# Patient Record
Sex: Female | Born: 1935 | Race: White | Hispanic: No | Smoking: Former smoker
Health system: Southern US, Community
[De-identification: ages and names within clinical notes are randomized; demographics above are authoritative.]

## PROBLEM LIST (undated history)

## (undated) DIAGNOSIS — F32A Depression, unspecified: Secondary | ICD-10-CM

## (undated) DIAGNOSIS — K219 Gastro-esophageal reflux disease without esophagitis: Secondary | ICD-10-CM

## (undated) DIAGNOSIS — F329 Major depressive disorder, single episode, unspecified: Secondary | ICD-10-CM

## (undated) DIAGNOSIS — E079 Disorder of thyroid, unspecified: Secondary | ICD-10-CM

## (undated) DIAGNOSIS — R079 Chest pain, unspecified: Secondary | ICD-10-CM

## (undated) DIAGNOSIS — G4730 Sleep apnea, unspecified: Secondary | ICD-10-CM

## (undated) DIAGNOSIS — G500 Trigeminal neuralgia: Secondary | ICD-10-CM

## (undated) DIAGNOSIS — I10 Essential (primary) hypertension: Secondary | ICD-10-CM

## (undated) HISTORY — DX: Trigeminal neuralgia: G50.0

## (undated) HISTORY — DX: Essential (primary) hypertension: I10

## (undated) HISTORY — DX: Major depressive disorder, single episode, unspecified: F32.9

## (undated) HISTORY — DX: Disorder of thyroid, unspecified: E07.9

## (undated) HISTORY — DX: Chest pain, unspecified: R07.9

## (undated) HISTORY — PX: CATARACT EXTRACTION: SUR2

## (undated) HISTORY — DX: Gastro-esophageal reflux disease without esophagitis: K21.9

## (undated) HISTORY — PX: GALLBLADDER SURGERY: SHX652

## (undated) HISTORY — DX: Sleep apnea, unspecified: G47.30

## (undated) HISTORY — DX: Depression, unspecified: F32.A

## (undated) HISTORY — PX: TOOTH EXTRACTION: SUR596

---
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
## 2000-11-28 HISTORY — PX: TOTAL HIP ARTHROPLASTY: SHX124

## 2001-01-24 ENCOUNTER — Ambulatory Visit (HOSPITAL_COMMUNITY): Admission: RE | Admit: 2001-01-24 | Discharge: 2001-01-24 | Payer: Self-pay | Admitting: Orthopedic Surgery

## 2001-01-24 ENCOUNTER — Encounter: Payer: Self-pay | Admitting: Orthopedic Surgery

## 2001-03-08 ENCOUNTER — Ambulatory Visit (HOSPITAL_COMMUNITY): Admission: RE | Admit: 2001-03-08 | Discharge: 2001-03-08 | Payer: Self-pay | Admitting: Cardiology

## 2001-03-08 ENCOUNTER — Encounter: Payer: Self-pay | Admitting: Cardiology

## 2001-04-30 ENCOUNTER — Encounter: Payer: Self-pay | Admitting: Orthopedic Surgery

## 2001-04-30 ENCOUNTER — Inpatient Hospital Stay (HOSPITAL_COMMUNITY): Admission: RE | Admit: 2001-04-30 | Discharge: 2001-05-05 | Payer: Self-pay | Admitting: Orthopedic Surgery

## 2001-05-03 ENCOUNTER — Encounter: Payer: Self-pay | Admitting: Orthopedic Surgery

## 2002-03-07 ENCOUNTER — Ambulatory Visit (HOSPITAL_COMMUNITY): Admission: RE | Admit: 2002-03-07 | Discharge: 2002-03-07 | Payer: Self-pay | Admitting: Family Medicine

## 2002-03-07 ENCOUNTER — Encounter: Payer: Self-pay | Admitting: Family Medicine

## 2002-05-20 ENCOUNTER — Ambulatory Visit (HOSPITAL_COMMUNITY): Admission: RE | Admit: 2002-05-20 | Discharge: 2002-05-20 | Payer: Self-pay | Admitting: Family Medicine

## 2002-05-20 ENCOUNTER — Encounter: Payer: Self-pay | Admitting: Family Medicine

## 2002-06-07 ENCOUNTER — Ambulatory Visit (HOSPITAL_COMMUNITY): Admission: RE | Admit: 2002-06-07 | Discharge: 2002-06-07 | Payer: Self-pay | Admitting: Otolaryngology

## 2002-06-07 ENCOUNTER — Encounter: Payer: Self-pay | Admitting: Otolaryngology

## 2002-07-08 ENCOUNTER — Encounter: Payer: Self-pay | Admitting: Family Medicine

## 2002-07-08 ENCOUNTER — Ambulatory Visit (HOSPITAL_COMMUNITY): Admission: RE | Admit: 2002-07-08 | Discharge: 2002-07-08 | Payer: Self-pay | Admitting: Family Medicine

## 2002-07-16 ENCOUNTER — Ambulatory Visit (HOSPITAL_COMMUNITY): Admission: RE | Admit: 2002-07-16 | Discharge: 2002-07-16 | Payer: Self-pay | Admitting: Family Medicine

## 2002-07-16 ENCOUNTER — Encounter: Payer: Self-pay | Admitting: Family Medicine

## 2002-09-11 ENCOUNTER — Ambulatory Visit: Admission: RE | Admit: 2002-09-11 | Discharge: 2002-09-11 | Payer: Self-pay | Admitting: Family Medicine

## 2003-02-21 ENCOUNTER — Encounter: Payer: Self-pay | Admitting: Family Medicine

## 2003-02-21 ENCOUNTER — Ambulatory Visit (HOSPITAL_COMMUNITY): Admission: RE | Admit: 2003-02-21 | Discharge: 2003-02-21 | Payer: Self-pay | Admitting: Family Medicine

## 2003-04-18 ENCOUNTER — Encounter: Payer: Self-pay | Admitting: Internal Medicine

## 2003-04-18 ENCOUNTER — Encounter: Admission: RE | Admit: 2003-04-18 | Discharge: 2003-04-18 | Payer: Self-pay | Admitting: Internal Medicine

## 2003-04-23 ENCOUNTER — Encounter: Admission: RE | Admit: 2003-04-23 | Discharge: 2003-04-23 | Payer: Self-pay | Admitting: Internal Medicine

## 2003-04-23 ENCOUNTER — Encounter: Payer: Self-pay | Admitting: Internal Medicine

## 2003-05-08 ENCOUNTER — Encounter: Payer: Self-pay | Admitting: Internal Medicine

## 2003-05-08 ENCOUNTER — Encounter: Admission: RE | Admit: 2003-05-08 | Discharge: 2003-05-08 | Payer: Self-pay | Admitting: Internal Medicine

## 2004-06-07 ENCOUNTER — Encounter (HOSPITAL_COMMUNITY): Admission: RE | Admit: 2004-06-07 | Discharge: 2004-07-07 | Payer: Self-pay | Admitting: Orthopedic Surgery

## 2004-09-28 ENCOUNTER — Encounter: Admission: RE | Admit: 2004-09-28 | Discharge: 2004-09-28 | Payer: Self-pay | Admitting: Internal Medicine

## 2004-10-08 ENCOUNTER — Other Ambulatory Visit: Admission: RE | Admit: 2004-10-08 | Discharge: 2004-10-08 | Payer: Self-pay | Admitting: Internal Medicine

## 2004-10-13 ENCOUNTER — Encounter: Admission: RE | Admit: 2004-10-13 | Discharge: 2004-10-13 | Payer: Self-pay | Admitting: Internal Medicine

## 2004-11-28 HISTORY — PX: KNEE ARTHROPLASTY: SHX992

## 2004-12-08 ENCOUNTER — Observation Stay (HOSPITAL_COMMUNITY): Admission: EM | Admit: 2004-12-08 | Discharge: 2004-12-09 | Payer: Self-pay

## 2004-12-20 ENCOUNTER — Encounter: Admission: RE | Admit: 2004-12-20 | Discharge: 2004-12-20 | Payer: Self-pay | Admitting: Family Medicine

## 2005-01-10 ENCOUNTER — Ambulatory Visit (HOSPITAL_COMMUNITY): Admission: RE | Admit: 2005-01-10 | Discharge: 2005-01-10 | Payer: Self-pay | Admitting: General Surgery

## 2005-01-18 ENCOUNTER — Ambulatory Visit (HOSPITAL_COMMUNITY): Admission: RE | Admit: 2005-01-18 | Discharge: 2005-01-18 | Payer: Self-pay | Admitting: Cardiology

## 2005-02-09 ENCOUNTER — Encounter (INDEPENDENT_AMBULATORY_CARE_PROVIDER_SITE_OTHER): Payer: Self-pay | Admitting: Specialist

## 2005-02-09 ENCOUNTER — Observation Stay (HOSPITAL_COMMUNITY): Admission: RE | Admit: 2005-02-09 | Discharge: 2005-02-10 | Payer: Self-pay | Admitting: General Surgery

## 2005-04-13 ENCOUNTER — Inpatient Hospital Stay (HOSPITAL_COMMUNITY): Admission: RE | Admit: 2005-04-13 | Discharge: 2005-04-16 | Payer: Self-pay | Admitting: Orthopedic Surgery

## 2005-05-09 ENCOUNTER — Encounter (HOSPITAL_COMMUNITY): Admission: RE | Admit: 2005-05-09 | Discharge: 2005-06-08 | Payer: Self-pay | Admitting: Orthopedic Surgery

## 2005-09-07 ENCOUNTER — Ambulatory Visit: Payer: Self-pay | Admitting: Critical Care Medicine

## 2005-09-07 ENCOUNTER — Inpatient Hospital Stay (HOSPITAL_COMMUNITY): Admission: RE | Admit: 2005-09-07 | Discharge: 2005-09-10 | Payer: Self-pay | Admitting: Orthopedic Surgery

## 2005-10-04 ENCOUNTER — Encounter (HOSPITAL_COMMUNITY): Admission: RE | Admit: 2005-10-04 | Discharge: 2005-11-03 | Payer: Self-pay | Admitting: Orthopedic Surgery

## 2005-10-25 ENCOUNTER — Ambulatory Visit (HOSPITAL_COMMUNITY): Admission: RE | Admit: 2005-10-25 | Discharge: 2005-10-25 | Payer: Self-pay | Admitting: Internal Medicine

## 2005-12-15 ENCOUNTER — Encounter (HOSPITAL_COMMUNITY): Admission: RE | Admit: 2005-12-15 | Discharge: 2006-01-14 | Payer: Self-pay | Admitting: Neurology

## 2005-12-20 ENCOUNTER — Encounter: Admission: RE | Admit: 2005-12-20 | Discharge: 2005-12-20 | Payer: Self-pay | Admitting: Neurology

## 2005-12-20 ENCOUNTER — Ambulatory Visit (HOSPITAL_COMMUNITY): Admission: RE | Admit: 2005-12-20 | Discharge: 2005-12-20 | Payer: Self-pay | Admitting: Neurology

## 2005-12-20 ENCOUNTER — Encounter (INDEPENDENT_AMBULATORY_CARE_PROVIDER_SITE_OTHER): Payer: Self-pay | Admitting: Cardiology

## 2006-11-09 ENCOUNTER — Ambulatory Visit (HOSPITAL_COMMUNITY): Admission: RE | Admit: 2006-11-09 | Discharge: 2006-11-09 | Payer: Self-pay | Admitting: Internal Medicine

## 2007-08-09 ENCOUNTER — Ambulatory Visit (HOSPITAL_COMMUNITY): Admission: RE | Admit: 2007-08-09 | Discharge: 2007-08-09 | Payer: Self-pay | Admitting: Internal Medicine

## 2007-08-16 ENCOUNTER — Ambulatory Visit (HOSPITAL_COMMUNITY): Admission: RE | Admit: 2007-08-16 | Discharge: 2007-08-16 | Payer: Self-pay | Admitting: Internal Medicine

## 2007-11-26 ENCOUNTER — Encounter: Admission: RE | Admit: 2007-11-26 | Discharge: 2007-11-26 | Payer: Self-pay | Admitting: Internal Medicine

## 2007-11-29 HISTORY — PX: SHOULDER ARTHROSCOPY: SHX128

## 2008-02-06 ENCOUNTER — Encounter: Admission: RE | Admit: 2008-02-06 | Discharge: 2008-02-06 | Payer: Self-pay | Admitting: Orthopedic Surgery

## 2008-02-28 ENCOUNTER — Encounter: Admission: RE | Admit: 2008-02-28 | Discharge: 2008-02-28 | Payer: Self-pay | Admitting: Orthopedic Surgery

## 2008-03-04 ENCOUNTER — Ambulatory Visit (HOSPITAL_BASED_OUTPATIENT_CLINIC_OR_DEPARTMENT_OTHER): Admission: RE | Admit: 2008-03-04 | Discharge: 2008-03-04 | Payer: Self-pay | Admitting: Orthopedic Surgery

## 2008-03-06 ENCOUNTER — Encounter (HOSPITAL_COMMUNITY): Admission: RE | Admit: 2008-03-06 | Discharge: 2008-04-08 | Payer: Self-pay | Admitting: Orthopedic Surgery

## 2008-11-04 ENCOUNTER — Encounter: Admission: RE | Admit: 2008-11-04 | Discharge: 2008-11-04 | Payer: Self-pay | Admitting: Cardiology

## 2008-11-25 ENCOUNTER — Encounter: Admission: RE | Admit: 2008-11-25 | Discharge: 2008-11-25 | Payer: Self-pay | Admitting: Internal Medicine

## 2009-12-29 ENCOUNTER — Encounter: Admission: RE | Admit: 2009-12-29 | Discharge: 2009-12-29 | Payer: Self-pay | Admitting: Internal Medicine

## 2010-12-16 ENCOUNTER — Other Ambulatory Visit (HOSPITAL_COMMUNITY): Payer: Self-pay | Admitting: Internal Medicine

## 2010-12-16 DIAGNOSIS — Z0000 Encounter for general adult medical examination without abnormal findings: Secondary | ICD-10-CM

## 2010-12-18 ENCOUNTER — Encounter: Payer: Self-pay | Admitting: Internal Medicine

## 2010-12-30 ENCOUNTER — Ambulatory Visit (HOSPITAL_COMMUNITY)
Admission: RE | Admit: 2010-12-30 | Discharge: 2010-12-30 | Disposition: A | Discharge status: Home / Self Care | Payer: Medicare Other | Attending: Internal Medicine | Admitting: Internal Medicine

## 2010-12-30 ENCOUNTER — Ambulatory Visit (HOSPITAL_COMMUNITY)
Admission: RE | Admit: 2010-12-30 | Discharge: 2010-12-30 | Disposition: A | Discharge status: Home / Self Care | Payer: Medicare Other | Source: Ambulatory Visit | Attending: Internal Medicine | Admitting: Internal Medicine

## 2010-12-30 DIAGNOSIS — Z0000 Encounter for general adult medical examination without abnormal findings: Secondary | ICD-10-CM

## 2010-12-30 DIAGNOSIS — Z1231 Encounter for screening mammogram for malignant neoplasm of breast: Secondary | ICD-10-CM | POA: Insufficient documentation

## 2011-02-18 ENCOUNTER — Other Ambulatory Visit: Payer: Self-pay | Admitting: Internal Medicine

## 2011-02-18 DIAGNOSIS — M549 Dorsalgia, unspecified: Secondary | ICD-10-CM

## 2011-02-21 ENCOUNTER — Ambulatory Visit
Admission: RE | Admit: 2011-02-21 | Discharge: 2011-02-21 | Disposition: A | Discharge status: Home / Self Care | Payer: Medicare Other | Source: Ambulatory Visit | Attending: Internal Medicine | Admitting: Internal Medicine

## 2011-02-21 DIAGNOSIS — M549 Dorsalgia, unspecified: Secondary | ICD-10-CM

## 2011-03-16 ENCOUNTER — Ambulatory Visit
Admission: RE | Admit: 2011-03-16 | Discharge: 2011-03-16 | Disposition: A | Discharge status: Home / Self Care | Payer: Medicare Other | Source: Ambulatory Visit | Attending: Internal Medicine | Admitting: Internal Medicine

## 2011-03-16 ENCOUNTER — Other Ambulatory Visit: Payer: Self-pay | Admitting: Internal Medicine

## 2011-03-16 DIAGNOSIS — R1012 Left upper quadrant pain: Secondary | ICD-10-CM

## 2011-04-12 NOTE — Op Note (Signed)
NAMEMALAK, DUCHESNEAU                ACCOUNT NO.:  [id number]   MEDICAL RECORD NO.:  [id number]          PATIENT TYPE:  AMB   LOCATION:  DSC                          FACILITY:  [facility]   PHYSICIAN:  Robert A. Thurston Hole, M.D. DATE OF BIRTH:  May 06, 1928   DATE OF PROCEDURE:  03/04/2008  DATE OF DISCHARGE:                               OPERATIVE REPORT   PREOPERATIVE DIAGNOSES:  1. Right shoulder partial rotator cuff tear and partial labrum tear.  2. Right shoulder chondromalacia/degenerative joint disease.  3. Right shoulder impingement.  4. Right shoulder acromioclavicular joint degenerative joint disease      and spurring.   POSTOPERATIVE DIAGNOSES:  1. Right shoulder partial rotator cuff tear and partial labrum tear.  2. Right shoulder chondromalacia/degenerative joint disease.  3. Right shoulder impingement.  4. Right shoulder acromioclavicular joint degenerative joint disease      and spurring.   PROCEDURES:  1. Right shoulder examination under anesthesia followed by      arthroscopic debridement of partial labrum tear and partial rotator      cuff tear.  2. Right shoulder chondroplasty.  3,  Right shoulder subacromial decompression.  1. Right shoulder distal clavicle excision.   SURGEON:  Elana Alm. Thurston Hole, M.D.   ASSISTANT:  Julien Girt, P.A.   ANESTHESIA:  General.   OPERATIVE TIME:  45 minutes.   COMPLICATIONS:  None.   INDICATIONS FOR PROCEDURE:  Ms. Basques is a 80-year woman who has had 3  months of increasing right shoulder pain with exam and MRI documenting  partial rotator cuff tear, partial labrum tear with chondromalacia,  impingement and AC joint arthropathy.  She has failed conservative care  and is now to undergo arthroscopy.   DESCRIPTION:  Ms. Mcinnis was brought to operating room on March 04, 2008,  placed on the operating table in supine position.  After an adequate  level of general anesthesia was obtained, her right shoulder was  examined.  She had  full range of motion and her shoulder was stable to  ligamentous exam.  The shoulder was sterilely injected with 0.25%  Marcaine with epinephrine.  She was then placed in a beach chair  position and her shoulder and arm were prepped using sterile DuraPrep  and draped using sterile technique.  Originally through a posterior  arthroscopic portal, the arthroscope with a pump attached was placed and  through an anterior portal, an arthroscopic probe was placed.  On  initial inspection the articular cartilage in the glenohumeral joint  showed 30-40% grade 4 and 50% grade 3 changes on both the glenoid and  humeral head, and this was debrided.  She had partial tearing of the  anterior, superior and posterior labrum 30-40%, which was resected back  to a stable rim.  The biceps tendon anchor was slightly hypermobile but  it was otherwise intact.  Biceps tendon was intact.  The anterior  inferior labrum and anterior inferior glenohumeral ligament complex was  intact.  Rotator cuff showed a partial tear 30% of the supraspinatus,  subscapularis and infraspinatus, which was debrided but they were  otherwise well-attached.  The teres minor was intact.  Inferior capsular  recess was free of pathology.  After this was done the subacromial space  was entered and a lateral arthroscopic portal was made.  Moderately  thickened bursitis was resected.  The rotator cuff was frayed and  partially torn on the bursal surface and this was debrided, but no  complete tear was noted.  Impingement was noted and a subacromial  decompression was carried out removing 6-8 mm of the undersurface of the  anterior, anterolateral and anteromedial acromion and CA ligament  release carried out as well.  The [facility] joint showed significant spurring  and degenerative changes and the distal 6 mm of clavicle was resected  with a 6-mm bur.  After this was done the shoulder could be brought  through a full range of motion with no  impingement on the rotator cuff.  At this point it was felt that all pathology had been satisfactorily  addressed.  The instruments were removed.  The portal was closed with 3-  0 nylon suture and injected with 0.25% Marcaine with epinephrine.  Sterile dressings and a sling applied and the patient awakened and taken  to the recovery in stable condition none.   FOLLOW-UP CARE:  Ms. Burnsed will either be followed overnight or  discharged home depending on her level of postoperative oxygenation,  which will be monitored closely by anesthesia in the recovery room.  She  will be discharged on Percocet, Robaxin and Mobic.  Begin early physical  therapy.  She her back in the office in a week for sutures out and  follow-up.      Robert A. Thurston Hole, M.D.  Electronically Signed     RAW/MEDQ  D:  03/04/2008  T:  03/05/2008  Job:  [id number]

## 2011-04-15 NOTE — Discharge Summary (Signed)
[name], [name]                ACCOUNT NO.:  [id number]   MEDICAL RECORD NO.:  [id number]          PATIENT TYPE:  OBV   LOCATION:  3731                         FACILITY:  [facility]   PHYSICIAN:  Theone Stanley, MD   DATE OF BIRTH:  May 09, 1928   DATE OF ADMISSION:  12/07/2004  DATE OF DISCHARGE:  12/09/2004                                 DISCHARGE SUMMARY   ADMITTING DIAGNOSIS:  1.  Atypical chest pain.  2.  Hypothyroidism.  3.  Hypertension.  4.  Depression.  5.  Hyperlipidemia.  6.  Gastroesophageal reflux disease.   DISCHARGE DIAGNOSES:  1.  Atypical chest pain, most likely costochondritis.  2.  Hypothyroidism.  3.  Hypertension.  4.  Depression.  5.  Hyperlipidemia.  6.  Gastroesophageal reflux disease.  7.  Probable left adrenal adenoma.   CONSULTATIONS:  None.   PROCEDURES:  CT of the abdomen, no acute abnormalities, specific no aortic  dissection, cholelithiasis, probable adrenal adenoma.  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  The superior  mesenteric artery and inferior mesenteric artery appear widely patent.  There is a single widely patent right adrenal artery and 2 on the left.  There are scattered calcifications in the descending thoracic aorta and  abdominal aorta.  The common iliacs are patent.  The scan demonstrates a 2.2  cm mass in the superior aspect of the left adrenal gland, probably benign  adenoma.  Both adrenal glands are slightly hypertrophied, probably benign  adrenal hypertrophy.  There are tiny stones in the gallbladder.  There was a  parenchymal spleen and pancreas and kidney demonstrate no significant  abnormality.   CT of the chest showed no acute disease, cardiomegaly, hiatal hernia, no  evidence of aortic dissection or pulmonary embolism.  Diffuse degenerative  joint disease throughout the thoracic and lumbar spine __________ on  reconstructed images.   HOSPITAL COURSE:  [name] is a 77 year old Caucasian female with a past  medical history of anxiety, hepatitis, hypertension, who presents with chest  pain.  In the morning she woke up, started noticing significant chest  discomfort, approximately 5/10, the last episode approximately 20 minutes  ago, located midsternum.  It radiated all along her chest.  Symptoms  resolved and she had no further episodes during the day.  She did well,  tolerating p.o., no shortness of breath, and no chest pain until the evening  of her admission where she had severe mid sternal chest pain, again  radiating to most of her anterior chest including her breasts and nipples.  Initially the pain was 5/10 but the pain persisted and continued to get  worse, to the point where it was 10/10 and caused episodes of nausea and  vomiting.  Vomiting did not alleviate or improve the chest pain in any way.  There was no radiation elsewhere.  On admission to the ER it was noted that  her blood pressure was 189/69.  The rest of the vitals were normal.  Saturation  was 90% on room air.   LABORATORY DATA:  Labs ordered:  Normal white count, no shift, hemoglobin  and hematocrit were stable.  ABGs, electrolytes, BUN, creatinine and lipase  were all normal as well.  CK, CK MB coags were normal.  Chest x-ray was done which was unremarkable except a moderate hiatal hernia.  Subsequent evaluation of CT imaging  of the chest and abdomen were  performed.  Please see results as above.   The patient was admitted for observation.  Repeated cardiac enzymes were  performed.  There was no change.  They were all negative.  Repeat EKG did  not show any changes.  Her pain was relieved by Toradol and it was  reproduced by pressing on her chest.  Because of this, it was felt that this  was most likely costochondritis.  In addition to the hiatal hernia this  might be contributing to her issues.  The patient was  continued on all of  her home medications while in the hospital and no other issues were present.  The patient left the hospital in stable condition.   DISCHARGE MEDICATIONS:  1.  Synthroid 75 mcg 1 p.o. daily.  2.  Zocor 10 mg 1 p.o. daily.  3.  Norvasc 5 mg 1 p.o. daily.  4.  Lexapro 10 mg 1 p.o. daily.   The patient was encouraged to start a PPI, maybe Prilosec over-the-counter  and she has Aleve at home.  She was instructed to take Aleve 1 tab b.i.d.  for 14 days.   DISCHARGE INSTRUCTIONS:  The patient was instructed to continue her pre  hospital diet.  Also if her pain recurs she is to return to the hospital.  She is to follow up with Dr. Donette Larry in 7-10 days.      Atta   AEJ/MEDQ  D:  12/09/2004  T:  12/09/2004  Job:  [id number]   cc:   Georgann Housekeeper, MD  [address]  [place]  [place] [address]  Fax: [phone]

## 2011-04-15 NOTE — Cardiovascular Report (Signed)
NAMESHEY, YOTT                ACCOUNT NO.:  [id number]   MEDICAL RECORD NO.:  [id number]          PATIENT TYPE:  OIB   LOCATION:  2856                         FACILITY:  [facility]   PHYSICIAN:  Francisca December, M.D.  DATE OF BIRTH:  [birth year]   DATE OF PROCEDURE:  01/18/2005  DATE OF DISCHARGE:  01/18/2005                              CARDIAC CATHETERIZATION   PROCEDURES PERFORMED:  1.  Left heart catheterization.  2.  Coronary angiography  3.  Left ventriculogram.   INDICATIONS:  [name] is a 77 year old woman who is preop for a  cholecystectomy. She was admitted to [facility] for sharp anterior  substernal chest pain and rule out for myocardial infarction recently.  A  myocardial perfusion study showed reversible lateral wall defect with an EF  of 60%. She is brought now to the cardiac catheterization laboratory to  identify the extent of disease and provide further therapeutic options.   PROCEDURAL NOTE:  The patient was brought to the cardiac catheterization  laboratory in the fasting state. The right groin was prepped and draped in  usual sterile fashion. Local anesthesia was obtained with the infiltration  of 1% lidocaine. A 5-French catheter sheath was inserted percutaneously into  the right femoral artery utilizing an anterior puncture with guiding J-wire.  A 110 cm pigtail catheter was used to measure pressures in the ascending  aorta and in the left ventricle, both prior to and following the  ventriculogram. A 30 degree RAO cine left ventriculogram was performed  utilizing a power injector; 39 cc of contrast were injected at 13 cc per  second. The 5-French #4 left and right Judkins catheters were then used to  perform cineangiography of each coronary artery. This was performed in  multiple LAO and RAO projections. The patient did receive a 0.2 mg dose  intracoronary nitroglycerin. At the completion of the procedure, the  catheter and catheter sheaths were  removed. Hemostasis was achieved by  direct pressure. The patient was transported to recovery area in stable  condition with an intact distal pulse. It should be noted that all catheter  manipulations were performed using fluoroscopic observation and exchanges  performed over a long guiding J-wire.   HEMODYNAMICS:  Systemic arterial pressure was 159/73 with a mean of 106  mmHg. There was no systolic gradient across the aortic valve. Left  ventricular end-diastolic pressure was 18 mmHg.   ANGIOGRAPHY:  A left ventriculogram demonstrated normal chamber size and  mildly reduced ventricular systolic function. A visual estimate of ejection  fraction is 45-50%. There is focal inferior basal and diaphragmatic  akinesis. There is no significant mitral regurgitation. The aortic valve was  trileaflet and opens normally during systole. No coronary calcification is  seen.   There is a right dominant coronary system present. The main left coronary  was normal.   Left anterior descending artery and its branches were minimally diseased;  there is a 30% tubular stenosis after the origin of a large diagonal branch.  Two diagonal branches arise, the first of which is large, the second of  which is small  to moderate in size. The ongoing anterior descending reaches  and barely traverses the apex.   The left circumflex coronary artery and its branches appear generally  normal. There is a single large marginal which moves inferiorly and  posteriorly and is codominant with another ongoing left circumflex branch  vessel on the obtuse margin of the heart. No significant obstructions are  seen in the left circumflex or its sub-branches.   The right coronary and its branches appear normal. There is a focal  eccentric 30% stenosis 1 cm from the origin which is likely catheter-induced  spasm. The mid and distal portions of the artery show luminal irregularities  only. There is a large posterior descending  artery.  There is a large  posterolateral segment as well. We have two moderate sized left ventricular  branches. No significant structures are seen in the distal branch of the  right coronary   Collateral vessels are not seen.   FINAL IMPRESSION:  1.  Lower limit of normal left ventricular size and systolic function.  2.  Two focal regional wall motion abnormalities, the etiology of which is      uncertain.  3.  Essentially normal coronary arteries. Trivial obstruction in the left      anterior descending artery only is seen.   PLAN AND RECOMMENDATIONS:  Outpatient discharge. The patient should undergo  the gallbladder surgery in the near future with low risk for perioperative  cardiac.      JHE/MEDQ  D:  01/18/2005  T:  01/18/2005  Job:  [id number]   cc:   Georgann Housekeeper, MD  [address]  [place]  [place] [address]  Fax: [phone]   Ollen Gross. Vernell Morgans, M.D.  [address]  [place]  [place] [address]

## 2011-04-15 NOTE — Op Note (Signed)
[name], [name]                ACCOUNT NO.:  [id number]   MEDICAL RECORD NO.:  [id number]          PATIENT TYPE:  AMB   LOCATION:  DAY                          [facility]ACILITY:  [facility]   PHYSICIAN:  Ollen Gross. Vernell Morgans, M.D. DATE OF BIRTH:  10-18-1928   DATE OF PROCEDURE:  02/09/2005  DATE OF DISCHARGE:                                 OPERATIVE REPORT   PREOPERATIVE DIAGNOSIS:  Gallstones.   POSTOPERATIVE DIAGNOSIS:  Gallstones.   PROCEDURE:  Laparoscopic cholecystectomy with intraoperative cholangiogram.   SURGEON:  Ollen Gross. Carolynne Edouard, M.D.   ASSISTANT:  Anselm Pancoast. Zachery Dakins, M.D.   ANESTHESIA:  General endotracheal.   PROCEDURE:  After informed consent was obtained, the patient was brought to  the operating room and placed in a supine position on the operating table.  After induction of general anesthesia, the patient's abdomen was prepped  with Betadine and draped in usual sterile manner.  The area below the  umbilicus was infiltrated 0.25% Marcaine.  A small incision was made with a  15 blade knife; this incision was carried down through the subcutaneous  tissue bluntly with a Kelly clamp and [employer] retractors until the linea  alba was identified.  The linea alba was incised with a 15 blade knife and  each side was grasped with Kocher clamps and elevated anteriorly.  The  preperitoneal space was probed bluntly with a hemostat until the peritoneum  was opened and access was gained to the abdominal cavity.  A 0 Vicryl  pursestring stitch was placed on the fascia surrounding the opening.  A  Hasson cannula was placed through the opening and anchored in place with the  previously placed Vicryl pursestring stitch.  The abdomen was then  insufflated with carbon dioxide without difficulty.  The patient was placed  in a head-up position and rotated slightly with the right side up.  A  laparoscope was inserted through the Hasson cannula and the right upper  quadrant was inspected.  The dome  of gallbladder and liver were readily  identified.  The epigastric region was infiltrated with 0.25% Marcaine.  A  small incision was made with 15 blade knife and a 10-mm port was placed  bluntly through this incision into the abdominal cavity under direct vision.  Sites were then chosen laterally right side of the abdomen for placement of  5-mm ports.  Each of these areas were infiltrated 0.25% Marcaine.  Small  stab incisions were made with a 15 blade knife and 5-mm ports were placed  bluntly through these incisions into the abdominal cavity under direct  vision.  A blunt grasper was placed through the lateral-most 5-mm port and  used to grasp the dome of gallbladder and elevate it anteriorly and  superiorly.  Another blunt grasper was placed through the other 5-mm port  and used to retract on the body and neck of the gallbladder.  A dissector  was placed through the epigastric port and using the electrocautery, the  peritoneal reflection at the gallbladder neck was opened.  Blunt dissection  was then carried out in this area until  the gallbladder neck-cystic duct  junction was readily identified and a good window was created.  A single  clip was placed on the gallbladder neck.  A small ductotomy was made just  below the clip with the laparoscopic scissors.  A 14-gauge Angiocath was  placed percutaneously through the anterior abdominal wall under direct  vision.  A Reddick cholangiogram catheter was placed through the Angiocath  and flushed.  The Reddick catheter then placed within the cystic duct and  anchored in place with a clip. A cholangiogram was obtained that showed no  filling defect, good length on the cystic duct and good emptying into the  duodenum.  The anchoring clip and catheters were removed from the patient.  Three clips were placed proximally on the cystic duct and the duct was  divided between the 2 sets of clips.  Posterior to this, the cystic artery  was identified  again dissected bluntly in a circumferential manner until a  good window was created.  Two clips were placed proximally and one distally  and the artery divided between the two.  Next, a laparoscopic hook cautery  device was used to separate the gallbladder from the liver bed.  Prior to  completely detaching the gallbladder from the liver bed, the liver bed was  inspected and several small bleeding points were coagulated with the  electrocautery.  The gallbladder was then detached the rest of the way from  the liver bed without difficulty.  A laparoscopic bag was placed through the  epigastric port, the gallbladder was placed within the bag and bag was  sealed.  The abdomen was then irrigated with copious amounts of saline until  to the affluent was clear.  The liver bed was inspected again and found to  be hemostatic.  The laparoscope was then moved to the epigastric port and a  gallbladder grasper was placed through the Hasson cannula and used to grasp  the opening in the bag.  The bag with the gallbladder was removed through  the infraumbilical port without difficulty.  The fascial defect was closed  with the previously placed Vicryl pursestring stitch as well as with another  interrupted 0 Vicryl stitch.  The rest of ports were then removed under  direct vision and were found to be hemostatic.  The gas was allowed to  escape.  The skin incisions were all closed with interrupted 4-0 Monocryl  subcuticular stitches. Benzoin, Steri-Strips and sterile dressings were  applied.  The patient tolerated well.  At the end of the case, all needle,  sponge and instrument counts were correct.  The patient was then awaken and  taken to recovery room in stable condition.      PST/MEDQ  D:  02/09/2005  T:  02/09/2005  Job:  [id number]

## 2011-04-15 NOTE — Discharge Summary (Signed)
[name], [name]                ACCOUNT NO.:  [id number]   MEDICAL RECORD NO.:  [id number]          PATIENT TYPE:  INP   LOCATION:  5022                         FACILITY:  [facility]   PHYSICIAN:  Elana Alm. Thurston Hole, M.D. DATE OF BIRTH:  03/15/1928   DATE OF ADMISSION:  09/07/2005  DATE OF DISCHARGE:  09/10/2005                                 DISCHARGE SUMMARY   ADMITTING DIAGNOSES:  1.  End-stage degenerative joint disease, right knee.  2.  Sleep apnea.  3.  Hypertension.   DISCHARGE DIAGNOSES:  1.  End-stage degenerative joint disease, right knee.  2.  Hypertension.  3.  Sleep apnea.  4.  Obesity.  5.  Depression.   HISTORY OF PRESENT ILLNESS:  The patient is a 77 year old white female who  has a history of end-stage DJD of both knees.  She had a left total knee  replacement 5 months ago and did very well with this and now is ready for  her right total knee, having failed conservative care including anti-  inflammatory cortisone injections and viscosupplementation.  She understands  the risks, benefits and possible complications of a right total knee  replacement, as her left one was done 5 months ago.   PROCEDURES IN HOUSE:  On September 07, 2005, the patient underwent a right  total knee replacement by Dr. Thurston Hole and a right femoral nerve block by  Anesthesia; she tolerated both procedures well and was admitted  postoperatively for pain control, DVT prophylaxis and physical therapy.  On  postop day 1, the patient was doing okay.  T-max was 100.2; T on exam was  97.9.  Her pulse was 70.  Her hemoglobin was 10.3.  Her INR was 1.0.  She  was metabolically stable.  Her PCA was discontinued.  She was started on  Percocet for p.o. pain control.  Pulmonary was consulted due to her sleep  apnea.  Postop day 2, the patient continued to improve.  Her hemoglobin was  8.9.  Her INR was 1.8.  She had a small amount of bloody drainage.  Her  surgical wound was well-approximated.  Her  dressing was changed.  Her IV was  discontinued.  Her Foley was discontinued.  Postop day 3, the patient was  progressing slowly.  Her hemoglobin was 8.7.  Her INR was 1.8.  She was  metabolically stable.  She was using her CPAP.  Her dressing was changed.  She was up with Physical Therapy.  Postop day 4, the patient progressed  well.  She was discharged to home in stable condition, weightbearing as  tolerated, on a regular diet, and with her CPAP machine and an albuterol  nebulizer.   DISCHARGE MEDICATIONS:  1.  Percocet one to two q.4-6 h. p.r.n. pain.  2.  Coumadin 5 mg daily.  3.  Lexapro 10 mg daily.  4.  Synthroid 0.075 mg daily.  5.  Alprazolam 0.5 mg one tablet twice a day.  6.  Protonix 40 mg one tablet in the morning.  7.  Meclizine 25 mg half a tablet in the morning.  8.  Norvasc 5 mg one tablet in the morning.   ACTIVITY:  She has been instructed to walk with a walker, ambulating  weightbearing as tolerated, use her CPM 0-90 degrees 8 hours a day, elevate  her right heel on a folded pillow 30 minutes every morning.   WOUND CARE:  She is to change her dressing daily, call with increased  drainage, increased redness, increased swelling or a temperature greater  than 101.   FOLLOWUP:  She will follow up with Dr. Thurston Hole on September 22, 2005.   DIET:  She is on a regular diet.      Kirstin Shepperson, P.A.      Robert A. Thurston Hole, M.D.  Electronically Signed    KS/MEDQ  D:  11/07/2005  T:  11/08/2005  Job:  [id number]

## 2011-04-15 NOTE — Consult Note (Signed)
[name], VECCHIO                ACCOUNT NO.:  [id number]   MEDICAL RECORD NO.:  [id number]          PATIENT TYPE:  INP   LOCATION:  2550                         FACILITY:  [facility]   PHYSICIAN:  Shan Levans, M.D. LHCDATE OF BIRTH:  1928-02-16   DATE OF CONSULTATION:  09/07/2005  DATE OF DISCHARGE:                                   CONSULTATION   CHIEF COMPLAINT:  Evaluate hypoxemia postop.   HISTORY OF PRESENT ILLNESS:  This is a 77 year old obese woman who has had a  right total knee replacement surgery done today.  Postop, she has had  hypoxemia and progressive symptomatology.  We are asked to assist the  patient in her postoperative care.  Past history includes that of morbid  obesity, degenerative joint disease, no history of MI, cardiac  catheterization preop was unremarkable.   ALLERGIES:  Sulfa and morphine.   PAST MEDICAL HISTORY:  History of depression.   PHYSICAL EXAMINATION:  VITAL SIGNS:  Saturation 96% currently on bilevel device,  blood pressure  130/60, pulse 91 with PVCs, respirations 20, temperature 98.  GENERAL:  This is a morbidly obese female in no distress.  CHEST:  Distant breath sounds without evidence of wheeze or rhonchi.  CARDIAC:  Regular rate and rhythm without S3, normal S1 and S2.  ABDOMEN:  Soft, nontender.  EXTREMITIES:  No edema or clubbing.  Right knee showed evidence of knee  replacement with dressings already intact.  SKIN:  Clear.   LABORATORY DATA:  Chest x-ray is pending.  Labs are pending.   IMPRESSION:  Right total knee replacement with obstructive sleep apnea,  postop hypoxia and acute hypoxic respiratory failure.   RECOMMENDATIONS:  Obtain chest x-ray, place on bilevel with 12 of IPAP and 8  of EPAP, back up rate of 10, follow up blood gases, admit to the ICU, give  nebulized Albuterol, optimize respiratory status, avoid PCA of Dilaudid and  utilize p.r.n. Fentanyl with the nurse to determine dosing.      Shan Levans,  M.D. [facility]  Electronically Signed     PW/MEDQ  D:  09/07/2005  T:  09/07/2005  Job:  [id number]

## 2011-04-15 NOTE — Op Note (Signed)
[place]  Patient:    STEPHENI, CAMERON                       MRN: [id number] Proc. Date: 04/30/01 Adm. Date:  [id number] Attending:  Twana First                           Operative Report  PREOPERATIVE DIAGNOSIS:  Left hip degenerative joint disease.  POSTOPERATIVE DIAGNOSIS:  Left hip degenerative joint disease.  PROCEDURE:  Left total hip replacement using [employer]ics total hip system with acetabulum 56 mm Press-Fit PSL cup, with two locking screws and 10-degree polyethylene liner, femoral component #8 EON ODC Plus with 127-degree angle neck with +10 x 28 mm femoral head, with #3 cement plug.  SURGEON:  Elana Alm. Thurston Hole, M.D.  ASSISTANT:  Julien Girt, P.A.  ANESTHESIA:  General.  OPERATIVE TIME:  Three hours.  ESTIMATED BLOOD LOSS:  400 cc.  COMPLICATIONS:  None.  DESCRIPTION OF PROCEDURE:  Ms. Gunnerson was brought to the operating room on April 30, 2001, and placed on the operating room table in the supine position.  After an adequate level of general anesthesia was obtained, she had a Foley catheter placed under sterile conditions.  Her left leg was examined under anesthesia. She had flexion to 90 degrees and extension to 0 degrees, internal and external rotation of 30 degrees.  The left leg was approximately 2.0 cm shorter than the right.  She received Ancef 1 g IV preoperatively for prophylaxis.  She was then turned to the left lateral-up decubitus position and secured on the bed with the Stulberg frame.  The left hip and leg were prepped using sterile Betadine and draped using a sterile technique. Originally through a 25.0 cm posterolateral greater trochanteric incision, initial exposure was made.  The underlying subcutaneous tissues were incised in line with the skin incision.  The iliotibial band and gluteus maximus fascia were incised longitudinally, revealing the underlying sciatic nerve which was carefully protected.   The short external rotators on the posterior hip were released from their femoral neck insertion and tagged, and along with the hip capsule.  The hip was then posteriorly dislocated.  A femoral neck cut was then made 1.5 to 2.0 cm above the lesser trochanter, and with the appropriate amount of anteversion and inclination.  The femoral head was found to be significantly deformed and flattened, with grade 3 and 4 changes.  The acetabulum also had similar changes.  Degenerative labrum was removed from around the acetabulum, and carefully placed retractors were set.  Sequential acetabular reamers were then used to ream up to a 56 mm size, and with the 56 mm trial, this was placed.  There was found to be satisfactory position of the trial with anterior and posterior column fit.  The trial was then removed, and the actual 56 mm PSL cup was placed in the appropriate amount of anteversion and inclination and hammered into position.  It was further secured in place with two separate 20 mm locking screws in the 12 and 11 oclock position. After this was done, then a 10-degree polyethylene liner was placed with the 10-degree lip in the posterolateral position and hammered into position.  The proximal femur was then exposed.  The sequential axial reamers were used to ream up to a #8 size, followed by broaches to a #8 size.  There was a #8 broach in place.  A +0, then +5, then +10 femoral head and neck were placed. The +10 gave the most satisfactory stability, with excellent restoration of leg lengths and stability up to 50-60 degrees of internal rotation in both neutral and 30 degrees of adduction, and also stable in abduction and external rotation.  Intraoperative x-rays were also obtained with this in place, and found to have satisfactory position of the femoral component and the acetabular component.  The femoral trial was then dislocated, and removed.  A femoral canal was measured for a cement plug,  and a #3 cement plug was placed. The femoral canal was then jet-lavaged and irrigated with 3 L of saline solution, and then the femoral canal filled with cement, and then the #8 EON+ stem was placed down the femoral canal, with an excellent fit and held in position until the cement hardened, with the excess cement being removed. After this was done, then a +10 x 28 mm femoral head and neck were hammered onto the femoral component, and the hip reduced and taken through a full range of motion, again stable up to 50-60 degrees of internal rotation, both neutral and 30 degrees of adduction, stable in abduction and external rotation.  The leg lengths were found to be equal.  Another intraoperative x-ray was obtained, again confirming satisfactory position of the prostheses.  At this point then the wound was irrigated and closed, reattaching the hip capsule and short external rotators of the hip to the femoral neck through two drill holes in the greater trochanter.  The iliotibial band and gluteus maximus fascia were closed with #2 Tycron suture.  The subcutaneous tissues were closed with #0 and #2-0 Vicryl.  The skin was closed with skin staples.  Sterile dressings were applied.  A hip abduction pillow was applied.  The patient was turned supine, awakened, and extubated, and taken to the recovery room in stable condition.  The neurovascular status was found to be normal postoperatively. The needle and sponge counts were correct x 2 at the end of the case. DD:  04/30/01 TD:  04/30/01 Job: [id number]

## 2011-04-15 NOTE — Discharge Summary (Signed)
[place]  Patient:    [name], [name]                       MRN: [id number] Adm. Date:  [id number] Disc. Date: [id number] Attending:  Twana First Dictator:   Julien Girt, P.A.                           Discharge Summary  ADMISSION DIAGNOSIS:  Left hip degenerative joint disease.  DISCHARGE DIAGNOSES: 1. Left hip end-stage degenerative joint disease. 2. Chronic obstructive pulmonary disease. 3. Postoperative blood loss anemia. 4. Hypertension. 5. Hypothyroidism.  HISTORY OF PRESENT ILLNESS:  The patient is a 77 year old female with a history of end-stage DJD of her left hip for many years.  She has pain with any motion, unrelieved by anti-inflammatories, intra-articular cortisone injections.  She understands the risks, benefits, and possible complications of a left total hip and is without question.  PROCEDURES:  On April 30, 2001, the patient underwent a left total hip replacement.  She tolerated the procedure well.  HOSPITAL COURSE:  On postoperative day #1 the patient was comfortable.  The pain was well controlled.  She was in normal sinus rhythm.  Hemoglobin 9.7. INR 1.1, and she was metabolically stable.  On postoperative day #2 the patient continues to have pain under control.  She is having some pain, specifically, under her left breast, especially with deep inspiration. Hemoglobin is 9.4.  She has serous drainage from her wound.  BMET is within normal limits.  INR is 1.2.  On postoperative day #3 the pain under her left breast has not subsided.  She still complains of some shortness of breath. ABGs were ordered, and a chest x-ray was ordered.  Chest x-ray came back with basilar atelectasis.  Her pO2 was 54.  A spiral CT was done of her chest and came back negative.  We had an internal medicine consult done for her chest pain.  On postoperative day #4 her chest pain was decreasing.  Hemoglobin was 8.8.  Surgical wound was  well approximated, with serous drainage.  Her INR was 2.3.  Her BMET was within normal limits.  She has a small pericardial effusion attributed to her chest pain, which showed up on the spiral CT.  She was transfused two units of packed red cells with a hemoglobin of 8.8.  On postoperative day #5 her shortness of breath had completely resolved.  She had received medical clearance from internal medicine to discharge to home and wanted to be discharged to home.  She was discharged to home in stable condition.  DISCHARGE MEDICATIONS: 1. Synthroid 0.125 mg 1 p.o. q.d. 2. Reglan 10 mg q.i.d. 3. Celexa 20 mg q.d. 4. Hyzaar 50/12.5 1 q.d. 5. Albuterol nebulizers q.i.d. 6. Percocet 1-2 q.4-6h. p.r.n. pain. 7. Coumadin per pharmacy protocol. 8. Colace 100 mg 1 p.o. b.i.d.  DISCHARGE INSTRUCTIONS:  She is weightbearing as tolerated, on a regular diet.  FOLLOW-UP:  We will see her back in the office for a wound check. DD:  06/15/01 TD:  06/17/01 Job: [id number]

## 2011-04-15 NOTE — H&P (Signed)
[name]                ACCOUNT NO.:  [id number]   MEDICAL RECORD NO.:  [id number]          PATIENT TYPE:  OBV   LOCATION:  1828                         FACILITY:  [facility]   PHYSICIAN:  Hollice Espy, M.D.DATE OF BIRTH:  12-Apr-1928   DATE OF ADMISSION:  12/08/2004  DATE OF DISCHARGE:                                HISTORY & PHYSICAL   PRIMARY CARE PHYSICIAN:  Lilla Shook, M.D.   CHIEF COMPLAINT:  Chest pain.   HISTORY OF PRESENT ILLNESS:  The patient is a 77 year old white female with  a past medical history of anxiety, hypothyroidism, and hypertension, who has  previously had no problems with chest pain.  This morning, she woke up and  started noticing some significant chest discomfort, approximately a 5/10.  The episode lasted approximately 20 minutes, located mid sternally.  It  radiated to involve a good portion of her chest.  The symptoms resolved, and  she had no further episodes during the day.  She did well, tolerating p.o.,  no shortness of breath, and again no chest pain until this evening  approximately 8 o'clock when she stated having severe midsternal chest pain  again which radiated out to involve most of her anterior chest including to  her breasts and nipples.  The patient initially had the pain as 5/10, but  the pain persisted and continued to worsen.  She began to have pain that  finally grew so severe it became a 10/10 and caused episodes of nausea and  vomiting.  The vomiting did not improve or alleviate the chest pain in any  way, and there was some associated shortness of breath.  The pain did not  radiate anywhere else.  There was no radiation to the back or to the arms or  neck or jaw.  The patient's daughter became concerned, as the patient rarely  complains of pain ever, and brought her into the emergency room for further  evaluation.   In the emergency room, the patient was noted to have an elevated blood  pressure of 189/69.  The  rest of her vitals, however, were normal.  She had  a normal heart rate of 65, temperature 97.1, and O2 saturations were 98% on  room air.  Labs were ordered, and the patient had a normal white count, no  shift.  Hemoglobin and hematocrit was stable.  Her ABG, electrolytes, BUN  and creatinine, and lipase levels were all normal, as well.  CPK MB and  coags were normal, as well.  The patient had a chest x-ray done, which was  unremarkable, except for noting a moderate hiatal hernia.  Her abdominal  series films showed only evidence of some moderate stool in the transverse  and ascending colon.  CT of the chest was done to rule out dissection, as  well as any pulmonary embolus.  After speaking with the radiologist, there  is no evidence of either seen.  Currently, the patient states that the pain  has significantly lessened a little while after she received some what  appears to be IV Dilaudid.  She  currently states she is doing well.  She  denies any headaches, acute visual changes, dysphagia.  She has some mild  chest pain, again described as midsternal, radiating to involve most of her  chest.  She has no current shortness of breath; however, she does notice  that when she takes a deep breath, this does affect the pain.  She denies  any abdominal pain, and has not had any hematuria, dysuria, constipation, or  diarrhea.  Her daughter tells me that the patient has had a chronic cough  for the past 2 years.   PAST MEDICAL HISTORY:  1.  Hypothyroidism.  2.  Anxiety.  3.  Hypertension.  4.  Depression.  5.  Hyperlipidemia.  6.  Reportedly GERD.   MEDICATIONS:  1.  Synthroid 75 mcg p.o. daily.  2.  Norvasc 5 p.o. daily.  3.  Lexapro 10 p.o. daily.  4.  Lipitor 5 p.o. daily.  5.  Xanax 0.5 p.o. b.i.d. p.r.n.  6.  Norvasc 5 p.o. daily.  (She previously was on a PPI, but stopped taking it secondary to cost a few  years back.)   ALLERGIES:  SULFA DRUGS.   SOCIAL HISTORY:  She denies  any tobacco, alcohol, or drug use.   FAMILY HISTORY:  Noncontributory.   PHYSICAL EXAMINATION:  VITAL SIGNS:  The patient's vitals on admission  revealed a temperature of 97.1, heart rate 65, blood pressure 189/69,  respirations 24, O2 saturation 98% on room air.  GENERAL:  The patient currently appears to be fatigued but otherwise doing  okay.  In no apparent distress.  Alert and oriented x3.  HEENT:  Normocephalic and atraumatic.  Mucous membranes are slightly dry.  NECK:  She has no carotid bruits.  HEART:  Irregular rate and rhythm with a 2/6 systolic ejection murmur.  LUNGS:  Clear to auscultation bilaterally.  The patient has evidence of  bowel sounds in the chest cavity.  ABDOMEN:  Soft, nontender, nondistended.  Positive bowel sounds.  EXTREMITIES:  No clubbing or cyanosis.  Trace pitting edema.   LABORATORY WORK:  White count of 8.9 with 53% neutrophils.  Hemoglobin and  hematocrit of 13.1 and 38.6.  MCV of 89.  Platelet count of 201.  Sodium  141, potassium 4.1, chloride 109, bicarb 28, BUN 23, creatinine 1, glucose  120, pH of 7.44, PCO2 of 41, bicarb of 27.  LFT's were all within normal  limits.  Lipase is 32.  CPK MB 76.5, and MB less than 1.  Troponin I less  than 0.05.  Coags are all normal.  EKG shows a normal sinus rhythm, and a  low voltage QRS.   ASSESSMENT AND PLAN:  1.  Severe sharp atypical chest pain, sudden onset, starting midsternal and      radiating to involve most of the anterior chest, lasting 4-5 hours.      EKG, enzymes, lab work, chest x-ray, and CT of the chest are all      essentially normal.  IV fluids.  Recheck labs.  I suspect      costochondritis, and will try a course of Toradol.  2.  Hypothyroidism.  She is on Synthroid.  3.  Hypertension.  She is on Norvasc.  4.  Depression.  She is on Lexapro.  5.  Hyperlipidemia.  She is on Lipitor. 6.  Chronic cough with a history of gastroesophageal reflux disease.      Suspect likely that  gastroesophageal reflux      disease  is what is causing the cough.  Will go ahead and start her on a      proton pump inhibitor.  Have recommended that a proton pump inhibitor      with Prilosec over-the-counter is much cheaper, and she may be able to      afford this.  If this does not provide relief, possibly consider hiatal      hernia surgery.      Send   SKK/MEDQ  D:  12/08/2004  T:  12/08/2004  Job:  [id number]

## 2011-04-15 NOTE — Op Note (Signed)
NAMEZULEMA, PULASKI                ACCOUNT NO.:  [id number]   MEDICAL RECORD NO.:  [id number]          PATIENT TYPE:  INP   LOCATION:  2550                         FACILITY:  [facility]   PHYSICIAN:  Robert A. Thurston Hole, M.D. DATE OF BIRTH:  [birth year]   DATE OF PROCEDURE:  04/13/2005  DATE OF DISCHARGE:                                 OPERATIVE REPORT   PREOPERATIVE DIAGNOSIS:  Left knee degenerative joint disease.   POSTOPERATIVE DIAGNOSIS:  Left knee degenerative joint disease.   PROCEDURE:  Left total knee replacement using DePuy cemented total knee  system with #4 cemented femur, #4 cemented tibia with 10 mm polyethylene RP  tibial spacer and 32 mm polyethylene cemented patella.   SURGEON:  Elana Alm. Thurston Hole, M.D.   ASSISTANT:  Julien Girt, P.A.   ANESTHESIA:  General.   OPERATIVE TIME:  One hour and 40 minutes.   COMPLICATIONS:  None.   DESCRIPTION OF PROCEDURE:  Ms. Arline was brought to the operating room on  Apr 13, 2005, after a femoral nerve block had been placed in the holding  room by anesthesia for postoperative pain control.  She was placed on the  operative table in the supine position. She received Ancef 1 g IV  preoperatively for prophylaxis. After being placed under general anesthesia,  her left knee was examined. Range of motion from -10 to 120 degrees - mild  varus deformity, a stable ligamentous exam with normal patellar tracking.  She had a Foley catheter placed under sterile conditions. Her left leg was  then prepped using sterile DuraPrep and draped using sterile technique. Leg  was exsanguinated and a thigh tourniquet elevated to 375 mm. Initially,  through a 15 cm longitudinal incision based over the patella, initial  exposure was made.  The underlying subcutaneous tissues were incised along  with the skin incision. A median arthrotomy was performed revealing an  excessive amount of normal-appearing joint fluid. The articular surfaces  were  inspected. She had grade 4 changes medially, grade 3 changes laterally,  and grade 3 and 4 changes in the patellofemoral joint. The medial and  lateral meniscal remnants were removed as well as the anterior cruciate  ligament. Osteophytes were removed from the femoral condyles and tibial  plateau. Intramedullary drill was then drilled up the femoral canal for  placement of the distal femoral cutting jig which was placed in the  appropriate amount of rotation and a distal 12 mm cut was made. The distal  femur was incised.  A #4 was found to be the appropriate size.  A #4 cutting  jig was placed and then these cuts were made. After this was done, the  proximal tibia was exposed. The tibial spines were removed with an  oscillating saw. Intramedullary drill drilled down the tibial canal for  placement of the proximal tibial cutting jig which was placed in the  appropriate amount of rotation, and a proximal 8 mm cut was made based off  the medial or lower side. After this was done, then the PCL box cutter was  placed back on the distal femur  and this cut was made. After this was done,  then the #4 tibial baseplate trial was placed. The keel cutter was used to  cut the keel and the tibial trial was held into position. The femoral trial  was then placed and, with a 10 mm polyethylene RP tibial spacer, there was  found to be excellent restoration of normal alignment, excellent stability,  range of motion from 0 to 120 degrees. The patella was sized.  A 32 mm was  found to be the appropriate size.  A resurfacing 10 mm cut was made and  three locking holes were placed, and then the 32 mm patella trial was  placed. Patellofemoral tracking was evaluated and this was found to be  normal. At this point, it was felt that all the trial components were of  excellent size, fit, and stability. They were then removed and the knee was  jet lavage irrigated with 3 liters of saline solution. The proximal tibia   was then exposed, and the #4 tibial baseplate with cement backing was  hammered into position with an excellent fit, with excess cement being  removed from around the edges. The #4 femoral component with cement backing  was hammered into position, also with an excellent fit, with excess cement  being removed from around the edges. A 10 mm polyethylene RP tibial spacer  was then placed on the tibial base plate, the knee taken through a range of  motion 0 to 120 degrees with excellent stability, excellent correction of  her varus deformity.  The 32 mm polyethylene cemented patella was then  placed onto its cut surface with three locking holes tightly in position and  held there with a clamp. After the cement hardened, patellofemoral tracking  was evaluated and this was found to be normal. At this point, it was felt  that all the components were of excellent size, fit, and stability. The knee  was further irrigated with saline, and then the arthrotomy was closed with  #1 Ethibond suture over two medium Hemovac drains. Prior to doing this, the  tourniquet was released and small venous bleeders were cauterized.  No  excessive bleeding was noted. Subcutaneous tissues were closed with 0 and 2-  0 Vicryl, subcuticular layer closed with 3-0 Monocryl. Steri-Strips were  applied. Sterile dressings and a long-leg splint were applied. The Hemovac  was injected with 0.25% Marcaine with epinephrine and 4 mg of morphine and  clamped. The patient was then awakened, extubated, and taken to the recovery  in stable condition.  Needle and sponge counts were correct x 2 at the end  of the case.       RAW/MEDQ  D:  04/13/2005  T:  04/13/2005  Job:  [id number]

## 2011-04-15 NOTE — Discharge Summary (Signed)
[name], PAQUETTE                ACCOUNT NO.:  [id number]   MEDICAL RECORD NO.:  [id number]          PATIENT TYPE:  INP   LOCATION:  5028                         FACILITY:  [facility]   PHYSICIAN:  Elana Alm. Thurston Hole, M.D. DATE OF BIRTH:  August 10, 1928   DATE OF ADMISSION:  04/13/2005  DATE OF DISCHARGE:  04/16/2005                                 DISCHARGE SUMMARY   ADMITTING DIAGNOSES:  1.  End-stage degenerative joint disease left knee.  2.  Hypertension.   DISCHARGE DIAGNOSES:  1.  End-stage degenerative joint disease left knee.  2.  Hypertension.  3.  Obesity.  4.  Gastroesophageal reflux.   HISTORY OF PRESENT ILLNESS:  Patient is a 77 year old white female with a  history of end-stage DJD of her left knee.  She has failed conservative care  including anti-inflammatories, cortisone injections, and a debriding  arthroscopy.  She understands the risks, benefits, and possible  complications of a left total knee replacement and is without question.   PROCEDURES IN-HOUSE:  On Apr 13, 2005 patient underwent left total knee  replacement by Dr. Thurston Hole and a femoral nerve block by anesthesia.  She  tolerated both procedures well.  Postoperatively she was admitted for pain  control, DVT prophylaxis, and physical therapy.  Postoperative day one she  was doing well.  She had no complaints.  She was placed on 2900 due to her  sleep apnea, not using a CPAP.  Overnight stay was uneventful.  Her  hemoglobin was 10.3.  Her INR was 0.9.  Her BMET was within normal limits.  She was set up on a CPAP machine per respiratory.  Telemetry was  discontinued.  PCA was discontinued.  She was transferred to a regular bed.  Postoperative day two patient was doing well.  She had no complaints.  Hemoglobin was 9.9.  INR was 1.1.  She was metabolically stable.  O2  saturations were 95% on 2 L.  She tolerated her CPAP well.  Postoperative  day three patient was metabolically stable.  Had a hemoglobin of 9.1.   Blood  pressure was 140/70.  O2 saturations were 95% on room air.  Surgical wound  was well approximated.  She was discharged to home in stable condition,  weightbearing as tolerated on a regular diet.   DISCHARGE MEDICATIONS:  1.  Percocet 5/325 one to two q.4-6h. p.r.n. pain.  2.  Robaxin 500 mg one tablet a day.  3.  Coumadin 7.5 mg one tablet a day.  4.  Synthroid 75 mcg one tablet a day.  5.  Protonix 40 mg one tablet a day.  6.  Norvasc 5 mg one tablet a day.  7.  Meclizine 25 mg half a tablet t.i.d.  8.  Xanax 0.5 mg one tablet twice a day as needed.  9.  Lexapro 10 mg one tablet a day.  10. Colace 100 mg one tablet twice a day.  11. Senokot two tablets before dinner.   She is to use her CPM eight hours a day, elevate her left heel on a folded  pillow 30 minutes every  morning.  She has been instructed to keep her wound  clean and dry.  May shower on May 24 if her wound is clean and dry and no  drainage.  She is to call with increased pain, increased redness, increased  swelling, or a temperature greater than 101.  Follow-up appointment is May  30 at 9:45 a.m.       KS/MEDQ  D:  06/20/2005  T:  06/20/2005  Job:  [id number]

## 2011-04-15 NOTE — Op Note (Signed)
NAMEZAINAB, CRUMRINE                ACCOUNT NO.:  [id number]   MEDICAL RECORD NO.:  [id number]          PATIENT TYPE:  INP   LOCATION:  2301                         FACILITY:  [facility]   PHYSICIAN:  Elana Alm. Thurston Hole, M.D. DATE OF BIRTH:  1928/11/21   DATE OF PROCEDURE:  09/07/2005  DATE OF DISCHARGE:                                 OPERATIVE REPORT   PREOPERATIVE DIAGNOSIS:  Right knee degenerative joint disease.   POSTOPERATIVE DIAGNOSIS:  Right knee degenerative joint disease.   PROCEDURE:  Right total knee replacement using DePuy cemented total knee  system with number 4 cemented femur, number 3 cemented tibia, with 10 mm  polyethylene RPT tibial spacer, and 32 mm polyethylene cemented patella.   SURGEON:  Elana Alm. Thurston Hole, M.D.   ASSISTANT:  Julien Girt, P.A.   ANESTHESIA:  General.   OPERATIVE TIME:  1 hour 45 minutes.   COMPLICATIONS:  None.   DESCRIPTION OF PROCEDURE:  Ms. Kruser was brought to the operating room on  September 07, 2005, after femoral nerve block was done in the holding room by  anesthesia.  She received Ancef 1 gram IV preoperatively for prophylaxis.  After being placed under general anesthesia, she had a Foley catheter placed  under sterile conditions.  Her right knee was examined under anesthesia,  range of motion from -5 to 125 degrees and a stable ligamentous exam with  mild varus deformity with normal patellar tracking.  The right leg was  prepped using sterile DuraPrep and draped using a sterile technique.  The  leg was exsanguinated and a thigh tourniquet elevated to 385 mm.  Initially,  through a 15 cm longitudinal incision based over the patella, initial  exposure was made.  The underlying subcutaneous tissues were incised along  with the skin incision.  A median arthrotomy was performed revealing an  excessive amount of normal appearing joint fluid.  The articular surfaces  were inspected.  She had grade 4 changes medially, grade 3 changes  laterally, and grade 3 and 4 changes in the patellofemoral joint.  The  medial and lateral meniscal remnants were removed as well as the anterior  cruciate ligament.  Osteophytes were removed off the femoral condyles and  tibial plateau.  After this was done, an intramedullary drill was drilled up  the femoral canal for placement of the distal femoral cutting jig which was  placed in the appropriate amount of rotation and a distal 11 mm cut was  made.  The distal femur was then sized.  A number 4 was found to be the  appropriate size.  A number 4 cutting jig was placed and then these cuts  were made.  The proximal tibia was then exposed.  The tibial spines were  removed with an oscillating saw.  The intramedullary drill drilled down the  tibial canal for placement of the proximal tibial cutting jig which was  placed in the appropriate amount of rotation and a proximal 6 mm cut was  made based off the medial or lower side.  At this point, the spacer blocks  were placed in  flexion and extension, 10 mm blocks gave excellent balancing  and excellent correction of her flexion and varus deformities.  At this  point, then, the number 3 tibial baseplate trial was placed and then the  keel cut was made.  After this was done, then the PCL box cutter was placed  on the distal femur and these cuts were made.  At this point, the number 4  femoral trial was placed, the number 3 tibial baseplate trial was placed,  and with the 10 mm polyethylene RP tibial spacer, the knee was reduced,  taken through a range of motion from 0 to 125 degrees with excellent  stability and excellent correction of her flexion and varus deformity.  Patellar tracking was also found to be normal.  At this point, the patella  was sized and measured 22 mm in depth and a resurfacing 8 mm cut was made  and three locking holes were placed for a 32 mm size patella which was then  placed.  Again, patellofemoral tracking was evaluated and  this was found to  be normal.  At this point, it was felt that all the trial components were of  excellent size, fit, and stability.  They were then removed and the knee was  jet lavage irrigated with 3 liters of saline solution.  The proximal tibia  was then exposed and the number 3 tibial baseplate with cement backing was  hammered into position with an excellent fit with excess cement being  removed from around the edges, the number 4 femoral component with cement  backing was hammered into position also with an excellent fit with excess  cement being removed from around the edges.  The 10 mm polyethylene RP  tibial spacer was placed on the tibial baseplate.  The knee was reduced and  taken through a range of motion, 0 to 125 degrees with excellent stability  and excellent correction of her flexion and varus deformities.  The 32 mm  polyethylene cement back patella was then placed into its position and held  there with a clamp.  After the cement hardened, patellofemoral tracking was  again evaluated and this was found to be normal.  At this point, it was felt  that all the components were of excellent size, fit, and stability.  The  knee was further irrigated.  The tourniquet was released, hemostasis was  obtained with cautery.  The arthrotomy was closed with number 1 Ethibond  suture over two medium Hemovac drains.  The subcutaneous tissues were closed  with 0 and 2-0 Vicryl, the subcuticular layer closed with 4-0 Monocryl,  Steri-Strips were applied, sterile dressings were applied.  The Hemovac was  injected with 0.25% Marcaine with epinephrine and a long leg splint applied.  The patient was then awakened, extubated, and taken to the recovery room in  stable condition.  Needle and sponge counts were correct x 2 at the end of  the case.      Robert A. Thurston Hole, M.D.  Electronically Signed     RAW/MEDQ  D:  09/07/2005  T:  09/07/2005  Job:  [id number]

## 2011-08-23 LAB — BASIC METABOLIC PANEL
CO2: 28
Calcium: 9.1
GFR calc Af Amer: 49 — ABNORMAL LOW
GFR calc non Af Amer: 41 — ABNORMAL LOW
Sodium: 139

## 2011-12-12 ENCOUNTER — Other Ambulatory Visit (HOSPITAL_COMMUNITY): Payer: Self-pay | Admitting: Internal Medicine

## 2011-12-12 DIAGNOSIS — Z139 Encounter for screening, unspecified: Secondary | ICD-10-CM

## 2012-01-02 ENCOUNTER — Ambulatory Visit (HOSPITAL_COMMUNITY)
Admission: RE | Admit: 2012-01-02 | Discharge: 2012-01-02 | Disposition: A | Discharge status: Home / Self Care | Payer: Medicare Other | Source: Ambulatory Visit | Attending: Internal Medicine | Admitting: Internal Medicine

## 2012-01-02 DIAGNOSIS — Z1231 Encounter for screening mammogram for malignant neoplasm of breast: Secondary | ICD-10-CM | POA: Insufficient documentation

## 2012-01-02 DIAGNOSIS — Z139 Encounter for screening, unspecified: Secondary | ICD-10-CM

## 2015-04-24 ENCOUNTER — Other Ambulatory Visit: Payer: Self-pay | Admitting: Internal Medicine

## 2015-04-24 DIAGNOSIS — R4189 Other symptoms and signs involving cognitive functions and awareness: Secondary | ICD-10-CM

## 2015-05-03 ENCOUNTER — Ambulatory Visit
Admission: RE | Admit: 2015-05-03 | Discharge: 2015-05-03 | Disposition: A | Discharge status: Home / Self Care | Payer: Medicare Other | Source: Ambulatory Visit | Attending: Internal Medicine | Admitting: Internal Medicine

## 2015-05-03 DIAGNOSIS — R4189 Other symptoms and signs involving cognitive functions and awareness: Secondary | ICD-10-CM

## 2015-05-06 ENCOUNTER — Encounter: Payer: Self-pay | Admitting: Neurology

## 2015-05-06 ENCOUNTER — Ambulatory Visit (INDEPENDENT_AMBULATORY_CARE_PROVIDER_SITE_OTHER): Payer: Medicare Other | Admitting: Neurology

## 2015-05-06 VITALS — BP 118/72 | HR 79 | Resp 16 | Wt 257.0 lb

## 2015-05-06 DIAGNOSIS — Z8673 Personal history of transient ischemic attack (TIA), and cerebral infarction without residual deficits: Secondary | ICD-10-CM | POA: Diagnosis not present

## 2015-05-06 DIAGNOSIS — R404 Transient alteration of awareness: Secondary | ICD-10-CM | POA: Diagnosis not present

## 2015-05-06 DIAGNOSIS — F329 Major depressive disorder, single episode, unspecified: Secondary | ICD-10-CM | POA: Diagnosis not present

## 2015-05-06 DIAGNOSIS — F32A Depression, unspecified: Secondary | ICD-10-CM

## 2015-05-06 NOTE — Progress Notes (Signed)
NEUROLOGY CONSULTATION NOTE  [name] MRN: [id number] DOB: 09-Aug-1928  Referring provider: Dr. Kirby FunkJohn Castaneda Primary care provider: Dr. Kirby FunkJohn Castaneda  Reason for consult:  Episodes of staring  Dear Dr Valentina LucksGriffin:  Thank you for your kind referral of [name] for consultation of the above symptoms. Although her history is well known to you, please allow me to reiterate it for the purpose of our medical record. The patient was accompanied to the clinic by her 2  daughters who also provide collateral information. Records and images were personally reviewed where available.  HISTORY OF PRESENT ILLNESS: This is an 81 year old right-handed woman with a history of hypertension, hypothyroidism, depression, presenting with recurrent episodes of staring that occur after chest pain. She states that symptoms started a year ago, she would start having chest pain, and if she does not get the sublingual nitroglycerin soon enough, she feels that she cannot speak or move and feels confused for a few minutes. She would feel sick to her stomach and clammy, then diffusely weak briefly after, the pain gets better, then she is back to baseline. Episodes were occurring once a week, initially these were thought to be due to GERD, however her daughters witnessed one episode while they were having a meal, she was sitting then her head, jaw, and arms dropped down, she started asking for her pocketbook, stating "I need my pill." Her family then witnessed her to become unresponsive, "frozen in time," eyes open and not blinking. No convulsive activity or twitching/jerking noted. They were able to get the nitro in her mouth, then she slowly came to, but felt short of breath. She did not recall much of the episode. She denies any palpitations but states she sometimes feels diaphoretic. Episodes can occur while sitting or standing, mostly at night.   She denies any headaches, diplopia, dysarthria, dysphagia, neck pain,  focal numbness/tingling/weakness. She is "dizzy all the time" for the past year, worse when she gets up quickly or moves her head quickly, "I get drunk." She is careful when bending. She describes the dizziness as spinning, no nausea. She has been dealing with depression for the past 6-7 months and was started on Zoloft this week. 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 She has chronic back pain and urinary incontinence. She reports memory changes and feels "confused all the time, I can't remember anything." She does not drive. She denies any olfactory/gustatory hallucinations, deja vu. Her daughter, brother, and maternal uncle had childhood epilepsy that they outgrew. She had a normal birth and early development.  There is no history of febrile convulsions, CNS infections such as meningitis/encephalitis, significant traumatic brain injury, neurosurgical procedures.  I personally reviewed MRI brain without contrast done 05/03/15 which did not show any acute changes. 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 Compared to MRI brain done 2007, the infarcts were not seen in this study.  PAST MEDICAL HISTORY: Past Medical History  Diagnosis Date  . Hypertension   . Thyroid disease   . Depression   . Chest pain   . GERD (gastroesophageal reflux disease)   . Sleep apnea     PAST SURGICAL HISTORY: Past Surgical History  Procedure Laterality Date  . Total hip arthroplasty  2002     left  . Knee arthroplasty  2006    left   . Knee arthroplasty  2006    right   .  Shoulder arthroscopy  2009    right  . Cataract extraction      bilateral  . Gallbladder surgery    . Appendectomy  1946    MEDICATIONS: No current outpatient prescriptions on file prior to visit.   No current facility-administered medications on file prior to visit.    ALLERGIES: Allergies  Allergen Reactions  . Gabapentin  Other (See Comments)    Alters mood  . Lyrica [Pregabalin] Other (See Comments)    Lethargic, mood changes  . Morphine And Related Hives and Itching  . Lipitor [Atorvastatin] Itching and Rash  . Sulfa Antibiotics Rash    FAMILY HISTORY: Family History  Problem Relation Age of Onset  . Diabetes Mother   . Stroke Mother   . Cancer Brother   . Kidney disease Sister     SOCIAL HISTORY: History   Social History  . Marital Status: Widowed    Spouse Name: N/A  . Number of Children: 4  . Years of Education: N/A   Occupational History  . Retired    Social History Main Topics  . Smoking status: Former Games developer  . Smokeless tobacco: Never Used  . Alcohol Use: No  . Drug Use: No  . Sexual Activity: Not on file   Other Topics Concern  . Not on file   Social History Narrative    REVIEW OF SYSTEMS: Constitutional: No fevers, chills, or sweats, no generalized fatigue, change in appetite Eyes: No visual changes, double vision, eye pain Ear, nose and throat: No hearing loss, ear pain, nasal congestion, sore throat Cardiovascular: + chest pain,no palpitations Respiratory:  No shortness of breath at rest or with exertion, wheezes GastrointestinaI: No nausea, vomiting, diarrhea, abdominal pain, fecal incontinence Genitourinary:  No dysuria, urinary retention or frequency Musculoskeletal:  No neck pain,+back pain Integumentary: No rash, pruritus, skin lesions Neurological: as above Psychiatric: + depression, insomnia, anxiety Endocrine: No palpitations, fatigue, diaphoresis, mood swings, change in appetite, change in weight, increased thirst Hematologic/Lymphatic:  No anemia, purpura, petechiae. Allergic/Immunologic: no itchy/runny eyes, nasal congestion, recent allergic reactions, rashes  PHYSICAL EXAM: Filed Vitals:   05/06/15 1351  BP: 118/72  Pulse: 79  Resp: 16   General: No acute distress, flat affect, appears depressed Head:  Normocephalic/atraumatic Eyes:  Fundoscopic exam shows bilateral sharp discs, no vessel changes, exudates, or hemorrhages Neck: supple, no paraspinal tenderness, full range of motion Back: No paraspinal tenderness Heart: regular rate and rhythm Lungs: Clear to auscultation bilaterally. Vascular: No carotid bruits. Skin/Extremities: No rash, no edema Neurological Exam: Mental status: alert and oriented to person, place, and time, no dysarthria or aphasia, Fund of knowledge is appropriate.  Recent and remote memory are intact. 3/3 delayed recall. Attention and concentration are normal.    Able to name objects and repeat phrases. Cranial nerves: CN I: not tested CN II: pupils equal, round and reactive to light, visual fields intact, fundi unremarkable. CN III, IV, VI:  full range of motion, no nystagmus, no ptosis CN V: facial sensation intact CN VII: upper and lower face symmetric CN VIII: hearing intact to finger rub CN IX, X: gag intact, uvula midline CN XI: sternocleidomastoid and trapezius muscles intact CN XII: tongue midline Bulk & Tone: normal, no fasciculations. Motor: 5/5 throughout with no pronator drift. Sensation: intact to light touch, cold, pin, vibration and joint position sense.  No extinction to double simultaneous stimulation.  Romberg test negative Deep Tendon Reflexes: +2 throughout, no ankle clonus Plantar responses: downgoing bilaterally Cerebellar: no incoordination on  finger to nose, heel to shin. No dysdiadochokinesia Gait: slow and cautious, no ataxia, mild difficulty with tandem walk but able Tremor: none  IMPRESSION: This is an 81 year old right-handed woman with a history of hypertension, hypothyroidism, depression, presenting with a 1-year history of recurrent episodes of chest pain, followed by behavioral and speech arrest if she does not get the nitroglycerin soon enough. She feels fatigued after, and reports being amnestic of them. The etiology of symptoms is unclear, seizures are  considered, however the history is somewhat atypical. She always has chest pain prior, would recommend Cardiology evaluation as well. Recommend an echocardiogram as part of stroke workup, the left occipital infarct occurred in between the MRI done in 2016 and 2007. She is asymptomatic from the strokes. Would recommend a daily baby aspirin for secondary stroke prevention, however the patient tells me she was told not to take aspirin due to kidney problems. She will discuss this with her PCP. Other consideration is non-epileptic events. Routine EEG will be ordered, if normal, a 24-hour EEG will be done to further classify her symptoms. She knows to go to ER immediately if symptoms worsen.   driving laws were discussed with the patient, and she knows to stop driving after an episode of loss of awareness, until 6 months event-free. She does not drive. She will follow-up in 2 months.   Thank you for allowing me to participate in the care of this patient. Please do not hesitate to call for any questions or concerns.   Patrcia Dolly, M.D.  CC: Dr. Valentina Lucks

## 2015-05-06 NOTE — Patient Instructions (Signed)
1. Schedule routine EEG. If this is normal, we will plan for 24-hour EEG 2. Discuss Cardiology evaluation with your PCP 3. Discuss starting daily baby aspirin for stroke prevention 4. Continue control of BP, cholesterol 5. If symptoms worsen, go to ER immediately 6. Follow-up in 2 months

## 2015-05-07 ENCOUNTER — Encounter: Payer: Self-pay | Admitting: Neurology

## 2015-05-07 DIAGNOSIS — R404 Transient alteration of awareness: Secondary | ICD-10-CM | POA: Insufficient documentation

## 2015-05-07 DIAGNOSIS — Z8673 Personal history of transient ischemic attack (TIA), and cerebral infarction without residual deficits: Secondary | ICD-10-CM | POA: Insufficient documentation

## 2015-06-08 ENCOUNTER — Ambulatory Visit (INDEPENDENT_AMBULATORY_CARE_PROVIDER_SITE_OTHER): Payer: Medicare Other | Admitting: Neurology

## 2015-06-08 DIAGNOSIS — R404 Transient alteration of awareness: Secondary | ICD-10-CM | POA: Diagnosis not present

## 2015-06-09 ENCOUNTER — Telehealth: Payer: Self-pay | Admitting: Family Medicine

## 2015-06-09 DIAGNOSIS — R404 Transient alteration of awareness: Secondary | ICD-10-CM

## 2015-06-09 NOTE — Procedures (Signed)
ELECTROENCEPHALOGRAM REPORT  Date of Study: 06/08/2015  Patient's Name: Mickie KayGracie B Callanan MRN: [id number] Date of Birth: 1927-12-11  Referring Provider: Dr. Patrcia DollyKaren Tarri Guilfoil  Clinical History: This is an [age] year old woman with recurrent episodes of chest pain, followed by behavioral and speech arrest   Medications: No anti-epileptic medications  Technical Summary: A multichannel digital EEG recording measured by the international 10-20 system with electrodes applied with paste and impedances below 5000 ohms performed in our laboratory with EKG monitoring in an awake and asleep patient.  Hyperventilation was not performed. Photic stimulation was performed.  The digital EEG was referentially recorded, reformatted, and digitally filtered in a variety of bipolar and referential montages for optimal display.    Description: The patient is awake and asleep during the recording.  During maximal wakefulness, there is a symmetric, medium voltage 9 Hz posterior dominant rhythm that attenuates with eye opening.  The record is symmetric.  During drowsiness and sleep, there is an increase in theta slowing of the background, with shifting asymmetry seen over the bilateral temporal regions.  Vertex waves and symmetric sleep spindles were seen. Photic stimulation did not elicit any abnormalities.  There were no epileptiform discharges or electrographic seizures seen.    EKG lead showed occasional irregular rhythm.  Impression: This awake and asleep EEG is within normal limits for age.  Clinical Correlation: A normal EEG does not exclude a clinical diagnosis of epilepsy.  If further clinical questions remain, prolonged EEG may be helpful.  Clinical correlation is advised.   Patrcia DollyKaren Rilee Knoll, M.D.

## 2015-06-09 NOTE — Telephone Encounter (Signed)
-----   Message from Van ClinesKaren M Aquino, MD sent at 06/09/2015  1:12 PM EDT ----- Pls let her know EEG is normal, would proceed with 24-hour EEG as discussed. Thanks

## 2015-06-09 NOTE — Telephone Encounter (Signed)
Patient was notified of results. She is agreeable to 24 hour eeg. Will give order to Darl PikesSusan to call patient to schedule.

## 2015-06-11 ENCOUNTER — Ambulatory Visit (INDEPENDENT_AMBULATORY_CARE_PROVIDER_SITE_OTHER): Payer: Medicare Other | Admitting: Neurology

## 2015-06-11 DIAGNOSIS — R404 Transient alteration of awareness: Secondary | ICD-10-CM

## 2015-07-06 ENCOUNTER — Encounter: Payer: Self-pay | Admitting: Neurology

## 2015-07-06 ENCOUNTER — Ambulatory Visit (INDEPENDENT_AMBULATORY_CARE_PROVIDER_SITE_OTHER): Payer: Medicare Other | Admitting: Neurology

## 2015-07-06 VITALS — BP 122/76 | HR 68 | Resp 18 | Wt 252.0 lb

## 2015-07-06 DIAGNOSIS — R404 Transient alteration of awareness: Secondary | ICD-10-CM

## 2015-07-06 DIAGNOSIS — R079 Chest pain, unspecified: Secondary | ICD-10-CM | POA: Diagnosis not present

## 2015-07-06 NOTE — Progress Notes (Signed)
NEUROLOGY FOLLOW UP OFFICE NOTE  [name] [id number]  HISTORY OF PRESENT ILLNESS: I had the pleasure of seeing [name] in follow-up in the neurology clinic on 07/06/2015.  The patient was last seen 2 months ago and is again accompanied by her 2 daughters who help supplement the history today.  Records and images were personally reviewed where available.  She was seen for recurrent episodes of chest pain, shortness of breath, and feeling sick, she feels she cannot speak or move and feels confused. Her daughters noted one episode where she became briefly unresponsive. Since her last visit, she had a 24-hour EEG which was normal. She reported typical symptoms of feeling dizzy, nauseated, short of breath, headaches, with no associated EEG change. Her 1-lead EKG showed irregular rhythm throughout the recording, no change in baseline rhythm noted during these events. Her daughters deny any further episodes of unresponsiveness, however she continues to have the chest pain, sick, sweaty, nauseated feeling 2-3 times a week. She had 3 this past week, last was yesterday at [facility]. She was able to speak without confusion and again took her Nitro, which causes headaches.  HPI: This is an 81 yo RH woman with a history of hypertension, hypothyroidism, depression, who presented for evaluation of recurrent episodes of staring that occur after chest pain. She states that symptoms started a year ago, she would start having chest pain, and if she does not get the sublingual nitroglycerin soon enough, she feels that she cannot speak or move and feels confused for a few minutes. She would feel sick to her stomach and clammy, then diffusely weak briefly after, the pain gets better, then she is back to baseline. Episodes were occurring once a week, initially these were thought to be due to GERD, however her daughters witnessed one episode while they were having a meal, she was sitting then her head, jaw, and arms  dropped down, she started asking for her pocketbook, stating "I need my pill." Her family then witnessed her to become unresponsive, "frozen in time," eyes open and not blinking. No convulsive activity or twitching/jerking noted. They were able to get the nitro in her mouth, then she slowly came to, but felt short of breath. She did not recall much of the episode. She denies any palpitations but states she sometimes feels diaphoretic. Episodes can occur while sitting or standing, mostly at night.   She denies any headaches, diplopia, dysarthria, dysphagia, neck pain, focal numbness/tingling/weakness. She is "dizzy all the time" for the past year, worse when she gets up quickly or moves her head quickly, "I get drunk." She is careful when bending. She describes the dizziness as spinning, no nausea. She has been dealing with depression for the past 6-7 months and was started on Zoloft this week. 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 She has chronic back pain and urinary incontinence. She reports memory changes and feels "confused all the time, I can't remember anything." She does not drive. She denies any olfactory/gustatory hallucinations, deja vu. Her daughter, brother, and maternal uncle had childhood epilepsy that they outgrew. She had a normal birth and early development. There is no history of febrile convulsions, CNS infections such as meningitis/encephalitis, significant traumatic brain injury, neurosurgical procedures.  Diagnostic Data: I personally reviewed MRI brain without contrast done 05/03/15 which did not show any acute changes. 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 Compared to  MRI brain done 2007, the infarcts were not seen in this study. Routine and 24-hour EEG normal. Typical episodes of chest pain, shortness of breath, nausea did not show any EEG change.  PAST MEDICAL HISTORY: Past  Medical History  Diagnosis Date  . Hypertension   . Thyroid disease   . Depression   . Chest pain   . GERD (gastroesophageal reflux disease)   . Sleep apnea     MEDICATIONS: Current Outpatient Prescriptions on File Prior to Visit  Medication Sig Dispense Refill  . allopurinol (ZYLOPRIM) 100 MG tablet Take  Tablets daily    . calcium citrate-vitamin D (CITRACAL+D) 315-200 MG-UNIT per tablet Take 1 tablet by mouth daily.    . furosemide (LASIX) 80 MG tablet Take 80 mg by mouth 2 (two) times daily.    Marland Kitchen levothyroxine (SYNTHROID, LEVOTHROID) 137 MCG tablet Take 137 mcg by mouth daily before breakfast.    . LORazepam (ATIVAN) 1 MG tablet Take 1 mg by mouth at bedtime.    Marland Kitchen losartan (COZAAR) 100 MG tablet Take 100 mg by mouth daily.    . Misc Natural Products (GLUCOSAMINE CHONDROITIN VIT D3) CAPS Take by mouth daily.    . Multiple Vitamins-Minerals (WOMENS ONE DAILY) TABS Take by mouth daily.    . nitroGLYCERIN (NITROSTAT) 0.4 MG SL tablet Place 0.4 mg under the tongue every 5 (five) minutes as needed for chest pain.    Marland Kitchen omeprazole (PRILOSEC) 40 MG capsule Take 40 mg by mouth daily.    . sertraline (ZOLOFT) 50 MG tablet Take 50 mg by mouth daily.     No current facility-administered medications on file prior to visit.    ALLERGIES: Allergies  Allergen Reactions  . Gabapentin Other (See Comments)    Alters mood  . Lyrica [Pregabalin] Other (See Comments)    Lethargic, mood changes  . Morphine And Related Hives and Itching  . Lipitor [Atorvastatin] Itching and Rash  . Sulfa Antibiotics Rash    FAMILY HISTORY: Family History  Problem Relation Age of Onset  . Diabetes Mother   . Stroke Mother   . Cancer Brother   . Kidney disease Sister     SOCIAL HISTORY: History   Social History  . Marital Status: Widowed    Spouse Name: N/A  . Number of Children: 4  . Years of Education: N/A   Occupational History  . Retired    Social History Main Topics  . Smoking status:  Former Games developer  . Smokeless tobacco: Never Used  . Alcohol Use: No  . Drug Use: No  . Sexual Activity: Not on file   Other Topics Concern  . Not on file   Social History Narrative    REVIEW OF SYSTEMS: Constitutional: No fevers, chills, or sweats, no generalized fatigue, change in appetite Eyes: No visual changes, double vision, eye pain Ear, nose and throat: No hearing loss, ear pain, nasal congestion, sore throat Cardiovascular: +chest pain, no palpitations Respiratory:  No shortness of breath at rest or with exertion, wheezes GastrointestinaI: No nausea, vomiting, diarrhea, abdominal pain, fecal incontinence Genitourinary:  No dysuria, urinary retention or frequency Musculoskeletal:  No neck pain, back pain Integumentary: No rash, pruritus, skin lesions Neurological: as above Psychiatric: + depression, insomnia, anxiety Endocrine: No palpitations, fatigue, diaphoresis, mood swings, change in appetite, change in weight, increased thirst Hematologic/Lymphatic:  No anemia, purpura, petechiae. Allergic/Immunologic: no itchy/runny eyes, nasal congestion, recent allergic reactions, rashes  PHYSICAL EXAM: Filed Vitals:   07/06/15 1038  BP: 122/76  Pulse: 68  Resp: 18   General: No acute distress Head:  Normocephalic/atraumatic Neck: supple, no paraspinal tenderness, full range of motion Heart:  Regular rate and rhythm Lungs:  Clear to auscultation bilaterally Back: No paraspinal tenderness Skin/Extremities: No rash, no edema Neurological Exam: alert and oriented to person, place, and time. No aphasia or dysarthria. Fund of knowledge is appropriate.  Recent and remote memory are intact.  Attention and concentration are normal.    Able to name objects and repeat phrases. Cranial nerves: Pupils equal, round, reactive to light.  Fundoscopic exam unremarkable, no papilledema. Extraocular movements intact with no nystagmus. Visual fields full. Facial sensation intact. No facial  asymmetry. Tongue, uvula, palate midline.  Motor: Bulk and tone normal, muscle strength 5/5 throughout with no pronator drift.  Sensation to light touch intact.  No extinction to double simultaneous stimulation.  Deep tendon reflexes 2+ throughout, toes downgoing.  Finger to nose testing intact.  Gait slow and cautious, no ataxia, mild difficulty with tandem walk (similar to prior. Romberg negative.  IMPRESSION: This is an 81 yo RH woman with a history of hypertension, hypothyroidism, depression, with a 1-year history of recurrent episodes of chest pain, followed by behavioral and speech arrest if she does not get the nitroglycerin soon enough. She feels fatigued after, and reports being amnestic of them. Brain MRI did not show any acute changes. Her 24-hour EEG captured several typical episodes of chest pain, nausea, shortness of breath, which did not show any EEG change. Findings were discussed with the patient and her family, no neurological cause for her symptoms found. With the chest pain and shortness of breath, as well as EKG lead on her ambulatory EEG showing irregular rhythm, we again discussed a Cardiology evaluation. She will be referred to Cardiology. Continue current medications. She knows to go to ER immediately if symptoms worsen. She is aware of [place] driving laws to stop driving after an episode of loss of awareness, until 6 months event-free. She does not drive. She will follow-up on a prn basis and knows to call our office for any change in symptoms.   Thank you for allowing me to participate in her care.  Please do not hesitate to call for any questions or concerns.  The duration of this appointment visit was 14 minutes of face-to-face time with the patient.  Greater than 50% of this time was spent in counseling, explanation of diagnosis, planning of further management, and coordination of care.   Patrcia Dolly, M.D.   CC: Dr. Valentina Lucks

## 2015-07-06 NOTE — Procedures (Signed)
ELECTROENCEPHALOGRAM REPORT  Dates of Recording: 06/11/2015 to 06/12/2015  Patient's Name: [name] MRN: [id number] Date of Birth: [birth year]  Referring Provider: Dr. Patrcia Dolly  Procedure: 24-hour ambulatory EEG  History: This is an 81 year old woman with a recurrent episodes of chest pain, followed by behavioral and speech arrest if she does not get the nitroglycerin soon enough. She feels fatigued after, and reports being amnestic of them  Medications: Zoloft, Lasix, Synthroid, Cozaar  Technical Summary: This is a 24-hour multichannel digital EEG recording measured by the international 10-20 system with electrodes applied with paste and impedances below 5000 ohms performed as portable with EKG monitoring.  The digital EEG was referentially recorded, reformatted, and digitally filtered in a variety of bipolar and referential montages for optimal display.    DESCRIPTION OF RECORDING: During maximal wakefulness, the background activity consisted of a symmetric 9 Hz posterior dominant rhythm which was reactive to eye opening.  There were no epileptiform discharges or focal slowing seen in wakefulness.  During the recording, the patient progresses through wakefulness, drowsiness, and Stage 2 sleep. There is a single sharp transient seen over the left temporal region, of unclear clinical significance.  Again, there were no clear epileptiform discharges seen.  Events: Patient reports feeling dizzy at onset of recording. Electrographically, there were no EEG or EKG changes seen.  On 07/14 at 1630 hours, she reports feeling dizzy and nausea. Electrographically, there were no EEG or EKG changes seen.  On 07/14 at 1645 hours, she reports pain in legs and headaches. Electrographically, there were no EEG or EKG changes seen.  On 07/14 at 1700 hours, she reports walking and feeling short of breath. Electrographically, there were no EEG or EKG changes seen.  On 07/14 at 2100 hours, she  reports feeling dizzy, short of breath, cough. Electrographically, there were no EEG or EKG changes seen.  On 07/15 at 0300 hours, she reports feeling dizzy, short of breath, cough. Electrographically, there were no EEG or EKG changes seen.  On 07/15 at 1000 hours, she reports headache and feeling short of breath. Electrographically, there were no EEG or EKG changes seen.  There were no electrographic seizures seen.  EKG lead showed irregular rhythm.   IMPRESSION: This 24-hour ambulatory EEG study is within normal limits.  CLINICAL CORRELATION: A normal EEG does not exclude a clinical diagnosis of epilepsy.  Typical events were not captured. Episodes of dizziness, feeling short of breath, and headaches, did not show any electrographic correlate. If further clinical questions remain, inpatient video EEG monitoring may be helpful.   Patrcia Dolly, M.D.

## 2015-07-06 NOTE — Addendum Note (Signed)
Addended by: Franciso Bend on: 07/06/2015 03:50 PM   Modules accepted: Orders

## 2015-07-06 NOTE — Patient Instructions (Signed)
1. Refer to Cardiology for episodes of chest pain, shortness of breath.  2. Follow-up on as needed basis, call for any change in symptoms

## 2015-07-07 ENCOUNTER — Telehealth: Payer: Self-pay | Admitting: Family Medicine

## 2015-07-07 NOTE — Telephone Encounter (Signed)
Called patient to give appt information for Cardiology appt. She is scheduled with Dr. Anne Fu @ [facility] on 08/20/15 @ 11:30 am.

## 2015-08-20 ENCOUNTER — Ambulatory Visit (INDEPENDENT_AMBULATORY_CARE_PROVIDER_SITE_OTHER): Payer: Medicare Other | Admitting: Cardiology

## 2015-08-20 ENCOUNTER — Encounter: Payer: Self-pay | Admitting: Cardiology

## 2015-08-20 VITALS — BP 128/74 | HR 62 | Ht 64.0 in | Wt 252.8 lb

## 2015-08-20 DIAGNOSIS — R55 Syncope and collapse: Secondary | ICD-10-CM

## 2015-08-20 DIAGNOSIS — R079 Chest pain, unspecified: Secondary | ICD-10-CM

## 2015-08-20 NOTE — Progress Notes (Signed)
Cardiology Office Note   Date:  08/20/2015   ID:  [name], DOB May 21, 1928, MRN [id number]  PCP:  Lillia Mountain, MD  Cardiologist:   Donato Schultz, MD       History of Present Illness: [name] is a 81 y.o. female here for evaluation of chest discomfort. In review of notes from neurology on 07/06/15, she's had recurrent episodes of chest pain, shortness of breath, feeling sick, cannot speak or move and feels confused. Her daughters noticed an episode where she became replete unresponsive. A 24 hour EEG was normal. She has symptoms, dizziness, nausea, short of breath, headaches. EKG showed an irregular rhythm through recording, no change in baseline rhythm during these events.  She had an episode once at church where she was able to speak without confusion and took the nitroglycerin-headache ensued. She has had episodes that occur once a week initially thought to be GERD. She was sitting with daughters at a meal and her head, jaw and arms dropped down. She asked for her pocket book stating I need my pill. She was frozen in time, eyes open and not blinking. They were able to get in nitroglycerin in her mouth and she slowly came to. She did not really recall the episode all that well. She has stated that she is "dizzy all of the time ". If she turns her head quickly I get drunk she states. Neurologic workup has been unremarkable, she's had 2 separate brain MRIs, normal.  Daughter wife of [name] who died of metastatic cancer, patient of mine. Chest pain stabbing, NTG.  Pale, nausea, weak for a day after.   Past Medical History  Diagnosis Date  . Hypertension   . Thyroid disease   . Depression   . Chest pain   . GERD (gastroesophageal reflux disease)   . Sleep apnea     Past Surgical History  Procedure Laterality Date  . Total hip arthroplasty  2002     left  . Knee arthroplasty  2006    left   . Knee arthroplasty  2006    right   . Shoulder arthroscopy  2009      right  . Cataract extraction      bilateral  . Gallbladder surgery    . Appendectomy  1946     Current Outpatient Prescriptions  Medication Sig Dispense Refill  . allopurinol (ZYLOPRIM) 100 MG tablet Take  Tablets daily    . calcium citrate-vitamin D (CITRACAL+D) 315-200 MG-UNIT per tablet Take 1 tablet by mouth daily.    . furosemide (LASIX) 80 MG tablet Take 80 mg by mouth 2 (two) times daily.    Marland Kitchen levothyroxine (SYNTHROID, LEVOTHROID) 137 MCG tablet Take 137 mcg by mouth daily before breakfast.    . LORazepam (ATIVAN) 1 MG tablet Take 1 mg by mouth at bedtime.    Marland Kitchen losartan (COZAAR) 100 MG tablet Take 100 mg by mouth daily.    . Misc Natural Products (GLUCOSAMINE CHONDROITIN VIT D3) CAPS Take by mouth daily.    . Multiple Vitamins-Minerals (WOMENS ONE DAILY) TABS Take by mouth daily.    . nitroGLYCERIN (NITROSTAT) 0.4 MG SL tablet Place 0.4 mg under the tongue every 5 (five) minutes as needed for chest pain.    Marland Kitchen omeprazole (PRILOSEC) 40 MG capsule Take 40 mg by mouth daily.    . sertraline (ZOLOFT) 50 MG tablet Take 50 mg by mouth daily.     No current facility-administered medications for  this visit.    Allergies:   Gabapentin; Lyrica; Morphine and related; Lipitor; and Sulfa antibiotics    Social History:  The patient  reports that she has quit smoking. She has never used smokeless tobacco. She reports that she does not drink alcohol or use illicit drugs.   Family History:  The patient's family history includes Cancer in her brother; Diabetes in her mother; Kidney disease in her sister; Stroke in her mother.    ROS:  Please see the history of present illness.   Otherwise, review of systems are positive for none.   All other systems are reviewed and negative.    PHYSICAL EXAM: VS:  BP 128/74 mmHg  Pulse 62  Ht  (1.626 m)  Wt 252 lb 12.8 oz (114.669 kg)  BMI 43.37 kg/m2 , BMI Body mass index is 43.37 kg/(m^2). GEN: Well nourished, well developed, in no acute  distress HEENT: normal Neck: no JVD, carotid bruits, or masses Cardiac: Regularly irregular (atrial bigeminy noted on EKG); no obvious murmurs, rubs, or gallops, trivial edema  Respiratory:  clear to auscultation bilaterally, normal work of breathing GI: soft, nontender, nondistended, + BS obese MS: no deformity or atrophy Skin: warm and dry, no rash Neuro:  Strength and sensation are intact Psych: euthymic mood, full affect   EKG:  EKG today 08/18/15 shows sinus rhythm with atrial bigeminy, premature atrial contractions. Heart rate overall 62 bpm, overall low voltage QRS.   Recent Labs: No results found for requested labs within last 365 days.    Lipid Panel No results found for: CHOL, TRIG, HDL, CHOLHDL, VLDL, LDLCALC, LDLDIRECT    Wt Readings from Last 3 Encounters:  08/20/15 252 lb 12.8 oz (114.669 kg)  07/06/15 252 lb (114.306 kg)  05/06/15 257 lb (116.574 kg)      Other studies Reviewed: Additional studies/ records that were reviewed today include: Lab work, EKG, office notes, hospital records. Review of the above records demonstrates: As above   ASSESSMENT AND PLAN:  1.  Chest pain-difficult to pull all of her symptoms together however she does complain of chest discomfort and has taken nitroglycerin with some relief. I will pursue pharmacologic stress testing. Pain has some atypical quality suet such as a stabbing phenomenon.  2. Near syncope-she has the spells of loss of awareness. She does have atrial bigeminy on her EKG. If she did have a lengthy pause, I would expect loss of consciousness completely however her family states that she has not had complete loss of consciousness. I would like to check a 30 day event monitor to make sure we do not see any adverse arrhythmias. I also wonder if there may be a vagal component to her symptoms, she does have some associated nausea and feels quite "washed out" after.  3. Morbid Obesity-encourage weight loss.  4. Neurologic  workup with MRI, EEG was unremarkable and reassuring.   Current medicines are reviewed at length with the patient today.  The patient does not have concerns regarding medicines.  The following changes have been made:  no change  Labs/ tests ordered today include:   Orders Placed This Encounter  Procedures  . Myocardial Perfusion Imaging  . Cardiac event monitor  . EKG 12-Lead  . ECHOCARDIOGRAM COMPLETE     Disposition:  [place] in 6 weeks  Signed, Donato Schultz, MD  08/20/2015 1:06 PM    [facility] [address] [place], [place], [place]  [address] Phone: [phone]; Fax: [phone]

## 2015-08-20 NOTE — Patient Instructions (Signed)
Medication Instructions:  Your physician recommends that you continue on your current medications as directed. Please refer to the Current Medication list given to you today.  Labwork: none  Testing/Procedures: Your physician has requested that you have a lexiscan myoview. For further information please visit [email or web address] Please follow instruction sheet, as given.  Your physician has requested that you have an echocardiogram. Echocardiography is a painless test that uses sound waves to create images of your heart. It provides your doctor with information about the size and shape of your heart and how well your heart's chambers and valves are working. This procedure takes approximately one hour. There are no restrictions for this procedure.  Your physician has recommended that you wear an event monitor. Event monitors are medical devices that record the heart's electrical activity. Doctors most often [place] these monitors to diagnose arrhythmias. Arrhythmias are problems with the speed or rhythm of the heartbeat. The monitor is a small, portable device. You can wear one while you do your normal daily activities. This is usually used to diagnose what is causing palpitations/syncope (passing out).  Follow-Up: Your physician recommends that you schedule a follow-up appointment in: 6 week ov

## 2015-08-24 ENCOUNTER — Telehealth (HOSPITAL_COMMUNITY): Payer: Self-pay | Admitting: *Deleted

## 2015-08-24 NOTE — Telephone Encounter (Signed)
Patient given detailed instructions per Myocardial Perfusion Study Information Sheet for test on 08/26/15 at 1230. Patient notified to arrive 15 minutes early and that it is imperative to arrive on time for appointment to keep from having the test rescheduled.  If you need to cancel or reschedule your appointment, please call the office within 24 hours of your appointment. Failure to do so may result in a cancellation of your appointment, and a $50 no show fee. Patient verbalized understanding. Hasspacher, Adelene Idler

## 2015-08-26 ENCOUNTER — Ambulatory Visit (HOSPITAL_COMMUNITY): Payer: Medicare Other | Attending: Cardiology

## 2015-08-26 DIAGNOSIS — R0609 Other forms of dyspnea: Secondary | ICD-10-CM | POA: Insufficient documentation

## 2015-08-26 DIAGNOSIS — R079 Chest pain, unspecified: Secondary | ICD-10-CM

## 2015-08-26 DIAGNOSIS — I10 Essential (primary) hypertension: Secondary | ICD-10-CM | POA: Insufficient documentation

## 2015-08-26 DIAGNOSIS — R9439 Abnormal result of other cardiovascular function study: Secondary | ICD-10-CM | POA: Diagnosis not present

## 2015-08-26 MED ORDER — TECHNETIUM TC 99M SESTAMIBI GENERIC - CARDIOLITE
33.0000 | Freq: Once | INTRAVENOUS | Status: AC | PRN
  Administered 2015-08-26: 33 via INTRAVENOUS

## 2015-08-26 MED ORDER — REGADENOSON 0.4 MG/5ML IV SOLN
0.4000 mg | Freq: Once | INTRAVENOUS | Status: AC
  Administered 2015-08-26: 0.4 mg via INTRAVENOUS

## 2015-08-27 ENCOUNTER — Ambulatory Visit (HOSPITAL_COMMUNITY): Payer: Medicare Other | Attending: Cardiology

## 2015-08-27 DIAGNOSIS — R0989 Other specified symptoms and signs involving the circulatory and respiratory systems: Secondary | ICD-10-CM

## 2015-08-27 DIAGNOSIS — I517 Cardiomegaly: Secondary | ICD-10-CM | POA: Insufficient documentation

## 2015-08-27 DIAGNOSIS — I10 Essential (primary) hypertension: Secondary | ICD-10-CM | POA: Insufficient documentation

## 2015-08-27 DIAGNOSIS — R079 Chest pain, unspecified: Secondary | ICD-10-CM | POA: Insufficient documentation

## 2015-08-27 DIAGNOSIS — I351 Nonrheumatic aortic (valve) insufficiency: Secondary | ICD-10-CM | POA: Insufficient documentation

## 2015-08-27 DIAGNOSIS — I340 Nonrheumatic mitral (valve) insufficiency: Secondary | ICD-10-CM | POA: Insufficient documentation

## 2015-08-27 DIAGNOSIS — Z6841 Body Mass Index (BMI) 40.0 and over, adult: Secondary | ICD-10-CM | POA: Insufficient documentation

## 2015-08-27 LAB — MYOCARDIAL PERFUSION IMAGING
CHL CUP NUCLEAR SDS: 0
CHL CUP NUCLEAR SRS: 0
CHL CUP NUCLEAR SSS: 0
CSEPPHR: 74 {beats}/min
LHR: 0.34
LV dias vol: 110 mL
LVSYSVOL: 49 mL
Rest HR: 60 {beats}/min
TID: 1.09

## 2015-08-27 MED ORDER — TECHNETIUM TC 99M SESTAMIBI GENERIC - CARDIOLITE
32.0000 | Freq: Once | INTRAVENOUS | Status: AC | PRN
  Administered 2015-08-27: 32 via INTRAVENOUS

## 2015-08-28 ENCOUNTER — Ambulatory Visit (HOSPITAL_BASED_OUTPATIENT_CLINIC_OR_DEPARTMENT_OTHER): Payer: Medicare Other

## 2015-08-28 ENCOUNTER — Ambulatory Visit: Payer: Medicare Other

## 2015-08-28 ENCOUNTER — Other Ambulatory Visit: Payer: Self-pay | Admitting: Cardiology

## 2015-08-28 ENCOUNTER — Other Ambulatory Visit: Payer: Self-pay

## 2015-08-28 DIAGNOSIS — Z6841 Body Mass Index (BMI) 40.0 and over, adult: Secondary | ICD-10-CM | POA: Diagnosis not present

## 2015-08-28 DIAGNOSIS — R079 Chest pain, unspecified: Secondary | ICD-10-CM | POA: Diagnosis not present

## 2015-08-28 DIAGNOSIS — I10 Essential (primary) hypertension: Secondary | ICD-10-CM | POA: Diagnosis not present

## 2015-08-28 DIAGNOSIS — I340 Nonrheumatic mitral (valve) insufficiency: Secondary | ICD-10-CM | POA: Diagnosis not present

## 2015-08-28 DIAGNOSIS — I351 Nonrheumatic aortic (valve) insufficiency: Secondary | ICD-10-CM | POA: Diagnosis not present

## 2015-08-28 DIAGNOSIS — R55 Syncope and collapse: Secondary | ICD-10-CM

## 2015-08-28 DIAGNOSIS — I517 Cardiomegaly: Secondary | ICD-10-CM | POA: Diagnosis not present

## 2015-09-01 ENCOUNTER — Ambulatory Visit (INDEPENDENT_AMBULATORY_CARE_PROVIDER_SITE_OTHER): Payer: Medicare Other

## 2015-09-01 DIAGNOSIS — R55 Syncope and collapse: Secondary | ICD-10-CM | POA: Diagnosis not present

## 2015-09-01 DIAGNOSIS — R079 Chest pain, unspecified: Secondary | ICD-10-CM

## 2015-10-07 ENCOUNTER — Encounter: Payer: Self-pay | Admitting: Cardiology

## 2015-10-07 ENCOUNTER — Ambulatory Visit (INDEPENDENT_AMBULATORY_CARE_PROVIDER_SITE_OTHER): Payer: Medicare Other | Admitting: Cardiology

## 2015-10-07 VITALS — BP 126/84 | HR 44 | Ht 64.0 in | Wt 255.8 lb

## 2015-10-07 DIAGNOSIS — R55 Syncope and collapse: Secondary | ICD-10-CM

## 2015-10-07 DIAGNOSIS — R0789 Other chest pain: Secondary | ICD-10-CM | POA: Diagnosis not present

## 2015-10-07 NOTE — Patient Instructions (Signed)
Medication Instructions:  The current medical regimen is effective;  continue present plan and medications.  Follow-Up: Follow up as needed.  If you need a refill on your cardiac medications before your next appointment, please call your pharmacy.  Thank you for choosing [facility]!!     

## 2015-10-07 NOTE — Progress Notes (Signed)
Cardiology Office Note   Date:  10/07/2015   ID:  [name], DOB 06-Jul-1928, MRN [id number]  PCP:  Lillia Mountain, MD  Cardiologist:   Donato Schultz, MD     History of Present Illness: [name] is a 81 y.o. female here for follow-up of evaluation of chest discomfort, staring episodes.   Overall her nuclear stress test, echocardiogram, event monitor were all reassuring. See below for details.  In review of notes from neurology on 07/06/15, she's had recurrent episodes of chest pain, shortness of breath, feeling sick, cannot speak or move and feels confused. Her daughters noticed an episode where she became replete unresponsive. A 24 hour EEG was normal. She has symptoms, dizziness, nausea, short of breath, headaches. EKG showed an irregular rhythm through recording, no change in baseline rhythm during these events.  She had an episode once at church where she was able to speak without confusion and took the nitroglycerin-headache ensued. She has had episodes that occur once a week initially thought to be GERD. She was sitting with daughters at a meal and her head, jaw and arms dropped down. She asked for her pocket book stating I need my pill. She was frozen in time, eyes open and not blinking. They were able to get in nitroglycerin in her mouth and she slowly came to. She did not really recall the episode all that well. She has stated that she is "dizzy all of the time ". If she turns her head quickly I get drunk she states. Neurologic workup has been unremarkable, she's had 2 separate brain MRIs, normal.  Daughter wife of [name] who died of metastatic cancer, patient of mine. Chest pain stabbing, NTG.  Pale, nausea, weak for a day after.   Past Medical History  Diagnosis Date  . Hypertension   . Thyroid disease   . Depression   . Chest pain   . GERD (gastroesophageal reflux disease)   . Sleep apnea     Past Surgical History  Procedure Laterality Date  . Total  hip arthroplasty  2002     left  . Knee arthroplasty  2006    left   . Knee arthroplasty  2006    right   . Shoulder arthroscopy  2009    right  . Cataract extraction      bilateral  . Gallbladder surgery    . Appendectomy  1946     Current Outpatient Prescriptions  Medication Sig Dispense Refill  . allopurinol (ZYLOPRIM) 100 MG tablet Take  Tablets daily    . calcium citrate-vitamin D (CITRACAL+D) 315-200 MG-UNIT per tablet Take 1 tablet by mouth daily.    . furosemide (LASIX) 80 MG tablet Take 80 mg by mouth 2 (two) times daily.    Marland Kitchen levothyroxine (SYNTHROID, LEVOTHROID) 137 MCG tablet Take 137 mcg by mouth daily before breakfast.    . LORazepam (ATIVAN) 1 MG tablet Take 1 mg by mouth at bedtime.    Marland Kitchen losartan (COZAAR) 100 MG tablet Take 100 mg by mouth daily.    . Misc Natural Products (GLUCOSAMINE CHONDROITIN VIT D3) CAPS Take by mouth daily.    . Multiple Vitamins-Minerals (WOMENS ONE DAILY) TABS Take by mouth daily.    . nitroGLYCERIN (NITROSTAT) 0.4 MG SL tablet Place 0.4 mg under the tongue every 5 (five) minutes as needed for chest pain.    Marland Kitchen omeprazole (PRILOSEC) 40 MG capsule Take 40 mg by mouth daily.    . sertraline (  ZOLOFT) 50 MG tablet Take 50 mg by mouth daily.     No current facility-administered medications for this visit.    Allergies:   Gabapentin; Lyrica; Morphine and related; Lipitor; and Sulfa antibiotics    Social History:  The patient  reports that she has quit smoking. She has never used smokeless tobacco. She reports that she does not drink alcohol or use illicit drugs.   Family History:  The patient's family history includes Cancer in her brother; Diabetes in her mother; Kidney disease in her sister; Stroke in her mother.    ROS:  Please see the history of present illness.   Otherwise, review of systems are positive for none.   All other systems are reviewed and negative.    PHYSICAL EXAM: VS:  BP 126/84 mmHg  Pulse 44  Ht 5\' 4"  (1.626 m)  Wt  255 lb 12.8 oz (116.03 kg)  BMI 43.89 kg/m2  SpO2 96% , BMI Body mass index is 43.89 kg/(m^2). GEN: Well nourished, well developed, in no acute distress HEENT: normal Neck: no JVD, carotid bruits, or masses Cardiac: Regularly irregular (atrial bigeminy noted on EKG); no obvious murmurs, rubs, or gallops, trivial edema  Respiratory:  clear to auscultation bilaterally, normal work of breathing GI: soft, nontender, nondistended, + BS obese MS: no deformity or atrophy Skin: warm and dry, no rash Neuro:  Strength and sensation are intact Psych: euthymic mood, full affect   EKG:  EKG today 08/18/15 shows sinus rhythm with atrial bigeminy, premature atrial contractions. Heart rate overall 62 bpm, overall low voltage QRS.   Recent Labs: No results found for requested labs within last 365 days.    Lipid Panel No results found for: CHOL, TRIG, HDL, CHOLHDL, VLDL, LDLCALC, LDLDIRECT    Wt Readings from Last 3 Encounters:  10/07/15 255 lb 12.8 oz (116.03 kg)  08/26/15 252 lb (114.306 kg)  08/20/15 252 lb 12.8 oz (114.669 kg)    NUC Stress: 08/27/15:   The left ventricular ejection fraction is normal (55-65%).   Nuclear stress EF: 56%.   There was no ST segment deviation noted during stress.   Defect 1: There is a medium defect of moderate severity present in the basal inferolateral, mid inferolateral and apical lateral location.   Findings consistent with prior myocardial infarction.   This is a low risk study.  Old inferolateral scar without significant reversibility. No ischemia. LV systolic function has improved since prior study in 2002 when EF was 46%.  Overall reassuring NUC stress test.  Donato SchultzSKAINS, MARK, MD  ECHO: 08/28/15: - Left ventricle: The cavity size was normal. Wall thickness wasincreased in a pattern of moderate LVH. Systolic function wasnormal. The estimated ejection fraction was in the range of 55%to 60%. Doppler parameters are consistent with  abnormal leftventricular relaxation (grade 1 diastolic dysfunction). Dopplerparameters are consistent with both elevated ventricularend-diastolic filling pressure and elevated left atrial fillingpressure. - Aortic valve: Calcified non coronary cusp sclerosis no stenosis There was mild regurgitation. - Mitral valve: There was mild regurgitation. - Left atrium: The atrium was moderately dilated. - Atrial septum: No defect or patent foramen ovale was identified. - Pulmonary arteries: PA peak pressure: 32 mm Hg (S).  Event monitor: 09/01/15    PVC's and PAC's noted.   No adverse arrhythmias  Other studies Reviewed: Additional studies/ records that were reviewed today include: Lab work, EKG, office notes, hospital records. Review of the above records demonstrates: As above   ASSESSMENT AND PLAN:  1.  Chest pain-overall  reassuring nuclear stress test. I doubt that she actually has an inferolateral scar. This is likely attenuation artifact. Her EKG does not show this nor does her echocardiogram show any evidence of old infarct. No ischemia. Difficult to pull all of her symptoms together however she does complain of chest discomfort and has taken nitroglycerin with some relief. Pain has some atypical quality suet such as a stabbing phenomenon. Pain could be musculoskeletal. Pain could be esophageal. She has tried long-acting nitrate, 1 pill but she had a headache. I'm fine with her trying nitroglycerin as needed.  2. Near syncope-she has the spells of loss of awareness. She does have atrial bigeminy on her EKG. 30 day event monitor did not show any adverse arrhythmias. Only isolated PVCs or PACs. Reassurance. No pulses.. I also wonder if there may be a vagal component to her symptoms, she does have some associated nausea and feels quite "washed out" after. In all, the staring, unresponsive episodes to me seem like partial seizures however neurology has assessed her Foley.  3. Morbid  Obesity-encourage weight loss.  4. Neurologic workup with MRI, EEG was unremarkable and reassuring.   Current medicines are reviewed at length with the patient today.  The patient does not have concerns regarding medicines.  The following changes have been made:  no change  Labs/ tests ordered today include:   None  Disposition:  [place] PRN  Signed, Donato Schultz, MD  10/07/2015 2:25 PM    [facility] [address] [place], [place], [place]  [address] Phone: [phone]; Fax: [phone]

## 2016-02-22 ENCOUNTER — Other Ambulatory Visit: Payer: Self-pay | Admitting: Internal Medicine

## 2016-02-22 ENCOUNTER — Ambulatory Visit
Admission: RE | Admit: 2016-02-22 | Discharge: 2016-02-22 | Disposition: A | Discharge status: Home / Self Care | Payer: Medicare Other | Source: Ambulatory Visit | Attending: Internal Medicine | Admitting: Internal Medicine

## 2016-02-22 DIAGNOSIS — R059 Cough, unspecified: Secondary | ICD-10-CM

## 2016-02-22 DIAGNOSIS — R05 Cough: Secondary | ICD-10-CM

## 2016-03-06 ENCOUNTER — Emergency Department
Admission: EM | Admit: 2016-03-06 | Discharge: 2016-03-06 | Disposition: A | Discharge status: Home / Self Care | Payer: Medicare Other | Attending: Emergency Medicine | Admitting: Emergency Medicine

## 2016-03-06 ENCOUNTER — Emergency Department: Payer: Medicare Other

## 2016-03-06 ENCOUNTER — Encounter: Payer: Self-pay | Admitting: Emergency Medicine

## 2016-03-06 DIAGNOSIS — N179 Acute kidney failure, unspecified: Secondary | ICD-10-CM | POA: Diagnosis not present

## 2016-03-06 DIAGNOSIS — I10 Essential (primary) hypertension: Secondary | ICD-10-CM | POA: Diagnosis not present

## 2016-03-06 DIAGNOSIS — N289 Disorder of kidney and ureter, unspecified: Secondary | ICD-10-CM

## 2016-03-06 DIAGNOSIS — F329 Major depressive disorder, single episode, unspecified: Secondary | ICD-10-CM | POA: Diagnosis not present

## 2016-03-06 DIAGNOSIS — Z87891 Personal history of nicotine dependence: Secondary | ICD-10-CM | POA: Insufficient documentation

## 2016-03-06 DIAGNOSIS — R6883 Chills (without fever): Secondary | ICD-10-CM | POA: Diagnosis not present

## 2016-03-06 DIAGNOSIS — N390 Urinary tract infection, site not specified: Secondary | ICD-10-CM | POA: Diagnosis not present

## 2016-03-06 DIAGNOSIS — R0789 Other chest pain: Secondary | ICD-10-CM | POA: Diagnosis present

## 2016-03-06 LAB — CBC
HCT: 40.2 % (ref 35.0–47.0)
Hemoglobin: 13.1 g/dL (ref 12.0–16.0)
MCH: 30.6 pg (ref 26.0–34.0)
MCHC: 32.6 g/dL (ref 32.0–36.0)
MCV: 94.1 fL (ref 80.0–100.0)
PLATELETS: 149 10*3/uL — AB (ref 150–440)
RBC: 4.27 MIL/uL (ref 3.80–5.20)
RDW: 14.4 % (ref 11.5–14.5)
WBC: 15.8 10*3/uL — ABNORMAL HIGH (ref 3.6–11.0)

## 2016-03-06 LAB — BASIC METABOLIC PANEL
Anion gap: 11 (ref 5–15)
BUN: 68 mg/dL — ABNORMAL HIGH (ref 6–20)
CALCIUM: 9 mg/dL (ref 8.9–10.3)
CHLORIDE: 104 mmol/L (ref 101–111)
CO2: 22 mmol/L (ref 22–32)
CREATININE: 1.68 mg/dL — AB (ref 0.44–1.00)
GFR calc Af Amer: 31 mL/min — ABNORMAL LOW (ref >60)
GFR calc non Af Amer: 26 mL/min — ABNORMAL LOW (ref >60)
GLUCOSE: 133 mg/dL — AB (ref 65–99)
Potassium: 4.2 mmol/L (ref 3.5–5.1)
Sodium: 137 mmol/L (ref 135–145)

## 2016-03-06 LAB — URINALYSIS COMPLETE WITH MICROSCOPIC (ARMC ONLY)
Bilirubin Urine: NEGATIVE
GLUCOSE, UA: NEGATIVE mg/dL
Ketones, ur: NEGATIVE mg/dL
Nitrite: NEGATIVE
PROTEIN: 30 mg/dL — AB
SPECIFIC GRAVITY, URINE: 1.014 (ref 1.005–1.030)
pH: 5 (ref 5.0–8.0)

## 2016-03-06 LAB — TSH: TSH: 0.372 u[IU]/mL (ref 0.350–4.500)

## 2016-03-06 LAB — TROPONIN I

## 2016-03-06 MED ORDER — SODIUM CHLORIDE 0.9 % IV BOLUS (SEPSIS): 1000.0000 mL | Freq: Once | INTRAVENOUS | Status: DC

## 2016-03-06 MED ORDER — CEPHALEXIN 500 MG PO CAPS: 500.0000 mg | ORAL_CAPSULE | Freq: Four times a day (QID) | ORAL | Status: DC

## 2016-03-06 MED ORDER — ACETAMINOPHEN 500 MG PO TABS
1000.0000 mg | ORAL_TABLET | Freq: Once | ORAL | Status: AC
  Administered 2016-03-06: 1000 mg via ORAL
  Filled 2016-03-06: qty 2

## 2016-03-06 MED ORDER — CEFTRIAXONE SODIUM 1 G IJ SOLR
1.0000 g | Freq: Once | INTRAMUSCULAR | Status: AC
  Administered 2016-03-06: 1 g via INTRAVENOUS
  Filled 2016-03-06: qty 10

## 2016-03-06 MED ORDER — SODIUM CHLORIDE 0.9 % IV BOLUS (SEPSIS)
1000.0000 mL | Freq: Once | INTRAVENOUS | Status: AC
  Administered 2016-03-06: 1000 mL via INTRAVENOUS

## 2016-03-06 NOTE — Discharge Instructions (Signed)
Please drink plenty of fluid to stay well hydrated and take the entire course of antibiotics, even if you are feeling better.  Please make a follow up appointment with your regular doctor in 2 days for a recheck, and to have your kidney function rechecked.  Return to the emergency department for severe pain, inability to keep down fluids, lightheadedness or fainting, or any other symptoms concerning to you.

## 2016-03-06 NOTE — ED Provider Notes (Signed)
[facility] Emergency Department Provider Note  ____________________________________________  Time seen: Approximately 12:40 PM  I have reviewed the triage vital signs and the nursing notes.   HISTORY  Chief Complaint Chest Pain    HPI [name] is a 82 y.o. female w/ a hx of HTN, thyroid diseasepresenting w/ shaking and sob.  Pt reports that she went to church and when she sat down, she developed "shakiness" like she was cold associated with sob.  No improvement when wrapped in a blanket.  Did not feel anxious, or have any chest pain, lightheadedness, visual changes, numbness tingling or weakness. She took her daughter's Xanax and noted to feel better after that. She has a remote history of depression and is currently treated with Zoloft for that but has no history of anxiety or panic attacks in the past.  No recent illness or medication changes.  Her symptoms have completely resolved and she is feeling back to baseline at this time.   Past Medical History  Diagnosis Date  . Hypertension   . Thyroid disease   . Depression   . Chest pain   . GERD (gastroesophageal reflux disease)   . Sleep apnea     Patient Active Problem List   Diagnosis Date Noted  . Near syncope 08/20/2015  . Morbid obesity (HCC) 08/20/2015  . Pain in the chest 07/06/2015  . Awareness alteration, transient 05/07/2015  . History of stroke 05/07/2015    Past Surgical History  Procedure Laterality Date  . Total hip arthroplasty  2002     left  . Knee arthroplasty  2006    left   . Knee arthroplasty  2006    right   . Shoulder arthroscopy  2009    right  . Cataract extraction      bilateral  . Gallbladder surgery    . Appendectomy  1946    Current Outpatient Rx  Name  Route  Sig  Dispense  Refill  . allopurinol (ZYLOPRIM) 100 MG tablet      Take  Tablets daily         . calcium citrate-vitamin D (CITRACAL+D) 315-200 MG-UNIT per tablet   Oral   Take 1 tablet  by mouth daily.         . cephALEXin (KEFLEX) 500 MG capsule   Oral   Take 1 capsule (500 mg total) by mouth 4 (four) times daily.   28 capsule   0   . furosemide (LASIX) 80 MG tablet   Oral   Take 80 mg by mouth 2 (two) times daily.         Marland Kitchen levothyroxine (SYNTHROID, LEVOTHROID) 137 MCG tablet   Oral   Take 137 mcg by mouth daily before breakfast.         . LORazepam (ATIVAN) 1 MG tablet   Oral   Take 1 mg by mouth at bedtime.         Marland Kitchen losartan (COZAAR) 100 MG tablet   Oral   Take 100 mg by mouth daily.         . Misc Natural Products (GLUCOSAMINE CHONDROITIN VIT D3) CAPS   Oral   Take by mouth daily.         . Multiple Vitamins-Minerals (WOMENS ONE DAILY) TABS   Oral   Take by mouth daily.         . nitroGLYCERIN (NITROSTAT) 0.4 MG SL tablet   Sublingual   Place 0.4 mg under the tongue  every 5 (five) minutes as needed for chest pain.         Marland Kitchen. omeprazole (PRILOSEC) 40 MG capsule   Oral   Take 40 mg by mouth daily.         . sertraline (ZOLOFT) 50 MG tablet   Oral   Take 50 mg by mouth daily.           Allergies Gabapentin; Lyrica; Morphine and related; Lipitor; and Sulfa antibiotics  Family History  Problem Relation Age of Onset  . Diabetes Mother   . Stroke Mother   . Cancer Brother   . Kidney disease Sister     Social History Social History  Substance Use Topics  . Smoking status: Former Games developermoker  . Smokeless tobacco: Never Used  . Alcohol Use: No    Review of Systems Constitutional: No fever. + Shaking chills.  No lightheadedness or syncope. Eyes: No visual changes.No eye discharge for ENT: No sore throat. No congestion or rhinorrhea. Cardiovascular: Denies chest pain. Denies palpitations. Respiratory: Positive shortness of breath episode more than baseline sob.  Positive nonproductive chronic cough. Gastrointestinal: No abdominal pain.  No nausea, no vomiting.  No diarrhea.  No constipation. Genitourinary: Negative for  dysuria. Musculoskeletal: Negative for back pain. Skin: Negative for rash. Neurological: Negative for headaches. No focal numbness, tingling or weakness.  Psychiatric:No anxiety. 10-point ROS otherwise negative.  ____________________________________________   PHYSICAL EXAM:  VITAL SIGNS: ED Triage Vitals  Enc Vitals Group     BP 03/06/16 1223 104/65 mmHg     Pulse Rate 03/06/16 1223 94     Resp 03/06/16 1223 11     Temp 03/06/16 1223 100.2 F (37.9 C)     Temp Source 03/06/16 1223 Oral     SpO2 03/06/16 1223 95 %     Weight 03/06/16 1223 242 lb (109.77 kg)     Height 03/06/16 1223 5\' 4"  (1.626 m)     Head Cir --      Peak Flow --      Pain Score 03/06/16 1222 0     Pain Loc --      Pain Edu? --      Excl. in GC? --     Constitutional: Alert and oriented. Well appearing and in no acute distress. Answers questions appropriately. Eyes: Conjunctivae are normal.  EOMI. No scleral icterus. Head: Atraumatic. Nose: No congestion/rhinnorhea. Mouth/Throat: Mucous membranes are moist.  Neck: No stridor.  Supple.  No JVD. Cardiovascular: Normal rate, regular rhythm. No murmurs, rubs or gallops.  Respiratory: Normal respiratory effort.  No accessory muscle use or retractions. Lungs CTAB.  No wheezes, rales or ronchi. Gastrointestinal: Soft, nontender and nondistended.  No guarding or rebound.  No peritoneal signs. Musculoskeletal: No LE edema. No ttp in the calves or palpable cords.  Negative Homan's sign. Neurologic:  A&Ox3.  Speech is clear.  Face and smile are symmetric.  EOMI.  Moves all extremities well. Skin:  Skin is warm, dry and intact. No rash noted. Psychiatric: Mood and affect are normal. Speech and behavior are normal.  Normal judgement.  ____________________________________________   LABS (all labs ordered are listed, but only abnormal results are displayed)  Labs Reviewed  BASIC METABOLIC PANEL - Abnormal; Notable for the following:    Glucose, Bld 133 (*)     BUN 68 (*)    Creatinine, Ser 1.68 (*)    GFR calc non Af Amer 26 (*)    GFR calc Af Amer 31 (*)  All other components within normal limits  CBC - Abnormal; Notable for the following:    WBC 15.8 (*)    Platelets 149 (*)    All other components within normal limits  URINALYSIS COMPLETEWITH MICROSCOPIC ([facility] ONLY) - Abnormal; Notable for the following:    Color, Urine YELLOW (*)    APPearance CLOUDY (*)    Hgb urine dipstick 2+ (*)    Protein, ur 30 (*)    Leukocytes, UA 1+ (*)    Bacteria, UA MANY (*)    Squamous Epithelial / LPF 0-5 (*)    All other components within normal limits  CULTURE, BLOOD (ROUTINE X 2)  CULTURE, BLOOD (ROUTINE X 2)  URINE CULTURE  TROPONIN I  TSH   ____________________________________________  EKG  ED ECG REPORT I, Rockne Menghini, the attending physician, personally viewed and interpreted this ECG.   Date: 03/06/2016  EKG Time: 1224  Rate: 90  Rhythm: normal EKG, normal sinus rhythm, unchanged from previous tracings, normal sinus rhythm  Axis: leftward  Intervals:Prolonged QTC  ST&T Change: No ST elevation.  ____________________________________________  RADIOLOGY  Dg Chest 2 View  03/06/2016  CLINICAL DATA:  Pt states she was diagnosed with bronchitis a week ago- previous chest xray done. Pt states this am she started to feel light-headed, dizzy and some chest discomfort. Hx of sleep apnea, HTN. Former smoker. EXAM: CHEST  2 VIEW COMPARISON:  02/22/2016 FINDINGS: Cardiac silhouette is normal in size and configuration. The aorta is uncoiled and tortuous. No mediastinal or hilar masses or evidence of adenopathy. There are clear lungs. No pleural effusion or pneumothorax. Bony thorax is demineralized but grossly intact. IMPRESSION: No acute cardiopulmonary disease. Electronically Signed   By: Amie Portland M.D.   On: 03/06/2016 13:06    ____________________________________________   PROCEDURES  Procedure(s) performed:  None  Critical Care performed: No ____________________________________________   INITIAL IMPRESSION / ASSESSMENT AND PLAN / ED COURSE  Pertinent labs & imaging results that were available during my care of the patient were reviewed by me and considered in my medical decision making (see chart for details).  82 y.o. F w/ hx of HTN and thyroid disease presenting w/ shaking chills and sob at church today, now improved.  Pt febrile to 100.2 on arrival.  Will eval for infection including UTI, pna, bacteremia.  No murmur c/w endocarditis. No GI sx's; unlikely flu.  Will also check thyroid panel.  Plan treatment and re-evaluation.  2:08 PM  The patient is feeling much better and is asymptomatic at this time.  She has a UTI, and mild acute on chronic renal insufficiency.  I have spoken to the pt and her family, and they prefer to be discharged home with oral abx and close PMD f/u.  I will plan a dose of IV abx, a liter of fluid, prior to discharge.  Return precautions and f/u instructions were discussed.  ____________________________________________  FINAL CLINICAL IMPRESSION(S) / ED DIAGNOSES  Final diagnoses:  UTI (lower urinary tract infection)  Shaking chills  Acute renal insufficiency      NEW MEDICATIONS STARTED DURING THIS VISIT:  New Prescriptions   CEPHALEXIN (KEFLEX) 500 MG CAPSULE    Take 1 capsule (500 mg total) by mouth 4 (four) times daily.     Rockne Menghini, MD 03/06/16 1408

## 2016-03-06 NOTE — ED Notes (Signed)
[facility] complaint of feeling shaky and trouble breathing. Patient has hx/o anxiety. Also states that she had some chest pain around 0300, patient took 1 Nitro tablet and it relieved her pain. Patient currently undergoing cardiac workup by her PCP

## 2016-03-07 ENCOUNTER — Inpatient Hospital Stay (HOSPITAL_COMMUNITY)
Admission: EM | Admit: 2016-03-07 | Discharge: 2016-03-09 | DRG: 690 | Disposition: A | Discharge status: Home / Self Care | Payer: Medicare Other | Attending: Internal Medicine | Admitting: Internal Medicine

## 2016-03-07 ENCOUNTER — Encounter (HOSPITAL_COMMUNITY): Payer: Self-pay | Admitting: *Deleted

## 2016-03-07 DIAGNOSIS — G4733 Obstructive sleep apnea (adult) (pediatric): Secondary | ICD-10-CM | POA: Diagnosis present

## 2016-03-07 DIAGNOSIS — E039 Hypothyroidism, unspecified: Secondary | ICD-10-CM | POA: Diagnosis present

## 2016-03-07 DIAGNOSIS — Z823 Family history of stroke: Secondary | ICD-10-CM

## 2016-03-07 DIAGNOSIS — F418 Other specified anxiety disorders: Secondary | ICD-10-CM | POA: Diagnosis present

## 2016-03-07 DIAGNOSIS — N189 Chronic kidney disease, unspecified: Secondary | ICD-10-CM | POA: Diagnosis present

## 2016-03-07 DIAGNOSIS — I5032 Chronic diastolic (congestive) heart failure: Secondary | ICD-10-CM

## 2016-03-07 DIAGNOSIS — Z885 Allergy status to narcotic agent status: Secondary | ICD-10-CM | POA: Diagnosis not present

## 2016-03-07 DIAGNOSIS — I10 Essential (primary) hypertension: Secondary | ICD-10-CM | POA: Diagnosis not present

## 2016-03-07 DIAGNOSIS — Z96653 Presence of artificial knee joint, bilateral: Secondary | ICD-10-CM | POA: Diagnosis present

## 2016-03-07 DIAGNOSIS — Z809 Family history of malignant neoplasm, unspecified: Secondary | ICD-10-CM

## 2016-03-07 DIAGNOSIS — N179 Acute kidney failure, unspecified: Secondary | ICD-10-CM | POA: Diagnosis present

## 2016-03-07 DIAGNOSIS — Z9842 Cataract extraction status, left eye: Secondary | ICD-10-CM | POA: Diagnosis not present

## 2016-03-07 DIAGNOSIS — K219 Gastro-esophageal reflux disease without esophagitis: Secondary | ICD-10-CM | POA: Diagnosis present

## 2016-03-07 DIAGNOSIS — N393 Stress incontinence (female) (male): Secondary | ICD-10-CM | POA: Diagnosis present

## 2016-03-07 DIAGNOSIS — N390 Urinary tract infection, site not specified: Secondary | ICD-10-CM | POA: Diagnosis not present

## 2016-03-07 DIAGNOSIS — R7881 Bacteremia: Secondary | ICD-10-CM | POA: Diagnosis present

## 2016-03-07 DIAGNOSIS — Z9841 Cataract extraction status, right eye: Secondary | ICD-10-CM

## 2016-03-07 DIAGNOSIS — Z79899 Other long term (current) drug therapy: Secondary | ICD-10-CM | POA: Diagnosis not present

## 2016-03-07 DIAGNOSIS — B9620 Unspecified Escherichia coli [E. coli] as the cause of diseases classified elsewhere: Secondary | ICD-10-CM | POA: Diagnosis present

## 2016-03-07 DIAGNOSIS — Z9049 Acquired absence of other specified parts of digestive tract: Secondary | ICD-10-CM

## 2016-03-07 DIAGNOSIS — Z888 Allergy status to other drugs, medicaments and biological substances status: Secondary | ICD-10-CM | POA: Diagnosis not present

## 2016-03-07 DIAGNOSIS — Z882 Allergy status to sulfonamides status: Secondary | ICD-10-CM | POA: Diagnosis not present

## 2016-03-07 DIAGNOSIS — Z6841 Body Mass Index (BMI) 40.0 and over, adult: Secondary | ICD-10-CM | POA: Diagnosis not present

## 2016-03-07 DIAGNOSIS — Z96642 Presence of left artificial hip joint: Secondary | ICD-10-CM | POA: Diagnosis present

## 2016-03-07 DIAGNOSIS — I130 Hypertensive heart and chronic kidney disease with heart failure and stage 1 through stage 4 chronic kidney disease, or unspecified chronic kidney disease: Secondary | ICD-10-CM | POA: Diagnosis present

## 2016-03-07 DIAGNOSIS — D72829 Elevated white blood cell count, unspecified: Secondary | ICD-10-CM | POA: Diagnosis not present

## 2016-03-07 DIAGNOSIS — Z87891 Personal history of nicotine dependence: Secondary | ICD-10-CM | POA: Diagnosis not present

## 2016-03-07 DIAGNOSIS — R0789 Other chest pain: Secondary | ICD-10-CM

## 2016-03-07 LAB — CBC WITH DIFFERENTIAL/PLATELET
Basophils Absolute: 0 10*3/uL (ref 0.0–0.1)
Basophils Relative: 0 %
EOS ABS: 0.1 10*3/uL (ref 0.0–0.7)
EOS PCT: 0 %
HCT: 34 % — ABNORMAL LOW (ref 36.0–46.0)
Hemoglobin: 10.9 g/dL — ABNORMAL LOW (ref 12.0–15.0)
LYMPHS ABS: 2.5 10*3/uL (ref 0.7–4.0)
Lymphocytes Relative: 12 %
MCH: 29.9 pg (ref 26.0–34.0)
MCHC: 32.1 g/dL (ref 30.0–36.0)
MCV: 93.4 fL (ref 78.0–100.0)
MONOS PCT: 12 %
Monocytes Absolute: 2.4 10*3/uL — ABNORMAL HIGH (ref 0.1–1.0)
Neutro Abs: 15.7 10*3/uL — ABNORMAL HIGH (ref 1.7–7.7)
Neutrophils Relative %: 76 %
PLATELETS: 125 10*3/uL — AB (ref 150–400)
RBC: 3.64 MIL/uL — ABNORMAL LOW (ref 3.87–5.11)
RDW: 14.2 % (ref 11.5–15.5)
WBC: 20.7 10*3/uL — ABNORMAL HIGH (ref 4.0–10.5)

## 2016-03-07 LAB — BLOOD CULTURE ID PANEL (REFLEXED)
ACINETOBACTER BAUMANNII: NOT DETECTED
CARBAPENEM RESISTANCE: NOT DETECTED
Candida albicans: NOT DETECTED
Candida glabrata: NOT DETECTED
Candida krusei: NOT DETECTED
Candida parapsilosis: NOT DETECTED
Candida tropicalis: NOT DETECTED
ESCHERICHIA COLI: DETECTED — AB
Enterobacter cloacae complex: NOT DETECTED
Enterobacteriaceae species: DETECTED — AB
Enterococcus species: NOT DETECTED
HAEMOPHILUS INFLUENZAE: NOT DETECTED
Klebsiella oxytoca: NOT DETECTED
Klebsiella pneumoniae: NOT DETECTED
LISTERIA MONOCYTOGENES: NOT DETECTED
METHICILLIN RESISTANCE: NOT DETECTED
NEISSERIA MENINGITIDIS: NOT DETECTED
Proteus species: NOT DETECTED
Pseudomonas aeruginosa: NOT DETECTED
SERRATIA MARCESCENS: NOT DETECTED
STAPHYLOCOCCUS SPECIES: NOT DETECTED
STREPTOCOCCUS PYOGENES: NOT DETECTED
STREPTOCOCCUS SPECIES: NOT DETECTED
Staphylococcus aureus (BCID): NOT DETECTED
Streptococcus agalactiae: NOT DETECTED
Streptococcus pneumoniae: NOT DETECTED
Vancomycin resistance: NOT DETECTED

## 2016-03-07 LAB — I-STAT CHEM 8, ED
BUN: 61 mg/dL — AB (ref 6–20)
Calcium, Ion: 1.04 mmol/L — ABNORMAL LOW (ref 1.13–1.30)
Chloride: 104 mmol/L (ref 101–111)
Creatinine, Ser: 1.8 mg/dL — ABNORMAL HIGH (ref 0.44–1.00)
Glucose, Bld: 126 mg/dL — ABNORMAL HIGH (ref 65–99)
HEMATOCRIT: 36 % (ref 36.0–46.0)
Hemoglobin: 12.2 g/dL (ref 12.0–15.0)
Potassium: 4 mmol/L (ref 3.5–5.1)
SODIUM: 140 mmol/L (ref 135–145)
TCO2: 24 mmol/L (ref 0–100)

## 2016-03-07 LAB — COMPREHENSIVE METABOLIC PANEL
ALK PHOS: 91 U/L (ref 38–126)
ALT: 26 U/L (ref 14–54)
ANION GAP: 13 (ref 5–15)
AST: 27 U/L (ref 15–41)
Albumin: 2.8 g/dL — ABNORMAL LOW (ref 3.5–5.0)
BUN: 66 mg/dL — ABNORMAL HIGH (ref 6–20)
CALCIUM: 8.4 mg/dL — AB (ref 8.9–10.3)
CHLORIDE: 106 mmol/L (ref 101–111)
CO2: 22 mmol/L (ref 22–32)
Creatinine, Ser: 1.85 mg/dL — ABNORMAL HIGH (ref 0.44–1.00)
GFR calc non Af Amer: 24 mL/min — ABNORMAL LOW (ref >60)
GFR, EST AFRICAN AMERICAN: 27 mL/min — AB (ref >60)
Glucose, Bld: 132 mg/dL — ABNORMAL HIGH (ref 65–99)
Potassium: 4.1 mmol/L (ref 3.5–5.1)
SODIUM: 141 mmol/L (ref 135–145)
Total Bilirubin: 0.4 mg/dL (ref 0.3–1.2)
Total Protein: 5.6 g/dL — ABNORMAL LOW (ref 6.5–8.1)

## 2016-03-07 LAB — URINE MICROSCOPIC-ADD ON

## 2016-03-07 LAB — CREATININE, SERUM
Creatinine, Ser: 1.88 mg/dL — ABNORMAL HIGH (ref 0.44–1.00)
GFR calc non Af Amer: 23 mL/min — ABNORMAL LOW (ref >60)
GFR, EST AFRICAN AMERICAN: 27 mL/min — AB (ref >60)

## 2016-03-07 LAB — GLUCOSE, CAPILLARY
GLUCOSE-CAPILLARY: 140 mg/dL — AB (ref 65–99)
Glucose-Capillary: 133 mg/dL — ABNORMAL HIGH (ref 65–99)

## 2016-03-07 LAB — CBC
HCT: 35.2 % — ABNORMAL LOW (ref 36.0–46.0)
Hemoglobin: 11 g/dL — ABNORMAL LOW (ref 12.0–15.0)
MCH: 29.4 pg (ref 26.0–34.0)
MCHC: 31.3 g/dL (ref 30.0–36.0)
MCV: 94.1 fL (ref 78.0–100.0)
Platelets: 113 10*3/uL — ABNORMAL LOW (ref 150–400)
RBC: 3.74 MIL/uL — ABNORMAL LOW (ref 3.87–5.11)
RDW: 14.3 % (ref 11.5–15.5)
WBC: 17.1 10*3/uL — AB (ref 4.0–10.5)

## 2016-03-07 LAB — URINALYSIS, ROUTINE W REFLEX MICROSCOPIC
Bilirubin Urine: NEGATIVE
Glucose, UA: NEGATIVE mg/dL
KETONES UR: NEGATIVE mg/dL
NITRITE: NEGATIVE
PH: 5.5 (ref 5.0–8.0)
PROTEIN: 100 mg/dL — AB
Specific Gravity, Urine: 1.015 (ref 1.005–1.030)

## 2016-03-07 LAB — I-STAT CG4 LACTIC ACID, ED: LACTIC ACID, VENOUS: 1.37 mmol/L (ref 0.5–2.0)

## 2016-03-07 MED ORDER — LORAZEPAM 1 MG PO TABS
1.0000 mg | ORAL_TABLET | Freq: Every day | ORAL | Status: DC
  Administered 2016-03-07 – 2016-03-08 (×2): 1 mg via ORAL
  Filled 2016-03-07 (×2): qty 1

## 2016-03-07 MED ORDER — FUROSEMIDE 80 MG PO TABS
80.0000 mg | ORAL_TABLET | Freq: Two times a day (BID) | ORAL | Status: DC
  Administered 2016-03-07 – 2016-03-09 (×4): 80 mg via ORAL
  Filled 2016-03-07 (×4): qty 1

## 2016-03-07 MED ORDER — ACETAMINOPHEN 650 MG RE SUPP: 650.0000 mg | Freq: Four times a day (QID) | RECTAL | Status: DC | PRN

## 2016-03-07 MED ORDER — ALLOPURINOL 100 MG PO TABS
100.0000 mg | ORAL_TABLET | Freq: Every day | ORAL | Status: DC
  Administered 2016-03-07 – 2016-03-09 (×3): 100 mg via ORAL
  Filled 2016-03-07 (×3): qty 1

## 2016-03-07 MED ORDER — SODIUM CHLORIDE 0.9 % IV BOLUS (SEPSIS)
1000.0000 mL | Freq: Once | INTRAVENOUS | Status: AC
  Administered 2016-03-07: 1000 mL via INTRAVENOUS

## 2016-03-07 MED ORDER — SODIUM CHLORIDE 0.9 % IV SOLN
INTRAVENOUS | Status: DC
  Administered 2016-03-07: 10:00:00 via INTRAVENOUS

## 2016-03-07 MED ORDER — SODIUM CHLORIDE 0.9 % IV SOLN
INTRAVENOUS | Status: DC
  Administered 2016-03-07: 07:00:00 via INTRAVENOUS

## 2016-03-07 MED ORDER — HEPARIN SODIUM (PORCINE) 5000 UNIT/ML IJ SOLN
5000.0000 [IU] | Freq: Three times a day (TID) | INTRAMUSCULAR | Status: DC
  Administered 2016-03-07 – 2016-03-09 (×7): 5000 [IU] via SUBCUTANEOUS
  Filled 2016-03-07 (×6): qty 1

## 2016-03-07 MED ORDER — SERTRALINE HCL 50 MG PO TABS
50.0000 mg | ORAL_TABLET | Freq: Every day | ORAL | Status: DC
  Administered 2016-03-07 – 2016-03-09 (×3): 50 mg via ORAL
  Filled 2016-03-07 (×3): qty 1

## 2016-03-07 MED ORDER — ONDANSETRON HCL 4 MG/2ML IJ SOLN: 4.0000 mg | Freq: Four times a day (QID) | INTRAMUSCULAR | Status: DC | PRN

## 2016-03-07 MED ORDER — LEVOTHYROXINE SODIUM 25 MCG PO TABS
137.0000 ug | ORAL_TABLET | Freq: Every day | ORAL | Status: DC
  Administered 2016-03-08 – 2016-03-09 (×2): 137 ug via ORAL
  Filled 2016-03-07 (×2): qty 1

## 2016-03-07 MED ORDER — DEXTROSE 5 % IV SOLN
2.0000 g | Freq: Every day | INTRAVENOUS | Status: DC
  Administered 2016-03-07 – 2016-03-09 (×3): 2 g via INTRAVENOUS
  Filled 2016-03-07 (×3): qty 2

## 2016-03-07 MED ORDER — ONDANSETRON HCL 4 MG PO TABS: 4.0000 mg | ORAL_TABLET | Freq: Four times a day (QID) | ORAL | Status: DC | PRN

## 2016-03-07 MED ORDER — PANTOPRAZOLE SODIUM 40 MG PO TBEC
40.0000 mg | DELAYED_RELEASE_TABLET | Freq: Every day | ORAL | Status: DC
  Administered 2016-03-07 – 2016-03-09 (×3): 40 mg via ORAL
  Filled 2016-03-07 (×3): qty 1

## 2016-03-07 MED ORDER — ACETAMINOPHEN 325 MG PO TABS
650.0000 mg | ORAL_TABLET | Freq: Four times a day (QID) | ORAL | Status: DC | PRN
  Administered 2016-03-07 – 2016-03-08 (×2): 650 mg via ORAL
  Filled 2016-03-07 (×2): qty 2

## 2016-03-07 MED ORDER — LOSARTAN POTASSIUM 50 MG PO TABS
100.0000 mg | ORAL_TABLET | Freq: Every day | ORAL | Status: DC
  Administered 2016-03-08 – 2016-03-09 (×2): 100 mg via ORAL
  Filled 2016-03-07 (×2): qty 2

## 2016-03-07 NOTE — ED Notes (Signed)
Thew pt was seen at [facility] ed earlier today treatedd for a uti and blood culturfes were drawn.  The family  Was calledd and was told the pt had  Pos blood cultures to bring to the ed

## 2016-03-07 NOTE — Progress Notes (Signed)
Pt is on NIV at this time tolerating it well.  

## 2016-03-07 NOTE — ED Provider Notes (Signed)
TIME SEEN: 5:50 AM  CHIEF COMPLAINT: Bacteremia  HPI: Pt is a 82 y.o. female with history of hypertension, hypothyroidism who presents emergency department with concerns for positive blood cultures. Was seen in [facility] on 03/06/16 for fever. Has had a chronic cough which is unchanged. Chest x-ray at that time was negative. Was found to have a urinary tract infection. Urine culture was pending. Discharged on Keflex. Blood cultures pending. Offered admission at that time patient declined. Reports she is still having fevers at home. No pain anywhere. No headache, neck pain or neck stiffness. No chest pain or shortness of breath. No abdominal pain. No fevers or diarrhea. No rash. No dysuria or hematuria. Blood cultures were positive for Escherichia coli and Enterobacter. She was instructed to come back to the closest emergency department. PCP is Dr. Kirby FunkJohn Griffin with [facility].  ROS: See HPI Constitutional:  fever  Eyes: no drainage  ENT: no runny nose   Cardiovascular:  no chest pain  Resp: no SOB  GI: no vomiting GU: no dysuria Integumentary: no rash  Allergy: no hives  Musculoskeletal: no leg swelling  Neurological: no slurred speech ROS otherwise negative  PAST MEDICAL HISTORY/PAST SURGICAL HISTORY:  Past Medical History  Diagnosis Date  . Hypertension   . Thyroid disease   . Depression   . Chest pain   . GERD (gastroesophageal reflux disease)   . Sleep apnea     MEDICATIONS:  Prior to Admission medications   Medication Sig Start Date End Date Taking? Authorizing Provider  allopurinol (ZYLOPRIM) 100 MG tablet Take  Tablets daily    Historical Provider, MD  calcium citrate-vitamin D (CITRACAL+D) 315-200 MG-UNIT per tablet Take 1 tablet by mouth daily.    Historical Provider, MD  cephALEXin (KEFLEX) 500 MG capsule Take 1 capsule (500 mg total) by mouth 4 (four) times daily. 03/06/16 03/16/16  Anne-Caroline Sharma CovertNorman, MD  furosemide  (LASIX) 80 MG tablet Take 80 mg by mouth 2 (two) times daily.    Historical Provider, MD  levothyroxine (SYNTHROID, LEVOTHROID) 137 MCG tablet Take 137 mcg by mouth daily before breakfast.    Historical Provider, MD  LORazepam (ATIVAN) 1 MG tablet Take 1 mg by mouth at bedtime.    Historical Provider, MD  losartan (COZAAR) 100 MG tablet Take 100 mg by mouth daily.    Historical Provider, MD  Misc Natural Products (GLUCOSAMINE CHONDROITIN VIT D3) CAPS Take by mouth daily.    Historical Provider, MD  Multiple Vitamins-Minerals (WOMENS ONE DAILY) TABS Take by mouth daily.    Historical Provider, MD  nitroGLYCERIN (NITROSTAT) 0.4 MG SL tablet Place 0.4 mg under the tongue every 5 (five) minutes as needed for chest pain.    Historical Provider, MD  omeprazole (PRILOSEC) 40 MG capsule Take 40 mg by mouth daily.    Historical Provider, MD  sertraline (ZOLOFT) 50 MG tablet Take 50 mg by mouth daily.    Historical Provider, MD    ALLERGIES:  Allergies  Allergen Reactions  . Gabapentin Other (See Comments)    Alters mood  . Lyrica [Pregabalin] Other (See Comments)    Lethargic, mood changes  . Morphine And Related Hives and Itching  . Lipitor [Atorvastatin] Itching and Rash  . Sulfa Antibiotics Rash    SOCIAL HISTORY:  Social History  Substance Use Topics  . Smoking status: Former Games developermoker  . Smokeless tobacco: Never Used  . Alcohol Use: No    FAMILY HISTORY: Family History  Problem  Relation Age of Onset  . Diabetes Mother   . Stroke Mother   . Cancer Brother   . Kidney disease Sister     EXAM: BP 100/55 mmHg  Pulse 65  Temp(Src) 97.4 F (36.3 C)  Resp 18  Ht  (1.626 m)  Wt 252 lb (114.306 kg)  BMI 43.23 kg/m2  SpO2 97% CONSTITUTIONAL: Alert and oriented and responds appropriately to questions. Well-appearing; well-nourished, Elderly, in no distress, nontoxic, afebrile HEAD: Normocephalic EYES: Conjunctivae clear, PERRL ENT: normal nose; no rhinorrhea; moist mucous  membranes NECK: Supple, no meningismus, no LAD  CARD: RRR; S1 and S2 appreciated; no murmurs, no clicks, no rubs, no gallops RESP: Normal chest excursion without splinting or tachypnea; breath sounds clear and equal bilaterally; no wheezes, no rhonchi, no rales, no hypoxia or respiratory distress, speaking full sentences ABD/GI: Normal bowel sounds; non-distended; soft, non-tender, no rebound, no guarding, no peritoneal signs BACK:  The back appears normal and is non-tender to palpation, there is no CVA tenderness EXT: Normal ROM in all joints; non-tender to palpation; no edema; normal capillary refill; no cyanosis, no calf tenderness or swelling    SKIN: Normal color for age and race; warm; no rash NEURO: Moves all extremities equally, sensation to light touch intact diffusely, cranial nerves II through XII intact PSYCH: The patient's mood and manner are appropriate. Grooming and personal hygiene are appropriate.  MEDICAL DECISION MAKING: Patient here with positive blood cultures. She is well-appearing. Has had one documented slightly low blood pressure but this is improved upon recheck. We'll give IV hydration. We'll give cefepime. Will repeat labs, blood cultures, urine, urine culture. I do not feel she needs a repeat chest x-ray at this time. Patient will need admission.  ED PROGRESS: Patient's labs show leukocytosis of 20.7 with left shift. She does have acute on chronic renal insufficiency. We'll continue to hydrate. Lactate normal at 1.37.   8:00 AM  Discussed patient's case with hospitalist, Dr. Konrad Dolores.  Recommend admission to medical, inpatient bed.  I will place holding orders per their request. Patient and family (if present) updated with plan. Care transferred to hospitalist service.   I reviewed all nursing notes, vitals, pertinent old records, EKGs, labs, imaging (as available).  CRITICAL CARE Performed by: Raelyn Number   Total critical care time: 35 minutes  Critical  care time was exclusive of separately billable procedures and treating other patients.  Critical care was necessary to treat or prevent imminent or life-threatening deterioration.  Critical care was time spent personally by me on the following activities: development of treatment plan with patient and/or surrogate as well as nursing, discussions with consultants, evaluation of patient's response to treatment, examination of patient, obtaining history from patient or surrogate, ordering and performing treatments and interventions, ordering and review of laboratory studies, ordering and review of radiographic studies, pulse oximetry and re-evaluation of patient's condition.      Layla Maw Ward, DO 03/07/16 [phone]

## 2016-03-07 NOTE — Progress Notes (Signed)
Pharmacy Antibiotic Note  [name] is a 82 y.o. female admitted on 03/07/2016 with Ecoli and Enterobacteriaceae bacteremia - seen on rapid culture identification from [facility] 4/9 ~1332.  Pharmacy has been consulted for Cefepime dosing.  Rocephin 1gm was given at [facility] 4/9 ~1600.  Plan: Cefepime 2gm IV q24h Will f/u micro data, renal function, and pt's clinical condition  Height: 5\' 4"  (162.6 cm) Weight: 252 lb (114.306 kg) IBW/kg (Calculated) : 54.7  Temp (24hrs), Avg:98.8 F (37.1 C), Min:97.4 F (36.3 C), Max:100.2 F (37.9 C)   Recent Labs Lab 03/06/16 1228  WBC 15.8*  CREATININE 1.68*    Estimated Creatinine Clearance: 29.8 mL/min (by C-G formula based on Cr of 1.68).    Allergies  Allergen Reactions  . Gabapentin Other (See Comments)    Alters mood  . Lyrica [Pregabalin] Other (See Comments)    Lethargic, mood changes  . Morphine And Related Hives and Itching  . Lipitor [Atorvastatin] Itching and Rash  . Sulfa Antibiotics Rash    Antimicrobials this admission: 4/9 Rocephin x 1 4/10 Cefepime >>   Microbiology results: 4/9 ([place]) BCx x2: Ecoli and Enterobacteriaceae 4/9 ([place]) UCx:   4/10 UCx: 4/10 BCx x 2:  Thank you for allowing pharmacy to be a part of this patient's care.  Christoper Fabianaron Carlina Derks, PharmD, BCPS Clinical pharmacist, pager [phone] 03/07/2016 6:16 AM

## 2016-03-07 NOTE — Progress Notes (Signed)
New Admission Note: transfer from ED  Arrival Method: stretcher Mental Orientation: a/o x4 Telemetry: n/a Assessment: Completed Skin: clean dry intact IV: RFA NS 100 Pain: none Tubes: none Safety Measures: Safety Fall Prevention Plan has been given, discussed and signed Admission: Completed Unit Orientation: Patient has been orientated to the room, unit and staff.  Family:  Present upon arrival  Orders have been reviewed and implemented. Will continue to monitor the patient. Call light has been placed within reach and bed alarm has been activated.   Janeann ForehandLuke Myrle Dues BSN, RN

## 2016-03-07 NOTE — ED Notes (Signed)
Admitting at bedside 

## 2016-03-07 NOTE — H&P (Signed)
[facility] History and Physical  JANILAH HOJNACKI Castaneda:[id number] DOB: 03-14-1928 DOA: 03/07/2016  Referring physician: Dr. Elesa Massed - [facility] PCP: Lillia Mountain, MD   Chief Complaint: Bacteremia  HPI: [name] is a 82 y.o. female  Patient presenting to [facility] emergency room after being called by hospital staff at [facility] for positive blood cultures. Patient was seen at [facility] on 03/06/2016 and was diagnosed with a UTI. At that time blood cultures were drawn which came back positive today showing Escherichia coli and Enterobacter. During time [place] [facility] patient was treated with IV ceftriaxone and sent home with Keflex. Patient reports compliance with Keflex. At time of presentation [place] [facility] patient was offered admission but she declined.  Patient's symptoms started Sunday while attending church services. She states that she began to feel weak and shaky all over. Shaking persisted and involved the entire body which prompted her family and other church members to take patient to the local fire department. EMS was called and patient was taken out as regional. Patient continues to feel intermittently shaky and progressively more weak and fatigued ever since being seen at [facility]. Patient denies any chest pain outside of her baseline episodic chest pain episodes which is been worked up significantly. Patient denies any shortness breath, cough, nausea, vomiting, diarrhea, LOC, dizziness, headache, neck stiffness, lower extremity swelling above baseline, dysuria, frequency, back pain/flank pain.   Review of Systems:  Constitutional:  No weight loss, night sweats HEENT:  No headaches, Difficulty swallowing,Tooth/dental problems,Sore throat, Cardio-vascular:  No Orthopnea, PND, swelling in lower extremities, anasarca, dizziness, palpitations  GI:  No heartburn, indigestion, abdominal pain, nausea, vomiting, diarrhea, change in bowel habits,  loss of appetite  Resp:   No shortness of breath with exertion or at rest. No excess mucus, no productive cough, No non-productive cough, No coughing up of blood.No change in color of mucus.No wheezing.No chest wall deformity  Skin:  no rash or lesions.  GU:  no dysuria, change in color of urine, no urgency or frequency. No flank pain.  Musculoskeletal:   No joint pain or swelling. No decreased range of motion. No back pain.  Psych:  No change in mood or affect. No depression or anxiety. No memory loss.  Neuro:  No change in sensation, unilateral strength, or cognitive abilities  All other systems were reviewed and are negative.  Past Medical History  Diagnosis Date  . Hypertension   . Thyroid disease   . Depression   . Chest pain   . GERD (gastroesophageal reflux disease)   . Sleep apnea    Past Surgical History  Procedure Laterality Date  . Total hip arthroplasty  2002     left  . Knee arthroplasty  2006    left   . Knee arthroplasty  2006    right   . Shoulder arthroscopy  2009    right  . Cataract extraction      bilateral  . Gallbladder surgery    . Appendectomy  1946   Social History:  reports that she has quit smoking. She has never used smokeless tobacco. She reports that she does not drink alcohol or use illicit drugs.  Allergies  Allergen Reactions  . Gabapentin Other (See Comments)    Alters mood  . Lyrica [Pregabalin] Other (See Comments)    Lethargic, mood changes  . Morphine And Related Hives and Itching  . Lipitor [Atorvastatin] Itching and Rash  . Sulfa Antibiotics Rash    Family  History  Problem Relation Age of Onset  . Diabetes Mother   . Stroke Mother   . Cancer Brother   . Kidney disease Sister      Prior to Admission medications   Medication Sig Start Date End Date Taking? Authorizing Provider  allopurinol (ZYLOPRIM) 100 MG tablet Take 100 mg by mouth daily.    Yes Historical Provider, MD  calcium citrate-vitamin D (CITRACAL+D)  315-200 MG-UNIT per tablet Take 1 tablet by mouth daily.   Yes Historical Provider, MD  cephALEXin (KEFLEX) 500 MG capsule Take 1 capsule (500 mg total) by mouth 4 (four) times daily. 03/06/16 03/16/16 Yes Anne-Caroline Sharma Covert, MD  furosemide (LASIX) 80 MG tablet Take 80 mg by mouth 2 (two) times daily.   Yes Historical Provider, MD  levothyroxine (SYNTHROID, LEVOTHROID) 137 MCG tablet Take 137 mcg by mouth daily before breakfast.   Yes Historical Provider, MD  LORazepam (ATIVAN) 1 MG tablet Take 1 mg by mouth at bedtime.   Yes Historical Provider, MD  losartan (COZAAR) 100 MG tablet Take 100 mg by mouth daily.   Yes Historical Provider, MD  Misc Natural Products (GLUCOSAMINE CHONDROITIN VIT D3) CAPS Take 1 capsule by mouth daily.    Yes Historical Provider, MD  Multiple Vitamin (MULTIVITAMIN WITH MINERALS) TABS tablet Take 1 tablet by mouth daily.   Yes Historical Provider, MD  nitroGLYCERIN (NITROSTAT) 0.4 MG SL tablet Place 0.4 mg under the tongue every 5 (five) minutes as needed for chest pain.   Yes Historical Provider, MD  omeprazole (PRILOSEC) 40 MG capsule Take 40 mg by mouth daily.   Yes Historical Provider, MD  sertraline (ZOLOFT) 50 MG tablet Take 50 mg by mouth daily.   Yes Historical Provider, MD   Physical Exam: Filed Vitals:   03/07/16 0630 03/07/16 0700 03/07/16 0845 03/07/16 0933  BP: 101/54 110/53 118/66 125/47  Pulse: 58 57 58 63  Temp:    98.4 F (36.9 C)  TempSrc:    Oral  Resp: Height:      Weight:      SpO2: 91% 95% 96% 96%    Wt Readings from Last 3 Encounters:  03/07/16 114.306 kg (252 lb)  03/06/16 109.77 kg (242 lb)  10/07/15 116.03 kg (255 lb 12.8 oz)    General:  Appears calm and comfortable Eyes:  PERRL, EOMI, normal lids, iris ENT:  grossly normal hearing, lips & tongue Neck:  no LAD, masses or thyromegaly Cardiovascular:  RRR, no m/r/g. No LE edema.  Respiratory:  Few ronchi in bases, no w/r/r. Normal respiratory effort. Abdomen:  soft,  ntnd Skin:  no rash or induration seen on limited exam Musculoskeletal:  grossly normal tone BUE/BLE, no CVA tenderness Psychiatric:  grossly normal mood and affect, speech fluent and appropriate Neurologic:  CN 2-12 grossly intact, moves all extremities in coordinated fashion.          Labs on Admission:  Basic Metabolic Panel:  Recent Labs Lab 03/06/16 1228 03/07/16 0620 03/07/16 0702  NA 137 141 140  K 4.2 4.1 4.0  CL 104 106 104  CO2 22 22  --   GLUCOSE 133* 132* 126*  BUN 68* 66* 61*  CREATININE 1.68* 1.85* 1.80*  CALCIUM 9.0 8.4*  --    Liver Function Tests:  Recent Labs Lab 03/07/16 0620  AST 27  ALT 26  ALKPHOS 91  BILITOT 0.4  PROT 5.6*  ALBUMIN 2.8*   No results for input(s): LIPASE, AMYLASE in the  last 168 hours. No results for input(s): AMMONIA in the last 168 hours. CBC:  Recent Labs Lab 03/06/16 1228 03/07/16 0620 03/07/16 0702  WBC 15.8* 20.7*  --   NEUTROABS  --  15.7*  --   HGB 13.1 10.9* 12.2  HCT 40.2 34.0* 36.0  MCV 94.1 93.4  --   PLT 149* 125*  --    Cardiac Enzymes:  Recent Labs Lab 03/06/16 1228  TROPONINI <0.03    BNP (last 3 results) No results for input(s): BNP in the last 8760 hours.  ProBNP (last 3 results) No results for input(s): PROBNP in the last 8760 hours.   CREATININE: 1.8 mg/dL ABNORMAL (16/08/9603/10/17 [id number]) Estimated creatinine clearance - 27.8 mL/min  CBG: No results for input(s): GLUCAP in the last 168 hours.  Radiological Exams on Admission: Dg Chest 2 View  03/06/2016  CLINICAL DATA:  Pt states she was diagnosed with bronchitis a week ago- previous chest xray done. Pt states this am she started to feel light-headed, dizzy and some chest discomfort. Hx of sleep apnea, HTN. Former smoker. EXAM: CHEST  2 VIEW COMPARISON:  02/22/2016 FINDINGS: Cardiac silhouette is normal in size and configuration. The aorta is uncoiled and tortuous. No mediastinal or hilar masses or evidence of adenopathy. There are clear  lungs. No pleural effusion or pneumothorax. Bony thorax is demineralized but grossly intact. IMPRESSION: No acute cardiopulmonary disease. Electronically Signed   By: Amie Portlandavid  Ormond M.D.   On: 03/06/2016 13:06    Assessment/Plan Principal Problem:   Bacteremia Active Problems:   Atypical chest pain   Acute-on-chronic kidney injury [facility](HCC)   Essential hypertension   Chronic diastolic congestive heart failure (HCC)   Hypothyroidism   Depression with anxiety  Bacteremia: Ecoli/Enterobacter based [facility]. Likely urologic source given chronic urinary stress incontinence. On Cefepime by EDP. WBC 20.7, lactic acid 1.37, UA grossly abnormal. - Med surge - f/u BCX - f/u Sensitivities and narrow as able - UCX  AoCKD: Cr 1.8. Baseline 1.2. suspect from dehydration and intrarenal infection - ABX as above - IVF - BMET in am  Hypertension: Normotensive without incidence evidence of hypotensive shock. - Resume Losartan on 03/08/2016  CP: Currently asymptomatic. Last episode 1 day ago. episodic and at baseline. Nuclear stress in 2016 nml. Per cards last note this could be MSK or GI - continue nitro prn - GI cocktail  Diastolic CHF: Last Echo showing EF 55% grade 1 diastolic dysfunction. Lower extremity edema at baseline. - Continue Lasix - Strict I/O and dly wts  Hypothyroid: - continue synthroid  Depression/Anxiety: - Continue Ativan and zoloft  Code Status: FULL  DVT Prophylaxis:  HEP Family Communication: daughtersx2 Disposition Plan: Pending Improvement    MERRELL, DAVID Shela CommonsJ, MD Family Medicine [facility] [email or web address] Password [email or web address]

## 2016-03-07 NOTE — Progress Notes (Signed)
Bio[employer] result  Patient is discharged from ED. Lab called with [employer] result GNR x1 anaerobic, E. coli KPC negative. Charge nurse in ED notified and will advise MD.

## 2016-03-07 NOTE — ED Notes (Signed)
Spoke with patient's daughter, De NurseCindy Barnes.  Instructed her that patient would need to be admitted based on lab results that we had just received.  Ms. Zachery DauerBarnes verbalized understanding.

## 2016-03-07 NOTE — ED Notes (Signed)
[facility] with positive blood culture result that had been reported to him by lab - gram negative rods in anerobic bottle.  Spoke with Dr. Dolores FrameSung who instructed this RN to contact the patient and instruct her to go to nearest facility for admission.

## 2016-03-07 NOTE — ED Provider Notes (Signed)
-----------------------------------------   4:16 AM on 03/07/2016 -----------------------------------------  ED charge nurse was called by pharmacy with blood culture positive for Enterobacter and Escherichia coli. Charge nurse called the patient with no answer. Charge nurse did speak with patient's daughter via telephone and instructed her, per my recommendation, to return to our ED or nearest facility for admission for bacteremia. Daughter verbalized understanding of need for patient to proceed now to her nearest facility for admission.  Irean HongJade J Chelle Cayton, MD 03/07/16 [phone]

## 2016-03-08 DIAGNOSIS — F418 Other specified anxiety disorders: Secondary | ICD-10-CM

## 2016-03-08 DIAGNOSIS — E038 Other specified hypothyroidism: Secondary | ICD-10-CM

## 2016-03-08 LAB — URINE CULTURE: Culture: 1000 — AB

## 2016-03-08 LAB — COMPREHENSIVE METABOLIC PANEL
ALT: 20 U/L (ref 14–54)
AST: 17 U/L (ref 15–41)
Albumin: 2.3 g/dL — ABNORMAL LOW (ref 3.5–5.0)
Alkaline Phosphatase: 78 U/L (ref 38–126)
Anion gap: 11 (ref 5–15)
BUN: 49 mg/dL — ABNORMAL HIGH (ref 6–20)
CHLORIDE: 115 mmol/L — AB (ref 101–111)
CO2: 21 mmol/L — ABNORMAL LOW (ref 22–32)
CREATININE: 1.54 mg/dL — AB (ref 0.44–1.00)
Calcium: 8.1 mg/dL — ABNORMAL LOW (ref 8.9–10.3)
GFR, EST AFRICAN AMERICAN: 34 mL/min — AB (ref >60)
GFR, EST NON AFRICAN AMERICAN: 29 mL/min — AB (ref >60)
Glucose, Bld: 125 mg/dL — ABNORMAL HIGH (ref 65–99)
POTASSIUM: 3.9 mmol/L (ref 3.5–5.1)
Sodium: 147 mmol/L — ABNORMAL HIGH (ref 135–145)
Total Bilirubin: 0.4 mg/dL (ref 0.3–1.2)
Total Protein: 5.4 g/dL — ABNORMAL LOW (ref 6.5–8.1)

## 2016-03-08 LAB — CBC
HCT: 32.4 % — ABNORMAL LOW (ref 36.0–46.0)
Hemoglobin: 10.6 g/dL — ABNORMAL LOW (ref 12.0–15.0)
MCH: 30.8 pg (ref 26.0–34.0)
MCHC: 32.7 g/dL (ref 30.0–36.0)
MCV: 94.2 fL (ref 78.0–100.0)
PLATELETS: 114 10*3/uL — AB (ref 150–400)
RBC: 3.44 MIL/uL — ABNORMAL LOW (ref 3.87–5.11)
RDW: 14.3 % (ref 11.5–15.5)
WBC: 12.3 10*3/uL — ABNORMAL HIGH (ref 4.0–10.5)

## 2016-03-08 MED ORDER — GUAIFENESIN ER 600 MG PO TB12
600.0000 mg | ORAL_TABLET | Freq: Two times a day (BID) | ORAL | Status: DC
  Administered 2016-03-08 – 2016-03-09 (×3): 600 mg via ORAL
  Filled 2016-03-08 (×3): qty 1

## 2016-03-08 NOTE — Progress Notes (Addendum)
PROGRESS NOTE  [name] ZOX:[id number] DOB: 10/16/1928 DOA: 03/07/2016 PCP: Lillia MountainGRIFFIN,JOHN JOSEPH, MD  HPI/Recap of past 24 hours:  Feeling better, no fever, denies pain, some dry cough and ankle edema, two daughters in room  Assessment/Plan: Principal Problem:   Bacteremia Active Problems:   Atypical chest pain   Acute-on-chronic kidney injury (HCC)   Essential hypertension   Chronic diastolic congestive heart failure (HCC)   Hypothyroidism   Depression with anxiety   UTI (lower urinary tract infection)  Uti/bacteremia: continue cefepime, culture pending, patient is stable.  Diastolic chf, some ankle edema, dry cough, cxr unremarkable, d/c ivf, continue home lasix.  Htn: stable on home meds losartan and lasix  Hypothyroidism: on synthroid  Obesity/ OSA: cpap   Code Status: full  Family Communication: patient and two duaghters  Disposition Plan: home in 1-2 days , pending culture result   Consultants:  none  Procedures:  none  Antibiotics:  Cefepime from admission   Objective: BP 129/63 mmHg  Pulse 67  Temp(Src) 98.2 F (36.8 C) (Oral)  Resp 19  Ht 5\' 4"  (1.626 m)  Wt 114.2 kg (251 lb 12.3 oz)  BMI 43.19 kg/m2  SpO2 94%  Intake/Output Summary (Last 24 hours) at 03/08/16 1622 Last data filed at 03/08/16 1537  Gross per 24 hour  Intake    600 ml  Output   1200 ml  Net   -600 ml   Filed Weights   03/07/16 0525 03/07/16 2058  Weight: 114.306 kg (252 lb) 114.2 kg (251 lb 12.3 oz)    Exam:   General:  NAD  Cardiovascular: RRR  Respiratory: CTABL  Abdomen: Soft/ND/NT, positive BS  Musculoskeletal: pitting bilateral ankle Edema  Neuro: aaox3  Data Reviewed: Basic Metabolic Panel:  Recent Labs Lab 03/06/16 1228 03/07/16 0620 03/07/16 0702 03/07/16 1133 03/08/16 0544  NA 137 141 140  --  147*  K 4.2 4.1 4.0  --  3.9  CL 104 106 104  --  115*  CO2 22 22  --   --  21*  GLUCOSE 133* 132* 126*  --  125*  BUN 68* 66* 61*   --  49*  CREATININE 1.68* 1.85* 1.80* 1.88* 1.54*  CALCIUM 9.0 8.4*  --   --  8.1*   Liver Function Tests:  Recent Labs Lab 03/07/16 0620 03/08/16 0544  AST 27 17  ALT 26 20  ALKPHOS 91 78  BILITOT 0.4 0.4  PROT 5.6* 5.4*  ALBUMIN 2.8* 2.3*   No results for input(s): LIPASE, AMYLASE in the last 168 hours. No results for input(s): AMMONIA in the last 168 hours. CBC:  Recent Labs Lab 03/06/16 1228 03/07/16 0620 03/07/16 0702 03/07/16 1133 03/08/16 0544  WBC 15.8* 20.7*  --  17.1* 12.3*  NEUTROABS  --  15.7*  --   --   --   HGB 13.1 10.9* 12.2 11.0* 10.6*  HCT 40.2 34.0* 36.0 35.2* 32.4*  MCV 94.1 93.4  --  94.1 94.2  PLT 149* 125*  --  113* 114*   Cardiac Enzymes:    Recent Labs Lab 03/06/16 1228  TROPONINI <0.03   BNP (last 3 results) No results for input(s): BNP in the last 8760 hours.  ProBNP (last 3 results) No results for input(s): PROBNP in the last 8760 hours.  CBG:  Recent Labs Lab 03/07/16 1532 03/07/16 2054  GLUCAP 140* 133*    Recent Results (from the past 240 hour(s))  Blood culture (routine x 2)  Status: Abnormal (Preliminary result)   Collection Time: 03/06/16  1:13 PM  Result Value Ref Range Status   Specimen Description BLOOD LEFT ANTECUBITAL  Final   Special Requests BOTTLES DRAWN AEROBIC AND ANAEROBIC  2CC  Final   Culture  Setup Time   Final    GRAM NEGATIVE RODS ANAEROBIC BOTTLE ONLY CRITICAL RESULT CALLED TO, READ BACK BY AND VERIFIED WITH: MATT MCBANE AT 0344 03/07/16.PMH CONFIRMED BY [facility] Organism ID to follow    Culture (A)  Final    ESCHERICHIA COLI ANAEROBIC BOTTLE ONLY SUSCEPTIBILITIES TO FOLLOW    Report Status PENDING  Incomplete  Urine culture     Status: Abnormal   Collection Time: 03/06/16  1:13 PM  Result Value Ref Range Status   Specimen Description URINE, RANDOM  Final   Special Requests NONE  Final   Culture >=100,000 COLONIES/mL ESCHERICHIA COLI (A)  Final   Report Status 03/08/2016 FINAL  Final    Organism ID, Bacteria ESCHERICHIA COLI (A)  Final      Susceptibility   Escherichia coli - MIC*    AMPICILLIN <=2 SENSITIVE Sensitive     CEFAZOLIN <=4 SENSITIVE Sensitive     CEFTRIAXONE <=1 SENSITIVE Sensitive     CIPROFLOXACIN <=0.25 SENSITIVE Sensitive     GENTAMICIN <=1 SENSITIVE Sensitive     IMIPENEM <=0.25 SENSITIVE Sensitive     NITROFURANTOIN <=16 SENSITIVE Sensitive     TRIMETH/SULFA <=20 SENSITIVE Sensitive     AMPICILLIN/SULBACTAM <=2 SENSITIVE Sensitive     PIP/TAZO <=4 SENSITIVE Sensitive     Extended ESBL NEGATIVE Sensitive     * >=100,000 COLONIES/mL ESCHERICHIA COLI  Blood Culture ID Panel (Reflexed)     Status: Abnormal   Collection Time: 03/06/16  1:13 PM  Result Value Ref Range Status   Enterococcus species NOT DETECTED NOT DETECTED Final   Vancomycin resistance NOT DETECTED NOT DETECTED Final   Listeria monocytogenes NOT DETECTED NOT DETECTED Final   Staphylococcus species NOT DETECTED NOT DETECTED Final   Staphylococcus aureus NOT DETECTED NOT DETECTED Final   Methicillin resistance NOT DETECTED NOT DETECTED Final   Streptococcus species NOT DETECTED NOT DETECTED Final   Streptococcus agalactiae NOT DETECTED NOT DETECTED Final   Streptococcus pneumoniae NOT DETECTED NOT DETECTED Final   Streptococcus pyogenes NOT DETECTED NOT DETECTED Final   Acinetobacter baumannii NOT DETECTED NOT DETECTED Final   Enterobacteriaceae species DETECTED (A) NOT DETECTED Final    Comment: CRITICAL RESULT CALLED TO, READ BACK BY AND VERIFIED WITH: MATT MCBANE AT 0344 03/07/16.PMH    Enterobacter cloacae complex NOT DETECTED NOT DETECTED Final   Escherichia coli DETECTED (A) NOT DETECTED Final    Comment: CRITICAL RESULT CALLED TO, READ BACK BY AND VERIFIED WITH: MATT MCBANE AT 0344 03/07/16.PMH    Klebsiella oxytoca NOT DETECTED NOT DETECTED Final   Klebsiella pneumoniae NOT DETECTED NOT DETECTED Final   Proteus species NOT DETECTED NOT DETECTED Final   Serratia marcescens  NOT DETECTED NOT DETECTED Final   Carbapenem resistance NOT DETECTED NOT DETECTED Final   Haemophilus influenzae NOT DETECTED NOT DETECTED Final   Neisseria meningitidis NOT DETECTED NOT DETECTED Final   Pseudomonas aeruginosa NOT DETECTED NOT DETECTED Final   Candida albicans NOT DETECTED NOT DETECTED Final   Candida glabrata NOT DETECTED NOT DETECTED Final   Candida krusei NOT DETECTED NOT DETECTED Final   Candida parapsilosis NOT DETECTED NOT DETECTED Final   Candida tropicalis NOT DETECTED NOT DETECTED Final  Blood culture (routine x 2)  Status: None (Preliminary result)   Collection Time: 03/06/16  1:32 PM  Result Value Ref Range Status   Specimen Description BLOOD RIGHT ANTECUBITAL  Final   Special Requests BOTTLES DRAWN AEROBIC AND ANAEROBIC  2CC  Final   Culture NO GROWTH < 24 HOURS  Final   Report Status PENDING  Incomplete  Blood culture (routine x 2)     Status: None (Preliminary result)   Collection Time: 03/07/16  6:20 AM  Result Value Ref Range Status   Specimen Description BLOOD RIGHT ARM  Final   Special Requests BOTTLES DRAWN AEROBIC AND ANAEROBIC 5CC  Final   Culture NO GROWTH 1 DAY  Final   Report Status PENDING  Incomplete  Blood culture (routine x 2)     Status: None (Preliminary result)   Collection Time: 03/07/16  6:54 AM  Result Value Ref Range Status   Specimen Description BLOOD LEFT ANTECUBITAL  Final   Special Requests BOTTLES DRAWN AEROBIC AND ANAEROBIC 10CC  Final   Culture NO GROWTH 1 DAY  Final   Report Status PENDING  Incomplete  Urine culture     Status: Abnormal   Collection Time: 03/07/16  7:15 AM  Result Value Ref Range Status   Specimen Description URINE, CATHETERIZED  Final   Special Requests NONE  Final   Culture 1,000 COLONIES/mL INSIGNIFICANT GROWTH (A)  Final   Report Status 03/08/2016 FINAL  Final     Studies: No results found.  Scheduled Meds: . allopurinol  100 mg Oral Daily  . ceFEPime (MAXIPIME) IV  2 g Intravenous Q0600    . furosemide  80 mg Oral BID  . guaiFENesin  600 mg Oral BID  . heparin  5,000 Units Subcutaneous 3 times per day  . levothyroxine  137 mcg Oral QAC breakfast  . LORazepam  1 mg Oral QHS  . losartan  100 mg Oral Daily  . pantoprazole  40 mg Oral Daily  . sertraline  50 mg Oral Daily    Continuous Infusions:    Time spent:  Lorance Pickeral MD, PhD  [facility] Pager [phone]. If 7PM-7AM, please contact night-coverage at [email or web address], password [facility] 03/08/2016, 4:22 PM  LOS: 1 day

## 2016-03-08 NOTE — Progress Notes (Signed)
Came to assess pt readiness for NIV, on arrival pt was already on NIV. Pt is stable at this time RN at bedside.

## 2016-03-09 DIAGNOSIS — B9620 Unspecified Escherichia coli [E. coli] as the cause of diseases classified elsewhere: Secondary | ICD-10-CM

## 2016-03-09 DIAGNOSIS — R7881 Bacteremia: Secondary | ICD-10-CM

## 2016-03-09 DIAGNOSIS — N390 Urinary tract infection, site not specified: Principal | ICD-10-CM

## 2016-03-09 DIAGNOSIS — D72829 Elevated white blood cell count, unspecified: Secondary | ICD-10-CM

## 2016-03-09 DIAGNOSIS — I5032 Chronic diastolic (congestive) heart failure: Secondary | ICD-10-CM

## 2016-03-09 LAB — BASIC METABOLIC PANEL
Anion gap: 14 (ref 5–15)
BUN: 38 mg/dL — AB (ref 6–20)
CALCIUM: 8.6 mg/dL — AB (ref 8.9–10.3)
CHLORIDE: 110 mmol/L (ref 101–111)
CO2: 22 mmol/L (ref 22–32)
CREATININE: 1.4 mg/dL — AB (ref 0.44–1.00)
GFR calc Af Amer: 38 mL/min — ABNORMAL LOW (ref >60)
GFR calc non Af Amer: 33 mL/min — ABNORMAL LOW (ref >60)
Glucose, Bld: 166 mg/dL — ABNORMAL HIGH (ref 65–99)
Potassium: 4.5 mmol/L (ref 3.5–5.1)
SODIUM: 146 mmol/L — AB (ref 135–145)

## 2016-03-09 LAB — CBC
HEMATOCRIT: 38.5 % (ref 36.0–46.0)
HEMOGLOBIN: 12.1 g/dL (ref 12.0–15.0)
MCH: 30.3 pg (ref 26.0–34.0)
MCHC: 31.4 g/dL (ref 30.0–36.0)
MCV: 96.5 fL (ref 78.0–100.0)
Platelets: 128 10*3/uL — ABNORMAL LOW (ref 150–400)
RBC: 3.99 MIL/uL (ref 3.87–5.11)
RDW: 14.2 % (ref 11.5–15.5)
WBC: 11.2 10*3/uL — AB (ref 4.0–10.5)

## 2016-03-09 MED ORDER — CIPROFLOXACIN HCL 500 MG PO TABS: 500.0000 mg | ORAL_TABLET | Freq: Two times a day (BID) | ORAL | Status: DC

## 2016-03-09 MED ORDER — CEFAZOLIN SODIUM-DEXTROSE 2-4 GM/100ML-% IV SOLN
2.0000 g | Freq: Three times a day (TID) | INTRAVENOUS | Status: DC
  Administered 2016-03-09: 2 g via INTRAVENOUS
  Filled 2016-03-09 (×4): qty 100

## 2016-03-09 MED ORDER — GUAIFENESIN ER 600 MG PO TB12: 600.0000 mg | ORAL_TABLET | Freq: Two times a day (BID) | ORAL | Status: DC | PRN

## 2016-03-09 NOTE — Progress Notes (Signed)
Pharmacy Antibiotic Note  [name] is a 82 y.o. female admitted on 03/07/2016 with Ecoli bacteremia - seen on rapid culture identification [facility]. Pharmacy was originally consulted for Cefepime dosing.  The blood and urine cultures from [place] have now resulted as pan-sensitive E.coli. This was discussed with Dr. Elisabeth Pigeonevine today and plans have been made to transition the patient to Cefazolin. Today is total abx D#4 of recommended LOT of ~10-14 days for bacteremia treatment. WBC is still slightly elevated at 11.2 - once normalized, could consider transition to po antibiotics.   Plan: 1. Start Cefazolin 2g IV every 8 hours 2. Will continue to follow renal function, culture results, LOT, and antibiotic de-escalation plans   Height: 5\' 4"  (162.6 cm) Weight: 252 lb 6.8 oz (114.5 kg) IBW/kg (Calculated) : 54.7  Temp (24hrs), Avg:98.2 F (36.8 C), Min:97.5 F (36.4 C), Max:99.2 F (37.3 C)   Recent Labs Lab 03/06/16 1228 03/07/16 0620 03/07/16 0702 03/07/16 1133 03/08/16 0544 03/09/16 0859  WBC 15.8* 20.7*  --  17.1* 12.3* 11.2*  CREATININE 1.68* 1.85* 1.80* 1.88* 1.54* 1.40*  LATICACIDVEN  --   --  1.37  --   --   --     Estimated Creatinine Clearance: 35.8 mL/min (by C-G formula based on Cr of 1.4).    Allergies  Allergen Reactions  . Gabapentin Other (See Comments)    Alters mood  . Lyrica [Pregabalin] Other (See Comments)    Lethargic, mood changes  . Morphine And Related Hives and Itching  . Lipitor [Atorvastatin] Itching and Rash  . Sulfa Antibiotics Rash    Antimicrobials this admission: Rocephin 4/9 x 1 dose Cefepime 4/10 >> 4/12 Cefazolin 4/12 >>  Dose adjustments this admission: n/a  Microbiology results: 4/9 ([place]) BCx x2 >> Ecoli (pan-S) 4/9 ([place]) UCx >> E. Coli (pan-S) 4/10 UCx >> 1k insignificant 4/10 BCx x 2 >> ngtd  Thank you for allowing pharmacy to be a part of this patient's care.  Georgina PillionElizabeth  Chasty Randal, PharmD, BCPS Clinical Pharmacist Pager: [phone] 03/09/2016 11:25 AM

## 2016-03-09 NOTE — Discharge Summary (Signed)
Physician Discharge Summary  [name] WNU:[id number] DOB: [birth year] DOA: 03/07/2016  PCP: Lillia Mountain, MD  Admit date: 03/07/2016 Discharge date: 03/09/2016  Recommendations for Outpatient Follow-up:  Urine culture showed insignificant bacterial growth. Patient does not need antibiotics on discharge. Please hold losartan on discharge since patient is already on Lasix. Both medications can contribute to renal dysfunction. Creatinine is 1.4 prior to discharge. Patient can continue Lasix per home regimen. Blood pressure is well controlled with Lasix only, 137/63. Please follow-up with primary care physician in regards to follow-up CBC and BMP. Hemoglobin is 12.1 on discharge and sodium is 146. Please repeat the dose labs on outpatient basis.  Discharge Diagnoses:  Principal Problem:   Bacteremia Active Problems:   Atypical chest pain   Acute-on-chronic kidney injury (HCC)   Essential hypertension   Chronic diastolic congestive heart failure (HCC)   Hypothyroidism   Depression with anxiety   UTI (lower urinary tract infection)    Discharge Condition: stable   Diet recommendation: as tolerated   History of present illness:  Per HPI on admission: "82 y.o. female  Patient presenting to [facility] emergency room after being called by hospital staff at [facility] for positive blood cultures. Patient was seen at [facility] on 03/06/2016 and was diagnosed with a UTI. At that time blood cultures were drawn which came back positive today showing Escherichia coli and Enterobacter. During time [place] [facility] patient was treated with IV ceftriaxone and sent home with Keflex. Patient reports compliance with Keflex. At time of presentation [place] [facility] patient was offered admission but she declined.  Patient's symptoms started Sunday while attending church services. She states that she began to feel weak and shaky all over. Shaking persisted and involved the  entire body which prompted her family and other church members to take patient to the local fire department. EMS was called and patient was taken out as regional. Patient continues to feel intermittently shaky and progressively more weak and fatigued ever since being seen at [facility]. Patient denies any chest pain outside of her baseline episodic chest pain episodes which is been worked up significantly. Patient denies any shortness breath, cough, nausea, vomiting, diarrhea, LOC, dizziness, headache, neck stiffness, lower extremity swelling above baseline, dysuria, frequency, back pain/flank pain."  Hospital Course:   E coli urinary tract infection / Escherichia coli bacteremia / leukocytosis - Urine culture and blood cultures collected on the admission are growing Escherichia coli - Repeat urine culture and repeat blood cultures  Showed insignificant growth and no growth respectively - Patient was on cefepime since the time of the admission through today prior to discharge. She will continue ciprofloxacin for 8 days on discharge to complete a total of 10 day treatment for Escherichia coli UTI and bacteremia  Chronic diastolic CHF - Continue lasix per home regimen - Compensated   Hypothyroidism - Continue synthroid   Code Status: full Family Communication: patient and two duaghters   Consultants:  none  Procedures:  none  Antibiotics:  Cefepime from admission through 03/09/2016   Signed:  Manson Passey, MD  [facility] 03/09/2016, 11:45 AM  Pager #: [phone]  Time spent in minutes: more than 30 minutes   Discharge Exam: Filed Vitals:   03/09/16 0532 03/09/16 0900  BP: 132/53 137/63  Pulse: 68 70  Temp: 98.2 F (36.8 C) 97.7 F (36.5 C)  Resp: 19 20   Filed Vitals:   03/08/16 1817 03/08/16 2036 03/09/16 0532 03/09/16 0900  BP: 154/70 134/60 132/53  137/63  Pulse: 73 66 68 70  Temp: 99.2 F (37.3 C) 97.5 F (36.4 C) 98.2 F (36.8 C) 97.7 F  (36.5 C)  TempSrc: Oral Oral Oral Oral  Resp: Height:      Weight:  114.5 kg (252 lb 6.8 oz)    SpO2: 96% 79% 98% 98%    General: Pt is alert, follows commands appropriately, not in acute distress Cardiovascular: Regular rate and rhythm, S1/S2 + Respiratory: Clear to auscultation bilaterally, no wheezing, no crackles, no rhonchi Abdominal: Soft, non tender, non distended, bowel sounds +, no guarding Extremities: no cyanosis, pulses palpable bilaterally DP and PT Neuro: Grossly nonfocal  Discharge Instructions  Discharge Instructions    Call MD for:  difficulty breathing, headache or visual disturbances    Complete by:  As directed      Call MD for:  persistant dizziness or light-headedness    Complete by:  As directed      Call MD for:  persistant nausea and vomiting    Complete by:  As directed      Call MD for:  redness, tenderness, or signs of infection (pain, swelling, redness, odor or green/yellow discharge around incision site)    Complete by:  As directed      Diet - low sodium heart healthy    Complete by:  As directed      Discharge instructions    Complete by:  As directed   Urine culture showed insignificant bacterial growth. Patient does not need antibiotics on discharge. Please hold losartan on discharge since patient is already on Lasix. Both medications can contribute to renal dysfunction. Creatinine is 1.4 prior to discharge. Patient can continue Lasix per home regimen. Blood pressure is well controlled with Lasix only, 137/63. Please follow-up with primary care physician in regards to follow-up CBC and BMP. Hemoglobin is 12.1 on discharge and sodium is 146. Please repeat the dose labs on outpatient basis.     Increase activity slowly    Complete by:  As directed             Medication List    STOP taking these medications        losartan 100 MG tablet  Commonly known as:  COZAAR      TAKE these medications        allopurinol 100 MG  tablet  Commonly known as:  ZYLOPRIM  Take 100 mg by mouth daily.     calcium citrate-vitamin D 315-200 MG-UNIT tablet  Commonly known as:  CITRACAL+D  Take 1 tablet by mouth daily.     furosemide 80 MG tablet  Commonly known as:  LASIX  Take 80 mg by mouth 2 (two) times daily.     GLUCOSAMINE CHONDROITIN VIT D3 Caps  Take 1 capsule by mouth daily.     guaiFENesin 600 MG 12 hr tablet  Commonly known as:  MUCINEX  Take 1 tablet (600 mg total) by mouth 2 (two) times daily as needed for cough or to loosen phlegm.     levothyroxine 137 MCG tablet  Commonly known as:  SYNTHROID, LEVOTHROID  Take 137 mcg by mouth daily before breakfast.     LORazepam 1 MG tablet  Commonly known as:  ATIVAN  Take 1 mg by mouth at bedtime.     multivitamin with minerals Tabs tablet  Take 1 tablet by mouth daily.     nitroGLYCERIN 0.4 MG SL tablet  Commonly known as:  NITROSTAT  Place 0.4 mg under the tongue every 5 (five) minutes as needed for chest pain.     omeprazole 40 MG capsule  Commonly known as:  PRILOSEC  Take 40 mg by mouth daily.     sertraline 50 MG tablet  Commonly known as:  ZOLOFT  Take 50 mg by mouth daily.           Follow-up Information    Follow up with Lillia MountainGRIFFIN,JOHN JOSEPH, MD. Schedule an appointment as soon as possible for a visit in 1 week.   Specialty:  Internal Medicine   Why:  Follow up appt after recent hospitalization   Contact information:   [address] [place] [address] [phone]        The results of significant diagnostics from this hospitalization (including imaging, microbiology, ancillary and laboratory) are listed below for reference.    Significant Diagnostic Studies: Dg Chest 2 View  03/06/2016  CLINICAL DATA:  Pt states she was diagnosed with bronchitis a week ago- previous chest xray done. Pt states this am she started to feel light-headed, dizzy and some chest discomfort. Hx of sleep apnea, HTN. Former smoker. EXAM:  CHEST  2 VIEW COMPARISON:  02/22/2016 FINDINGS: Cardiac silhouette is normal in size and configuration. The aorta is uncoiled and tortuous. No mediastinal or hilar masses or evidence of adenopathy. There are clear lungs. No pleural effusion or pneumothorax. Bony thorax is demineralized but grossly intact. IMPRESSION: No acute cardiopulmonary disease. Electronically Signed   By: Amie Portlandavid  Ormond M.D.   On: 03/06/2016 13:06   Dg Chest 2 View  02/22/2016  CLINICAL DATA:  Cough and wheezing for the past 2 weeks, history of morbid obesity, previous CVA, former smoker. EXAM: CHEST  2 VIEW COMPARISON:  Chest x-ray of February 24, 2011 FINDINGS: The lungs are adequately inflated. There is patchy increased density in the right suprahilar and infrahilar regions. The left lung is clear. The heart is top normal in size. The pulmonary vascularity is not engorged. There is tortuosity of the descending thoracic aorta. There is mild multilevel degenerative disc disease of the thoracic spine. IMPRESSION: Perihilar interstitial density may reflect subsegmental atelectasis or early interstitial pneumonia on the right. The left lung is clear. Followup PA and lateral chest X-ray is recommended in 3-4 weeks following trial of antibiotic therapy to ensure resolution and exclude underlying malignancy. Electronically Signed   By: David  Swazil[place] M.D.   On: 02/22/2016 13:27    Microbiology: Recent Results (from the past 240 hour(s))  Blood culture (routine x 2)     Status: Abnormal (Preliminary result)   Collection Time: 03/06/16  1:13 PM  Result Value Ref Range Status   Specimen Description BLOOD LEFT ANTECUBITAL  Final   Special Requests BOTTLES DRAWN AEROBIC AND ANAEROBIC  2CC  Final   Culture  Setup Time   Final    GRAM NEGATIVE RODS ANAEROBIC BOTTLE ONLY CRITICAL RESULT CALLED TO, READ BACK BY AND VERIFIED WITH: MATT MCBANE AT 0344 03/07/16.PMH CONFIRMED BY [facility] Organism ID to follow    Culture ESCHERICHIA COLI ANAEROBIC  BOTTLE ONLY  (A)  Final   Report Status PENDING  Incomplete   Organism ID, Bacteria ESCHERICHIA COLI  Final      Susceptibility   Escherichia coli - MIC*    AMPICILLIN <=2 SENSITIVE Sensitive     CEFAZOLIN <=4 SENSITIVE Sensitive     CEFEPIME <=1 SENSITIVE Sensitive     CEFTAZIDIME <=1 SENSITIVE Sensitive     CEFTRIAXONE <=  1 SENSITIVE Sensitive     CIPROFLOXACIN <=0.25 SENSITIVE Sensitive     GENTAMICIN <=1 SENSITIVE Sensitive     IMIPENEM <=0.25 SENSITIVE Sensitive     TRIMETH/SULFA <=20 SENSITIVE Sensitive     AMPICILLIN/SULBACTAM <=2 SENSITIVE Sensitive     PIP/TAZO <=4 SENSITIVE Sensitive     Extended ESBL NEGATIVE Sensitive     * ESCHERICHIA COLI  Urine culture     Status: Abnormal   Collection Time: 03/06/16  1:13 PM  Result Value Ref Range Status   Specimen Description URINE, RANDOM  Final   Special Requests NONE  Final   Culture >=100,000 COLONIES/mL ESCHERICHIA COLI (A)  Final   Report Status 03/08/2016 FINAL  Final   Organism ID, Bacteria ESCHERICHIA COLI (A)  Final      Susceptibility   Escherichia coli - MIC*    AMPICILLIN <=2 SENSITIVE Sensitive     CEFAZOLIN <=4 SENSITIVE Sensitive     CEFTRIAXONE <=1 SENSITIVE Sensitive     CIPROFLOXACIN <=0.25 SENSITIVE Sensitive     GENTAMICIN <=1 SENSITIVE Sensitive     IMIPENEM <=0.25 SENSITIVE Sensitive     NITROFURANTOIN <=16 SENSITIVE Sensitive     TRIMETH/SULFA <=20 SENSITIVE Sensitive     AMPICILLIN/SULBACTAM <=2 SENSITIVE Sensitive     PIP/TAZO <=4 SENSITIVE Sensitive     Extended ESBL NEGATIVE Sensitive     * >=100,000 COLONIES/mL ESCHERICHIA COLI  Blood Culture ID Panel (Reflexed)     Status: Abnormal   Collection Time: 03/06/16  1:13 PM  Result Value Ref Range Status   Enterococcus species NOT DETECTED NOT DETECTED Final   Vancomycin resistance NOT DETECTED NOT DETECTED Final   Listeria monocytogenes NOT DETECTED NOT DETECTED Final   Staphylococcus species NOT DETECTED NOT DETECTED Final   Staphylococcus  aureus NOT DETECTED NOT DETECTED Final   Methicillin resistance NOT DETECTED NOT DETECTED Final   Streptococcus species NOT DETECTED NOT DETECTED Final   Streptococcus agalactiae NOT DETECTED NOT DETECTED Final   Streptococcus pneumoniae NOT DETECTED NOT DETECTED Final   Streptococcus pyogenes NOT DETECTED NOT DETECTED Final   Acinetobacter baumannii NOT DETECTED NOT DETECTED Final   Enterobacteriaceae species DETECTED (A) NOT DETECTED Final    Comment: CRITICAL RESULT CALLED TO, READ BACK BY AND VERIFIED WITH: MATT MCBANE AT 0344 03/07/16.PMH    Enterobacter cloacae complex NOT DETECTED NOT DETECTED Final   Escherichia coli DETECTED (A) NOT DETECTED Final    Comment: CRITICAL RESULT CALLED TO, READ BACK BY AND VERIFIED WITH: MATT MCBANE AT 0344 03/07/16.PMH    Klebsiella oxytoca NOT DETECTED NOT DETECTED Final   Klebsiella pneumoniae NOT DETECTED NOT DETECTED Final   Proteus species NOT DETECTED NOT DETECTED Final   Serratia marcescens NOT DETECTED NOT DETECTED Final   Carbapenem resistance NOT DETECTED NOT DETECTED Final   Haemophilus influenzae NOT DETECTED NOT DETECTED Final   Neisseria meningitidis NOT DETECTED NOT DETECTED Final   Pseudomonas aeruginosa NOT DETECTED NOT DETECTED Final   Candida albicans NOT DETECTED NOT DETECTED Final   Candida glabrata NOT DETECTED NOT DETECTED Final   Candida krusei NOT DETECTED NOT DETECTED Final   Candida parapsilosis NOT DETECTED NOT DETECTED Final   Candida tropicalis NOT DETECTED NOT DETECTED Final  Blood culture (routine x 2)     Status: None (Preliminary result)   Collection Time: 03/06/16  1:32 PM  Result Value Ref Range Status   Specimen Description BLOOD RIGHT ANTECUBITAL  Final   Special Requests BOTTLES DRAWN AEROBIC AND ANAEROBIC  2CC  Final   Culture NO GROWTH < 24 HOURS  Final   Report Status PENDING  Incomplete  Blood culture (routine x 2)     Status: None (Preliminary result)   Collection Time: 03/07/16  6:20 AM  Result  Value Ref Range Status   Specimen Description BLOOD RIGHT ARM  Final   Special Requests BOTTLES DRAWN AEROBIC AND ANAEROBIC 5CC  Final   Culture NO GROWTH 1 DAY  Final   Report Status PENDING  Incomplete  Blood culture (routine x 2)     Status: None (Preliminary result)   Collection Time: 03/07/16  6:54 AM  Result Value Ref Range Status   Specimen Description BLOOD LEFT ANTECUBITAL  Final   Special Requests BOTTLES DRAWN AEROBIC AND ANAEROBIC 10CC  Final   Culture NO GROWTH 1 DAY  Final   Report Status PENDING  Incomplete  Urine culture     Status: Abnormal   Collection Time: 03/07/16  7:15 AM  Result Value Ref Range Status   Specimen Description URINE, CATHETERIZED  Final   Special Requests NONE  Final   Culture 1,000 COLONIES/mL INSIGNIFICANT GROWTH (A)  Final   Report Status 03/08/2016 FINAL  Final     Labs: Basic Metabolic Panel:  Recent Labs Lab 03/06/16 1228 03/07/16 0620 03/07/16 0702 03/07/16 1133 03/08/16 0544 03/09/16 0859  NA 137 141 140  --  147* 146*  K 4.2 4.1 4.0  --  3.9 4.5  CL 104 106 104  --  115* 110  CO2 22 22  --   --  21* 22  GLUCOSE 133* 132* 126*  --  125* 166*  BUN 68* 66* 61*  --  49* 38*  CREATININE 1.68* 1.85* 1.80* 1.88* 1.54* 1.40*  CALCIUM 9.0 8.4*  --   --  8.1* 8.6*   Liver Function Tests:  Recent Labs Lab 03/07/16 0620 03/08/16 0544  AST 27 17  ALT 26 20  ALKPHOS 91 78  BILITOT 0.4 0.4  PROT 5.6* 5.4*  ALBUMIN 2.8* 2.3*   No results for input(s): LIPASE, AMYLASE in the last 168 hours. No results for input(s): AMMONIA in the last 168 hours. CBC:  Recent Labs Lab 03/06/16 1228 03/07/16 0620 03/07/16 0702 03/07/16 1133 03/08/16 0544 03/09/16 0859  WBC 15.8* 20.7*  --  17.1* 12.3* 11.2*  NEUTROABS  --  15.7*  --   --   --   --   HGB 13.1 10.9* 12.2 11.0* 10.6* 12.1  HCT 40.2 34.0* 36.0 35.2* 32.4* 38.5  MCV 94.1 93.4  --  94.1 94.2 96.5  PLT 149* 125*  --  113* 114* 128*   Cardiac Enzymes:  Recent Labs Lab  03/06/16 1228  TROPONINI <0.03   BNP: BNP (last 3 results) No results for input(s): BNP in the last 8760 hours.  ProBNP (last 3 results) No results for input(s): PROBNP in the last 8760 hours.  CBG:  Recent Labs Lab 03/07/16 1532 03/07/16 2054  GLUCAP 140* 133*

## 2016-03-09 NOTE — Progress Notes (Signed)
Patient is being d/c home. D/c instructions given patient and care giver verbalized understanding.

## 2016-03-09 NOTE — Discharge Instructions (Signed)
Furosemide tablets °What is this medicine? °FUROSEMIDE (fyoor [place] se mide) is a diuretic. It helps you make more urine and to lose salt and excess water from your body. This medicine is used to treat high blood pressure, and edema or swelling from heart, kidney, or liver disease. °This medicine may be used for other purposes; ask your health care provider or pharmacist if you have questions. °What should I tell my health care provider before I take this medicine? °They need to know if you have any of these conditions: °-abnormal blood electrolytes °-diarrhea or vomiting °-gout °-heart disease °-kidney disease, small amounts of urine, or difficulty passing urine °-liver disease °-thyroid disease °-an unusual or allergic reaction to furosemide, sulfa drugs, other medicines, foods, dyes, or preservatives °-pregnant or trying to get pregnant °-breast-feeding °How should I use this medicine? °Take this medicine by mouth with a glass of water. Follow the directions on the prescription label. You may take this medicine with or without food. If it upsets your stomach, take it with food or milk. Do not take your medicine more often than directed. Remember that you will need to pass more urine after taking this medicine. Do not take your medicine at a time of day that will cause you problems. Do not take at bedtime. °Talk to your pediatrician regarding the use of this medicine in children. While this drug may be prescribed for selected conditions, precautions do apply. °Overdosage: If you think you have taken too much of this medicine contact a poison control center or emergency room at once. °NOTE: This medicine is only for you. Do not share this medicine with others. °What if I miss a dose? °If you miss a dose, take it as soon as you can. If it is almost time for your next dose, take only that dose. Do not take double or extra doses. °What may interact with this medicine? °-aspirin and aspirin-like medicines °-certain  antibiotics °-chloral hydrate °-cisplatin °-cyclosporine °-digoxin °-diuretics °-laxatives °-lithium °-medicines for blood pressure °-medicines that relax muscles for surgery °-methotrexate °-NSAIDs, medicines for pain and inflammation like ibuprofen, naproxen, or indomethacin °-phenytoin °-steroid medicines like prednisone or cortisone °-sucralfate °-thyroid hormones °This list may not describe all possible interactions. Give your health care provider a list of all the medicines, herbs, non-prescription drugs, or dietary supplements you use. Also tell them if you smoke, drink alcohol, or use illegal drugs. Some items may interact with your medicine. °What should I watch for while using this medicine? °Visit your doctor or health care professional for regular checks on your progress. Check your blood pressure regularly. Ask your doctor or health care professional what your blood pressure should be, and when you should contact him or her. If you are a diabetic, check your blood sugar as directed. °You may need to be on a special diet while taking this medicine. Check with your doctor. Also, ask how many glasses of fluid you need to drink a day. You must not get dehydrated. °You may get drowsy or dizzy. Do not drive, use machinery, or do anything that needs mental alertness until you know how this drug affects you. Do not stand or sit up quickly, especially if you are an older patient. This reduces the risk of dizzy or fainting spells. Alcohol can make you more drowsy and dizzy. Avoid alcoholic drinks. °This medicine can make you more sensitive to the sun. Keep out of the sun. If you cannot avoid being in the sun, wear protective clothing and   use sunscreen. Do not use sun lamps or tanning beds/booths. °What side effects may I notice from receiving this medicine? °Side effects that you should report to your doctor or health care professional as soon as possible: °-blood in urine or stools °-dry mouth °-fever or  chills °-hearing loss or ringing in the ears °-irregular heartbeat °-muscle pain or weakness, cramps °-skin rash °-stomach upset, pain, or nausea °-tingling or numbness in the hands or feet °-unusually weak or tired °-vomiting or diarrhea °-yellowing of the eyes or skin °Side effects that usually do not require medical attention (report to your doctor or health care professional if they continue or are bothersome): °-headache °-loss of appetite °-unusual bleeding or bruising °This list may not describe all possible side effects. Call your doctor for medical advice about side effects. You may report side effects to FDA at 1-800-FDA-1088. °Where should I keep my medicine? °Keep out of the reach of children. °Store at room temperature between 15 and 30 degrees C (59 and 86 degrees F). Protect from light. Throw away any unused medicine after the expiration date. °NOTE: This sheet is a summary. It may not cover all possible information. If you have questions about this medicine, talk to your doctor, pharmacist, or health care provider. °  °© 2016, [employer]. (2015-02-04 13:49:50) ° °

## 2016-03-10 LAB — CULTURE, BLOOD (ROUTINE X 2)

## 2016-03-12 LAB — CULTURE, BLOOD (ROUTINE X 2)
Culture: NO GROWTH
Culture: NO GROWTH

## 2016-03-14 LAB — CULTURE, BLOOD (ROUTINE X 2): Culture: NO GROWTH

## 2016-05-16 ENCOUNTER — Other Ambulatory Visit: Payer: Self-pay | Admitting: Nurse Practitioner

## 2016-05-16 ENCOUNTER — Ambulatory Visit
Admission: RE | Admit: 2016-05-16 | Discharge: 2016-05-16 | Disposition: A | Discharge status: Home / Self Care | Payer: Medicare Other | Source: Ambulatory Visit | Attending: Nurse Practitioner | Admitting: Nurse Practitioner

## 2016-05-16 DIAGNOSIS — R0602 Shortness of breath: Secondary | ICD-10-CM

## 2017-04-16 ENCOUNTER — Encounter (HOSPITAL_COMMUNITY): Payer: Self-pay | Admitting: Emergency Medicine

## 2017-04-16 ENCOUNTER — Emergency Department (HOSPITAL_COMMUNITY)
Admission: EM | Admit: 2017-04-16 | Discharge: 2017-04-16 | Disposition: A | Discharge status: Home / Self Care | Payer: Medicare Other | Attending: Emergency Medicine | Admitting: Emergency Medicine

## 2017-04-16 DIAGNOSIS — E039 Hypothyroidism, unspecified: Secondary | ICD-10-CM | POA: Insufficient documentation

## 2017-04-16 DIAGNOSIS — Z23 Encounter for immunization: Secondary | ICD-10-CM | POA: Insufficient documentation

## 2017-04-16 DIAGNOSIS — L539 Erythematous condition, unspecified: Secondary | ICD-10-CM | POA: Diagnosis present

## 2017-04-16 DIAGNOSIS — I5032 Chronic diastolic (congestive) heart failure: Secondary | ICD-10-CM | POA: Diagnosis not present

## 2017-04-16 DIAGNOSIS — Z79899 Other long term (current) drug therapy: Secondary | ICD-10-CM | POA: Diagnosis not present

## 2017-04-16 DIAGNOSIS — L0291 Cutaneous abscess, unspecified: Secondary | ICD-10-CM

## 2017-04-16 DIAGNOSIS — I110 Hypertensive heart disease with heart failure: Secondary | ICD-10-CM | POA: Insufficient documentation

## 2017-04-16 DIAGNOSIS — L03113 Cellulitis of right upper limb: Secondary | ICD-10-CM | POA: Diagnosis not present

## 2017-04-16 DIAGNOSIS — Z87891 Personal history of nicotine dependence: Secondary | ICD-10-CM | POA: Insufficient documentation

## 2017-04-16 DIAGNOSIS — L02413 Cutaneous abscess of right upper limb: Secondary | ICD-10-CM | POA: Insufficient documentation

## 2017-04-16 MED ORDER — CEPHALEXIN 500 MG PO CAPS: 500.0000 mg | ORAL_CAPSULE | Freq: Once | ORAL | Status: DC

## 2017-04-16 MED ORDER — DOXYCYCLINE HYCLATE 100 MG PO TABS
100.0000 mg | ORAL_TABLET | Freq: Once | ORAL | Status: AC
  Administered 2017-04-16: 100 mg via ORAL
  Filled 2017-04-16: qty 1

## 2017-04-16 MED ORDER — TETANUS-DIPHTH-ACELL PERTUSSIS 5-2.5-18.5 LF-MCG/0.5 IM SUSP
0.5000 mL | Freq: Once | INTRAMUSCULAR | Status: AC
  Administered 2017-04-16: 0.5 mL via INTRAMUSCULAR
  Filled 2017-04-16: qty 0.5

## 2017-04-16 MED ORDER — LIDOCAINE-EPINEPHRINE (PF) 2 %-1:200000 IJ SOLN
10.0000 mL | Freq: Once | INTRAMUSCULAR | Status: AC
  Administered 2017-04-16: 10 mL
  Filled 2017-04-16: qty 20

## 2017-04-16 MED ORDER — POVIDONE-IODINE 10 % EX SOLN
CUTANEOUS | Status: AC
  Administered 2017-04-16: 1
  Filled 2017-04-16: qty 118

## 2017-04-16 MED ORDER — DOXYCYCLINE HYCLATE 100 MG PO CAPS: 100.0000 mg | ORAL_CAPSULE | Freq: Two times a day (BID) | ORAL | 0 refills | Status: DC

## 2017-04-16 NOTE — Discharge Instructions (Signed)
Hold the wound on your right forearm under the faucet 4 times daily for 30 minutes at a time with warm water, and cover with a bandage. Get your wound rechecked in 2 days by your doctor at an urgent care center. Return for fever, vomiting or if you feel that your condition is worsening for any reason. Take the antibiotic as prescribed. Take Tylenol as directed for pain

## 2017-04-16 NOTE — ED Provider Notes (Signed)
[facility]-EMERGENCY DEPT Provider Note   CSN: [id number] Arrival date & time: 04/16/17  1318  By signing my name below, I, [name], attest that this documentation has been prepared under the direction and in the presence of Doug Sou, MD. Electronically Signed: Doreatha Castaneda, ED Scribe. 04/16/17. 1:50 PM.     History   Chief Complaint Chief Complaint  Patient presents with  . Insect Bite    HPI [name] is a 83 y.o. female who presents to the Emergency Department complaining of a gradually worsening area of redness, swelling and pain to the right Forearm that began a week ago. Pt states she does not specifically remember an insect bite, but reports she was outside prior to the onset of her symptoms. Pt also reports shooting pains from the area up her arm. She has tried OTC bug bite and boil cream with inadequate relief. No h/o DM. Pt is a non-smoker and non-drinker. Tdap out of date. She denies additional associated symptoms.   The history is provided by the patient. No language interpreter was used.    Past Medical History:  Diagnosis Date  . Chest pain   . Depression   . GERD (gastroesophageal reflux disease)   . Hypertension   . Sleep apnea   . Thyroid disease     Patient Active Problem List   Diagnosis Date Noted  . Bacteremia 03/07/2016  . Atypical chest pain 03/07/2016  . Acute-on-chronic kidney injury (HCC) 03/07/2016  . Essential hypertension 03/07/2016  . Chronic diastolic congestive heart failure (HCC) 03/07/2016  . Hypothyroidism 03/07/2016  . Depression with anxiety 03/07/2016  . UTI (lower urinary tract infection)   . Near syncope 08/20/2015  . Morbid obesity (HCC) 08/20/2015  . Pain in the chest 07/06/2015  . Awareness alteration, transient 05/07/2015  . History of stroke 05/07/2015    Past Surgical History:  Procedure Laterality Date  . APPENDECTOMY  1946  . CATARACT EXTRACTION     bilateral  . GALLBLADDER SURGERY    . KNEE ARTHROPLASTY   2006   left   . KNEE ARTHROPLASTY  2006   right   . SHOULDER ARTHROSCOPY  2009   right  . TOTAL HIP ARTHROPLASTY  2002    left    OB History    No data available       Home Medications    Prior to Admission medications   Medication Sig Start Date End Date Taking? Authorizing Provider  allopurinol (ZYLOPRIM) 100 MG tablet Take 100 mg by mouth daily.     [provider]  calcium citrate-vitamin D (CITRACAL+D) 315-200 MG-UNIT per tablet Take 1 tablet by mouth daily.    [provider]  ciprofloxacin (CIPRO) 500 MG tablet Take 1 tablet (500 mg total) by mouth 2 (two) times daily. 03/09/16   Alison Murray, MD  furosemide (LASIX) 80 MG tablet Take 80 mg by mouth 2 (two) times daily.    [provider]  guaiFENesin (MUCINEX) 600 MG 12 hr tablet Take 1 tablet (600 mg total) by mouth 2 (two) times daily as needed for cough or to loosen phlegm. 03/09/16   Alison Murray, MD  levothyroxine (SYNTHROID, LEVOTHROID) 137 MCG tablet Take 137 mcg by mouth daily before breakfast.    [provider]  LORazepam (ATIVAN) 1 MG tablet Take 1 mg by mouth at bedtime.    [provider]  Misc Natural Products (GLUCOSAMINE CHONDROITIN VIT D3) CAPS Take 1 capsule by mouth daily.  [provider]  Multiple Vitamin (MULTIVITAMIN WITH MINERALS) TABS tablet Take 1 tablet by mouth daily.    [provider]  nitroGLYCERIN (NITROSTAT) 0.4 MG SL tablet Place 0.4 mg under the tongue every 5 (five) minutes as needed for chest pain.    [provider]  omeprazole (PRILOSEC) 40 MG capsule Take 40 mg by mouth daily.    [provider]  sertraline (ZOLOFT) 50 MG tablet Take 50 mg by mouth daily.    [provider]    Family History Family History  Problem Relation Age of Onset  . Diabetes Mother   . Stroke Mother   . Cancer Brother   . Kidney disease Sister     Social History Social History  Substance Use Topics  .  Smoking status: Former Games developermoker  . Smokeless tobacco: Never Used  . Alcohol use No     Allergies   Gabapentin; Lyrica [pregabalin]; Morphine and related; Lipitor [atorvastatin]; and Sulfa antibiotics   Review of Systems Review of Systems  Constitutional: Negative.   Musculoskeletal: Negative.   Skin: Positive for wound.       +area of redness, pain, itching and swelling to right fore arm  Allergic/Immunologic:       Tetanus immunization status uncertain  Neurological: Negative.   Psychiatric/Behavioral: Negative.      Physical Exam Updated Vital Signs BP (!) 141/54 (BP Location: Left Arm)   Pulse 70   Temp 97.9 F (36.6 C) (Oral)   Resp 18   Ht 5\' 4"  (1.626 m)   Wt 248 lb 8 oz (112.7 kg)   SpO2 92%   BMI 42.65 kg/m   Physical Exam  Constitutional: She appears well-developed and well-nourished.  HENT:  Head: Normocephalic and atraumatic.  Eyes: Conjunctivae and EOM are normal.  Neck: Neck supple. No tracheal deviation present. No thyromegaly present.  Cardiovascular: Normal rate.   No murmur heard. Pulmonary/Chest: Effort normal.  Abdominal:  Obese  Musculoskeletal: Normal range of motion. She exhibits no edema or tenderness.  Right upper extremity ulnar aspect of forearm with dime sized fluctuant area with surrounding 10 cm area of erythema and tenderness. No red streaks proximally full range of motion. Radial pulse 2+. No axillary nodes. All other extremity is a redness swelling or tenderness neurovascular intact  Neurological: She is alert. Coordination normal.  Skin: Skin is warm and dry. No rash noted.  Psychiatric: She has a normal mood and affect.  Nursing note and vitals reviewed.    ED Treatments / Results   DIAGNOSTIC STUDIES: Oxygen Saturation is 92% on RA, adequate by my interpretation.    COORDINATION OF CARE: 1:48 PM Discussed treatment plan with pt at bedside which includes I&D, antibiotics and pt agreed to plan.    Labs (all labs ordered  are listed, but only abnormal results are displayed) Labs Reviewed - No data to display  EKG  EKG Interpretation None       Radiology No results found.  Procedures .Marland Kitchen.Incision and Drainage Date/Time: 04/16/2017 2:25 PM Performed by: Doug SouJACUBOWITZ, Braedan Meuth Authorized by: Doug SouJACUBOWITZ, Deisha Stull   Consent:    Consent obtained:  Verbal   Consent given by:  Patient   Risks discussed:  Bleeding, incomplete drainage and pain   Alternatives discussed:  No treatment and observation Location:    Type:  Abscess   Size:  1 cm   Location:  Upper extremity   Upper extremity location:  Arm   Arm location:  R lower arm Pre-procedure details:  Skin preparation:  Betadine Anesthesia (see MAR for exact dosages):    Anesthesia method:  Local infiltration   Local anesthetic:  Lidocaine 2% WITH epi Procedure type:    Complexity:  Complex Procedure details:    Needle aspiration: no     Incision types:  Single straight   Incision depth:  Subcutaneous   Scalpel blade:  11   Wound management:  Probed and deloculated   Drainage:  Bloody and purulent   Drainage amount:  Moderate   Wound treatment:  Wound left open   Packing materials:  None Post-procedure details:    Patient tolerance of procedure:  Tolerated well, no immediate complications   (including critical care time)  Medications Ordered in ED  Tdap Updated  Initial Impression / Assessment and Plan / ED Course  I have reviewed the triage vital signs and the nursing notes.  Pertinent labs & imaging results that were available during my care of the patient were reviewed by me and considered in my medical decision making (see chart for details).     Plan prescription doxycycline. Warm soaks. Wound recheck 2 days  Final Clinical Impressions(s) / ED Diagnoses   Final diagnoses:  None  dxcutaneous abscess of right forearm with surrounding cellulitis  New Prescriptions New Prescriptions   No medications on file         Doug Sou, MD 04/16/17 1427

## 2017-04-16 NOTE — ED Triage Notes (Signed)
Patient c/o possible insect bite to right forearm. Per patient area appeared to right forearm last Sunday after having a party outside. Pustule noted with redness (streaking), hot and tender to touch. Denies any fevers or drainage.

## 2018-05-21 IMAGING — CR DG CHEST 2V
2 series · 2 of 2 positions shown · non-contrast
Comparison: 03/06/2016 .

CLINICAL DATA: Shortness of breath.  Productive cough .

EXAM:
CHEST  2 VIEW

[view not recorded (1 of 2)]
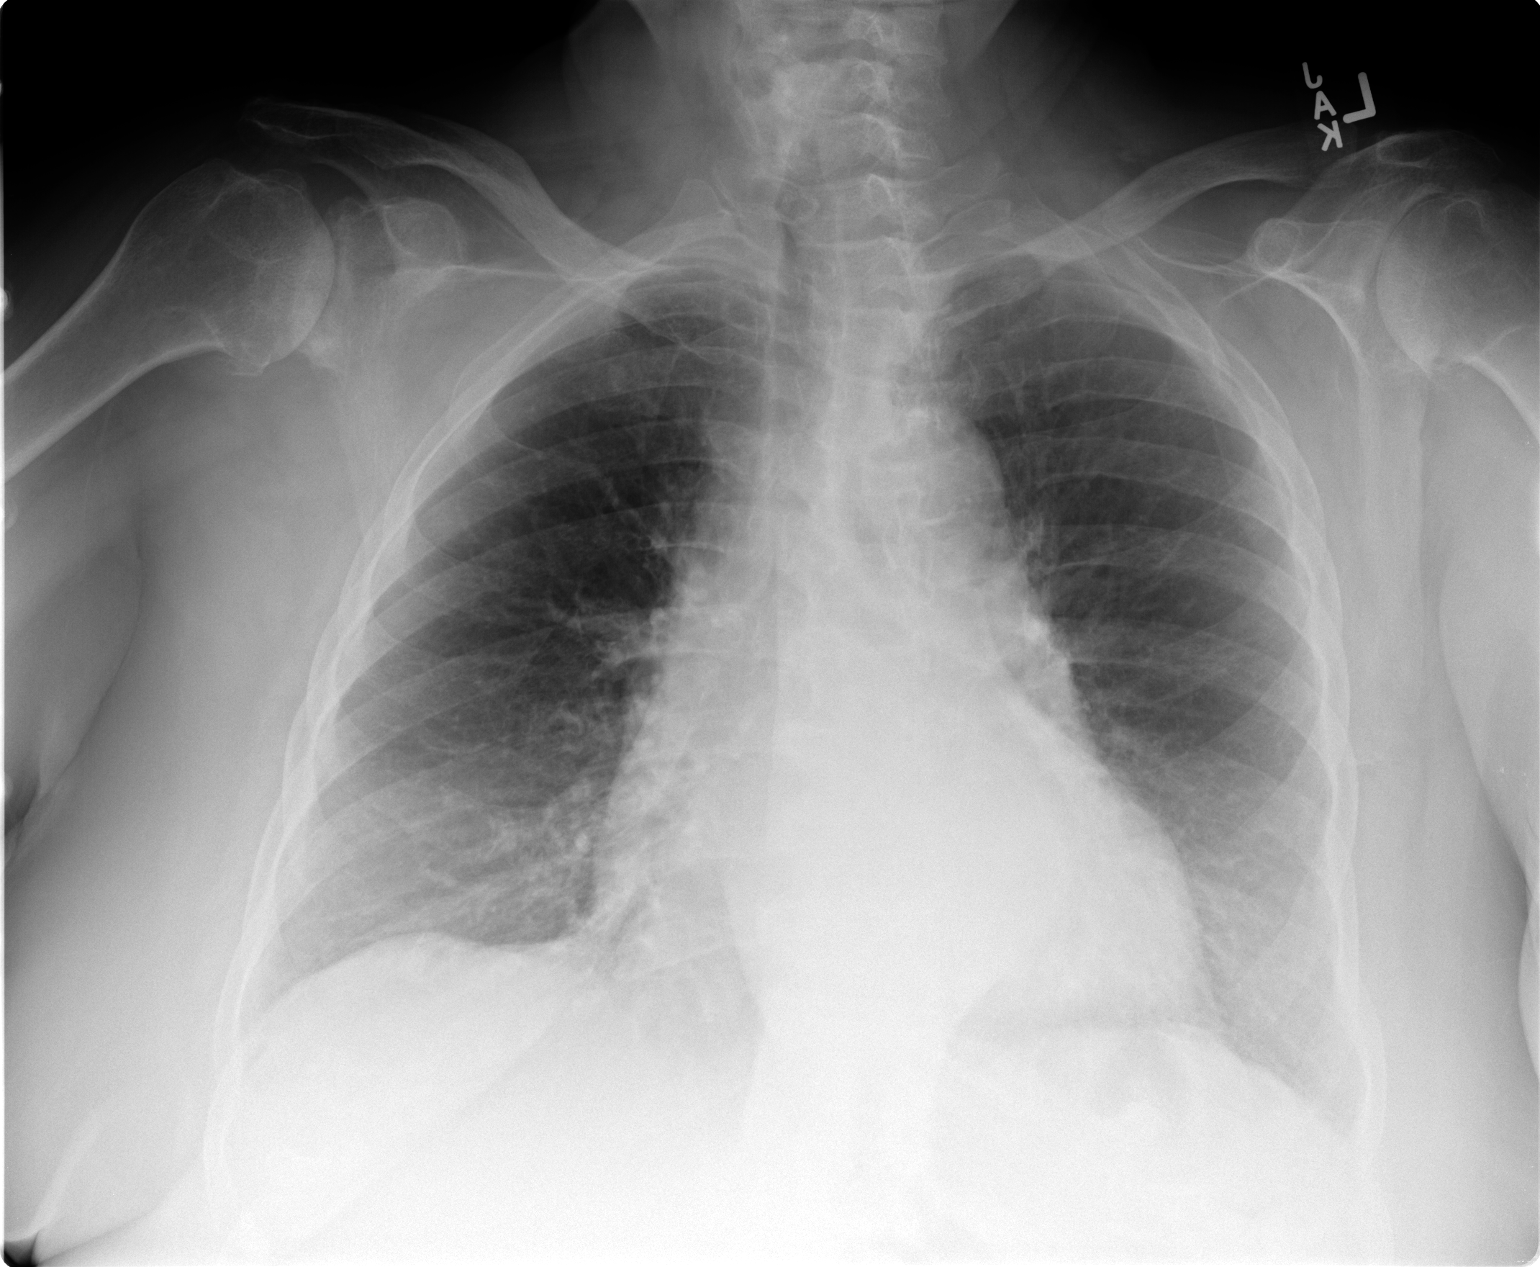

[view not recorded (2 of 2)]
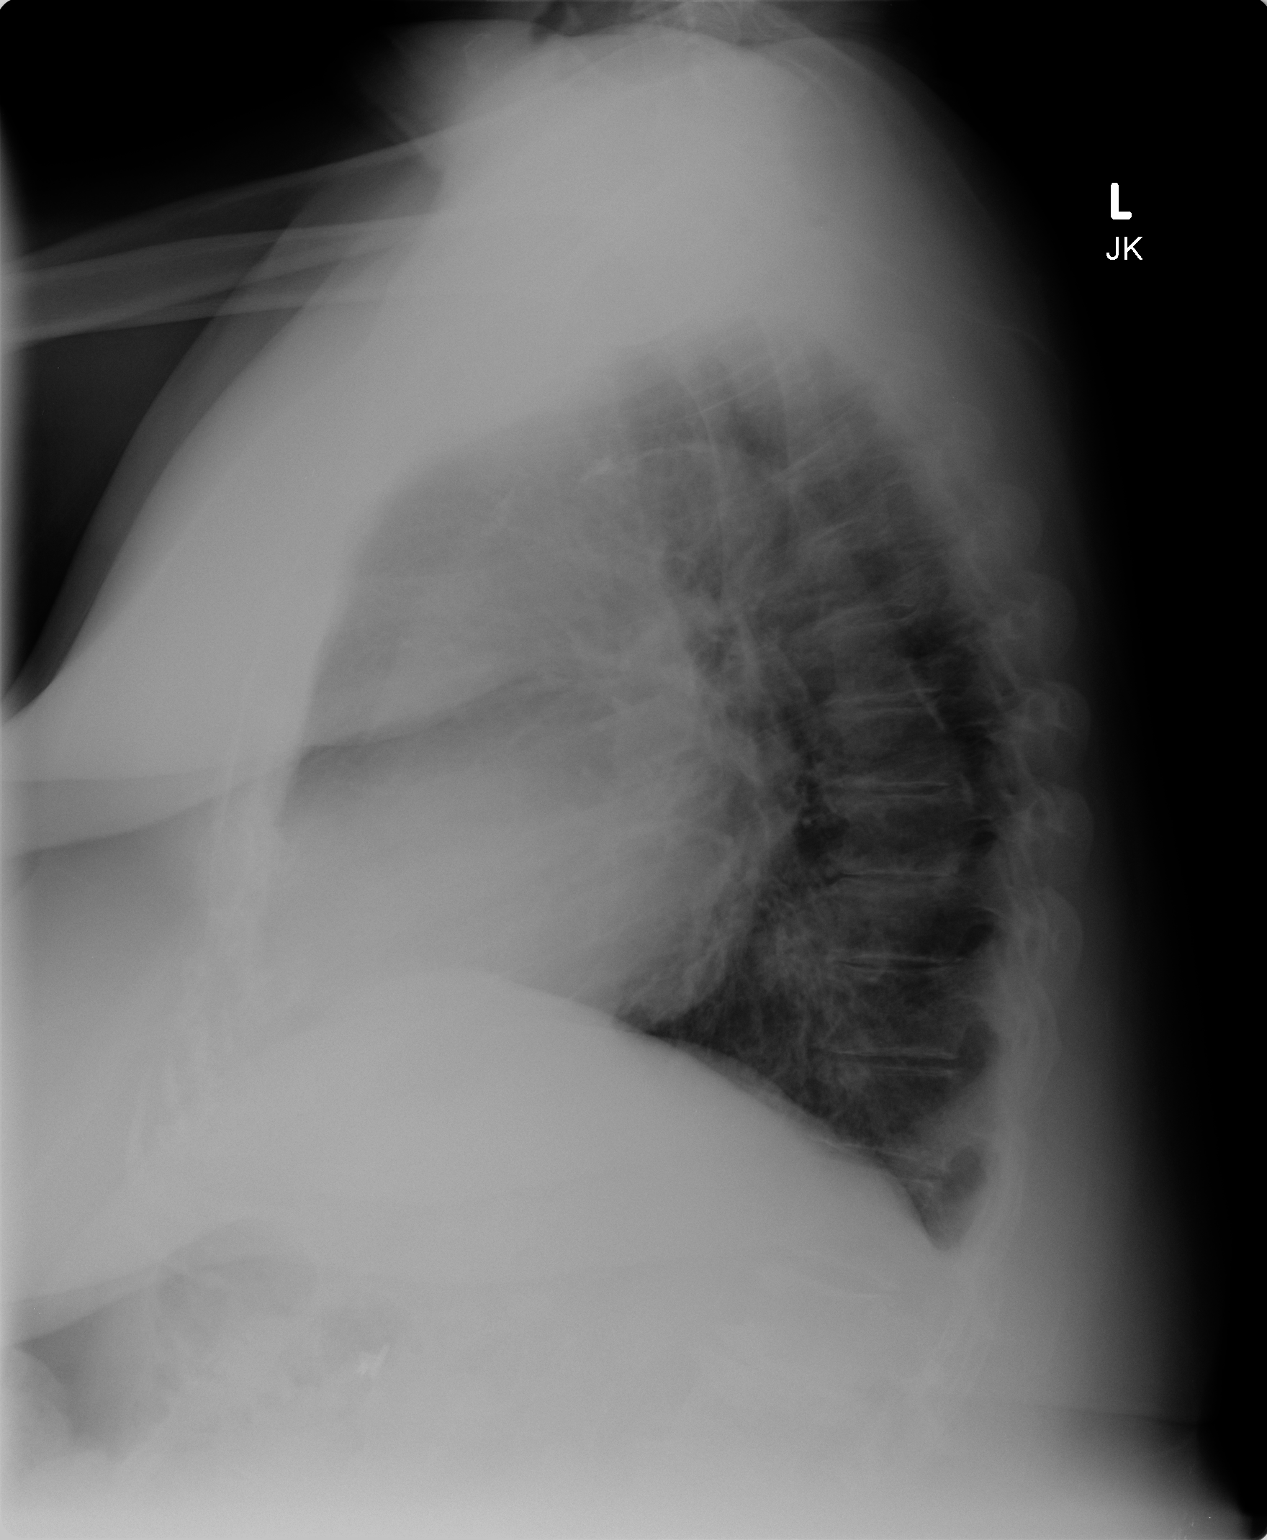

[2 of 2 positions shown; findings below may reference images not displayed]

FINDINGS: Mild cardiomegaly with normal pulmonary vascularity. Mild bibasilar
subsegmental atelectasis and/or infiltrates cannot be excluded.
Small left pleural effusion. No pneumothorax. Degenerative changes
both shoulders .
IMPRESSION: 1. Low lung volumes with mild bibasilar atelectasis and/or
infiltrates.

2. Stable cardiomegaly.  No pulmonary venous congestion .

## 2018-06-26 ENCOUNTER — Inpatient Hospital Stay (HOSPITAL_COMMUNITY)
Admission: EM | Admit: 2018-06-26 | Discharge: 2018-07-11 | DRG: 853 | Disposition: A | Discharge status: Rehabilitation Facility | Payer: Medicare Other | Attending: Internal Medicine | Admitting: Internal Medicine

## 2018-06-26 ENCOUNTER — Other Ambulatory Visit: Payer: Self-pay

## 2018-06-26 DIAGNOSIS — R739 Hyperglycemia, unspecified: Secondary | ICD-10-CM | POA: Diagnosis not present

## 2018-06-26 DIAGNOSIS — N390 Urinary tract infection, site not specified: Secondary | ICD-10-CM | POA: Diagnosis present

## 2018-06-26 DIAGNOSIS — K921 Melena: Secondary | ICD-10-CM | POA: Diagnosis not present

## 2018-06-26 DIAGNOSIS — B961 Klebsiella pneumoniae [K. pneumoniae] as the cause of diseases classified elsewhere: Secondary | ICD-10-CM | POA: Diagnosis present

## 2018-06-26 DIAGNOSIS — I9589 Other hypotension: Secondary | ICD-10-CM | POA: Diagnosis not present

## 2018-06-26 DIAGNOSIS — Z6838 Body mass index (BMI) 38.0-38.9, adult: Secondary | ICD-10-CM

## 2018-06-26 DIAGNOSIS — R159 Full incontinence of feces: Secondary | ICD-10-CM | POA: Diagnosis present

## 2018-06-26 DIAGNOSIS — R0682 Tachypnea, not elsewhere classified: Secondary | ICD-10-CM

## 2018-06-26 DIAGNOSIS — F419 Anxiety disorder, unspecified: Secondary | ICD-10-CM | POA: Diagnosis present

## 2018-06-26 DIAGNOSIS — Z79890 Hormone replacement therapy (postmenopausal): Secondary | ICD-10-CM | POA: Diagnosis not present

## 2018-06-26 DIAGNOSIS — L89159 Pressure ulcer of sacral region, unspecified stage: Secondary | ICD-10-CM | POA: Diagnosis not present

## 2018-06-26 DIAGNOSIS — I1310 Hypertensive heart and chronic kidney disease without heart failure, with stage 1 through stage 4 chronic kidney disease, or unspecified chronic kidney disease: Secondary | ICD-10-CM | POA: Diagnosis present

## 2018-06-26 DIAGNOSIS — J9690 Respiratory failure, unspecified, unspecified whether with hypoxia or hypercapnia: Secondary | ICD-10-CM

## 2018-06-26 DIAGNOSIS — L0231 Cutaneous abscess of buttock: Secondary | ICD-10-CM

## 2018-06-26 DIAGNOSIS — E872 Acidosis: Secondary | ICD-10-CM | POA: Diagnosis present

## 2018-06-26 DIAGNOSIS — R05 Cough: Secondary | ICD-10-CM

## 2018-06-26 DIAGNOSIS — R5381 Other malaise: Secondary | ICD-10-CM

## 2018-06-26 DIAGNOSIS — K529 Noninfective gastroenteritis and colitis, unspecified: Secondary | ICD-10-CM | POA: Diagnosis present

## 2018-06-26 DIAGNOSIS — Z66 Do not resuscitate: Secondary | ICD-10-CM | POA: Diagnosis present

## 2018-06-26 DIAGNOSIS — D72829 Elevated white blood cell count, unspecified: Secondary | ICD-10-CM

## 2018-06-26 DIAGNOSIS — T380X5A Adverse effect of glucocorticoids and synthetic analogues, initial encounter: Secondary | ICD-10-CM | POA: Diagnosis not present

## 2018-06-26 DIAGNOSIS — L98429 Non-pressure chronic ulcer of back with unspecified severity: Secondary | ICD-10-CM | POA: Diagnosis not present

## 2018-06-26 DIAGNOSIS — M10042 Idiopathic gout, left hand: Secondary | ICD-10-CM | POA: Diagnosis not present

## 2018-06-26 DIAGNOSIS — G8918 Other acute postprocedural pain: Secondary | ICD-10-CM | POA: Diagnosis not present

## 2018-06-26 DIAGNOSIS — I5189 Other ill-defined heart diseases: Secondary | ICD-10-CM

## 2018-06-26 DIAGNOSIS — K922 Gastrointestinal hemorrhage, unspecified: Secondary | ICD-10-CM | POA: Diagnosis not present

## 2018-06-26 DIAGNOSIS — Z96653 Presence of artificial knee joint, bilateral: Secondary | ICD-10-CM | POA: Diagnosis present

## 2018-06-26 DIAGNOSIS — D631 Anemia in chronic kidney disease: Secondary | ICD-10-CM | POA: Diagnosis present

## 2018-06-26 DIAGNOSIS — I361 Nonrheumatic tricuspid (valve) insufficiency: Secondary | ICD-10-CM | POA: Diagnosis not present

## 2018-06-26 DIAGNOSIS — T148XXA Other injury of unspecified body region, initial encounter: Secondary | ICD-10-CM

## 2018-06-26 DIAGNOSIS — I5032 Chronic diastolic (congestive) heart failure: Secondary | ICD-10-CM | POA: Diagnosis present

## 2018-06-26 DIAGNOSIS — D649 Anemia, unspecified: Secondary | ICD-10-CM | POA: Diagnosis not present

## 2018-06-26 DIAGNOSIS — N183 Chronic kidney disease, stage 3 (moderate): Secondary | ICD-10-CM | POA: Diagnosis present

## 2018-06-26 DIAGNOSIS — Z882 Allergy status to sulfonamides status: Secondary | ICD-10-CM

## 2018-06-26 DIAGNOSIS — I959 Hypotension, unspecified: Secondary | ICD-10-CM | POA: Diagnosis not present

## 2018-06-26 DIAGNOSIS — G4733 Obstructive sleep apnea (adult) (pediatric): Secondary | ICD-10-CM | POA: Diagnosis present

## 2018-06-26 DIAGNOSIS — R6521 Severe sepsis with septic shock: Secondary | ICD-10-CM | POA: Diagnosis not present

## 2018-06-26 DIAGNOSIS — D62 Acute posthemorrhagic anemia: Secondary | ICD-10-CM | POA: Diagnosis not present

## 2018-06-26 DIAGNOSIS — E861 Hypovolemia: Secondary | ICD-10-CM | POA: Diagnosis not present

## 2018-06-26 DIAGNOSIS — Z96642 Presence of left artificial hip joint: Secondary | ICD-10-CM | POA: Diagnosis present

## 2018-06-26 DIAGNOSIS — Z885 Allergy status to narcotic agent status: Secondary | ICD-10-CM

## 2018-06-26 DIAGNOSIS — N179 Acute kidney failure, unspecified: Secondary | ICD-10-CM | POA: Diagnosis present

## 2018-06-26 DIAGNOSIS — M109 Gout, unspecified: Secondary | ICD-10-CM | POA: Diagnosis not present

## 2018-06-26 DIAGNOSIS — A4150 Gram-negative sepsis, unspecified: Secondary | ICD-10-CM | POA: Diagnosis present

## 2018-06-26 DIAGNOSIS — R32 Unspecified urinary incontinence: Secondary | ICD-10-CM | POA: Diagnosis present

## 2018-06-26 DIAGNOSIS — N3289 Other specified disorders of bladder: Secondary | ICD-10-CM | POA: Diagnosis not present

## 2018-06-26 DIAGNOSIS — K5909 Other constipation: Secondary | ICD-10-CM | POA: Diagnosis present

## 2018-06-26 DIAGNOSIS — L03317 Cellulitis of buttock: Secondary | ICD-10-CM | POA: Diagnosis present

## 2018-06-26 DIAGNOSIS — E039 Hypothyroidism, unspecified: Secondary | ICD-10-CM | POA: Diagnosis present

## 2018-06-26 DIAGNOSIS — M726 Necrotizing fasciitis: Secondary | ICD-10-CM | POA: Diagnosis present

## 2018-06-26 DIAGNOSIS — L8990 Pressure ulcer of unspecified site, unspecified stage: Secondary | ICD-10-CM

## 2018-06-26 DIAGNOSIS — K219 Gastro-esophageal reflux disease without esophagitis: Secondary | ICD-10-CM | POA: Diagnosis present

## 2018-06-26 DIAGNOSIS — F329 Major depressive disorder, single episode, unspecified: Secondary | ICD-10-CM | POA: Diagnosis present

## 2018-06-26 DIAGNOSIS — L89150 Pressure ulcer of sacral region, unstageable: Secondary | ICD-10-CM | POA: Diagnosis present

## 2018-06-26 DIAGNOSIS — T1490XA Injury, unspecified, initial encounter: Secondary | ICD-10-CM

## 2018-06-26 DIAGNOSIS — Z87891 Personal history of nicotine dependence: Secondary | ICD-10-CM

## 2018-06-26 DIAGNOSIS — R531 Weakness: Secondary | ICD-10-CM | POA: Diagnosis present

## 2018-06-26 DIAGNOSIS — Z888 Allergy status to other drugs, medicaments and biological substances status: Secondary | ICD-10-CM

## 2018-06-26 DIAGNOSIS — B9689 Other specified bacterial agents as the cause of diseases classified elsewhere: Secondary | ICD-10-CM | POA: Diagnosis present

## 2018-06-26 DIAGNOSIS — A419 Sepsis, unspecified organism: Secondary | ICD-10-CM | POA: Diagnosis not present

## 2018-06-26 DIAGNOSIS — E038 Other specified hypothyroidism: Secondary | ICD-10-CM | POA: Diagnosis not present

## 2018-06-26 DIAGNOSIS — I5033 Acute on chronic diastolic (congestive) heart failure: Secondary | ICD-10-CM | POA: Diagnosis not present

## 2018-06-26 DIAGNOSIS — T465X5A Adverse effect of other antihypertensive drugs, initial encounter: Secondary | ICD-10-CM | POA: Diagnosis present

## 2018-06-26 DIAGNOSIS — M1812 Unilateral primary osteoarthritis of first carpometacarpal joint, left hand: Secondary | ICD-10-CM | POA: Diagnosis not present

## 2018-06-26 DIAGNOSIS — I351 Nonrheumatic aortic (valve) insufficiency: Secondary | ICD-10-CM | POA: Diagnosis not present

## 2018-06-26 DIAGNOSIS — E860 Dehydration: Secondary | ICD-10-CM | POA: Diagnosis present

## 2018-06-26 DIAGNOSIS — R059 Cough, unspecified: Secondary | ICD-10-CM

## 2018-06-26 LAB — CBC
HCT: 32.6 % — ABNORMAL LOW (ref 36.0–46.0)
HEMOGLOBIN: 10.7 g/dL — AB (ref 12.0–15.0)
MCH: 30.7 pg (ref 26.0–34.0)
MCHC: 32.8 g/dL (ref 30.0–36.0)
MCV: 93.4 fL (ref 78.0–100.0)
Platelets: 143 10*3/uL — ABNORMAL LOW (ref 150–400)
RBC: 3.49 MIL/uL — AB (ref 3.87–5.11)
RDW: 14.5 % (ref 11.5–15.5)
WBC: 16.1 10*3/uL — ABNORMAL HIGH (ref 4.0–10.5)

## 2018-06-26 LAB — I-STAT CG4 LACTIC ACID, ED
LACTIC ACID, VENOUS: 2.96 mmol/L — AB (ref 0.5–1.9)
Lactic Acid, Venous: 1.87 mmol/L (ref 0.5–1.9)

## 2018-06-26 LAB — COMPREHENSIVE METABOLIC PANEL
ALBUMIN: 2.7 g/dL — AB (ref 3.5–5.0)
ALK PHOS: 131 U/L — AB (ref 38–126)
ALT: 13 U/L (ref 0–44)
ANION GAP: 19 — AB (ref 5–15)
AST: 22 U/L (ref 15–41)
BUN: 100 mg/dL — ABNORMAL HIGH (ref 8–23)
CHLORIDE: 99 mmol/L (ref 98–111)
CO2: 18 mmol/L — ABNORMAL LOW (ref 22–32)
CREATININE: 5.59 mg/dL — AB (ref 0.44–1.00)
Calcium: 8.5 mg/dL — ABNORMAL LOW (ref 8.9–10.3)
GFR calc non Af Amer: 6 mL/min — ABNORMAL LOW (ref >60)
GFR, EST AFRICAN AMERICAN: 7 mL/min — AB (ref >60)
GLUCOSE: 112 mg/dL — AB (ref 70–99)
Potassium: 5.1 mmol/L (ref 3.5–5.1)
SODIUM: 136 mmol/L (ref 135–145)
Total Bilirubin: 1.6 mg/dL — ABNORMAL HIGH (ref 0.3–1.2)
Total Protein: 6.1 g/dL — ABNORMAL LOW (ref 6.5–8.1)

## 2018-06-26 LAB — I-STAT TROPONIN, ED: TROPONIN I, POC: 0.04 ng/mL (ref 0.00–0.08)

## 2018-06-26 LAB — TYPE AND SCREEN
ABO/RH(D): B POS
Antibody Screen: NEGATIVE

## 2018-06-26 LAB — PROTIME-INR
INR: 1.24
PROTHROMBIN TIME: 15.5 s — AB (ref 11.4–15.2)

## 2018-06-26 LAB — POC OCCULT BLOOD, ED: FECAL OCCULT BLD: POSITIVE — AB

## 2018-06-26 LAB — ABO/RH: ABO/RH(D): B POS

## 2018-06-26 MED ORDER — FAMOTIDINE IN NACL 20-0.9 MG/50ML-% IV SOLN
20.0000 mg | Freq: Two times a day (BID) | INTRAVENOUS | Status: DC
  Administered 2018-06-26: 20 mg via INTRAVENOUS
  Filled 2018-06-26: qty 50

## 2018-06-26 MED ORDER — SODIUM CHLORIDE 0.9 % IV SOLN: INTRAVENOUS | Status: DC

## 2018-06-26 MED ORDER — ACETAMINOPHEN 325 MG PO TABS: 650.0000 mg | ORAL_TABLET | Freq: Four times a day (QID) | ORAL | Status: DC | PRN

## 2018-06-26 MED ORDER — ACETAMINOPHEN 650 MG RE SUPP: 650.0000 mg | Freq: Four times a day (QID) | RECTAL | Status: DC | PRN

## 2018-06-26 MED ORDER — SODIUM CHLORIDE 0.9 % IV BOLUS
1000.0000 mL | Freq: Once | INTRAVENOUS | Status: AC
  Administered 2018-06-26: 1000 mL via INTRAVENOUS

## 2018-06-26 MED ORDER — SODIUM CHLORIDE 0.9 % IV SOLN
8.0000 mg/h | INTRAVENOUS | Status: DC
  Administered 2018-06-27: 8 mg/h via INTRAVENOUS
  Filled 2018-06-26 (×3): qty 80

## 2018-06-26 MED ORDER — ONDANSETRON HCL 4 MG/2ML IJ SOLN
4.0000 mg | Freq: Once | INTRAMUSCULAR | Status: AC
  Administered 2018-06-26: 4 mg via INTRAVENOUS
  Filled 2018-06-26: qty 2

## 2018-06-26 MED ORDER — SODIUM CHLORIDE 0.9 % IV SOLN
80.0000 mg | Freq: Once | INTRAVENOUS | Status: AC
  Administered 2018-06-27: 80 mg via INTRAVENOUS
  Filled 2018-06-26: qty 80

## 2018-06-26 NOTE — ED Notes (Signed)
ED Provider at bedside. 

## 2018-06-26 NOTE — ED Provider Notes (Signed)
[facility] EMERGENCY DEPARTMENT Provider Note   CSN: [id number] Arrival date & time: 06/26/18  1958     History   Chief Complaint Chief Complaint  Patient presents with  . Weakness    HPI [name] is a 84 y.o. female.  States she has not been feeling well since Sunday when she has been nauseous and had no appetite.  She is complained of some diffuse mid abdominal pain.  Starting about 1 PM today she had multiple loose dark stools.  She went to her physician's office where they found her to be hypotensive and transferred here for further evaluation.  She states she had one prior episode of GI bleed a few years ago but that was red blood and she was told it was likely due to hemorrhoids.  She denies any chest pain shortness of breath or syncope.  She is complaining of some pain on her buttocks from some sores there.  The history is provided by the patient.  Abdominal Pain   This is a new problem. Episode onset: 3 days. The problem occurs constantly. The problem has not changed since onset.The pain is associated with an unknown factor. The pain is located in the epigastric region. The pain is moderate. Associated symptoms include anorexia, diarrhea and nausea. Pertinent negatives include fever, vomiting, dysuria and frequency.    Past Medical History:  Diagnosis Date  . Chest pain   . Depression   . GERD (gastroesophageal reflux disease)   . Hypertension   . Sleep apnea   . Thyroid disease     Patient Active Problem List   Diagnosis Date Noted  . Bacteremia 03/07/2016  . Atypical chest pain 03/07/2016  . Acute-on-chronic kidney injury (HCC) 03/07/2016  . Essential hypertension 03/07/2016  . Chronic diastolic congestive heart failure (HCC) 03/07/2016  . Hypothyroidism 03/07/2016  . Depression with anxiety 03/07/2016  . UTI (lower urinary tract infection)   . Near syncope 08/20/2015  . Morbid obesity (HCC) 08/20/2015  . Pain in the chest 07/06/2015    . Awareness alteration, transient 05/07/2015  . History of stroke 05/07/2015    Past Surgical History:  Procedure Laterality Date  . APPENDECTOMY  1946  . CATARACT EXTRACTION     bilateral  . GALLBLADDER SURGERY    . KNEE ARTHROPLASTY  2006   left   . KNEE ARTHROPLASTY  2006   right   . SHOULDER ARTHROSCOPY  2009   right  . TOTAL HIP ARTHROPLASTY  2002    left     OB History   None      Home Medications    Prior to Admission medications   Medication Sig Start Date End Date Taking? Authorizing Provider  allopurinol (ZYLOPRIM) 100 MG tablet Take 100 mg by mouth daily.     [provider]  calcium citrate-vitamin D (CITRACAL+D) 315-200 MG-UNIT per tablet Take 1 tablet by mouth daily.    [provider]  ciprofloxacin (CIPRO) 500 MG tablet Take 1 tablet (500 mg total) by mouth 2 (two) times daily. 03/09/16   Alison Murray, MD  doxycycline (VIBRAMYCIN) 100 MG capsule Take 1 capsule (100 mg total) by mouth 2 (two) times daily. One po bid x 7 days 04/16/17   Doug Sou, MD  furosemide (LASIX) 80 MG tablet Take 80 mg by mouth 2 (two) times daily.    [provider]  guaiFENesin (MUCINEX) 600 MG 12 hr tablet Take 1 tablet (600 mg total) by mouth  2 (two) times daily as needed for cough or to loosen phlegm. 03/09/16   Alison Murrayevine, Alma M, MD  levothyroxine (SYNTHROID, LEVOTHROID) 137 MCG tablet Take 137 mcg by mouth daily before breakfast.    [provider]  LORazepam (ATIVAN) 1 MG tablet Take 1 mg by mouth at bedtime.    [provider]  Misc Natural Products (GLUCOSAMINE CHONDROITIN VIT D3) CAPS Take 1 capsule by mouth daily.     [provider]  Multiple Vitamin (MULTIVITAMIN WITH MINERALS) TABS tablet Take 1 tablet by mouth daily.    [provider]  nitroGLYCERIN (NITROSTAT) 0.4 MG SL tablet Place 0.4 mg under the tongue every 5 (five) minutes as needed for chest pain.    [provider]  omeprazole (PRILOSEC)  40 MG capsule Take 40 mg by mouth daily.    [provider]  sertraline (ZOLOFT) 50 MG tablet Take 50 mg by mouth daily.    [provider]    Family History Family History  Problem Relation Age of Onset  . Diabetes Mother   . Stroke Mother   . Cancer Brother   . Kidney disease Sister     Social History Social History   Tobacco Use  . Smoking status: Former Games developermoker  . Smokeless tobacco: Never Used  Substance Use Topics  . Alcohol use: No    Alcohol/week: 0.0 oz  . Drug use: No     Allergies   Gabapentin; Lyrica [pregabalin]; Morphine and related; Lipitor [atorvastatin]; and Sulfa antibiotics   Review of Systems Review of Systems  Constitutional: Positive for activity change, appetite change and fatigue. Negative for chills and fever.  HENT: Negative for rhinorrhea and sore throat.   Eyes: Negative for visual disturbance.  Respiratory: Negative for cough and shortness of breath.   Cardiovascular: Negative for chest pain.  Gastrointestinal: Positive for abdominal pain, anorexia, diarrhea and nausea. Negative for vomiting.  Genitourinary: Negative for dysuria and frequency.  Musculoskeletal: Negative for back pain and neck pain.  Skin: Positive for wound.  Neurological: Positive for dizziness and light-headedness.     Physical Exam Updated Vital Signs BP (!) 132/116   Pulse (!) 30   Temp 97.9 F (36.6 C) (Oral)   Resp 19   Ht 5\' 4"  (1.626 m)   Wt 98.9 kg (218 lb)   SpO2 100%   BMI 37.42 kg/m   Physical Exam  Constitutional: She is oriented to person, place, and time. She appears well-developed and well-nourished. No distress.  HENT:  Head: Normocephalic and atraumatic.  Right Ear: External ear normal.  Left Ear: External ear normal.  Nose: Nose normal.  Mouth/Throat: Oropharynx is clear and moist.  Eyes: Pupils are equal, round, and reactive to light. EOM are normal.  Neck: Normal range of motion. Neck supple.  Cardiovascular: Normal  rate, regular rhythm, normal heart sounds and intact distal pulses.  Pulmonary/Chest: Effort normal. No stridor. She has no wheezes. She has no rales.  Abdominal: Soft. She exhibits no mass. There is tenderness (mild diffuse). There is no rebound and no guarding.  Musculoskeletal: Normal range of motion. She exhibits no deformity.  Neurological: She is alert and oriented to person, place, and time. She has normal strength. No sensory deficit. GCS eye subscore is 4. GCS verbal subscore is 5. GCS motor subscore is 6.  Skin: Skin is warm and dry. Capillary refill takes less than 2 seconds. Rash noted.  She is induration and erythema of her left buttock and around the  anus with few blackened areas.  The does not appear to be any obvious fluctuance.  There is a pinhole opening with no drainage that possibly is a fistula tract.  Nursing note and vitals reviewed.    ED Treatments / Results  Labs (all labs ordered are listed, but only abnormal results are displayed) Labs Reviewed  COMPREHENSIVE METABOLIC PANEL - Abnormal; Notable for the following components:      Result Value   CO2 18 (*)    Glucose, Bld 112 (*)    BUN 100 (*)    Creatinine, Ser 5.59 (*)    Calcium 8.5 (*)    Total Protein 6.1 (*)    Albumin 2.7 (*)    Alkaline Phosphatase 131 (*)    Total Bilirubin 1.6 (*)    GFR calc non Af Amer 6 (*)    GFR calc Af Amer 7 (*)    Anion gap 19 (*)    All other components within normal limits  CBC - Abnormal; Notable for the following components:   WBC 16.1 (*)    RBC 3.49 (*)    Hemoglobin 10.7 (*)    HCT 32.6 (*)    Platelets 143 (*)    All other components within normal limits  PROTIME-INR - Abnormal; Notable for the following components:   Prothrombin Time 15.5 (*)    All other components within normal limits  POC OCCULT BLOOD, ED - Abnormal; Notable for the following components:   Fecal Occult Bld POSITIVE (*)    All other components within normal limits  I-STAT CG4 LACTIC  ACID, ED - Abnormal; Notable for the following components:   Lactic Acid, Venous 2.96 (*)    All other components within normal limits  I-STAT TROPONIN, ED  I-STAT CG4 LACTIC ACID, ED  TYPE AND SCREEN  ABO/RH    EKG EKG Interpretation  Date/Time:  Tuesday June 26 2018 21:28:10 EDT Ventricular Rate:  88 PR Interval:  158 QRS Duration: 113 QT Interval:  459 QTC Calculation: 506 R Axis:   -6 Text Interpretation:  Sinus rhythm Paired ventricular premature complexes Borderline intraventricular conduction delay Low voltage, extremity and precordial leads Prolonged QT interval similar to earlier today Confirmed by Meridee Score [phone]) on 06/26/2018 9:38:13 PM   Radiology No results found.  Procedures .Critical Care Performed by: Terrilee Files, MD Authorized by: Terrilee Files, MD   Critical care provider statement:    Critical care time (minutes):  60   Critical care time was exclusive of:  Separately billable procedures and treating other patients   Critical care was necessary to treat or prevent imminent or life-threatening deterioration of the following conditions:  Circulatory failure, metabolic crisis and dehydration   Critical care was time spent personally by me on the following activities:  Development of treatment plan with patient or surrogate, discussions with consultants, discussions with primary provider, evaluation of patient's response to treatment, examination of patient, obtaining history from patient or surrogate, ordering and performing treatments and interventions, ordering and review of laboratory studies, ordering and review of radiographic studies, pulse oximetry, re-evaluation of patient's condition and review of old charts   I assumed direction of critical care for this patient from another provider in my specialty: no     (including critical care time)  Medications Ordered in ED Medications  famotidine (PEPCID) IVPB 20 mg premix (0 mg Intravenous  Stopped 06/26/18 2225)  sodium chloride 0.9 % bolus 1,000 mL (0 mLs Intravenous Stopped 06/26/18 2227)  ondansetron [facility]) injection 4 mg (4 mg Intravenous Given 06/26/18 2134)  sodium chloride 0.9 % bolus 1,000 mL (1,000 mLs Intravenous New Bag/Given 06/26/18 2239)     Initial Impression / Assessment and Plan / ED Course  I have reviewed the triage vital signs and the nursing notes.  Pertinent labs & imaging results that were available during my care of the patient were reviewed by me and considered in my medical decision making (see chart for details).  Clinical Course as of Jun 26 2358  Tue Jun 26, 2018  2317 Discussed with Dr. Selena Batten from the medicine team who will evaluate the patient in the ER.   [MB]  2355 Discussed with Dr. Randa Evens from GI who will have the patient evaluated tomorrow by the team.   [MB]  2357 Discussed with Dr. Larina Bras from nephrology who will have somebody from the team see her tomorrow.   [MB]    Clinical Course User Index [MB] Terrilee Files, MD   Patient is here with weakness nausea diarrhea and possible GI bleed along with what appears to be an infection around her buttocks.  She is been persistently hypotensive in the emergency department.  Her labs are concerning for a decreased hemoglobin from her prior although not a critical nature.  She is also in acute renal failure which is increased from her baseline of 1.8.  Her lactate is elevated.  I think there may be some element of GI bleed but I think the patient is most likely profoundly dehydrated secondary to poor intake along with her diuretics and her blood pressure medicine.  Her blood pressure has been responsive to IV fluids and we will continue with that.  Dr. Selena Batten from [facility] hospital[facility] has evaluated the patient and is added on some further imaging and antibiotics.  There is clearly some concern that she may drop her blood pressure and may be more adequately treated in the ICU.  Of left that to Dr. Selena Batten to  evaluate the patient and make a decision regarding her disposition.  Patient has been updated on plans along with her family and questions were answered to the best of our ability.  Final Clinical Impressions(s) / ED Diagnoses   Final diagnoses:  Acute renal failure, unspecified acute renal failure type (HCC)  Gastrointestinal hemorrhage, unspecified gastrointestinal hemorrhage type  Hypotension, unspecified hypotension type  Cellulitis and abscess of buttock    ED Discharge Orders    None       Terrilee Files, MD 06/27/18 1202

## 2018-06-26 NOTE — H&P (Addendum)
TRH H&P   Patient Demographics:    [name], is a 84 y.o. female  MRN: [id number]   DOB - [birth year]  Admit Date - 06/26/2018  Outpatient Primary MD for the patient is Lavone Orn, MD  Referring MD/NP/PA:   Dr. Melina Copa  Outpatient Specialists:    Patient coming from: home  Chief Complaint  Patient presents with  . Weakness      HPI:    [name]  is a 84 y.o. female, w Hypothyroidism, , OSA on cpap, Jerrye Bushy who presents with c/o diarrhea on Monday.  Pt c/o abdominal pain as well. Mostly lower abdomen bilaterally.  Pt has had poor appetite since Monday and po intake has been minimal.  Pt notes slight cough, (dry), denies fever, chills, cp, palp, sob beyond her baseline, n/v, diarrhea, brbpr.  Pt does note black stool today. Does note taking peptobismol on Monday.   Pt is on lasix and losartan at home.  Denies NSAIDS or aspirin use.  Pt presented due to weakness.   In ED noted to have ARF, and heme positive stool    Wbc 16.1, Hgb 10.7, Plt 143,  Na 136, K 5.1,  Bun 100, Creatinie 5.59 (baseline 1.4) Alb 2.7 Ast 22, Alt 13, Alk phos 131, T. Bili 1.6 AG 19   Lactic acid 2.96  Pt will be admitted for ARF secondary to dehydration in setting of ARB use, Black stool r/o GI bleeding and hypotension and cellulits of the buttock    Review of systems:    In addition to the HPI above, No Fever-chills, No Headache, No changes with Vision or hearing, No problems swallowing food or Liquids, No Chest pain,    No blood in urine  No dysuria, No new skin rashes or bruises, No new joints pains-aches,  No new weakness, tingling, numbness in any extremity, No recent weight gain or loss, No polyuria, polydypsia or polyphagia, No significant Mental Stressors.  A full 10 point Review of Systems was done, except as stated above, all other Review of Systems were negative.   With  Past History of the following :    Past Medical History:  Diagnosis Date  . Chest pain   . Depression   . GERD (gastroesophageal reflux disease)   . Hypertension   . Sleep apnea   . Thyroid disease       Past Surgical History:  Procedure Laterality Date  . APPENDECTOMY  1946  . CATARACT EXTRACTION     bilateral  . GALLBLADDER SURGERY    . KNEE ARTHROPLASTY  2006   left   . KNEE ARTHROPLASTY  2006   right   . SHOULDER ARTHROSCOPY  2009   right  . TOTAL HIP ARTHROPLASTY  2002    left      Social History:     Social History   Tobacco Use  . Smoking  status: Former Research scientist (life sciences)  . Smokeless tobacco: Never Used  Substance Use Topics  . Alcohol use: No    Alcohol/week: 0.0 oz     Lives - at home  Mobility - walks by self   Family History :     Family History  Problem Relation Age of Onset  . Diabetes Mother   . Stroke Mother   . Cancer Brother   . Kidney disease Sister        Home Medications:   Prior to Admission medications   Medication Sig Start Date End Date Taking? Authorizing Provider  allopurinol (ZYLOPRIM) 100 MG tablet Take 100 mg by mouth daily.     [provider]  calcium citrate-vitamin D (CITRACAL+D) 315-200 MG-UNIT per tablet Take 1 tablet by mouth daily.    [provider]  ciprofloxacin (CIPRO) 500 MG tablet Take 1 tablet (500 mg total) by mouth 2 (two) times daily. Patient not taking: Reported on 06/26/2018 03/09/16   Robbie Lis, MD  doxycycline (VIBRAMYCIN) 100 MG capsule Take 1 capsule (100 mg total) by mouth 2 (two) times daily. One po bid x 7 days Patient not taking: Reported on 06/26/2018 04/16/17   Orlie Dakin, MD  furosemide (LASIX) 80 MG tablet Take 80 mg by mouth 2 (two) times daily.    [provider]  guaiFENesin (MUCINEX) 600 MG 12 hr tablet Take 1 tablet (600 mg total) by mouth 2 (two) times daily as needed for cough or to loosen phlegm. 03/09/16   Robbie Lis, MD  levothyroxine (SYNTHROID,  LEVOTHROID) 137 MCG tablet Take 137 mcg by mouth daily before breakfast.    [provider]  LORazepam (ATIVAN) 1 MG tablet Take 1 mg by mouth at bedtime.    [provider]  Misc Natural Products (GLUCOSAMINE CHONDROITIN VIT D3) CAPS Take 1 capsule by mouth daily.     [provider]  Multiple Vitamin (MULTIVITAMIN WITH MINERALS) TABS tablet Take 1 tablet by mouth daily.    [provider]  nitroGLYCERIN (NITROSTAT) 0.4 MG SL tablet Place 0.4 mg under the tongue every 5 (five) minutes as needed for chest pain.    [provider]  omeprazole (PRILOSEC) 40 MG capsule Take 40 mg by mouth daily.    [provider]  sertraline (ZOLOFT) 50 MG tablet Take 50 mg by mouth daily.    [provider]     Allergies:     Allergies  Allergen Reactions  . Gabapentin Other (See Comments)    Alters mood  . Lyrica [Pregabalin] Other (See Comments)    Lethargic, mood changes  . Morphine And Related Hives and Itching  . Lipitor [Atorvastatin] Itching and Rash  . Sulfa Antibiotics Rash     Physical Exam:   Vitals  Blood pressure (!) 88/46, pulse 82, temperature 97.9 F (36.6 C), temperature source Oral, resp. rate (!) 24, height 5' 4"  (1.626 m), weight 218 lb (98.9 kg), SpO2 96 %.   1. General   lying in bed in NAD,    2. Normal affect and insight, Not Suicidal or Homicidal, Awake Alert, Oriented X 3.  3. No F.N deficits, ALL C.Nerves Intact, Strength 5/5 all 4 extremities, Sensation intact all 4 extremities, Plantars down going.  4. Ears and Eyes appear Normal, Conjunctivae clear, PERRLA. Moist Oral Mucosa.  5. Supple Neck, No JVD, No cervical lymphadenopathy appriciated, No Carotid Bruits.  6. Symmetrical Chest wall movement, Good air movement bilaterally, CTAB.  7. RRR, No Gallops,  Rubs or Murmurs, No Parasternal Heave.  8. Positive Bowel Sounds, Abdomen Soft, No tenderness, No organomegaly appriciated,No rebound -guarding or  rigidity.  9.  No Cyanosis,  Redness over the Left buttock with 31m opening in the upper right buttock  ? Fistula Slight blackness area 1cm x2 over the Left  buttock  ? Developing wound.   10. Good muscle tone,  joints appear normal , no effusions, Normal ROM.  11. No Palpable Lymph Nodes in Neck or Axillae      Data Review:    CBC Recent Labs  Lab 06/26/18 2106  WBC 16.1*  HGB 10.7*  HCT 32.6*  PLT 143*  MCV 93.4  MCH 30.7  MCHC 32.8  RDW 14.5   ------------------------------------------------------------------------------------------------------------------  Chemistries  Recent Labs  Lab 06/26/18 2106  NA 136  K 5.1  CL 99  CO2 18*  GLUCOSE 112*  BUN 100*  CREATININE 5.59*  CALCIUM 8.5*  AST 22  ALT 13  ALKPHOS 131*  BILITOT 1.6*   ------------------------------------------------------------------------------------------------------------------ estimated creatinine clearance is 8 mL/min (A) (by C-G formula based on SCr of 5.59 mg/dL (H)). ------------------------------------------------------------------------------------------------------------------ No results for input(s): TSH, T4TOTAL, T3FREE, THYROIDAB in the last 72 hours.  Invalid input(s): FREET3  Coagulation profile Recent Labs  Lab 06/26/18 2106  INR 1.24   ------------------------------------------------------------------------------------------------------------------- No results for input(s): DDIMER in the last 72 hours. -------------------------------------------------------------------------------------------------------------------  Cardiac Enzymes No results for input(s): CKMB, TROPONINI, MYOGLOBIN in the last 168 hours.  Invalid input(s): CK ------------------------------------------------------------------------------------------------------------------ No results found for:  BNP   ---------------------------------------------------------------------------------------------------------------  Urinalysis    Component Value Date/Time   COLORURINE YELLOW 03/07/2016 0715   APPEARANCEUR HAZY (A) 03/07/2016 0715   LABSPEC 1.015 03/07/2016 0715   PHURINE 5.5 03/07/2016 0715   GLUCOSEU NEGATIVE 03/07/2016 0715   HGBUR LARGE (A) 03/07/2016 0715   BILIRUBINUR NEGATIVE 03/07/2016 0715   KETONESUR NEGATIVE 03/07/2016 0715   PROTEINUR 100 (A) 03/07/2016 0715   NITRITE NEGATIVE 03/07/2016 0715   LEUKOCYTESUR SMALL (A) 03/07/2016 0715    ----------------------------------------------------------------------------------------------------------------   Imaging Results:    No results found.     Assessment & Plan:    Principal Problem:   Hypotension Active Problems:   Hypothyroidism   ARF (acute renal failure) (HCC)   Black stool   Anemia    Hypotension ? Dehydration, ? Sepsis, ? GI bleeding Tele Trop I q6h x3 Check urinalysis Check CT chest/ abd/ pelvis Check cortisol Check cardiac echo NS 5023mx1 and then 12566mer hour If not improving may need input from PCCM  ARF secondary to dehydration and Losartan  Check urine sodium, urine creatinine , urine eosinophils Check urinalysis Check CT abd/ pelvis r/o hydronephrosis STOP Losartan STOP Lasix Hydrate with Ns as above Nephrology consult by ED  Black stool/ Anemia Type and screen NPO except for medication Protonix 60m49m x1 then 8mg 99mr GI consult by ED  Cellulitis left  buttock + wound ? fistula Blood culture x2 Zosyn iv pharmacy to dose No vanco due to ARF CT scan as above Check cbc in am Wound consult ? Fistula GI to please comment on opening on the right buttock r/o fistula   Gout Cont allopurinol 100mg 68mday  Hypothyroidism Check tsh Cont levothyroxine  Anxiety/ insomnia HOLD lorazepam due to soft bp  OSA  Cont Cpap if bp allows    DVT Prophylaxis - SCDs    AM Labs Ordered, also please review Full Orders  Family Communication: Admission, patients condition and plan of  care including tests being ordered have been discussed with the patient  who indicate understanding and agree with the plan and Code Status.  Code Status  FULL CODE  Likely DC to  TBD  Condition GUARDED    Consults called: GI by ED, Nephrology by ED  Admission status: inpatient  Time spent in minutes : 70, critical care   Jani Gravel M.D on 06/26/2018 at 11:56 PM  Between 7am to 7pm - Pager - [phone]  . After 7pm go to [email or web address] - password [facility]  [facility] - Office  [phone]

## 2018-06-26 NOTE — ED Triage Notes (Addendum)
[facility] was [facility] for full work-up for dark stools, paleness, dehydration, hypotension, etc. No labs/xrays completed prior to patient being sent here. States has had diarrhea since Sunday with today having black tarry stools.

## 2018-06-27 ENCOUNTER — Inpatient Hospital Stay (HOSPITAL_COMMUNITY): Payer: Medicare Other

## 2018-06-27 ENCOUNTER — Encounter (HOSPITAL_COMMUNITY): Payer: Self-pay | Admitting: Anesthesiology

## 2018-06-27 ENCOUNTER — Inpatient Hospital Stay (HOSPITAL_COMMUNITY): Payer: Medicare Other | Admitting: Anesthesiology

## 2018-06-27 ENCOUNTER — Encounter (HOSPITAL_COMMUNITY)
Admission: EM | Disposition: A | Discharge status: Rehabilitation Facility | Payer: Self-pay | Source: Home / Self Care | Attending: Family Medicine

## 2018-06-27 DIAGNOSIS — E038 Other specified hypothyroidism: Secondary | ICD-10-CM

## 2018-06-27 DIAGNOSIS — D649 Anemia, unspecified: Secondary | ICD-10-CM

## 2018-06-27 DIAGNOSIS — I361 Nonrheumatic tricuspid (valve) insufficiency: Secondary | ICD-10-CM

## 2018-06-27 DIAGNOSIS — L0231 Cutaneous abscess of buttock: Secondary | ICD-10-CM | POA: Insufficient documentation

## 2018-06-27 DIAGNOSIS — A419 Sepsis, unspecified organism: Secondary | ICD-10-CM

## 2018-06-27 DIAGNOSIS — K922 Gastrointestinal hemorrhage, unspecified: Secondary | ICD-10-CM | POA: Insufficient documentation

## 2018-06-27 DIAGNOSIS — L8990 Pressure ulcer of unspecified site, unspecified stage: Secondary | ICD-10-CM

## 2018-06-27 DIAGNOSIS — I351 Nonrheumatic aortic (valve) insufficiency: Secondary | ICD-10-CM

## 2018-06-27 DIAGNOSIS — L03317 Cellulitis of buttock: Secondary | ICD-10-CM

## 2018-06-27 DIAGNOSIS — I959 Hypotension, unspecified: Secondary | ICD-10-CM

## 2018-06-27 HISTORY — PX: INCISION AND DRAINAGE PERIRECTAL ABSCESS: SHX1804

## 2018-06-27 LAB — COMPREHENSIVE METABOLIC PANEL
ALK PHOS: 101 U/L (ref 38–126)
ALT: 12 U/L (ref 0–44)
ANION GAP: 13 (ref 5–15)
AST: 19 U/L (ref 15–41)
Albumin: 2.2 g/dL — ABNORMAL LOW (ref 3.5–5.0)
BILIRUBIN TOTAL: 1.3 mg/dL — AB (ref 0.3–1.2)
BUN: 96 mg/dL — AB (ref 8–23)
CO2: 21 mmol/L — ABNORMAL LOW (ref 22–32)
CREATININE: 5.04 mg/dL — AB (ref 0.44–1.00)
Calcium: 7.8 mg/dL — ABNORMAL LOW (ref 8.9–10.3)
Chloride: 107 mmol/L (ref 98–111)
GFR, EST AFRICAN AMERICAN: 8 mL/min — AB (ref >60)
GFR, EST NON AFRICAN AMERICAN: 7 mL/min — AB (ref >60)
Glucose, Bld: 85 mg/dL (ref 70–99)
Potassium: 4.6 mmol/L (ref 3.5–5.1)
SODIUM: 141 mmol/L (ref 135–145)
TOTAL PROTEIN: 5.3 g/dL — AB (ref 6.5–8.1)

## 2018-06-27 LAB — URINALYSIS, ROUTINE W REFLEX MICROSCOPIC
Bilirubin Urine: NEGATIVE
Glucose, UA: NEGATIVE mg/dL
HGB URINE DIPSTICK: NEGATIVE
Ketones, ur: NEGATIVE mg/dL
Nitrite: NEGATIVE
PROTEIN: NEGATIVE mg/dL
SPECIFIC GRAVITY, URINE: 1.012 (ref 1.005–1.030)
pH: 5 (ref 5.0–8.0)

## 2018-06-27 LAB — CBC
HCT: 28.7 % — ABNORMAL LOW (ref 36.0–46.0)
HEMATOCRIT: 28.7 % — AB (ref 36.0–46.0)
HEMOGLOBIN: 9.5 g/dL — AB (ref 12.0–15.0)
Hemoglobin: 9 g/dL — ABNORMAL LOW (ref 12.0–15.0)
MCH: 29.8 pg (ref 26.0–34.0)
MCH: 30.8 pg (ref 26.0–34.0)
MCHC: 31.4 g/dL (ref 30.0–36.0)
MCHC: 33.1 g/dL (ref 30.0–36.0)
MCV: 95 fL (ref 78.0–100.0)
MCV: 93.2 fL (ref 78.0–100.0)
Platelets: 119 10*3/uL — ABNORMAL LOW (ref 150–400)
Platelets: 124 10*3/uL — ABNORMAL LOW (ref 150–400)
RBC: 3.02 MIL/uL — ABNORMAL LOW (ref 3.87–5.11)
RBC: 3.08 MIL/uL — AB (ref 3.87–5.11)
RDW: 14.7 % (ref 11.5–15.5)
RDW: 14.9 % (ref 11.5–15.5)
WBC: 13 10*3/uL — AB (ref 4.0–10.5)
WBC: 8 10*3/uL (ref 4.0–10.5)

## 2018-06-27 LAB — CORTISOL: CORTISOL PLASMA: 37.5 ug/dL

## 2018-06-27 LAB — HEMOGLOBIN AND HEMATOCRIT, BLOOD
HCT: 25.2 % — ABNORMAL LOW (ref 36.0–46.0)
HEMOGLOBIN: 8.1 g/dL — AB (ref 12.0–15.0)

## 2018-06-27 LAB — TROPONIN I
TROPONIN I: 0.03 ng/mL — AB (ref <0.03)
Troponin I: <0.03 ng/mL (ref <0.03)

## 2018-06-27 LAB — CREATININE, URINE, RANDOM: Creatinine, Urine: 81.71 mg/dL

## 2018-06-27 LAB — MAGNESIUM: MAGNESIUM: 2.1 mg/dL (ref 1.7–2.4)

## 2018-06-27 LAB — SODIUM, URINE, RANDOM: Sodium, Ur: <10 mmol/L

## 2018-06-27 LAB — ECHOCARDIOGRAM COMPLETE
Height: 64 in
WEIGHTICAEL: 3590.85 [oz_av]

## 2018-06-27 LAB — MRSA PCR SCREENING: MRSA by PCR: NEGATIVE

## 2018-06-27 LAB — GLUCOSE, CAPILLARY: Glucose-Capillary: 98 mg/dL (ref 70–99)

## 2018-06-27 SURGERY — INCISION AND DRAINAGE, ABSCESS, PERIRECTAL
Anesthesia: General | Laterality: Left

## 2018-06-27 MED ORDER — ROCURONIUM BROMIDE 10 MG/ML (PF) SYRINGE
PREFILLED_SYRINGE | INTRAVENOUS | Status: AC
  Filled 2018-06-27: qty 20

## 2018-06-27 MED ORDER — LACTATED RINGERS IV BOLUS
500.0000 mL | Freq: Once | INTRAVENOUS | Status: AC
  Administered 2018-06-27: 500 mL via INTRAVENOUS

## 2018-06-27 MED ORDER — 0.9 % SODIUM CHLORIDE (POUR BTL) OPTIME
TOPICAL | Status: DC | PRN
  Administered 2018-06-27: 1000 mL

## 2018-06-27 MED ORDER — PHENYLEPHRINE HCL-NACL 10-0.9 MG/250ML-% IV SOLN
0.0000 ug/min | INTRAVENOUS | Status: DC
  Administered 2018-06-28: 60 ug/min via INTRAVENOUS
  Administered 2018-06-28: 20 ug/min via INTRAVENOUS
  Filled 2018-06-27 (×3): qty 250

## 2018-06-27 MED ORDER — PROPOFOL 10 MG/ML IV BOLUS
INTRAVENOUS | Status: DC | PRN
  Administered 2018-06-27: 20 mg via INTRAVENOUS
  Administered 2018-06-27: 60 mg via INTRAVENOUS

## 2018-06-27 MED ORDER — ALBUMIN HUMAN 5 % IV SOLN
12.5000 g | Freq: Once | INTRAVENOUS | Status: AC
  Administered 2018-06-27: 12.5 g via INTRAVENOUS

## 2018-06-27 MED ORDER — FENTANYL CITRATE (PF) 100 MCG/2ML IJ SOLN
INTRAMUSCULAR | Status: DC | PRN
  Administered 2018-06-27 (×4): 25 ug via INTRAVENOUS

## 2018-06-27 MED ORDER — SODIUM CHLORIDE 0.9 % IV SOLN
INTRAVENOUS | Status: DC
  Administered 2018-06-27 – 2018-07-02 (×12): via INTRAVENOUS

## 2018-06-27 MED ORDER — ONDANSETRON HCL 4 MG/2ML IJ SOLN
INTRAMUSCULAR | Status: DC | PRN
  Administered 2018-06-27: 4 mg via INTRAVENOUS

## 2018-06-27 MED ORDER — PHENYLEPHRINE HCL 10 MG/ML IJ SOLN
INTRAMUSCULAR | Status: DC | PRN
  Administered 2018-06-27: 40 ug via INTRAVENOUS

## 2018-06-27 MED ORDER — BUPIVACAINE-EPINEPHRINE (PF) 0.25% -1:200000 IJ SOLN
INTRAMUSCULAR | Status: AC
  Filled 2018-06-27: qty 30

## 2018-06-27 MED ORDER — PHENYLEPHRINE HCL 10 MG/ML IJ SOLN
INTRAMUSCULAR | Status: DC | PRN
  Administered 2018-06-27: 30 ug/min via INTRAVENOUS

## 2018-06-27 MED ORDER — ALBUMIN HUMAN 5 % IV SOLN
INTRAVENOUS | Status: AC
  Filled 2018-06-27: qty 250

## 2018-06-27 MED ORDER — PROPOFOL 10 MG/ML IV BOLUS
INTRAVENOUS | Status: AC
  Filled 2018-06-27: qty 20

## 2018-06-27 MED ORDER — ALBUMIN HUMAN 5 % IV SOLN
12.5000 g | Freq: Once | INTRAVENOUS | Status: AC
Start: 2018-06-27 — End: 2018-06-27
  Administered 2018-06-27: 12.5 g via INTRAVENOUS

## 2018-06-27 MED ORDER — LIDOCAINE 2% (20 MG/ML) 5 ML SYRINGE
INTRAMUSCULAR | Status: DC | PRN
  Administered 2018-06-27: 50 mg via INTRAVENOUS

## 2018-06-27 MED ORDER — SUGAMMADEX SODIUM 200 MG/2ML IV SOLN
INTRAVENOUS | Status: AC
  Filled 2018-06-27: qty 4

## 2018-06-27 MED ORDER — SODIUM CHLORIDE 0.9 % IV BOLUS
500.0000 mL | Freq: Once | INTRAVENOUS | Status: AC
  Administered 2018-06-27: 500 mL via INTRAVENOUS

## 2018-06-27 MED ORDER — SUCCINYLCHOLINE CHLORIDE 200 MG/10ML IV SOSY
PREFILLED_SYRINGE | INTRAVENOUS | Status: AC
  Filled 2018-06-27: qty 30

## 2018-06-27 MED ORDER — CLINDAMYCIN PHOSPHATE 600 MG/50ML IV SOLN
600.0000 mg | Freq: Three times a day (TID) | INTRAVENOUS | Status: DC
  Administered 2018-06-28 – 2018-07-02 (×14): 600 mg via INTRAVENOUS
  Filled 2018-06-27 (×14): qty 50

## 2018-06-27 MED ORDER — FAMOTIDINE IN NACL 20-0.9 MG/50ML-% IV SOLN
20.0000 mg | INTRAVENOUS | Status: DC
  Administered 2018-06-28 – 2018-06-30 (×2): 20 mg via INTRAVENOUS
  Filled 2018-06-27 (×2): qty 50

## 2018-06-27 MED ORDER — LIDOCAINE 2% (20 MG/ML) 5 ML SYRINGE
INTRAMUSCULAR | Status: AC
  Filled 2018-06-27: qty 15

## 2018-06-27 MED ORDER — FENTANYL CITRATE (PF) 100 MCG/2ML IJ SOLN: 25.0000 ug | INTRAMUSCULAR | Status: DC | PRN

## 2018-06-27 MED ORDER — GLYCOPYRROLATE PF 0.2 MG/ML IJ SOSY
PREFILLED_SYRINGE | INTRAMUSCULAR | Status: DC | PRN
  Administered 2018-06-27: .1 mg via INTRAVENOUS

## 2018-06-27 MED ORDER — PIPERACILLIN-TAZOBACTAM IN DEX 2-0.25 GM/50ML IV SOLN
2.2500 g | Freq: Three times a day (TID) | INTRAVENOUS | Status: DC
  Administered 2018-06-28 – 2018-06-30 (×7): 2.25 g via INTRAVENOUS
  Filled 2018-06-27 (×9): qty 50

## 2018-06-27 MED ORDER — OXYCODONE HCL 5 MG PO TABS: 5.0000 mg | ORAL_TABLET | Freq: Once | ORAL | Status: DC | PRN

## 2018-06-27 MED ORDER — PHENYLEPHRINE 40 MCG/ML (10ML) SYRINGE FOR IV PUSH (FOR BLOOD PRESSURE SUPPORT)
PREFILLED_SYRINGE | INTRAVENOUS | Status: AC
Start: 2018-06-27 — End: ?
  Filled 2018-06-27: qty 20

## 2018-06-27 MED ORDER — TRAMADOL HCL 50 MG PO TABS
50.0000 mg | ORAL_TABLET | Freq: Four times a day (QID) | ORAL | Status: DC | PRN
Start: 2018-06-27 — End: 2018-07-11
  Administered 2018-06-27 – 2018-07-10 (×22): 50 mg via ORAL
  Filled 2018-06-27 (×23): qty 1

## 2018-06-27 MED ORDER — LEVOTHYROXINE SODIUM 25 MCG PO TABS
137.0000 ug | ORAL_TABLET | Freq: Every day | ORAL | Status: DC
Start: 2018-06-27 — End: 2018-07-11
  Administered 2018-06-27 – 2018-07-11 (×15): 137 ug via ORAL
  Filled 2018-06-27 (×15): qty 1

## 2018-06-27 MED ORDER — SERTRALINE HCL 50 MG PO TABS
50.0000 mg | ORAL_TABLET | Freq: Every day | ORAL | Status: DC
  Administered 2018-06-27 – 2018-07-11 (×15): 50 mg via ORAL
  Filled 2018-06-27 (×15): qty 1

## 2018-06-27 MED ORDER — SODIUM CHLORIDE 0.9 % IJ SOLN
INTRAMUSCULAR | Status: AC
  Filled 2018-06-27: qty 10

## 2018-06-27 MED ORDER — VANCOMYCIN HCL IN DEXTROSE 750-5 MG/150ML-% IV SOLN: 750.0000 mg | Freq: Two times a day (BID) | INTRAVENOUS | Status: DC

## 2018-06-27 MED ORDER — EPHEDRINE 5 MG/ML INJ
INTRAVENOUS | Status: AC
  Filled 2018-06-27: qty 10

## 2018-06-27 MED ORDER — SODIUM CHLORIDE 0.9 % IV SOLN
1.0000 g | INTRAVENOUS | Status: DC
  Administered 2018-06-27: 1 g via INTRAVENOUS
  Filled 2018-06-27 (×2): qty 10

## 2018-06-27 MED ORDER — FENTANYL CITRATE (PF) 250 MCG/5ML IJ SOLN
INTRAMUSCULAR | Status: AC
  Filled 2018-06-27: qty 5

## 2018-06-27 MED ORDER — VANCOMYCIN HCL 10 G IV SOLR
1500.0000 mg | Freq: Once | INTRAVENOUS | Status: AC
  Administered 2018-06-27: 1500 mg via INTRAVENOUS
  Filled 2018-06-27: qty 1500

## 2018-06-27 MED ORDER — EPHEDRINE SULFATE 50 MG/ML IJ SOLN
INTRAMUSCULAR | Status: DC | PRN
  Administered 2018-06-27: 10 mg via INTRAVENOUS
  Administered 2018-06-27: 5 mg via INTRAVENOUS
  Administered 2018-06-27: 10 mg via INTRAVENOUS
  Administered 2018-06-27: 5 mg via INTRAVENOUS

## 2018-06-27 MED ORDER — OXYCODONE HCL 5 MG/5ML PO SOLN: 5.0000 mg | Freq: Once | ORAL | Status: DC | PRN

## 2018-06-27 MED ORDER — ALLOPURINOL 100 MG PO TABS
100.0000 mg | ORAL_TABLET | Freq: Every day | ORAL | Status: DC
  Administered 2018-06-27 – 2018-07-11 (×15): 100 mg via ORAL
  Filled 2018-06-27 (×15): qty 1

## 2018-06-27 MED ORDER — METRONIDAZOLE IN NACL 5-0.79 MG/ML-% IV SOLN
500.0000 mg | Freq: Three times a day (TID) | INTRAVENOUS | Status: DC
  Administered 2018-06-27: 500 mg via INTRAVENOUS
  Filled 2018-06-27: qty 100

## 2018-06-27 MED ORDER — ONDANSETRON HCL 4 MG/2ML IJ SOLN
INTRAMUSCULAR | Status: AC
  Filled 2018-06-27: qty 4

## 2018-06-27 MED ORDER — ARTIFICIAL TEARS OPHTHALMIC OINT
TOPICAL_OINTMENT | OPHTHALMIC | Status: AC
  Filled 2018-06-27: qty 3.5

## 2018-06-27 MED ORDER — ONDANSETRON HCL 4 MG/2ML IJ SOLN: 4.0000 mg | Freq: Four times a day (QID) | INTRAMUSCULAR | Status: DC | PRN

## 2018-06-27 MED ORDER — SODIUM CHLORIDE 0.9 % IV SOLN
INTRAVENOUS | Status: DC
Start: 2018-06-27 — End: 2018-06-28
  Administered 2018-06-28: 01:00:00 via INTRAVENOUS

## 2018-06-27 SURGICAL SUPPLY — 35 items
BNDG GAUZE ELAST 4 BULKY (GAUZE/BANDAGES/DRESSINGS) ×2 IMPLANT
BRIEF STRETCH FOR OB PAD LRG (UNDERPADS AND DIAPERS) ×3 IMPLANT
CANISTER SUCT 3000ML PPV (MISCELLANEOUS) ×3 IMPLANT
COVER SURGICAL LIGHT HANDLE (MISCELLANEOUS) ×3 IMPLANT
DRSG PAD ABDOMINAL 8X10 ST (GAUZE/BANDAGES/DRESSINGS) ×3 IMPLANT
ELECT CAUTERY BLADE 6.4 (BLADE) IMPLANT
ELECT REM PT RETURN 9FT ADLT (ELECTROSURGICAL) ×3
ELECTRODE REM PT RTRN 9FT ADLT (ELECTROSURGICAL) ×1 IMPLANT
GAUZE IODOFORM PACK 1/2 7832 (GAUZE/BANDAGES/DRESSINGS) IMPLANT
GLOVE BIO SURGEON STRL SZ8 (GLOVE) ×3 IMPLANT
GLOVE BIOGEL PI IND STRL 8 (GLOVE) ×1 IMPLANT
GLOVE BIOGEL PI INDICATOR 8 (GLOVE) ×2
GOWN STRL REUS W/ TWL LRG LVL3 (GOWN DISPOSABLE) ×1 IMPLANT
GOWN STRL REUS W/ TWL XL LVL3 (GOWN DISPOSABLE) ×1 IMPLANT
GOWN STRL REUS W/TWL LRG LVL3 (GOWN DISPOSABLE) ×3
GOWN STRL REUS W/TWL XL LVL3 (GOWN DISPOSABLE) ×3
KIT BASIN OR (CUSTOM PROCEDURE TRAY) ×3 IMPLANT
KIT TURNOVER KIT B (KITS) ×3 IMPLANT
NDL HYPO 25GX1X1/2 BEV (NEEDLE) IMPLANT
NEEDLE HYPO 25GX1X1/2 BEV (NEEDLE) IMPLANT
NS IRRIG 1000ML POUR BTL (IV SOLUTION) ×3 IMPLANT
PACK LITHOTOMY IV (CUSTOM PROCEDURE TRAY) ×3 IMPLANT
PAD ARMBOARD 7.5X6 YLW CONV (MISCELLANEOUS) ×3 IMPLANT
PENCIL BUTTON HOLSTER BLD 10FT (ELECTRODE) ×3 IMPLANT
SPONGE LAP 18X18 RF (DISPOSABLE) ×3 IMPLANT
SWAB COLLECTION DEVICE MRSA (MISCELLANEOUS) ×2 IMPLANT
SWAB CULTURE ESWAB REG 1ML (MISCELLANEOUS) ×2 IMPLANT
SYR BULB IRRIGATION 50ML (SYRINGE) ×3 IMPLANT
SYR CONTROL 10ML LL (SYRINGE) IMPLANT
TOWEL OR 17X24 6PK STRL BLUE (TOWEL DISPOSABLE) ×3 IMPLANT
TOWEL OR 17X26 10 PK STRL BLUE (TOWEL DISPOSABLE) ×3 IMPLANT
TUBE CONNECTING 12'X1/4 (SUCTIONS) ×1
TUBE CONNECTING 12X1/4 (SUCTIONS) ×2 IMPLANT
UNDERPAD 30X30 (UNDERPADS AND DIAPERS) ×3 IMPLANT
YANKAUER SUCT BULB TIP NO VENT (SUCTIONS) ×3 IMPLANT

## 2018-06-27 NOTE — Consult Note (Signed)
[facility] Renal Consultation Note  Requesting MD: Dr. Maudie Mercury Indication for Consultation:  AKI  HPI: [name] is a 84 y.o. female. With GERD, HTN, OSA who is seen for AKI.  She presented to the ED last PM with a several day history of nausea (w/o emesis), malaise and diarrhea.  Home medications include ARB and lasix. No NSAIDs.   She was found to be hypotensive, severe AKI with Cr 5.6.  Urine Na < 5.  She was hydrated and as of this AM I/Os ~3.3/0.375L.  Admission UA shows hyaline casts. CT A/P with possible enteritis and buttock cellulitis.  She's been treated with ceftriaxone.     She currently feels a bit better but still not back to baseline.  GI consult following due to dark stools and diarrhea.   Daughter at bedside.   Creatinine, Ser  Date/Time Value Ref Range Status  06/27/2018 04:16 AM 5.04 (H) 0.44 - 1.00 mg/dL Final  06/26/2018 09:06 PM 5.59 (H) 0.44 - 1.00 mg/dL Final  03/09/2016 08:59 AM 1.40 (H) 0.44 - 1.00 mg/dL Final  03/08/2016 05:44 AM 1.54 (H) 0.44 - 1.00 mg/dL Final  03/07/2016 11:33 AM 1.88 (H) 0.44 - 1.00 mg/dL Final  03/07/2016 07:02 AM 1.80 (H) 0.44 - 1.00 mg/dL Final  03/07/2016 06:20 AM 1.85 (H) 0.44 - 1.00 mg/dL Final  03/06/2016 12:28 PM 1.68 (H) 0.44 - 1.00 mg/dL Final  03/03/2008 11:10 AM 1.27 (H)  Final   PMHx:   Past Medical History:  Diagnosis Date  . Chest pain   . Depression   . GERD (gastroesophageal reflux disease)   . Hypertension   . Sleep apnea   . Thyroid disease     Past Surgical History:  Procedure Laterality Date  . APPENDECTOMY  1946  . CATARACT EXTRACTION     bilateral  . GALLBLADDER SURGERY    . KNEE ARTHROPLASTY  2006   left   . KNEE ARTHROPLASTY  2006   right   . SHOULDER ARTHROSCOPY  2009   right  . TOTAL HIP ARTHROPLASTY  2002    left    Family Hx:  Family History  Problem Relation Age of Onset  . Diabetes Mother   . Stroke Mother   . Cancer Brother   . Kidney disease Sister   Niece on  HD  Social History:  reports that she has quit smoking. She has never used smokeless tobacco. She reports that she does not drink alcohol or use drugs.  Allergies:  Allergies  Allergen Reactions  . Gabapentin Other (See Comments)    Alters mood  . Lyrica [Pregabalin] Other (See Comments)    Lethargic, mood changes  . Morphine And Related Hives and Itching  . Lipitor [Atorvastatin] Itching and Rash  . Sulfa Antibiotics Rash    Medications: Prior to Admission medications   Medication Sig Start Date End Date Taking? Authorizing Provider  allopurinol (ZYLOPRIM) 100 MG tablet Take 100 mg by mouth daily.     [provider]  calcium citrate-vitamin D (CITRACAL+D) 315-200 MG-UNIT per tablet Take 1 tablet by mouth daily.    [provider]  ciprofloxacin (CIPRO) 500 MG tablet Take 1 tablet (500 mg total) by mouth 2 (two) times daily. Patient not taking: Reported on 06/26/2018 03/09/16   Robbie Lis, MD  doxycycline (VIBRAMYCIN) 100 MG capsule Take 1 capsule (100 mg total) by mouth 2 (two) times daily. One po bid x 7 days Patient not taking: Reported on 06/26/2018 04/16/17  Orlie Dakin, MD  furosemide (LASIX) 80 MG tablet Take 80 mg by mouth 2 (two) times daily.    [provider]  guaiFENesin (MUCINEX) 600 MG 12 hr tablet Take 1 tablet (600 mg total) by mouth 2 (two) times daily as needed for cough or to loosen phlegm. 03/09/16   Robbie Lis, MD  levothyroxine (SYNTHROID, LEVOTHROID) 137 MCG tablet Take 137 mcg by mouth daily before breakfast.    [provider]  LORazepam (ATIVAN) 1 MG tablet Take 1 mg by mouth at bedtime.    [provider]  Misc Natural Products (GLUCOSAMINE CHONDROITIN VIT D3) CAPS Take 1 capsule by mouth daily.     [provider]  Multiple Vitamin (MULTIVITAMIN WITH MINERALS) TABS tablet Take 1 tablet by mouth daily.    [provider]  nitroGLYCERIN (NITROSTAT) 0.4 MG SL tablet Place 0.4 mg under the  tongue every 5 (five) minutes as needed for chest pain.    [provider]  omeprazole (PRILOSEC) 40 MG capsule Take 40 mg by mouth daily.    [provider]  sertraline (ZOLOFT) 50 MG tablet Take 50 mg by mouth daily.    [provider]    I have reviewed the patient's current medications.  Labs:  Results for orders placed or performed during the hospital encounter of 06/26/18 (from the past 48 hour(s))  Type and screen [place]     Status: None   Collection Time: 06/26/18  9:03 PM  Result Value Ref Range   ABO/RH(D) B POS    Antibody Screen NEG    Sample Expiration      06/29/2018 Performed at [facility], [place] [address], [place], [place] [address]   ABO/Rh     Status: None   Collection Time: 06/26/18  9:03 PM  Result Value Ref Range   ABO/RH(D)      B POS Performed at [facility] [address], [place], [place] [address]   Comprehensive metabolic panel     Status: Abnormal   Collection Time: 06/26/18  9:06 PM  Result Value Ref Range   Sodium 136 135 - 145 mmol/L   Potassium 5.1 3.5 - 5.1 mmol/L   Chloride 99 98 - 111 mmol/L   CO2 18 (L) 22 - 32 mmol/L   Glucose, Bld 112 (H) 70 - 99 mg/dL   BUN 100 (H) 8 - 23 mg/dL   Creatinine, Ser 5.59 (H) 0.44 - 1.00 mg/dL   Calcium 8.5 (L) 8.9 - 10.3 mg/dL   Total Protein 6.1 (L) 6.5 - 8.1 g/dL   Albumin 2.7 (L) 3.5 - 5.0 g/dL   AST 22 15 - 41 U/L   ALT 13 0 - 44 U/L   Alkaline Phosphatase 131 (H) 38 - 126 U/L   Total Bilirubin 1.6 (H) 0.3 - 1.2 mg/dL   GFR calc non Af Amer 6 (L) >60 mL/min   GFR calc Af Amer 7 (L) >60 mL/min    Comment: (NOTE) The eGFR has been calculated using the CKD EPI equation. This calculation has not been validated in all clinical situations. eGFR's persistently <60 mL/min signify possible Chronic Kidney Disease.    Anion gap 19 (H) 5 - 15    Comment: Performed at [facility], [place] [address], [place], [place] [address]  CBC      Status: Abnormal   Collection Time: 06/26/18  9:06 PM  Result Value Ref Range   WBC  16.1 (H) 4.0 - 10.5 K/uL   RBC 3.49 (L) 3.87 - 5.11 MIL/uL   Hemoglobin 10.7 (L) 12.0 - 15.0 g/dL   HCT 32.6 (L) 36.0 - 46.0 %   MCV 93.4 78.0 - 100.0 fL   MCH 30.7 26.0 - 34.0 pg   MCHC 32.8 30.0 - 36.0 g/dL   RDW 14.5 11.5 - 15.5 %   Platelets 143 (L) 150 - 400 K/uL    Comment: Performed at [facility] [address], [place], [place] [address]  Protime-INR - (order if Patient is taking Coumadin / Warfarin)     Status: Abnormal   Collection Time: 06/26/18  9:06 PM  Result Value Ref Range   Prothrombin Time 15.5 (H) 11.4 - 15.2 seconds   INR 1.24     Comment: Performed at [facility] [address] [place], [place] [address]  POC occult blood, ED     Status: Abnormal   Collection Time: 06/26/18  9:33 PM  Result Value Ref Range   Fecal Occult Bld POSITIVE (A) NEGATIVE  I-stat troponin, ED     Status: None   Collection Time: 06/26/18  9:38 PM  Result Value Ref Range   Troponin i, poc 0.04 0.00 - 0.08 ng/mL   Comment 3            Comment: Due to the release kinetics of cTnI, a negative result within the first hours of the onset of symptoms does not rule out myocardial infarction with certainty. If myocardial infarction is still suspected, repeat the test at appropriate intervals.   I-Stat CG4 Lactic Acid, ED     Status: Abnormal   Collection Time: 06/26/18  9:40 PM  Result Value Ref Range   Lactic Acid, Venous 2.96 (HH) 0.5 - 1.9 mmol/L   Comment NOTIFIED PHYSICIAN   I-Stat CG4 Lactic Acid, ED     Status: None   Collection Time: 06/26/18 11:32 PM  Result Value Ref Range   Lactic Acid, Venous 1.87 0.5 - 1.9 mmol/L  Troponin I (q 6hr x 3)     Status: None   Collection Time: 06/27/18 12:03 AM  Result Value Ref Range   Troponin I <0.03 <0.03 ng/mL    Comment: Performed at [facility], [place] [address] [place], [place] [address]  Magnesium     Status: None   Collection  Time: 06/27/18 12:03 AM  Result Value Ref Range   Magnesium 2.1 1.7 - 2.4 mg/dL    Comment: Performed at [facility], [place] [address] [place], [place] [address]  Urinalysis, Routine w reflex microscopic     Status: Abnormal   Collection Time: 06/27/18 12:43 AM  Result Value Ref Range   Color, Urine YELLOW YELLOW   APPearance HAZY (A) CLEAR   Specific Gravity, Urine 1.012 1.005 - 1.030   pH 5.0 5.0 - 8.0   Glucose, UA NEGATIVE NEGATIVE mg/dL   Hgb urine dipstick NEGATIVE NEGATIVE   Bilirubin Urine NEGATIVE NEGATIVE   Ketones, ur NEGATIVE NEGATIVE mg/dL   Protein, ur NEGATIVE NEGATIVE mg/dL   Nitrite NEGATIVE NEGATIVE   Leukocytes, UA SMALL (A) NEGATIVE   RBC / HPF 0-5 0 - 5 RBC/hpf   WBC, UA 0-5 0 - 5 WBC/hpf   Bacteria, UA RARE (A) NONE SEEN   Squamous Epithelial / LPF 0-5 0 - 5   Mucus PRESENT    Hyaline Casts, UA PRESENT     Comment: Performed at [facility], [place] [address], [place], [place] [address]  Sodium, urine, random     Status: None   Collection Time: 06/27/18 12:43 AM  Result Value Ref Range   Sodium, Ur <10 mmol/L    Comment: Performed at [facility] [address], [place], [place] [address]  Creatinine, urine, random     Status: None   Collection Time: 06/27/18 12:43 AM  Result Value Ref Range   Creatinine, Urine 81.71 mg/dL    Comment: Performed at [facility] [address], [place], [place] [address]  MRSA PCR Screening     Status: None   Collection Time: 06/27/18  3:16 AM  Result Value Ref Range   MRSA by PCR NEGATIVE NEGATIVE    Comment:        The GeneXpert MRSA Assay (FDA approved for NASAL specimens only), is one component of a comprehensive MRSA colonization surveillance program. It is not intended to diagnose MRSA infection nor to guide or monitor treatment for MRSA infections. Performed at [facility], [place] [address] [place], [place] [address]   Troponin I (q 6hr x 3)     Status: None   Collection Time:  06/27/18  4:16 AM  Result Value Ref Range   Troponin I <0.03 <0.03 ng/mL    Comment: Performed at [facility] [address], [place], [place] [address]  Cortisol     Status: None   Collection Time: 06/27/18  4:16 AM  Result Value Ref Range   Cortisol, Plasma 37.5 ug/dL    Comment: (NOTE) AM    6.7 - 22.6 ug/dL PM   <10.0       ug/dL Performed at [facility] [address] [place], [place] [address]   Comprehensive metabolic panel     Status: Abnormal   Collection Time: 06/27/18  4:16 AM  Result Value Ref Range   Sodium 141 135 - 145 mmol/L   Potassium 4.6 3.5 - 5.1 mmol/L   Chloride 107 98 - 111 mmol/L   CO2 21 (L) 22 - 32 mmol/L   Glucose, Bld 85 70 - 99 mg/dL   BUN 96 (H) 8 - 23 mg/dL   Creatinine, Ser 5.04 (H) 0.44 - 1.00 mg/dL   Calcium 7.8 (L) 8.9 - 10.3 mg/dL   Total Protein 5.3 (L) 6.5 - 8.1 g/dL   Albumin 2.2 (L) 3.5 - 5.0 g/dL   AST 19 15 - 41 U/L   ALT 12 0 - 44 U/L   Alkaline Phosphatase 101 38 - 126 U/L   Total Bilirubin 1.3 (H) 0.3 - 1.2 mg/dL   GFR calc non Af Amer 7 (L) >60 mL/min   GFR calc Af Amer 8 (L) >60 mL/min    Comment: (NOTE) The eGFR has been calculated using the CKD EPI equation. This calculation has not been validated in all clinical situations. eGFR's persistently <60 mL/min signify possible Chronic Kidney Disease.    Anion gap 13 5 - 15    Comment: Performed at [facility] [address], [place],  [address]  CBC     Status: Abnormal   Collection Time: 06/27/18  4:16 AM  Result Value Ref Range   WBC 13.0 (H) 4.0 - 10.5 K/uL   RBC 3.02 (L) 3.87 - 5.11 MIL/uL   Hemoglobin 9.0 (L) 12.0 - 15.0 g/dL   HCT 28.7 (L) 36.0 - 46.0 %   MCV 95.0 78.0 - 100.0 fL  MCH 29.8 26.0 - 34.0 pg   MCHC 31.4 30.0 - 36.0 g/dL   RDW 14.7 11.5 - 15.5 %   Platelets 119 (L) 150 - 400 K/uL    Comment: PLATELET COUNT CONFIRMED BY SMEAR Performed at [facility], [place] [address] [place], [place] [address]       ROS:  Pertinent items are noted in HPI.   Physical Exam: Vitals:   06/27/18 0321 06/27/18 0803  BP: (!) 84/51 (!) 98/44  Pulse:  73  Resp: (!) 24 19  Temp: 97.6 F (36.4 C) 97.8 F (36.6 C)  SpO2: 100% 93%     General: elderly woman lying flat in bed comfortably HEENT: MM tacky Eyes: anicteric sclera Neck: no JVD  Heart: RRR Lungs: clear throughout Abdomen: soft, mildly TTP L  Extremities: no edema Skin: no rashes Neuro: nonfocal  Assessment/Plan: 1.  Severe AKI: most likely hypovolemia evidenced by low urine sodium, hypotension and clinical circumstance.  Hopefully didn't sustain ATN injury from hypotension/ARB but the urine sodium is encouraging that she didn't.  She's started making urine with fluids and hopefully we'll continue to see improvement.  If she did develop ATN and her recovery is going to be more protracted we can discuss dialysis versus not further.  I briefly discussed it with them today -- she certainly isn't a good long term dialysis patient but if it's short term and she wishes to proceed I think we could discuss further.    2.  Hypovolemia: secondary to acute losses.  On NS 100/hr currently after several boluses.  If MAPs remain low this AM I discussed with the hospitalist to give another NS 550m bolus PRN.    3.  Anemia: do not think related to AKI and no ESA is indicated; further w/u per GI/primary.   4.  HTN: continue to hold meds  KJustin Mend7/31/2019, 10:19 AM

## 2018-06-27 NOTE — Anesthesia Preprocedure Evaluation (Signed)
Anesthesia Evaluation  Patient identified by MRN, date of birth, ID band Patient awake    Reviewed: Allergy & Precautions, H&P , NPO status , Patient's Chart, lab work & pertinent test results  Airway Mallampati: II   Neck ROM: full    Dental   Pulmonary sleep apnea , former smoker,    breath sounds clear to auscultation       Cardiovascular hypertension, +CHF   Rhythm:regular Rate:Normal     Neuro/Psych PSYCHIATRIC DISORDERS Anxiety Depression    GI/Hepatic GERD  ,  Endo/Other  Hypothyroidism obese  Renal/GU Renal Insufficiency and ARFRenal disease     Musculoskeletal   Abdominal   Peds  Hematology  (+) anemia ,   Anesthesia Other Findings   Reproductive/Obstetrics                             Anesthesia Physical  Anesthesia Plan  ASA: III  Anesthesia Plan: General   Post-op Pain Management:    Induction: Intravenous  PONV Risk Score and Plan: 3 and Ondansetron, Dexamethasone and Treatment may vary due to age or medical condition  Airway Management Planned: LMA  Additional Equipment:   Intra-op Plan:   Post-operative Plan:   Informed Consent: I have reviewed the patients History and Physical, chart, labs and discussed the procedure including the risks, benefits and alternatives for the proposed anesthesia with the patient or authorized representative who has indicated his/her understanding and acceptance.     Plan Discussed with: CRNA, Anesthesiologist and Surgeon  Anesthesia Plan Comments:         Anesthesia Quick Evaluation  

## 2018-06-27 NOTE — Consult Note (Addendum)
WOC Nurse wound consult note [facility] in the presence of her daughter. Reason for Consult: Buttock wound Wound type: Unclear etiology.    Three darkened necrotic areas are present on the patient's LEFT buttock with surrounding erythema consistent with cellulitis extending all the way to her left labia.  The most superior wound measures 1 cm x 1 cm x unknown depth.  The middle wound measures 2 cm x 2 cm x unknown depth.  The wound closest to the labia measures 4 cm x 4 cm x unknown depth.  I suspect these wounds communicate beneath the superficial layers, but cannot determine this at this time.    The patient states this area became "sore" about a week ago and she started putting "fanny cream" on it.  Her daughter states that when she helped her mother get dressed to go the the doctor's office yesterday she saw the top two areas and at that time they were like fluid filled blisters.  Today all are highly necrotic with a foul odor.   I have spoken via the phone with Dr. Maryfrances Bunnellanford and expressed the urgent need to ask surgery to look at her wounds.  I think they will benefit from surgical involvement.  Dr. Maryfrances Bunnellanford will follow up with this.  For now, I have ordered for an air mattress to be placed, and instructions related to linens.  The best wound option for debridement and exploration of the areas is surgical in nature.   Monitor the wound area(s) for worsening of condition such as: Signs/symptoms of infection,  Increase in size,  Development of or worsening of odor, Development of pain, or increased pain at the affected locations.  Notify the medical team if any of these develop.  Thank you for the consult.  Discussed plan of care with the patient and bedside nurse.  Helmut MusterSherry Shakera Ebrahimi, RN, MSN, CWOCN, CNS-BC, pager [phone]

## 2018-06-27 NOTE — Anesthesia Procedure Notes (Signed)
Procedure Name: LMA Insertion Date/Time: 06/27/2018 5:34 PM Performed by: Julian ReilWelty, Antione Obar F, CRNA Pre-anesthesia Checklist: Patient identified, Emergency Drugs available, Suction available, Patient being monitored and Timeout performed Patient Re-evaluated:Patient Re-evaluated prior to induction Oxygen Delivery Method: Circle system utilized Preoxygenation: Pre-oxygenation with 100% oxygen Induction Type: IV induction Ventilation: Mask ventilation without difficulty LMA: LMA inserted LMA Size: 4.0 Tube type: Oral Number of attempts: 1 Placement Confirmation: positive ETCO2 Tube secured with: Tape Dental Injury: Teeth and Oropharynx as per pre-operative assessment

## 2018-06-27 NOTE — Progress Notes (Signed)
eLink Physician-Brief Progress Note Patient Name: Rebecca KayGracie B Campos DOB: 11/04/1928 MRN: [id number]   Date of Service  06/27/2018  HPI/Events of Note  Necrotizing fascitis involving buttock s/p debridement in the OR this evening, post-op hypotension  eICU Interventions  Antibiotics switched to Vancomycin/ Zosyn/ Clindamycin with pharmacy to modify Vancomycin and Clindamycin doses as appropriate for renal failure, check stat lactic acid/ hemoglobin levels, aggressive volume resuscitation with crystalloid + Albumin pending results of Hemoglobin, transient Phenylephrine infusion but will transition to Norepinephrine and place a central line if Lactate is elevated or hypotension persists. ICU monitoring.        Thomasene Lotkoronkwo U Ogan 06/27/2018, 11:19 PM

## 2018-06-27 NOTE — Progress Notes (Signed)
  Echocardiogram 2D Echocardiogram has been performed.  [name] 06/27/2018, 3:21 PM

## 2018-06-27 NOTE — Progress Notes (Signed)
BP 81/46 after albumin.  Will give second albumin and continue to monitor.

## 2018-06-27 NOTE — Progress Notes (Signed)
eLink Physician-Brief Progress Note Patient Name: Rebecca KayGracie B Castaneda DOB: 09/20/1928 MRN: [id number]   Date of Service  06/27/2018  HPI/Events of Note  84 yo with probable GIB Low BP 80/55  Renal failure LA 2.9-->1.8 2.5 L given PCCM asked for consultation  eICU Interventions  PCCM to consult May consider step down status vs ICU LA improved with IVF's Foley catheter for UOP     Intervention Category Major Interventions: Hypotension - evaluation and management Evaluation Type: New Patient Evaluation  Lyrika Souders 06/27/2018, 12:24 AM

## 2018-06-27 NOTE — Progress Notes (Signed)
Initial Nutrition Assessment  DOCUMENTATION CODES:   Obesity unspecified  INTERVENTION:    Advance diet as medically appropriate; add supplements when/as able  NUTRITION DIAGNOSIS:   Inadequate oral intake related to inability to eat as evidenced by NPO status  GOAL:   Patient will meet greater than or equal to 90% of their needs  MONITOR:   Diet advancement, PO intake, Supplement acceptance, Labs, Weight trends, Skin, I & O's  REASON FOR ASSESSMENT:   Malnutrition Screening Tool   ASSESSMENT:  84 y.o. Female with HTN, hypothyroidism who presented with 1 week buttock pain, followed by malaise, diarrhea, and weakness.     RD spoke with pt's daughter at bedside.  Daughter reports pt has been nauseous with no appetite. Last time pt ate solid food was at her daughter's house on Sun, 7/28.  Pt was not drinking any oral nutrition supplements PTA. Surgery notes reviewed. Pt is NPO for I&D procedure. Labs and medications reviewed. Mg, K WNL.  Pt reports she has intentionally lost about 60 lbs in the last year. She states her MD told her she needed to lose weight and be more active. Pt will benefit from addition of oral nutrition supplements post-op.  NUTRITION - FOCUSED PHYSICAL EXAM:  Completed. No muscle or fat loss depletions noticed.  Diet Order:   Diet Order           Diet NPO time specified  Diet effective now         EDUCATION NEEDS:   Not appropriate for education at this time  Skin:  Skin Assessment: Skin Integrity Issues: Skin Integrity Issues:: Other (Comment) Other: L buttock abscess  Last BM:  7/30  Height:   Ht Readings from Last 1 Encounters:  06/27/18 5\' 4"  (1.626 m)   Weight:   Wt Readings from Last 1 Encounters:  06/27/18 224 lb 6.9 oz (101.8 kg)   BMI:  Body mass index is 38.52 kg/m.  Estimated Nutritional Needs:   Kcal:  1800-1950  Protein:  90-105 gm  Fluid:  1.8-1.9 L  Maureen ChattersKatie Linnea Todisco, RD, LDN Pager #:  [phone] After-Hours Pager #: [phone]

## 2018-06-27 NOTE — Consult Note (Signed)
[facility]  Referring Provider: Dr. Joen Laurahristopher Danford [facility]) Primary Care Physician:  Kirby FunkGriffin, John, MD  Reason for Consultation:  Diarrhea, blood in stool, anemia  HPI: [name] is a 84 y.o. female admitted for acute renal failure and dehydration.  She has three day history of nausea and malaise, couple day history of diarrhea (voluminous) and one day history of dark stools.  No bowel movements since yesterday.  Has left periumbilical pain starting few days ago.  Reports possible prior bleeding many years ago, doesn't recall details. Endorses prior history of endoscopy and colonoscopy but over 10 years ago.  No blood thinners.  No hematemesis.  Takes PPI at home.  CT showed possible cellulitis left buttock and possible small bowel enteritis.  Baseline mild anemia ~10-11, 9 on admission.   Past Medical History:  Diagnosis Date  . Chest pain   . Depression   . GERD (gastroesophageal reflux disease)   . Hypertension   . Sleep apnea   . Thyroid disease     Past Surgical History:  Procedure Laterality Date  . APPENDECTOMY  1946  . CATARACT EXTRACTION     bilateral  . GALLBLADDER SURGERY    . KNEE ARTHROPLASTY  2006   left   . KNEE ARTHROPLASTY  2006   right   . SHOULDER ARTHROSCOPY  2009   right  . TOTAL HIP ARTHROPLASTY  2002    left    Prior to Admission medications   Medication Sig Start Date End Date Taking? Authorizing Provider  allopurinol (ZYLOPRIM) 100 MG tablet Take 100 mg by mouth daily.     [provider]  calcium citrate-vitamin D (CITRACAL+D) 315-200 MG-UNIT per tablet Take 1 tablet by mouth daily.    [provider]  ciprofloxacin (CIPRO) 500 MG tablet Take 1 tablet (500 mg total) by mouth 2 (two) times daily. Patient not taking: Reported on 06/26/2018 03/09/16   Alison Murrayevine, Alma M, MD  doxycycline (VIBRAMYCIN) 100 MG capsule Take 1 capsule (100 mg total) by mouth 2 (two) times daily. One po bid x 7 days Patient  not taking: Reported on 06/26/2018 04/16/17   Doug SouJacubowitz, Sam, MD  furosemide (LASIX) 80 MG tablet Take 80 mg by mouth 2 (two) times daily.    [provider]  guaiFENesin (MUCINEX) 600 MG 12 hr tablet Take 1 tablet (600 mg total) by mouth 2 (two) times daily as needed for cough or to loosen phlegm. 03/09/16   Alison Murrayevine, Alma M, MD  levothyroxine (SYNTHROID, LEVOTHROID) 137 MCG tablet Take 137 mcg by mouth daily before breakfast.    [provider]  LORazepam (ATIVAN) 1 MG tablet Take 1 mg by mouth at bedtime.    [provider]  Misc Natural Products (GLUCOSAMINE CHONDROITIN VIT D3) CAPS Take 1 capsule by mouth daily.     [provider]  Multiple Vitamin (MULTIVITAMIN WITH MINERALS) TABS tablet Take 1 tablet by mouth daily.    [provider]  nitroGLYCERIN (NITROSTAT) 0.4 MG SL tablet Place 0.4 mg under the tongue every 5 (five) minutes as needed for chest pain.    [provider]  omeprazole (PRILOSEC) 40 MG capsule Take 40 mg by mouth daily.    [provider]  sertraline (ZOLOFT) 50 MG tablet Take 50 mg by mouth daily.    [provider]    Current Facility-Administered Medications  Medication Dose Route Frequency Provider Last Rate Last Dose  . 0.9 %  sodium chloride infusion  Intravenous Continuous Desai, Rahul P, PA-C 100 mL/hr at 06/27/18 0300    . acetaminophen (TYLENOL) tablet 650 mg  650 mg Oral Q6H PRN Pearson Grippe, MD       Or  . acetaminophen (TYLENOL) suppository 650 mg  650 mg Rectal Q6H PRN Pearson Grippe, MD      . allopurinol (ZYLOPRIM) tablet 100 mg  100 mg Oral Daily Pearson Grippe, MD   100 mg at 06/27/18 0935  . cefTRIAXone (ROCEPHIN) 1 g in sodium chloride 0.9 % 100 mL IVPB  1 g Intravenous Q24H Desai, Rahul P, PA-C 200 mL/hr at 06/27/18 0436 1 g at 06/27/18 0436  . levothyroxine (SYNTHROID, LEVOTHROID) tablet 137 mcg  137 mcg Oral QAC breakfast Pearson Grippe, MD   137 mcg at 06/27/18 0945  . pantoprazole (PROTONIX)  80 mg in sodium chloride 0.9 % 250 mL (0.32 mg/mL) infusion  8 mg/hr Intravenous Continuous Pearson Grippe, MD 25 mL/hr at 06/27/18 0106 8 mg/hr at 06/27/18 0106  . sertraline (ZOLOFT) tablet 50 mg  50 mg Oral Daily Pearson Grippe, MD   50 mg at 06/27/18 0935  . traMADol (ULTRAM) tablet 50 mg  50 mg Oral Q6H PRN Leda Gauze, NP   50 mg at 06/27/18 0439    Allergies as of 06/26/2018 - Review Complete 06/26/2018  Allergen Reaction Noted  . Gabapentin Other (See Comments) 05/06/2015  . Lyrica [pregabalin] Other (See Comments) 05/06/2015  . Morphine and related Hives and Itching 05/06/2015  . Lipitor [atorvastatin] Itching and Rash 05/06/2015  . Sulfa antibiotics Rash 05/06/2015    Family History  Problem Relation Age of Onset  . Diabetes Mother   . Stroke Mother   . Cancer Brother   . Kidney disease Sister     Social History   Socioeconomic History  . Marital status: Widowed    Spouse name: Not on file  . Number of children: 4  . Years of education: Not on file  . Highest education level: Not on file  Occupational History  . Occupation: Retired  Engineer, production  . Financial resource strain: Not on file  . Food insecurity:    Worry: Not on file    Inability: Not on file  . Transportation needs:    Medical: Not on file    Non-medical: Not on file  Tobacco Use  . Smoking status: Former Games developer  . Smokeless tobacco: Never Used  Substance and Sexual Activity  . Alcohol use: No    Alcohol/week: 0.0 oz  . Drug use: No  . Sexual activity: Not on file  Lifestyle  . Physical activity:    Days per week: Not on file    Minutes per session: Not on file  . Stress: Not on file  Relationships  . Social connections:    Talks on phone: Not on file    Gets together: Not on file    Attends religious service: Not on file    Active member of club or organization: Not on file    Attends meetings of clubs or organizations: Not on file    Relationship status: Not on file  . Intimate  partner violence:    Fear of current or ex partner: Not on file    Emotionally abused: Not on file    Physically abused: Not on file    Forced sexual activity: Not on file  Other Topics Concern  . Not on file  Social History Narrative  . Not on file  Review of Systems: As per HPI, all others negative  Physical Exam: Vital signs in last 24 hours: Temp:  [97.6 F (36.4 C)-97.9 F (36.6 C)] 97.8 F (36.6 C) (07/31 0803) Pulse Rate:  [30-88] 73 (07/31 0803) Resp:  [10-29] 19 (07/31 0803) BP: (78-132)/(39-116) 98/44 (07/31 0803) SpO2:  [90 %-100 %] 93 % (07/31 0803) Weight:  [98.9 kg (218 lb)-101.8 kg (224 lb 6.9 oz)] 101.8 kg (224 lb 6.9 oz) (07/31 0321) Last BM Date: 06/26/18 General:   Alert, elderly but younger-appearing than age, Well-developed, well-nourished, pleasant and cooperative in NAD Head:  Normocephalic and atraumatic. Eyes:  Sclera clear, no icterus.   Conjunctiva pale Ears:  Normal auditory acuity. Nose:  No deformity, discharge,  or lesions. Mouth:  No deformity or lesions.  Oropharynx pale and dry Neck:  Supple; no masses or thyromegaly. Lungs:  Clear throughout to auscultation.   No wheezes, crackles, or rhonchi. No acute distress. Heart:  Regular rate and rhythm; no murmurs, clicks, rubs,  or gallops. Abdomen:  Soft, mild left periumbilical tenderness without peritonitis. No masses, hepatosplenomegaly or hernias noted. Normal bowel sounds, without guarding, and without rebound.     Msk:  Symmetrical without gross deformities. Normal posture. Pulses:  Normal pulses noted. Extremities:  Without clubbing or edema. Neurologic:  Alert and  oriented x4; diffusely weak Skin:  Intact without significant lesions or rashes. Cervical Nodes:  No significant cervical adenopathy. Psych:  Alert and cooperative. Normal mood and affect.   Lab Results: Recent Labs    06/26/18 2106 06/27/18 0416  WBC 16.1* 13.0*  HGB 10.7* 9.0*  HCT 32.6* 28.7*  PLT 143* 119*    BMET Recent Labs    06/26/18 2106 06/27/18 0416  NA 136 141  K 5.1 4.6  CL 99 107  CO2 18* 21*  GLUCOSE 112* 85  BUN 100* 96*  CREATININE 5.59* 5.04*  CALCIUM 8.5* 7.8*   LFT Recent Labs    06/27/18 0416  PROT 5.3*  ALBUMIN 2.2*  AST 19  ALT 12  ALKPHOS 101  BILITOT 1.3*   PT/INR Recent Labs    06/26/18 2106  LABPROT 15.5*  INR 1.24    Studies/Results: Ct Abdomen Pelvis Wo Contrast  Result Date: 06/27/2018 CLINICAL DATA:  Initial evaluation for acute abdominal pain. EXAM: CT ABDOMEN AND PELVIS WITHOUT CONTRAST TECHNIQUE: Multidetector CT imaging of the abdomen and pelvis was performed following the standard protocol without IV contrast. COMPARISON:  Prior CT from 03/16/2011. FINDINGS: Lower chest: Scattered subsegmental atelectatic changes present within the visualized lung bases. 7 mm nodular density at the right lung base (series 4, image 2), slightly more prominent as compared to previous exam. Cardiomegaly partially visualized. Hepatobiliary: Limited noncontrast evaluation of the liver is unremarkable. Gallbladder surgically absent. No biliary dilatation. Pancreas: Pancreas somewhat atrophic in appearance. No acute peripancreatic inflammation. Spleen: Spleen within normal limits. Adrenals/Urinary Tract: Left adrenal adenoma noted, stable. Asymmetric atrophy of the left kidney is compared to the right, similar to previous. No nephrolithiasis or hydronephrosis. No hydroureter. Bladder largely decompressed with a Foley catheter in place. Gas lucency within the bladder lumen consistent with catheterization. Stomach/Bowel: Small hiatal hernia. Stomach otherwise unremarkable. Hazy inflammatory stranding about a loop of small bowel within the left abdomen (series 3, image 44), suggesting possible acute enteritis. Small bowel of normal caliber without obstruction. Colon diffusely decompressed. Extensive sigmoid diverticulosis without evidence for acute diverticulitis.  Vascular/Lymphatic: Moderate to advanced aorto bi-iliac atherosclerotic disease. No aneurysm. No pathologically enlarged intra-abdominal or pelvic lymph  nodes. Shotty subcentimeter mesenteric lymph nodes noted within the area of inflammatory stranding within the mid abdomen, which may be reactive. Reproductive: Uterus within normal limits. Calcification noted at the left ovary. 2.5 cm simple right adnexal cyst, most likely benign given size and appearance. Other: No free air or fluid. Small fat containing paraumbilical hernia without associated inflammation. Musculoskeletal: Asymmetric hazy stranding within the partially visualized subcutaneous fat of the left buttock (series 3, image 91). No loculated collections. Left hip arthroplasty in place. Degenerative changes at the right hip. No acute osseus abnormality. No worrisome lytic or blastic osseous lesions. Advanced degenerative spondylolysis and facet arthrosis throughout the visualized spine. IMPRESSION: 1. Focal hazy inflammatory stranding adjacent to a loop of small bowel within the left abdomen, suspicious for acute enteritis. No associated obstruction or other complication identified. 2. Asymmetric hazy inflammatory stranding within the subcutaneous fat of the left buttock, nonspecific, but could reflect sequelae of acute cellulitis. Correlation with physical exam recommended. No discrete collections identified. 3. Sigmoid diverticulosis without evidence for acute diverticulitis. 4. Cardiomegaly with advanced aorto bi-iliac atherosclerotic disease. 5. 7 mm nodule at the right lung base, increased in prominence as compared to 2012. Non-contrast chest CT at 6-12 months is recommended. If the nodule is stable at time of repeat CT, then future CT at 18-24 months (from today's scan) is considered optional for low-risk patients, but is recommended for high-risk patients. This recommendation follows the consensus statement: Guidelines for Management of Incidental  Pulmonary Nodules Detected on CT Images: From the Fleischner Society 2017; Radiology 2017; 284:228-243. Electronically Signed   By: Rise Mu M.D.   On: 06/27/2018 02:06   Dg Chest Port 1 View  Result Date: 06/27/2018 CLINICAL DATA:  Cough tonight. EXAM: PORTABLE CHEST 1 VIEW COMPARISON:  Radiograph 05/16/2016 FINDINGS: The heart is enlarged. Indistinct perihilar pulmonary vascularity. No focal airspace disease. No pleural effusion or pneumothorax. No acute osseous abnormalities are seen. IMPRESSION: Cardiomegaly. Indistinct perihilar pulmonary vasculature suggesting vascular congestion and or edema. No pneumonia. Electronically Signed   By: Rubye Oaks M.D.   On: 06/27/2018 01:50   Impression:  1.  Acute onset nausea and diarrhea.  Concern for infectious process. 2.  Dark stools (none since yesterday).  Possibly from enteritis? 3.  Acute anemia. 4.  Abnormal CT scan, enteritis. 5.  Dehydration with acute renal failure. 6.  Cellulitis left buttock with fistula?  Plan:  1.  Antibiotics (cellulitis). 2.  Serial CBCs. 3.  PPI. 4.  Hydration. 5.  Clear liquid diet ok. 6.  If black stools subside and hemoglobin stabilizes, then no further GI intervention would be planned; on the other hand, if black stools continue with ongoing hemoglobin drop, would need to consider endoscopy (but doubt scope would reach the area of "enteritis" on CT scan). 7.  [facility] will follow.    LOS: 1 day   Siham Bucaro M  06/27/2018, 9:53 AM  Cell [phone] If no answer or after 5 PM call [phone]

## 2018-06-27 NOTE — Consult Note (Signed)
PULMONARY / CRITICAL CARE MEDICINE  Name: [name] MRN: [id number] DOB: 01-May-1928    ADMISSION DATE:  06/26/2018 CONSULTATION DATE:  06/27/18  REFERRING MD :  Selena Batten  CHIEF COMPLAINT:  Hypotension   HISTORY OF PRESENT ILLNESS:  [name] is a 84 y.o. female with a PMH as outlined below.  She presented to [facility] ED 7/30 with generalized weakness, decreased PO intake, right buttock pain.  Symptoms started 2 days prior and progressed.  1 day prior to presentation also had diarrhea that was dark black.  She had taken one dose of pepto bismol 2 days prior.  Denies NSAIDS, ASA. Denies any fevers/chills/sweats (though states she is always cold), headaches, chest pain, nausea, vomiting, myalgias.  Has had mild dry cough and some exertional dyspnea which she states is chronic and has not worsened.  Does have some generalized abdominal tenderness.  Felt so weak that went to urgent care 7/30 and while there, was told that she was "dry" so should come to ED.  In ED, she was noted to have SBP in 80s along with AKI (SCr 5.5 with baseline around 1.5).  She received 1.5L NS bolus and SBP improved to low 90s.  PCCM was called for assistance.  During my exam, I had discussions with pt, her 2 daughters and 1 son in law. We discussed pt's current circumstances and organ failures. We also discussed patient's prior wishes under circumstances such as this. The family has decided not to perform resuscitation if arrest were to occur, but to otherwise continue with current medical support / therapies.  Pt states "I stopped getting colonoscopies, mammograms, routine checkups because of my age.  When it's my time, I'm OK with that".   PAST MEDICAL HISTORY :   has a past medical history of Chest pain, Depression, GERD (gastroesophageal reflux disease), Hypertension, Sleep apnea, and Thyroid disease.  has a past surgical history that includes Total hip arthroplasty (2002); Knee Arthroplasty (2006); Knee Arthroplasty  (2006); Shoulder arthroscopy (2009); Cataract extraction; Gallbladder surgery; and Appendectomy (1946). Prior to Admission medications   Medication Sig Start Date End Date Taking? Authorizing Provider  allopurinol (ZYLOPRIM) 100 MG tablet Take 100 mg by mouth daily.     [provider]  calcium citrate-vitamin D (CITRACAL+D) 315-200 MG-UNIT per tablet Take 1 tablet by mouth daily.    [provider]  ciprofloxacin (CIPRO) 500 MG tablet Take 1 tablet (500 mg total) by mouth 2 (two) times daily. Patient not taking: Reported on 06/26/2018 03/09/16   Alison Murray, MD  doxycycline (VIBRAMYCIN) 100 MG capsule Take 1 capsule (100 mg total) by mouth 2 (two) times daily. One po bid x 7 days Patient not taking: Reported on 06/26/2018 04/16/17   Doug Sou, MD  furosemide (LASIX) 80 MG tablet Take 80 mg by mouth 2 (two) times daily.    [provider]  guaiFENesin (MUCINEX) 600 MG 12 hr tablet Take 1 tablet (600 mg total) by mouth 2 (two) times daily as needed for cough or to loosen phlegm. 03/09/16   Alison Murray, MD  levothyroxine (SYNTHROID, LEVOTHROID) 137 MCG tablet Take 137 mcg by mouth daily before breakfast.    [provider]  LORazepam (ATIVAN) 1 MG tablet Take 1 mg by mouth at bedtime.    [provider]  Misc Natural Products (GLUCOSAMINE CHONDROITIN VIT D3) CAPS Take 1 capsule by mouth daily.     [provider]  Multiple Vitamin (MULTIVITAMIN WITH MINERALS) TABS tablet Take  1 tablet by mouth daily.    [provider]  nitroGLYCERIN (NITROSTAT) 0.4 MG SL tablet Place 0.4 mg under the tongue every 5 (five) minutes as needed for chest pain.    [provider]  omeprazole (PRILOSEC) 40 MG capsule Take 40 mg by mouth daily.    [provider]  sertraline (ZOLOFT) 50 MG tablet Take 50 mg by mouth daily.    [provider]   Allergies  Allergen Reactions  . Gabapentin Other (See Comments)    Alters mood   . Lyrica [Pregabalin] Other (See Comments)    Lethargic, mood changes  . Morphine And Related Hives and Itching  . Lipitor [Atorvastatin] Itching and Rash  . Sulfa Antibiotics Rash    FAMILY HISTORY:  family history includes Cancer in her brother; Diabetes in her mother; Kidney disease in her sister; Stroke in her mother. SOCIAL HISTORY:  reports that she has quit smoking. She has never used smokeless tobacco. She reports that she does not drink alcohol or use drugs.  REVIEW OF SYSTEMS:   All negative; except for those that are bolded, which indicate positives.  Constitutional: weight loss, weight gain, night sweats, fevers, chills, fatigue, weakness.  HEENT: headaches, sore throat, sneezing, nasal congestion, post nasal drip, difficulty swallowing, tooth/dental problems, visual complaints, visual changes, ear aches. Neuro: difficulty with speech, weakness, numbness, ataxia. CV:  chest pain, orthopnea, PND, swelling in lower extremities, dizziness, palpitations, syncope.  Resp: cough, hemoptysis, dyspnea, wheezing. GI: heartburn, indigestion, abdominal pain, nausea, vomiting, diarrhea, constipation, change in bowel habits, loss of appetite, hematemesis, melena, hematochezia.  GU: dysuria, change in color of urine, urgency or frequency, flank pain, hematuria. MSK: joint pain or swelling, decreased range of motion. Psych: change in mood or affect, depression, anxiety, suicidal ideations, homicidal ideations. Skin: rash, itching, bruising, right buttock pain and redness.   SUBJECTIVE:  Feels somewhat better from when she first arrived.  SBP in low 90s, getting last 500cc bolus (will make a total of 2L).  2 daughters and son in law are at bedside.  VITAL SIGNS: Temp:  [97.9 F (36.6 C)] 97.9 F (36.6 C) (07/30 2054) Pulse Rate:  [30-83] 82 (07/30 2330) Resp:  [16-25] 16 (07/31 0030) BP: (78-132)/(39-116) 88/56 (07/31 0030) SpO2:  [93 %-100 %] 95 % (07/31 0030) Weight:  [98.9 kg  (218 lb)] 98.9 kg (218 lb) (07/30 2054)  PHYSICAL EXAMINATION: General: Adult female, resting in bed, in NAD. Neuro: A&O x 3, no deficits. HEENT: [place]/AT. Sclerae anicteric, EOMI. Cardiovascular: RRR, no M/R/G.  Lungs: Respirations even and unlabored.  CTA bilaterally, No W/R/R. Abdomen: Obese, BS x 4, soft.  Mild periumbilical tenderness. Musculoskeletal: No gross deformities, no edema.  Skin: Intact, warm.  Right buttock erythematous and has small black area that appears to be a hole / wound.  No drainage.   Recent Labs  Lab 06/26/18 2106  NA 136  K 5.1  CL 99  CO2 18*  BUN 100*  CREATININE 5.59*  GLUCOSE 112*   Recent Labs  Lab 06/26/18 2106  HGB 10.7*  HCT 32.6*  WBC 16.1*  PLT 143*   No results found.  STUDIES:  CXR 7/31 >  CT A / P 7/31 >   SIGNIFICANT EVENTS  7/31 > admit.  ASSESSMENT / PLAN:  Borderline hypotension - presumed primarily hypovolemic but can not rule out component of sepsis, likely buttock cellulitis.  BP has improved with fluids and suspect will continue to do so.  Of note, pt  unsure of what her baseline BP is.  Lactate reassuring. DNR Status. Plan: Continue IVF's. F/u on cortisol, stress roids if < 20. Start ceftriaxone and follow cultures. CT A / P ordered, f/u on results.  Might need drainage if abscess noted.  AKI - presumed pre-renal from hypovolemia / dehydration.  Exacerbated by lasix + losartan.  Can not rule out component ATN.  Currently anuric. Plan: Continue aggressive fluids. F/u on urine chemistries. F/u on CT A / P. Nephrology consulted by EDP (doubt pt would be long term dialysis candidate, but perhaps can consider short term if no improvement after fluids / supportive care). Agree hold losartan, lasix.  Melena. Plan: Transfuse for Hgb < 7. Continue protonix. GI consulted by EDP.  OSA - on nocturnal CPAP. Plan: Continue nocturnal CPAP.   Family updates:  During my exam, I had discussions with pt, her 2 daughters  and 1 son in Social workerlaw. We discussed pt's current circumstances and organ failures. We also discussed patient's prior wishes under circumstances such as this. The family has decided not to perform resuscitation if arrest were to occur, but to otherwise continue with current medical support / therapies.  Pt states "I stopped getting colonoscopies, mammograms, routine checkups because of my age.  When it's my time, I'm OK with that".   Rest per primary.  Nothing further to add.  Nothing further to add.  PCCM will sign off.  Please do not hesitate to call us back if we can be of any further assistance.    Rutherford Guysahul Esa Raden, [place] - [facility] Pager: [phone]  or [phone] 06/27/2018, 1:18 AM

## 2018-06-27 NOTE — Progress Notes (Signed)
PROGRESS NOTE    [name]  ZOX:[id number] DOB: 07/12/1928 DOA: 06/26/2018 PCP: Rebecca Funk, MD      Brief Narrative:  [name] is a 84 y.o. F with HTN, hypothyroidism who presents with 1 week buttock pain, followed by malaise, diarrhea, and weakness.    Went to UC, where she was told she was "dehydrated".  Came to the ER, where she was found to have BP 80/40, leukocytosis, Cr 5.5, and lactic acidosis.  Given 30 cc/kg fluids, lactate cleared, BP improved somewhat, she was mentating well with MAP>60.  Started on ceftriaxone for presumed UTI.  CCM were consulted, recommended continued medical therapy with fluids, antibiotics.  CT abdomen showed ?enteritis, also buttock cellulitis.  Buttock examination showed cellulitis with necrosis.     Assessment & Plan:  Sepsis Buttock cellulitis with necrosis -Continue ceftriaxone -Add vancomycin for MRSA coverage - Add Flagyl for anaerobic coverage -Follow blood cultures - Consult general surgery for possible debridement  Acute renal failure on chronic kidney disease stage III Baseline creatinine 1.4-1.5 Admitted with creatinine 5.5 minutes setting of ARB and Lasix use and sepsis and hypotension.  Slight improvement overnight.   -Avoid nephrotoxins -Consult Nephrology, appreciate cares -Hold Lasix, ARB -Continue IV fluids, bolus 500 cc now -Monitor I/Os  Chronic normocytic anemia Possible acute blood loss anemia Stools have "cleared", per patient.  Low suspicion for GI bleed.  Hemoglobin at baseline 10-11 at admission, drop overnight likely from dilution. -Change Protonix to famotidine given shortage -Consult gastroenterology, appreciate recommendations -Trend CBC every 12 hours  Enteritis This is a nonspecific finding on CT.  Monitor stools.  Hypothyroidism -Continue levothyroxine  Hypertension Hypotensive at admission -Hold BP meds  Other medications -Continue allopurinol -Continue Zoloft       DVT  prophylaxis: SCDs Code Status: DO NOT RESUSCITATE Family Communication: Daughter and son-in-law at the bedside MDM and disposition Plan: The below labs and imaging reports were reviewed and summarized above.    The patient was admitted with sepsis, found to be from a buttock cellulitis with necrosis and abscess, general surgery consulted for question of treatment.  She is on broad-spectrum antibiotics.  She is also got renal failure, nephrology consult, hopefully this will improve with time.  She also has a new anemia, and question of melena, gastroenterology are consulted.   Consultants:   GI  Nephrology  General Surgery  Procedures:   None  Antimicrobials:   Ceftriaxone 7/31 >>  Vancomycin 7/31 b>>  Flagyl 7/31 >>   Cultures:   7/30 Blood cultures x2: NGTD  7/30 Urine culture: NG      Subjective: Still with some pain in buttock.  Mentation good.  Mild abdominal pain, periumbilical.  No more dairreha, no more melena.  No vomiting.  No fever overnight.    Objective: Vitals:   06/27/18 0200 06/27/18 0202 06/27/18 0321 06/27/18 0803  BP: (!) 87/49 (!) 82/46 (!) 84/51 (!) 98/44  Pulse: 88   73  Resp: 10  (!) 24 19  Temp:   97.6 F (36.4 C) 97.8 F (36.6 C)  TempSrc:   Oral Oral  SpO2: 91%  100% 93%  Weight:   101.8 kg (224 lb 6.9 oz)   Height:   5\' 4"  (1.626 m)     Intake/Output Summary (Last 24 hours) at 06/27/2018 1211 Last data filed at 06/27/2018 0808 Gross per 24 hour  Intake 3421.83 ml  Output 375 ml  Net 3046.83 ml   Filed Weights   06/26/18 2054 06/27/18  0321  Weight: 98.9 kg (218 lb) 101.8 kg (224 lb 6.9 oz)    Examination: General appearance: Obese adult female, alert and in no acute distress.   Laying on side, interactive. HEENT: Anicteric, conjunctiva pink, lids and lashes normal. No nasal deformity, discharge, epistaxis.  Lips moist.   Skin: Warm and dry.  no jaundice.  No suspicious rashes or lesions except redness of right buttock,  with induration, exquisite tenderness and three 2-3 cm black necrotic areas.  NO active drainage. Cardiac: RRR, nl S1-S2, no murmurs appreciated.  Capillary refill is brisk.  JVP not visible.  No LE edema.  Radia  pulses 2+ and symmetric. Respiratory: Normal respiratory rate and rhythm.  CTAB without rales or wheezes. Abdomen: Abdomen soft.  No focal TTP, or guarding. No ascites, distension, hepatosplenomegaly.   MSK: No deformities or effusions. Neuro: Awake and alert.  EOMI, moves all extremities. Speech fluent.    Psych: Sensorium intact and responding to questions, attention normal. Affect normal.  Judgment and insight appear normal.    Data Reviewed: I have personally reviewed following labs and imaging studies:  CBC: Recent Labs  Lab 06/26/18 2106 06/27/18 0416  WBC 16.1* 13.0*  HGB 10.7* 9.0*  HCT 32.6* 28.7*  MCV 93.4 95.0  PLT 143* 119*   Basic Metabolic Panel: Recent Labs  Lab 06/26/18 2106 06/27/18 0003 06/27/18 0416  NA 136  --  141  K 5.1  --  4.6  CL 99  --  107  CO2 18*  --  21*  GLUCOSE 112*  --  85  BUN 100*  --  96*  CREATININE 5.59*  --  5.04*  CALCIUM 8.5*  --  7.8*  MG  --  2.1  --    GFR: Estimated Creatinine Clearance: 9 mL/min (A) (by C-G formula based on SCr of 5.04 mg/dL (H)). Liver Function Tests: Recent Labs  Lab 06/26/18 2106 06/27/18 0416  AST 22 19  ALT 13 12  ALKPHOS 131* 101  BILITOT 1.6* 1.3*  PROT 6.1* 5.3*  ALBUMIN 2.7* 2.2*   No results for input(s): LIPASE, AMYLASE in the last 168 hours. No results for input(s): AMMONIA in the last 168 hours. Coagulation Profile: Recent Labs  Lab 06/26/18 2106  INR 1.24   Cardiac Enzymes: Recent Labs  Lab 06/27/18 0003 06/27/18 0416 06/27/18 1039  TROPONINI <0.03 <0.03 0.03*   BNP (last 3 results) No results for input(s): PROBNP in the last 8760 hours. HbA1C: No results for input(s): HGBA1C in the last 72 hours. CBG: No results for input(s): GLUCAP in the last 168  hours. Lipid Profile: No results for input(s): CHOL, HDL, LDLCALC, TRIG, CHOLHDL, LDLDIRECT in the last 72 hours. Thyroid Function Tests: No results for input(s): TSH, T4TOTAL, FREET4, T3FREE, THYROIDAB in the last 72 hours. Anemia Panel: No results for input(s): VITAMINB12, FOLATE, FERRITIN, TIBC, IRON, RETICCTPCT in the last 72 hours. Urine analysis:    Component Value Date/Time   COLORURINE YELLOW 06/27/2018 0043   APPEARANCEUR HAZY (A) 06/27/2018 0043   LABSPEC 1.012 06/27/2018 0043   PHURINE 5.0 06/27/2018 0043   GLUCOSEU NEGATIVE 06/27/2018 0043   HGBUR NEGATIVE 06/27/2018 0043   BILIRUBINUR NEGATIVE 06/27/2018 0043   KETONESUR NEGATIVE 06/27/2018 0043   PROTEINUR NEGATIVE 06/27/2018 0043   NITRITE NEGATIVE 06/27/2018 0043   LEUKOCYTESUR SMALL (A) 06/27/2018 0043   Sepsis Labs: @LABRCNTIP (procalcitonin:4,lacticacidven:4)  ) Recent Results (from the past 240 hour(s))  MRSA PCR Screening     Status: None   Collection  Time: 06/27/18  3:16 AM  Result Value Ref Range Status   MRSA by PCR NEGATIVE NEGATIVE Final    Comment:        The GeneXpert MRSA Assay (FDA approved for NASAL specimens only), is one component of a comprehensive MRSA colonization surveillance program. It is not intended to diagnose MRSA infection nor to guide or monitor treatment for MRSA infections. Performed at [facility], [address], [place], [place] [address]          Radiology Studies: Ct Abdomen Pelvis Wo Contrast  Result Date: 06/27/2018 CLINICAL DATA:  Initial evaluation for acute abdominal pain. EXAM: CT ABDOMEN AND PELVIS WITHOUT CONTRAST TECHNIQUE: Multidetector CT imaging of the abdomen and pelvis was performed following the standard protocol without IV contrast. COMPARISON:  Prior CT from 03/16/2011. FINDINGS: Lower chest: Scattered subsegmental atelectatic changes present within the visualized lung bases. 7 mm nodular density at the right lung base (series 4, image 2),  slightly more prominent as compared to previous exam. Cardiomegaly partially visualized. Hepatobiliary: Limited noncontrast evaluation of the liver is unremarkable. Gallbladder surgically absent. No biliary dilatation. Pancreas: Pancreas somewhat atrophic in appearance. No acute peripancreatic inflammation. Spleen: Spleen within normal limits. Adrenals/Urinary Tract: Left adrenal adenoma noted, stable. Asymmetric atrophy of the left kidney is compared to the right, similar to previous. No nephrolithiasis or hydronephrosis. No hydroureter. Bladder largely decompressed with a Foley catheter in place. Gas lucency within the bladder lumen consistent with catheterization. Stomach/Bowel: Small hiatal hernia. Stomach otherwise unremarkable. Hazy inflammatory stranding about a loop of small bowel within the left abdomen (series 3, image 44), suggesting possible acute enteritis. Small bowel of normal caliber without obstruction. Colon diffusely decompressed. Extensive sigmoid diverticulosis without evidence for acute diverticulitis. Vascular/Lymphatic: Moderate to advanced aorto bi-iliac atherosclerotic disease. No aneurysm. No pathologically enlarged intra-abdominal or pelvic lymph nodes. Shotty subcentimeter mesenteric lymph nodes noted within the area of inflammatory stranding within the mid abdomen, which may be reactive. Reproductive: Uterus within normal limits. Calcification noted at the left ovary. 2.5 cm simple right adnexal cyst, most likely benign given size and appearance. Other: No free air or fluid. Small fat containing paraumbilical hernia without associated inflammation. Musculoskeletal: Asymmetric hazy stranding within the partially visualized subcutaneous fat of the left buttock (series 3, image 91). No loculated collections. Left hip arthroplasty in place. Degenerative changes at the right hip. No acute osseus abnormality. No worrisome lytic or blastic osseous lesions. Advanced degenerative spondylolysis  and facet arthrosis throughout the visualized spine. IMPRESSION: 1. Focal hazy inflammatory stranding adjacent to a loop of small bowel within the left abdomen, suspicious for acute enteritis. No associated obstruction or other complication identified. 2. Asymmetric hazy inflammatory stranding within the subcutaneous fat of the left buttock, nonspecific, but could reflect sequelae of acute cellulitis. Correlation with physical exam recommended. No discrete collections identified. 3. Sigmoid diverticulosis without evidence for acute diverticulitis. 4. Cardiomegaly with advanced aorto bi-iliac atherosclerotic disease. 5. 7 mm nodule at the right lung base, increased in prominence as compared to 2012. Non-contrast chest CT at 6-12 months is recommended. If the nodule is stable at time of repeat CT, then future CT at 18-24 months (from today's scan) is considered optional for low-risk patients, but is recommended for high-risk patients. This recommendation follows the consensus statement: Guidelines for Management of Incidental Pulmonary Nodules Detected on CT Images: From the Fleischner Society 2017; Radiology 2017; 284:228-243. Electronically Signed   By: Rise Mu M.D.   On: 06/27/2018 02:06   Dg  Chest Port 1 View  Result Date: 06/27/2018 CLINICAL DATA:  Cough tonight. EXAM: PORTABLE CHEST 1 VIEW COMPARISON:  Radiograph 05/16/2016 FINDINGS: The heart is enlarged. Indistinct perihilar pulmonary vascularity. No focal airspace disease. No pleural effusion or pneumothorax. No acute osseous abnormalities are seen. IMPRESSION: Cardiomegaly. Indistinct perihilar pulmonary vasculature suggesting vascular congestion and or edema. No pneumonia. Electronically Signed   By: Rubye Oaks M.D.   On: 06/27/2018 01:50        Scheduled Meds: . allopurinol  100 mg Oral Daily  . levothyroxine  137 mcg Oral QAC breakfast  . sertraline  50 mg Oral Daily   Continuous Infusions: . sodium chloride 100 mL/hr  at 06/27/18 0300  . cefTRIAXone (ROCEPHIN)  IV 1 g (06/27/18 0436)  . famotidine (PEPCID) IV    . metronidazole    . sodium chloride    . vancomycin       LOS: 1 day    Time spent: 35 minutes    Alberteen Sam, MD [facility] 06/27/2018, 12:11 PM     Pager [phone] --- please page though AMION:  [email or web address] If 7PM-7AM, please contact night-coverage

## 2018-06-27 NOTE — Anesthesia Postprocedure Evaluation (Signed)
Anesthesia Post Note  Patient: [name]  Procedure(s) Performed: IRRIGATION AND DEBRIDEMENT BUTTOCK ABSCESS (Left )     Patient location during evaluation: PACU Anesthesia Type: General Level of consciousness: awake Pain management: pain level controlled Vital Signs Assessment: post-procedure vital signs reviewed and stable Respiratory status: spontaneous breathing Cardiovascular status: stable Anesthetic complications: no    Last Vitals:  Vitals:   06/27/18 2045 06/27/18 2100  BP: (!) 86/43 (!) 89/48  Pulse: 64 82  Resp: (!) 24 (!) 26  Temp: 36.7 C   SpO2: 97% 99%    Last Pain:  Vitals:   06/27/18 2030  TempSrc:   PainSc: 0-No pain                 [name]

## 2018-06-27 NOTE — Progress Notes (Signed)
Pharmacy Antibiotic Note  [name] is a 84 y.o. female admitted on 06/26/2018 for AKI and dehydration. Patient presented with nausea x3days and diarrhea x2days. CT on 7/31 shows possible cellulitis of left buttock and ceftriaxone was started for the possible cellulitis. PMH includes GERD, HTN, and depression. Pharmacy has been consulted for Vancomycin dosing for the treatment of possible sepsis.  WBC trending down,13 today. Afebrile. AKI with Scr of 5.04 - likely due to dehydration.   Plan: Will dose Vancomycin 1500mg  x1 dose.  Will decide maintenance doses based on renal function tomorrow.  Monitor renal function, cx, and patient overall status daily.   Height: 5\' 4"  (162.6 cm) Weight: 224 lb 6.9 oz (101.8 kg) IBW/kg (Calculated) : 54.7  Temp (24hrs), Avg:97.8 F (36.6 C), Min:97.6 F (36.4 C), Max:97.9 F (36.6 C)  Recent Labs  Lab 06/26/18 2106 06/26/18 2140 06/26/18 2332 06/27/18 0416  WBC 16.1*  --   --  13.0*  CREATININE 5.59*  --   --  5.04*  LATICACIDVEN  --  2.96* 1.87  --     Estimated Creatinine Clearance: 9 mL/min (A) (by C-G formula based on SCr of 5.04 mg/dL (H)).    Allergies  Allergen Reactions  . Gabapentin Other (See Comments)    Alters mood  . Lyrica [Pregabalin] Other (See Comments)    Lethargic, mood changes  . Morphine And Related Hives and Itching  . Lipitor [Atorvastatin] Itching and Rash  . Sulfa Antibiotics Rash    Antimicrobials this admission: 7/31 Ceftriaxone >>  7/31 Flagyl >>    Microbiology results: 7/31 BCx:  7/31 UCx:   7/31 MRSA PCR:  Thank you for allowing pharmacy to be a part of this patient's care.  Rebecca AlbrightSara Christian Castaneda, Ilda BassetPharm D PGY1 Pharmacy Resident  Phone [phone] 06/27/2018   11:37 AM

## 2018-06-27 NOTE — Op Note (Signed)
06/27/2018  6:52 PM  PATIENT:  [name]  84 y.o. female  Patient Care Team: Lavone Orn, MD as PCP - General (Internal Medicine)  PRE-OPERATIVE DIAGNOSIS:  buttock abscess  POST-OPERATIVE DIAGNOSIS:  buttock abscess  PROCEDURE:   1. Excisional debridement of left gluteal necrotizing soft tissue infection  SURGEON:  Sharon Mt. Froylan Hobby, MD  ASSISTANT: Scrub nurse  ANESTHESIA:   general  COUNTS:  Sponge, needle and instrument counts were reported correct x2 at the conclusion of the operation.  EBL: 100cc  DRAINS: None  SPECIMEN: Left gluteal wound  COMPLICATIONS: None  FINDINGS: 3 foci of skin necrosis as detailed in H&P. All of this connected under the skin with large area of necrotic/gangrenous soft tissue. Debrided down to muscle with scissors. The final wound measured 18 cm x 18 cm x 6 cm deep.  DISPOSITION: PACU in satisfactory condition  INDICATION: Patient is a 84 year old female who presented to [facility] with L buttock pain and concern for abscess. Pain started about a week ago and has progressively worsened. Patient thought she just had a boil initially and then a second one a few days later. Has been applying desitin without improvement. Patient denies fevers/chills, but reports decreased PO intake, fatigue, decreased urine output. She also reports diarrhea and generalized abdominal cramping yesterday. Patient reports chronic cough related to medications. Allergic to gabapentin, sulfa abx, lyrica, and morphine. Patient reports the last time she had to be put to sleep she had some trouble waking up. Multiple family members present at bedside. Last food/drink cranberry juice 12:45 PM. On examination she had 3 foci of necrotic skin with concerns for underlying necrotizing infection. She was seen by our service and recommended OR for debridement. I met her and her family and reviewed the H&P with her. We discussed options moving forward and she opted to proceed with  debridement. Please refer to H&P for details regarding this discussion.   DESCRIPTION: The patient was identified in preop holding and taken to the OR where they were placed on the operating room table and SCDs were placed. General anesthesia was induced without difficulty. She was positioned in left lateral decubitus with a bean bag. Pressure points were padded and verified. The patient was then prepped and draped in the usual sterile fashion. A surgical timeout was performed indicating the correct patient, procedure, positioning and need for preoperative antibiotics.   The 3 foci of necrotic skin were excised sharply with a knife. Underneath, there was dishwater fluid and necrotic fat. These all connected under the skin and extended peripherally quite a distance. This was all opened sharply with scissors and knife and extended down to the level of the muscle of the lower back and up close to the anus. Anal sphincter was not grossly involved. The wound contents and skin were passed off as specimen. Cultures were also obtained. The final wound measured 18 cm wide x 18 cm long x 6 cm deep and was down to muscle (fascia partially removed which was overlying). The wound was irrigated with copious amounts of sterile saline. Hemostasis was achieved with electrocautery. No additional necrotic material remained. The wound was packed with 2 kerlex tied together and covered with abd pad. Mesh panties were placed. The patient was then awakened from anesthesia, extubated, and transferred to a stretcher for transport to PACU in satisfactory condition.

## 2018-06-27 NOTE — Progress Notes (Signed)
Pharmacy Antibiotic Note  Rebecca KayGracie B Castaneda is a 84 y.o. female admitted on 06/26/2018 with necrotizing fascitis .  Pharmacy has been consulted for Zosyn dosing. Already on vancomycin and clindamycin. Noted renal dysfunction.   Plan: Zosyn 2.25g IV q8h Increase dose if renal function improves Already on vanco/clinda  Height: 5\' 4"  (162.6 cm) Weight: 224 lb 6.9 oz (101.8 kg) IBW/kg (Calculated) : 54.7  Temp (24hrs), Avg:97.7 F (36.5 C), Min:97.3 F (36.3 C), Max:98.1 F (36.7 C)  Recent Labs  Lab 06/26/18 2106 06/26/18 2140 06/26/18 2332 06/27/18 0416 06/27/18 1624  WBC 16.1*  --   --  13.0* 8.0  CREATININE 5.59*  --   --  5.04*  --   LATICACIDVEN  --  2.96* 1.87  --   --     Estimated Creatinine Clearance: 9 mL/min (A) (by C-G formula based on SCr of 5.04 mg/dL (H)).    Allergies  Allergen Reactions  . Gabapentin Other (See Comments)    Alters mood  . Lyrica [Pregabalin] Other (See Comments)    Lethargic, mood changes  . Morphine And Related Hives and Itching  . Lipitor [Atorvastatin] Itching and Rash  . Sulfa Antibiotics Rash     Abran DukeLedford, Alexanderia Gorby 06/27/2018 11:36 PM

## 2018-06-27 NOTE — Progress Notes (Signed)
Patient seen and examined. I reviewed the H&P performed by Rebecca Guiles, PA-C with the patient. No changes to it.   Objective: Vital signs in last 24 hours: Temp:  [97.6 F (36.4 C)-97.9 F (36.6 C)] 97.8 F (36.6 C) (07/31 0803) Pulse Rate:  [30-88] 73 (07/31 0803) Resp:  [10-29] 19 (07/31 0803) BP: (78-132)/(39-116) 98/44 (07/31 0803) SpO2:  [90 %-100 %] 93 % (07/31 0803) Weight:  [98.9 kg (218 lb)-101.8 kg (224 lb 6.9 oz)] 101.8 kg (224 lb 6.9 oz) (07/31 0321) Last BM Date: 06/26/18  Intake/Output from previous day: 07/30 0701 - 07/31 0700 In: 3421.8 [I.V.:252.2; IV Piggyback:3169.7] Out: -  Intake/Output this shift: Total I/O In: -  Out: 375 [Urine:375]  Gen: NAD, comfortable CV: RRR Pulm: Normal work of breathing Abd: Soft, NT/ND GU: L buttock with 3 necrotic areas and surrounding erythema Ext: SCDs in place  Circulating nurse served as chaperone  Lab Results: CBC  Recent Labs    06/26/18 2106 06/27/18 0416  WBC 16.1* 13.0*  HGB 10.7* 9.0*  HCT 32.6* 28.7*  PLT 143* 119*   BMET Recent Labs    06/26/18 2106 06/27/18 0416  NA 136 141  K 5.1 4.6  CL 99 107  CO2 18* 21*  GLUCOSE 112* 85  BUN 100* 96*  CREATININE 5.59* 5.04*  CALCIUM 8.5* 7.8*   PT/INR Recent Labs    06/26/18 2106  LABPROT 15.5*  INR 1.24   ABG No results for input(s): PHART, HCO3 in the last 72 hours.  Invalid input(s): PCO2, PO2  Studies/Results:  Anti-infectives: Anti-infectives (From admission, onward)   Start     Dose/Rate Route Frequency Ordered Stop   06/27/18 1215  vancomycin (VANCOCIN) 1,500 mg in sodium chloride 0.9 % 500 mL IVPB     1,500 mg 250 mL/hr over 120 Minutes Intravenous  Once 06/27/18 1142 06/27/18 1500   06/27/18 1100  [MAR Hold]  metroNIDAZOLE (FLAGYL) IVPB 500 mg     (MAR Hold since Wed 06/27/2018 at 1637. Reason: Transfer to a Procedural area.)   500 mg 100 mL/hr over 60 Minutes Intravenous Every 8 hours 06/27/18 1052     06/27/18 0200  [MAR  Hold]  cefTRIAXone (ROCEPHIN) 1 g in sodium chloride 0.9 % 100 mL IVPB     (MAR Hold since Wed 06/27/2018 at 1637. Reason: Transfer to a Procedural area.)   1 g 200 mL/hr over 30 Minutes Intravenous Every 24 hours 06/27/18 0138         Assessment/Plan: Patient Active Problem List   Diagnosis Date Noted  . Pressure injury of skin 06/27/2018  . Cellulitis and abscess of buttock   . Gastrointestinal hemorrhage   . Hypotension 06/26/2018  . ARF (acute renal failure) (HCC) 06/26/2018  . Black stool 06/26/2018  . Anemia 06/26/2018  . Bacteremia 03/07/2016  . Atypical chest pain 03/07/2016  . Acute-on-chronic kidney injury (HCC) 03/07/2016  . Essential hypertension 03/07/2016  . Chronic diastolic congestive heart failure (HCC) 03/07/2016  . Hypothyroidism 03/07/2016  . Depression with anxiety 03/07/2016  . UTI (lower urinary tract infection)   . Near syncope 08/20/2015  . Morbid obesity (HCC) 08/20/2015  . Pain in the chest 07/06/2015  . Awareness alteration, transient 05/07/2015  . History of stroke 05/07/2015   A/P [name] is a very pleasant 89yoF with potentially necrotizing soft tissue infection of left buttock  -The anatomy and physiology of the buttocks was discussed at length with the patient and her family. The pathophysiology of abscess  and necrotizing soft tissue infections was discussed at length as well -We discussed option for incision, drainage and debridement with probable placement of Penrose drains and/or packing. They opted to pursue this. With or without surgery these can prove life threatening. We discussed surgery as offering best potential success and the elected to proceed. We also discussed possibility of needing return trip to OR for additional debridement in coming days -The planned procedure, material risks (including, but not limited to, pain, bleeding, scarring, need for blood transfusion, damage to anal sphincter, incontinence of gas and/or stool, need for  additional procedures, recurrence, pneumonia, heart attack, stroke, death) benefits and alternatives to surgery were discussed at length. The patient's questions were answered to her satisfaction, she voiced understanding and elected to proceed with surgery. Additionally, we discussed typical postoperative expectations and the recovery process.  LOS: 1 day   [name], M.D. [facility]

## 2018-06-27 NOTE — Progress Notes (Addendum)
Shift event: Nice lady who was admitted for a buttock abscess. Lactate was elevated on admit, but then returned to normal. 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 Wound cx sent in surgery. She also has enteritis and GI has been consulted and appropriate antibiotics ordered. NP called by surgeon and told that pt has had persistent hypotension in PACU. She has received about 4500cc crystalloid and 2 doses of 12.5 Albumin. BP still in the 80s with MAP in the 50s-60.  At that point, NP ordered another 500cc bolus and TF to ICU because of likely need for pressors. NP spoke with Elink to give them a heads up on what is going on with pt.  S: Denies pain. + SOB, but states she is "always SOB" even at home. Uses inhaler at home. Doesn't know if she has an official dx of COPD. No n/v. Feels weak and dizzy.  O: Fairly well appearing elderly WF in NAD. BP 80s up to 94 but MAP still less than 65. S1S2 with PVCs. HR 80s. RR 16. SaO2 98% on O2. No respiratory distress. Mental status intact. Alert and oriented. Follows commands. Speech is clear and fluent. She has voided about 900cc.  A/P: 1. Persistent hypotension-likely volume depletion. Gave close to 6L of fluids. Once pt in ICU, [facility] P[facility]M ordered another albumin. Will start Neo with goal of MAP 65. They will see pt and should take over care given pressors. Stat H/H is pending. May need transfusion.  Lactate pending. BP meds have been held since admission.  2. necrotizing fascitis of buttocks and enteritis-per PCCM, antibiotics widened to Vanc, Zosyn, and Cleocin. Clear liquids per GI today. Surgeon to follow.  3. Acute renal failure-nephrology has seen. Appropriate labs done. Recheck BMP now. Fluids as above. On maintenance IVF at 100cc/hr after boluses.  4. PVCs-check BMP, Mag.  5. ?sepsis/SIRS-no temp, UO good, not tachycardic. O2 sat normal. Lactate pending. Urine cx neg.  Blood cxs-NGTD. + hypotension.  6. Code status-confirmed with pt she is a DNR/DNI.  Daughter and son updated at bedside.  Addendum: LA 3.0 Hgb 8.8. PCCM put in orders. Still on Neo. BP up in the 100s now. Creat trending down. Mg and K normal. As of 0545, PCCM has not seen the pt.   Total critical care time: 40 minutes Critical care time was exclusive of separately billable procedures and treating other patients. Critical care was necessary to treat or prevent imminent or life-threatening deterioration. Critical care was time spent personally by me on the following activities: development of treatment plan with patient and/or surrogate as well as nursing, discussions with consultants, evaluation of patient's response to treatment, examination of patient, obtaining history from patient or surrogate, ordering and performing treatments and interventions, ordering and review of laboratory studies, ordering and review of radiographic studies, pulse oximetry and re-evaluation of patient's condition. KJKG, NP [facility]

## 2018-06-27 NOTE — Progress Notes (Signed)
Patient's BP upon arrival to PACU 88/44 and 79/33 in left arm.  BP on right arm 76/45.  Dr. Chilton SiGreen notified and order received to give one 250ml of albumin.  Okay to give second albumin depending on BP response.  Will give one albumin and continue to monitor.

## 2018-06-27 NOTE — Consult Note (Signed)
[facility] Consult Note  [name] 10/08/1928  [id number].    Requesting MD: Danford Chief Complaint/Reason for Consult: L buttock abscess HPI:  Patient is a 84 year old female who presented to [facility] with L buttock pain and concern for abscess. Pain started about a week ago and has progressively worsened. Patient thought she just had a boil initially and then a second one a few days later. Has been applying desitin without improvement. Patient denies fevers/chills, but reports decreased PO intake, fatigue, decreased urine output. She also reports diarrhea and generalized abdominal cramping yesterday. Patient reports chronic cough related to medications. Allergic to gabapentin, sulfa abx, lyrica, and morphine. Patient reports the last time she had to be put to sleep she had some trouble waking up. Multiple family members present at bedside. Last food/drink cranberry juice 12:45 PM.   ROS: Review of Systems  Constitutional: Positive for malaise/fatigue. Negative for chills and fever.  Respiratory: Positive for cough (chronic).   Cardiovascular: Negative for chest pain and palpitations.  Gastrointestinal: Positive for abdominal pain, diarrhea and nausea. Negative for vomiting.  Genitourinary: Negative for dysuria, frequency and urgency.       Pain in left buttuck and labia. Wounds present to left buttock, tan drainage from superior one  Neurological: Positive for weakness.  All other systems reviewed and are negative.   Family History  Problem Relation Age of Onset  . Diabetes Mother   . Stroke Mother   . Cancer Brother   . Kidney disease Sister     Past Medical History:  Diagnosis Date  . Chest pain   . Depression   . GERD (gastroesophageal reflux disease)   . Hypertension   . Sleep apnea   . Thyroid disease     Past Surgical History:  Procedure Laterality Date  . APPENDECTOMY  1946  . CATARACT EXTRACTION     bilateral  . GALLBLADDER SURGERY    . KNEE  ARTHROPLASTY  2006   left   . KNEE ARTHROPLASTY  2006   right   . SHOULDER ARTHROSCOPY  2009   right  . TOTAL HIP ARTHROPLASTY  2002    left    Social History:  reports that she has quit smoking. She has never used smokeless tobacco. She reports that she does not drink alcohol or use drugs.  Allergies:  Allergies  Allergen Reactions  . Gabapentin Other (See Comments)    Alters mood  . Lyrica [Pregabalin] Other (See Comments)    Lethargic, mood changes  . Morphine And Related Hives and Itching  . Lipitor [Atorvastatin] Itching and Rash  . Sulfa Antibiotics Rash    Medications Prior to Admission  Medication Sig Dispense Refill  . allopurinol (ZYLOPRIM) 100 MG tablet Take 100 mg by mouth daily.     . calcium citrate-vitamin D (CITRACAL+D) 315-200 MG-UNIT per tablet Take 1 tablet by mouth daily.    . ciprofloxacin (CIPRO) 500 MG tablet Take 1 tablet (500 mg total) by mouth 2 (two) times daily. (Patient not taking: Reported on 06/26/2018) 16 tablet 0  . doxycycline (VIBRAMYCIN) 100 MG capsule Take 1 capsule (100 mg total) by mouth 2 (two) times daily. One po bid x 7 days (Patient not taking: Reported on 06/26/2018) 14 capsule 0  . furosemide (LASIX) 80 MG tablet Take 80 mg by mouth 2 (two) times daily.    Marland Kitchen guaiFENesin (MUCINEX) 600 MG 12 hr tablet Take 1 tablet (600 mg total) by mouth 2 (two) times daily as  needed for cough or to loosen phlegm. 45 tablet 0  . levothyroxine (SYNTHROID, LEVOTHROID) 137 MCG tablet Take 137 mcg by mouth daily before breakfast.    . LORazepam (ATIVAN) 1 MG tablet Take 1 mg by mouth at bedtime.    . Misc Natural Products (GLUCOSAMINE CHONDROITIN VIT D3) CAPS Take 1 capsule by mouth daily.     . Multiple Vitamin (MULTIVITAMIN WITH MINERALS) TABS tablet Take 1 tablet by mouth daily.    . nitroGLYCERIN (NITROSTAT) 0.4 MG SL tablet Place 0.4 mg under the tongue every 5 (five) minutes as needed for chest pain.    Marland Kitchen omeprazole (PRILOSEC) 40 MG capsule Take 40 mg  by mouth daily.    . sertraline (ZOLOFT) 50 MG tablet Take 50 mg by mouth daily.      Blood pressure (!) 98/44, pulse 73, temperature 97.8 F (36.6 C), temperature source Oral, resp. rate 19, height _0  (1.626 m), weight 101.8 kg (224 lb 6.9 oz), SpO2 93 %. Physical Exam: Physical Exam  Constitutional: She is oriented to person, place, and time. She appears well-developed and well-nourished. She is cooperative. She appears ill. No distress.  HENT:  Head: Normocephalic and atraumatic.  Right Ear: External ear normal.  Left Ear: External ear normal.  Nose: Nose normal.  Mouth/Throat: Mucous membranes are normal.  Eyes: Pupils are equal, round, and reactive to light. Conjunctivae, EOM and lids are normal. No scleral icterus.  Neck: Normal range of motion and phonation normal. Neck supple. No tracheal deviation present.  Cardiovascular: Normal rate and regular rhythm.  Pulses:      Radial pulses are 2+ on the right side, and 2+ on the left side.       Dorsalis pedis pulses are 2+ on the right side, and 2+ on the left side.  Pulmonary/Chest: Effort normal and breath sounds normal.  Abdominal: Soft. Bowel sounds are normal. She exhibits no distension. There is generalized tenderness. There is no rigidity, no rebound and no guarding.  Genitourinary: Pelvic exam was performed with patient in the knee-chest position. There is tenderness (with erythema and induration ) on the left labia.  Genitourinary Comments: See photo below. L buttock with induration and erythema tracking up to L labia. 3 lesions of necrosis centrally, thin tan drainage expressed, no crepitus.   Musculoskeletal:  ROM grossly intact in bilateral upper and lower extremities. No gross deformities.   Neurological: She is alert and oriented to person, place, and time.  Skin: Skin is warm and dry.  Psychiatric: She has a normal mood and affect. Her speech is normal and behavior is normal.   L gluteal wound     Results for  orders placed or performed during the hospital encounter of 06/26/18 (from the past 48 hour(s))  Type and screen [place]     Status: None   Collection Time: 06/26/18  9:03 PM  Result Value Ref Range   ABO/RH(D) B POS    Antibody Screen NEG    Sample Expiration      06/29/2018 Performed at [facility], [place] [address], [place], [place] [address]   ABO/Rh     Status: None   Collection Time: 06/26/18  9:03 PM  Result Value Ref Range   ABO/RH(D)      B POS Performed at [facility] [address] [place], [place] [address]   Comprehensive metabolic panel     Status: Abnormal   Collection Time: 06/26/18  9:06 PM  Result Value Ref Range   Sodium 136 135 - 145 mmol/L   Potassium 5.1 3.5 - 5.1 mmol/L   Chloride 99 98 - 111 mmol/L   CO2 18 (L) 22 - 32 mmol/L   Glucose, Bld 112 (H) 70 - 99 mg/dL   BUN 100 (H) 8 - 23 mg/dL   Creatinine, Ser 5.59 (H) 0.44 - 1.00 mg/dL   Calcium 8.5 (L) 8.9 - 10.3 mg/dL   Total Protein 6.1 (L) 6.5 - 8.1 g/dL   Albumin 2.7 (L) 3.5 - 5.0 g/dL   AST 22 15 - 41 U/L   ALT 13 0 - 44 U/L   Alkaline Phosphatase 131 (H) 38 - 126 U/L   Total Bilirubin 1.6 (H) 0.3 - 1.2 mg/dL   GFR calc non Af Amer 6 (L) >60 mL/min   GFR calc Af Amer 7 (L) >60 mL/min    Comment: (NOTE) The eGFR has been calculated using the CKD EPI equation. This calculation has not been validated in all clinical situations. eGFR's persistently <60 mL/min signify possible Chronic Kidney Disease.    Anion gap 19 (H) 5 - 15    Comment: Performed at [facility], [place] [address] [place] [address], [place], [place] [address]  CBC     Status: Abnormal   Collection Time: 06/26/18  9:06 PM  Result Value Ref Range   WBC 16.1 (H) 4.0 - 10.5 K/uL   RBC 3.49 (L) 3.87 - 5.11 MIL/uL   Hemoglobin 10.7 (L) 12.0 - 15.0 g/dL   HCT 32.6 (L) 36.0 - 46.0 %   MCV 93.4 78.0 - 100.0 fL   MCH 30.7 26.0 - 34.0 pg   MCHC 32.8 30.0 - 36.0 g/dL   RDW 14.5 11.5 - 15.5 %   Platelets 143 (L)  150 - 400 K/uL    Comment: Performed at [facility], [place] [address] [place], [place] [address]  Protime-INR - (order if Patient is taking Coumadin / Warfarin)     Status: Abnormal   Collection Time: 06/26/18  9:06 PM  Result Value Ref Range   Prothrombin Time 15.5 (H) 11.4 - 15.2 seconds   INR 1.24     Comment: Performed at [facility] [address], [place], [place] [address]  POC occult blood, ED     Status: Abnormal   Collection Time: 06/26/18  9:33 PM  Result Value Ref Range   Fecal Occult Bld POSITIVE (A) NEGATIVE  I-stat troponin, ED     Status: None   Collection Time: 06/26/18  9:38 PM  Result Value Ref Range   Troponin i, poc 0.04 0.00 - 0.08 ng/mL   Comment 3            Comment: Due to the release kinetics of cTnI, a negative result within the first hours of the onset of symptoms does not rule out myocardial infarction with certainty. If myocardial infarction is still suspected, repeat the test at appropriate intervals.   I-Stat CG4 Lactic Acid, ED     Status: Abnormal   Collection Time: 06/26/18  9:40 PM  Result Value Ref Range   Lactic Acid, Venous 2.96 (HH) 0.5 - 1.9 mmol/L   Comment NOTIFIED PHYSICIAN   I-Stat CG4 Lactic Acid, ED     Status: None   Collection Time: 06/26/18 11:32 PM  Result Value Ref Range   Lactic Acid, Venous 1.87 0.5 - 1.9 mmol/L  Troponin I (q 6hr x 3)     Status: None  Collection Time: 06/27/18 12:03 AM  Result Value Ref Range   Troponin I <0.03 <0.03 ng/mL    Comment: Performed at [facility], [place] [address] [place], [place] [address]  Magnesium     Status: None   Collection Time: 06/27/18 12:03 AM  Result Value Ref Range   Magnesium 2.1 1.7 - 2.4 mg/dL    Comment: Performed at [facility], [place] [address] [place], [place] [address]  Urinalysis, Routine w reflex microscopic     Status: Abnormal   Collection Time: 06/27/18 12:43 AM  Result Value Ref Range   Color, Urine YELLOW YELLOW   APPearance HAZY  (A) CLEAR   Specific Gravity, Urine 1.012 1.005 - 1.030   pH 5.0 5.0 - 8.0   Glucose, UA NEGATIVE NEGATIVE mg/dL   Hgb urine dipstick NEGATIVE NEGATIVE   Bilirubin Urine NEGATIVE NEGATIVE   Ketones, ur NEGATIVE NEGATIVE mg/dL   Protein, ur NEGATIVE NEGATIVE mg/dL   Nitrite NEGATIVE NEGATIVE   Leukocytes, UA SMALL (A) NEGATIVE   RBC / HPF 0-5 0 - 5 RBC/hpf   WBC, UA 0-5 0 - 5 WBC/hpf   Bacteria, UA RARE (A) NONE SEEN   Squamous Epithelial / LPF 0-5 0 - 5   Mucus PRESENT    Hyaline Casts, UA PRESENT     Comment: Performed at [facility], [place] [address] [place], [place] [address]  Sodium, urine, random     Status: None   Collection Time: 06/27/18 12:43 AM  Result Value Ref Range   Sodium, Ur <10 mmol/L    Comment: Performed at [facility] [address] [place], [place] [address]  Creatinine, urine, random     Status: None   Collection Time: 06/27/18 12:43 AM  Result Value Ref Range   Creatinine, Urine 81.71 mg/dL    Comment: Performed at [facility] [address] [place], [place] [address]  MRSA PCR Screening     Status: None   Collection Time: 06/27/18  3:16 AM  Result Value Ref Range   MRSA by PCR NEGATIVE NEGATIVE    Comment:        The GeneXpert MRSA Assay (FDA approved for NASAL specimens only), is one component of a comprehensive MRSA colonization surveillance program. It is not intended to diagnose MRSA infection nor to guide or monitor treatment for MRSA infections. Performed at [facility], [place] [address], [place], [place] [address]   Troponin I (q 6hr x 3)     Status: None   Collection Time: 06/27/18  4:16 AM  Result Value Ref Range   Troponin I <0.03 <0.03 ng/mL    Comment: Performed at [facility] [address] [place], [place] [address]  Cortisol     Status: None   Collection Time: 06/27/18  4:16 AM  Result Value Ref Range   Cortisol, Plasma 37.5 ug/dL    Comment: (NOTE) AM    6.7 - 22.6 ug/dL PM   <10.0        ug/dL Performed at [facility] [address], [place], [place] [address]   Comprehensive metabolic panel     Status: Abnormal   Collection Time: 06/27/18  4:16 AM  Result Value Ref Range   Sodium 141 135 - 145 mmol/L   Potassium 4.6 3.5 - 5.1 mmol/L   Chloride 107 98 - 111 mmol/L   CO2 21 (L) 22 - 32 mmol/L  Glucose, Bld 85 70 - 99 mg/dL   BUN 96 (H) 8 - 23 mg/dL   Creatinine, Ser 5.04 (H) 0.44 - 1.00 mg/dL   Calcium 7.8 (L) 8.9 - 10.3 mg/dL   Total Protein 5.3 (L) 6.5 - 8.1 g/dL   Albumin 2.2 (L) 3.5 - 5.0 g/dL   AST 19 15 - 41 U/L   ALT 12 0 - 44 U/L   Alkaline Phosphatase 101 38 - 126 U/L   Total Bilirubin 1.3 (H) 0.3 - 1.2 mg/dL   GFR calc non Af Amer 7 (L) >60 mL/min   GFR calc Af Amer 8 (L) >60 mL/min    Comment: (NOTE) The eGFR has been calculated using the CKD EPI equation. This calculation has not been validated in all clinical situations. eGFR's persistently <60 mL/min signify possible Chronic Kidney Disease.    Anion gap 13 5 - 15    Comment: Performed at [facility] [address] [place], [place] [address]  CBC     Status: Abnormal   Collection Time: 06/27/18  4:16 AM  Result Value Ref Range   WBC 13.0 (H) 4.0 - 10.5 K/uL   RBC 3.02 (L) 3.87 - 5.11 MIL/uL   Hemoglobin 9.0 (L) 12.0 - 15.0 g/dL   HCT 28.7 (L) 36.0 - 46.0 %   MCV 95.0 78.0 - 100.0 fL   MCH 29.8 26.0 - 34.0 pg   MCHC 31.4 30.0 - 36.0 g/dL   RDW 14.7 11.5 - 15.5 %   Platelets 119 (L) 150 - 400 K/uL    Comment: PLATELET COUNT CONFIRMED BY SMEAR Performed at [facility] [address], [place], [place] [address]   Troponin I (q 6hr x 3)     Status: Abnormal   Collection Time: 06/27/18 10:39 AM  Result Value Ref Range   Troponin I 0.03 (HH) <0.03 ng/mL    Comment: CRITICAL RESULT CALLED TO, READ BACK BY AND VERIFIED WITH: C HUTCHINS RN [id number] 06/27/2018 BY A BENNETT Performed at [facility], [place] [address] [place], [place] [address]    Ct Abdomen Pelvis Wo  Contrast  Result Date: 06/27/2018 CLINICAL DATA:  Initial evaluation for acute abdominal pain. EXAM: CT ABDOMEN AND PELVIS WITHOUT CONTRAST TECHNIQUE: Multidetector CT imaging of the abdomen and pelvis was performed following the standard protocol without IV contrast. COMPARISON:  Prior CT from 03/16/2011. FINDINGS: Lower chest: Scattered subsegmental atelectatic changes present within the visualized lung bases. 7 mm nodular density at the right lung base (series 4, image 2), slightly more prominent as compared to previous exam. Cardiomegaly partially visualized. Hepatobiliary: Limited noncontrast evaluation of the liver is unremarkable. Gallbladder surgically absent. No biliary dilatation. Pancreas: Pancreas somewhat atrophic in appearance. No acute peripancreatic inflammation. Spleen: Spleen within normal limits. Adrenals/Urinary Tract: Left adrenal adenoma noted, stable. Asymmetric atrophy of the left kidney is compared to the right, similar to previous. No nephrolithiasis or hydronephrosis. No hydroureter. Bladder largely decompressed with a Foley catheter in place. Gas lucency within the bladder lumen consistent with catheterization. Stomach/Bowel: Small hiatal hernia. Stomach otherwise unremarkable. Hazy inflammatory stranding about a loop of small bowel within the left abdomen (series 3, image 44), suggesting possible acute enteritis. Small bowel of normal caliber without obstruction. Colon diffusely decompressed. Extensive sigmoid diverticulosis without evidence for acute diverticulitis. Vascular/Lymphatic: Moderate to advanced aorto bi-iliac atherosclerotic disease. No aneurysm. No pathologically enlarged intra-abdominal or pelvic lymph nodes. Shotty subcentimeter mesenteric lymph nodes noted within the area of inflammatory stranding  within the mid abdomen, which may be reactive. Reproductive: Uterus within normal limits. Calcification noted at the left ovary. 2.5 cm simple right adnexal cyst, most likely  benign given size and appearance. Other: No free air or fluid. Small fat containing paraumbilical hernia without associated inflammation. Musculoskeletal: Asymmetric hazy stranding within the partially visualized subcutaneous fat of the left buttock (series 3, image 91). No loculated collections. Left hip arthroplasty in place. Degenerative changes at the right hip. No acute osseus abnormality. No worrisome lytic or blastic osseous lesions. Advanced degenerative spondylolysis and facet arthrosis throughout the visualized spine. IMPRESSION: 1. Focal hazy inflammatory stranding adjacent to a loop of small bowel within the left abdomen, suspicious for acute enteritis. No associated obstruction or other complication identified. 2. Asymmetric hazy inflammatory stranding within the subcutaneous fat of the left buttock, nonspecific, but could reflect sequelae of acute cellulitis. Correlation with physical exam recommended. No discrete collections identified. 3. Sigmoid diverticulosis without evidence for acute diverticulitis. 4. Cardiomegaly with advanced aorto bi-iliac atherosclerotic disease. 5. 7 mm nodule at the right lung base, increased in prominence as compared to 2012. Non-contrast chest CT at 6-12 months is recommended. If the nodule is stable at time of repeat CT, then future CT at 18-24 months (from today's scan) is considered optional for low-risk patients, but is recommended for high-risk patients. This recommendation follows the consensus statement: Guidelines for Management of Incidental Pulmonary Nodules Detected on CT Images: From the Fleischner Society 2017; Radiology 2017; 284:228-243. Electronically Signed   By: Jeannine Boga M.D.   On: 06/27/2018 02:06   Dg Chest Port 1 View  Result Date: 06/27/2018 CLINICAL DATA:  Cough tonight. EXAM: PORTABLE CHEST 1 VIEW COMPARISON:  Radiograph 05/16/2016 FINDINGS: The heart is enlarged. Indistinct perihilar pulmonary vascularity. No focal airspace  disease. No pleural effusion or pneumothorax. No acute osseous abnormalities are seen. IMPRESSION: Cardiomegaly. Indistinct perihilar pulmonary vasculature suggesting vascular congestion and or edema. No pneumonia. Electronically Signed   By: Jeb Levering M.D.   On: 06/27/2018 01:50      Assessment/Plan AKI on CKD stage III - Cr 5.04, per primary service Chronic normocytic anemia - H/H 9.0/28.7, per primary service Enteritis - nonspecific finding on CT Hypothyroidism - home meds HTN - holding home meds for hypoTN Obesity - BMI 38.52 OSA  Sepsis - likely due to below L buttock abscess - WBC 13.0, afebrile - BP soft but improving with IVF and IV abx - concern for potential nec fasc or fournier's - make npo and plan for I&D in OR this evening   FEN: NPO, IVF VTE: SCDs ID: [id number] x1 7/31; rocephin/flagyl 7/31>>  Brigid Re, [facility] 06/27/2018, 12:23 PM Pager: [phone] Consults: [phone] Mon-Fri 7:00 am-4:30 pm Sat-Sun 7:00 am-11:30 am

## 2018-06-27 NOTE — Progress Notes (Signed)
CRITICAL VALUE ALERT  Critical Value: Troponin 0.03  Date & Time Notied: 06/27/18  1200  Provider Notified: Danford   Orders Received/Actions taken:

## 2018-06-27 NOTE — Transfer of Care (Signed)
Immediate Anesthesia Transfer of Care Note  Patient: [name]  Procedure(s) Performed: IRRIGATION AND DEBRIDEMENT BUTTOCK ABSCESS (Left )  Patient Location: PACU  Anesthesia Type:General  Level of Consciousness: awake  Airway & Oxygen Therapy: Patient Spontanous Breathing  Post-op Assessment: Report given to RN and Post -op Vital signs reviewed and stable  Post vital signs: Reviewed and stable  Last Vitals:  Vitals Value Taken Time  BP 88/44 06/27/2018  7:02 PM  Temp    Pulse 88 06/27/2018  7:03 PM  Resp 21 06/27/2018  7:03 PM  SpO2 98 % 06/27/2018  7:03 PM  Vitals shown include unvalidated device data.  Last Pain:  Vitals:   06/27/18 0803  TempSrc: Oral  PainSc:          Complications: No apparent anesthesia complications

## 2018-06-28 ENCOUNTER — Encounter (HOSPITAL_COMMUNITY): Payer: Self-pay | Admitting: Surgery

## 2018-06-28 DIAGNOSIS — N179 Acute kidney failure, unspecified: Secondary | ICD-10-CM

## 2018-06-28 DIAGNOSIS — I9589 Other hypotension: Secondary | ICD-10-CM

## 2018-06-28 DIAGNOSIS — E861 Hypovolemia: Secondary | ICD-10-CM

## 2018-06-28 LAB — CBC
HCT: 29 % — ABNORMAL LOW (ref 36.0–46.0)
HEMATOCRIT: 25 % — AB (ref 36.0–46.0)
HEMOGLOBIN: 8.8 g/dL — AB (ref 12.0–15.0)
Hemoglobin: 8 g/dL — ABNORMAL LOW (ref 12.0–15.0)
MCH: 30.1 pg (ref 26.0–34.0)
MCH: 30.6 pg (ref 26.0–34.0)
MCHC: 30.3 g/dL (ref 30.0–36.0)
MCHC: 32 g/dL (ref 30.0–36.0)
MCV: 100.7 fL — ABNORMAL HIGH (ref 78.0–100.0)
MCV: 94 fL (ref 78.0–100.0)
PLATELETS: 104 10*3/uL — AB (ref 150–400)
Platelets: 107 10*3/uL — ABNORMAL LOW (ref 150–400)
RBC: 2.66 MIL/uL — AB (ref 3.87–5.11)
RBC: 2.88 MIL/uL — AB (ref 3.87–5.11)
RDW: 15 % (ref 11.5–15.5)
RDW: 15.3 % (ref 11.5–15.5)
WBC: 8.2 10*3/uL (ref 4.0–10.5)
WBC: 8.7 10*3/uL (ref 4.0–10.5)

## 2018-06-28 LAB — BASIC METABOLIC PANEL
Anion gap: 15 (ref 5–15)
BUN: 94 mg/dL — ABNORMAL HIGH (ref 8–23)
CHLORIDE: 112 mmol/L — AB (ref 98–111)
CO2: 16 mmol/L — ABNORMAL LOW (ref 22–32)
CREATININE: 3.82 mg/dL — AB (ref 0.44–1.00)
Calcium: 7.5 mg/dL — ABNORMAL LOW (ref 8.9–10.3)
GFR calc Af Amer: 11 mL/min — ABNORMAL LOW (ref >60)
GFR calc non Af Amer: 10 mL/min — ABNORMAL LOW (ref >60)
Glucose, Bld: 103 mg/dL — ABNORMAL HIGH (ref 70–99)
POTASSIUM: 4.3 mmol/L (ref 3.5–5.1)
SODIUM: 143 mmol/L (ref 135–145)

## 2018-06-28 LAB — LACTIC ACID, PLASMA
LACTIC ACID, VENOUS: 3 mmol/L — AB (ref 0.5–1.9)
Lactic Acid, Venous: 2.5 mmol/L (ref 0.5–1.9)

## 2018-06-28 LAB — MAGNESIUM: Magnesium: 2 mg/dL (ref 1.7–2.4)

## 2018-06-28 LAB — PROCALCITONIN: PROCALCITONIN: 9.17 ng/mL

## 2018-06-28 MED ORDER — SODIUM CHLORIDE 0.9 % IV SOLN
INTRAVENOUS | Status: DC
  Administered 2018-06-28: 07:00:00 via INTRAVENOUS

## 2018-06-28 MED ORDER — VANCOMYCIN VARIABLE DOSE PER UNSTABLE RENAL FUNCTION (PHARMACIST DOSING)
Status: DC
Start: 2018-06-28 — End: 2018-06-29

## 2018-06-28 MED ORDER — ALBUMIN HUMAN 5 % IV SOLN
12.5000 g | Freq: Once | INTRAVENOUS | Status: AC
  Administered 2018-06-28: 12.5 g via INTRAVENOUS
  Filled 2018-06-28: qty 250

## 2018-06-28 MED ORDER — FENTANYL CITRATE (PF) 100 MCG/2ML IJ SOLN
25.0000 ug | INTRAMUSCULAR | Status: DC | PRN
  Administered 2018-06-28 – 2018-07-11 (×23): 25 ug via INTRAVENOUS
  Filled 2018-06-28 (×23): qty 2

## 2018-06-28 MED ORDER — SODIUM CHLORIDE 0.9 % IV SOLN: INTRAVENOUS | Status: DC

## 2018-06-28 NOTE — Progress Notes (Signed)
[facility] Progress Note  1 Day Post-Op  Subjective: CC: abdominal cramping Pt complaining mostly of crampy pain in lower abdomen. Not having much pain in L buttocks any longer.   Objective: Vital signs in last 24 hours: Temp:  [97.3 F (36.3 C)-98.1 F (36.7 C)] 97.6 F (36.4 C) (08/01 0405) Pulse Rate:  [29-90] 29 (08/01 0700) Resp:  [13-32] 30 (08/01 0700) BP: (76-145)/(31-112) 106/52 (08/01 0700) SpO2:  [94 %-100 %] 99 % (08/01 0700) Weight:  [104 kg (229 lb 4.5 oz)-104.8 kg (231 lb 0.7 oz)] 104 kg (229 lb 4.5 oz) (08/01 0500) Last BM Date: 06/26/18  Intake/Output from previous day: 07/31 0701 - 08/01 0700 In: 5579.5 [I.V.:2317.4; IV Piggyback:3262.1] Out: 1585 [Urine:1485; Blood:100] Intake/Output this shift: No intake/output data recorded.  PE: Gen:  Alert, NAD, pleasant Card:  Regular rate and rhythm, pedal pulses 2+ BL Pulm:  Normal effort, clear to auscultation bilaterally Abd: Soft, mildly tender suprapubic, non-distended, bowel sounds present GU: L buttock wound as below with necrotic tissue at wound edges, very close to anus, surrounding erythema present, induration improved  Skin: warm and dry, no rashes  Psych: A&Ox3   Lab Results:  Recent Labs    06/27/18 1624 06/27/18 2312 06/27/18 2332  WBC 8.0  --  8.2  HGB 9.5* 8.1* 8.8*  HCT 28.7* 25.2* 29.0*  PLT 124*  --  107*   BMET Recent Labs    06/27/18 0416 06/27/18 2332  NA 141 143  K 4.6 4.3  CL 107 112*  CO2 21* 16*  GLUCOSE 85 103*  BUN 96* 94*  CREATININE 5.04* 3.82*  CALCIUM 7.8* 7.5*   PT/INR Recent Labs    06/26/18 2106  LABPROT 15.5*  INR 1.24   CMP     Component Value Date/Time   NA 143 06/27/2018 2332   K 4.3 06/27/2018 2332   CL 112 (H) 06/27/2018 2332   CO2 16 (L) 06/27/2018 2332   GLUCOSE 103 (H) 06/27/2018 2332   BUN 94 (H) 06/27/2018 2332   CREATININE 3.82 (H) 06/27/2018 2332   CALCIUM 7.5 (L) 06/27/2018 2332   PROT 5.3 (L) 06/27/2018 0416   ALBUMIN 2.2 (L) 06/27/2018 0416   AST 19 06/27/2018 0416   ALT 12 06/27/2018 0416   ALKPHOS 101 06/27/2018 0416   BILITOT 1.3 (H) 06/27/2018 0416   GFRNONAA 10 (L) 06/27/2018 2332   GFRAA 11 (L) 06/27/2018 2332   Lipase  No results found for: LIPASE     Studies/Results: Ct Abdomen Pelvis Wo Contrast  Result Date: 06/27/2018 CLINICAL DATA:  Initial evaluation for acute abdominal pain. EXAM: CT ABDOMEN AND PELVIS WITHOUT CONTRAST TECHNIQUE: Multidetector CT imaging of the abdomen and pelvis was performed following the standard protocol without IV contrast. COMPARISON:  Prior CT from 03/16/2011. FINDINGS: Lower chest: Scattered subsegmental atelectatic changes present within the visualized lung bases. 7 mm nodular density at the right lung base (series 4, image 2), slightly more prominent as compared to previous exam. Cardiomegaly partially visualized. Hepatobiliary: Limited noncontrast evaluation of the liver is unremarkable. Gallbladder surgically absent. No biliary dilatation. Pancreas: Pancreas somewhat atrophic in appearance. No acute peripancreatic inflammation. Spleen: Spleen within normal limits. Adrenals/Urinary Tract: Left adrenal adenoma noted, stable. Asymmetric atrophy of the left kidney is compared to the right, similar to previous. No nephrolithiasis or hydronephrosis. No hydroureter. Bladder largely decompressed with a Foley catheter in place. Gas lucency within the bladder lumen consistent with catheterization. Stomach/Bowel: Small hiatal hernia. Stomach otherwise unremarkable. Hazy inflammatory  stranding about a loop of small bowel within the left abdomen (series 3, image 44), suggesting possible acute enteritis. Small bowel of normal caliber without obstruction. Colon diffusely decompressed. Extensive sigmoid diverticulosis without evidence for acute diverticulitis. Vascular/Lymphatic: Moderate to advanced aorto bi-iliac atherosclerotic disease. No aneurysm. No pathologically  enlarged intra-abdominal or pelvic lymph nodes. Shotty subcentimeter mesenteric lymph nodes noted within the area of inflammatory stranding within the mid abdomen, which may be reactive. Reproductive: Uterus within normal limits. Calcification noted at the left ovary. 2.5 cm simple right adnexal cyst, most likely benign given size and appearance. Other: No free air or fluid. Small fat containing paraumbilical hernia without associated inflammation. Musculoskeletal: Asymmetric hazy stranding within the partially visualized subcutaneous fat of the left buttock (series 3, image 91). No loculated collections. Left hip arthroplasty in place. Degenerative changes at the right hip. No acute osseus abnormality. No worrisome lytic or blastic osseous lesions. Advanced degenerative spondylolysis and facet arthrosis throughout the visualized spine. IMPRESSION: 1. Focal hazy inflammatory stranding adjacent to a loop of small bowel within the left abdomen, suspicious for acute enteritis. No associated obstruction or other complication identified. 2. Asymmetric hazy inflammatory stranding within the subcutaneous fat of the left buttock, nonspecific, but could reflect sequelae of acute cellulitis. Correlation with physical exam recommended. No discrete collections identified. 3. Sigmoid diverticulosis without evidence for acute diverticulitis. 4. Cardiomegaly with advanced aorto bi-iliac atherosclerotic disease. 5. 7 mm nodule at the right lung base, increased in prominence as compared to 2012. Non-contrast chest CT at 6-12 months is recommended. If the nodule is stable at time of repeat CT, then future CT at 18-24 months (from today's scan) is considered optional for low-risk patients, but is recommended for high-risk patients. This recommendation follows the consensus statement: Guidelines for Management of Incidental Pulmonary Nodules Detected on CT Images: From the Fleischner Society 2017; Radiology 2017; 284:228-243.  Electronically Signed   By: Rise Mu M.D.   On: 06/27/2018 02:06   Dg Chest Port 1 View  Result Date: 06/27/2018 CLINICAL DATA:  Cough tonight. EXAM: PORTABLE CHEST 1 VIEW COMPARISON:  Radiograph 05/16/2016 FINDINGS: The heart is enlarged. Indistinct perihilar pulmonary vascularity. No focal airspace disease. No pleural effusion or pneumothorax. No acute osseous abnormalities are seen. IMPRESSION: Cardiomegaly. Indistinct perihilar pulmonary vasculature suggesting vascular congestion and or edema. No pneumonia. Electronically Signed   By: Rubye Oaks M.D.   On: 06/27/2018 01:50    Anti-infectives: Anti-infectives (From admission, onward)   Start     Dose/Rate Route Frequency Ordered Stop   06/27/18 2345  piperacillin-tazobactam (ZOSYN) IVPB 2.25 g     2.25 g 100 mL/hr over 30 Minutes Intravenous Every 8 hours 06/27/18 2336     06/27/18 2315  vancomycin (VANCOCIN) IVPB 750 mg/150 ml premix  Status:  Discontinued    Note to Pharmacy:  Pharmacy to dose and monitor levels as appropriate for necrotizing fascitis   750 mg 150 mL/hr over 60 Minutes Intravenous Every 12 hours 06/27/18 2303 06/27/18 2329   06/27/18 2315  clindamycin (CLEOCIN) IVPB 600 mg     600 mg 100 mL/hr over 30 Minutes Intravenous Every 8 hours 06/27/18 2309     06/27/18 1215  vancomycin (VANCOCIN) 1,500 mg in sodium chloride 0.9 % 500 mL IVPB     1,500 mg 250 mL/hr over 120 Minutes Intravenous  Once 06/27/18 1142 06/27/18 1500   06/27/18 1100  metroNIDAZOLE (FLAGYL) IVPB 500 mg  Status:  Discontinued     500 mg 100 mL/hr over  60 Minutes Intravenous Every 8 hours 06/27/18 1052 06/27/18 2303   06/27/18 0200  cefTRIAXone (ROCEPHIN) 1 g in sodium chloride 0.9 % 100 mL IVPB  Status:  Discontinued     1 g 200 mL/hr over 30 Minutes Intravenous Every 24 hours 06/27/18 0138 06/27/18 2258       Assessment/Plan AKI on CKD stage III - Cr 3.82, improving Acute on Chronic normocytic anemia - H/H 8.8/29, per primary  service Enteritis - nonspecific finding on CT Hypothyroidism - home meds HTN - holding home meds for hypoTN Obesity - BMI 38.52 OSA  Sepsis - likely due to below - BP soft, on neo L buttock Fournier's Gangrene - WBC 8.2, afebrile, lactic trending down  - cxs pending, stain shows G+ cocci and G- coccobacilli - continue broad spectrum IV abx - patient is incontinent - will likely need diverting colostomy, either SP tube or foley chronically  - I think will likely need to return to the OR either today or tomorrow - may at some point also benefit from hydrotherapy +/- a few days of Dakin's as well - BID dressing changes with prn changes when soiled.   FEN: CLD, IVF VTE: SCDs ID: vanc x1 7/31; rocephin/flagyl/cleocin 7/31>> Foley: present    LOS: 2 days    Wells Guiles , [facility] 06/28/2018, 8:12 AM Pager: [phone] Consults: [phone] Mon-Fri 7:00 am-4:30 pm Sat-Sun 7:00 am-11:30 am

## 2018-06-28 NOTE — Progress Notes (Signed)
[facility] PROGRESS NOTE Subjective Patient was seen for somewhat abnormal CT suggesting enteritis.  She had left buttock cellulitis and perirectal abscess which was drained by Dr. Cliffton AstersWhite yesterday.  She states that she has been somewhat constipated but feels much much better.  Objective: Vital signs in last 24 hours: Temp:  [97.3 F (36.3 C)-98.1 F (36.7 C)] 98 F (36.7 C) (08/01 1216) Pulse Rate:  [29-90] 73 (08/01 1200) Resp:  [13-39] 32 (08/01 1200) BP: (76-145)/(31-112) 102/55 (08/01 1200) SpO2:  [94 %-100 %] 100 % (08/01 1200) Weight:  [104 kg (229 lb 4.5 oz)-104.8 kg (231 lb 0.7 oz)] 104 kg (229 lb 4.5 oz) (08/01 0500) Last BM Date: 06/26/18  Intake/Output from previous day: 07/31 0701 - 08/01 0700 In: 5579.5 [I.V.:2317.4; IV Piggyback:3262.1] Out: 1585 [Urine:1485; Blood:100] Intake/Output this shift: Total I/O In: 1090.3 [P.O.:240; I.V.:800.3; IV Piggyback:50] Out: 320 [Urine:320]   Lab Results: Recent Labs    06/26/18 2106 06/27/18 0416 06/27/18 1624 06/27/18 2312 06/27/18 2332  WBC 16.1* 13.0* 8.0  --  8.2  HGB 10.7* 9.0* 9.5* 8.1* 8.8*  HCT 32.6* 28.7* 28.7* 25.2* 29.0*  PLT 143* 119* 124*  --  107*   BMET Recent Labs    06/26/18 2106 06/27/18 0416 06/27/18 2332  NA 136 141 143  K 5.1 4.6 4.3  CL 99 107 112*  CO2 18* 21* 16*  CREATININE 5.59* 5.04* 3.82*   LFT Recent Labs    06/26/18 2106 06/27/18 0416  PROT 6.1* 5.3*  AST 22 19  ALT 13 12  ALKPHOS 131* 101  BILITOT 1.6* 1.3*   PT/INR Recent Labs    06/26/18 2106  LABPROT 15.5*  INR 1.24   PANCREAS No results for input(s): LIPASE in the last 72 hours.       Studies/Results: Ct Abdomen Pelvis Wo Contrast  Result Date: 06/27/2018 CLINICAL DATA:  Initial evaluation for acute abdominal pain. EXAM: CT ABDOMEN AND PELVIS WITHOUT CONTRAST TECHNIQUE: Multidetector CT imaging of the abdomen and pelvis was performed following the standard protocol without IV contrast.  COMPARISON:  Prior CT from 03/16/2011. FINDINGS: Lower chest: Scattered subsegmental atelectatic changes present within the visualized lung bases. 7 mm nodular density at the right lung base (series 4, image 2), slightly more prominent as compared to previous exam. Cardiomegaly partially visualized. Hepatobiliary: Limited noncontrast evaluation of the liver is unremarkable. Gallbladder surgically absent. No biliary dilatation. Pancreas: Pancreas somewhat atrophic in appearance. No acute peripancreatic inflammation. Spleen: Spleen within normal limits. Adrenals/Urinary Tract: Left adrenal adenoma noted, stable. Asymmetric atrophy of the left kidney is compared to the right, similar to previous. No nephrolithiasis or hydronephrosis. No hydroureter. Bladder largely decompressed with a Foley catheter in place. Gas lucency within the bladder lumen consistent with catheterization. Stomach/Bowel: Small hiatal hernia. Stomach otherwise unremarkable. Hazy inflammatory stranding about a loop of small bowel within the left abdomen (series 3, image 44), suggesting possible acute enteritis. Small bowel of normal caliber without obstruction. Colon diffusely decompressed. Extensive sigmoid diverticulosis without evidence for acute diverticulitis. Vascular/Lymphatic: Moderate to advanced aorto bi-iliac atherosclerotic disease. No aneurysm. No pathologically enlarged intra-abdominal or pelvic lymph nodes. Shotty subcentimeter mesenteric lymph nodes noted within the area of inflammatory stranding within the mid abdomen, which may be reactive. Reproductive: Uterus within normal limits. Calcification noted at the left ovary. 2.5 cm simple right adnexal cyst, most likely benign given size and appearance. Other: No free air or fluid. Small fat containing paraumbilical hernia without associated inflammation. Musculoskeletal: Asymmetric hazy stranding within the  partially visualized subcutaneous fat of the left buttock (series 3, image  91). No loculated collections. Left hip arthroplasty in place. Degenerative changes at the right hip. No acute osseus abnormality. No worrisome lytic or blastic osseous lesions. Advanced degenerative spondylolysis and facet arthrosis throughout the visualized spine. IMPRESSION: 1. Focal hazy inflammatory stranding adjacent to a loop of small bowel within the left abdomen, suspicious for acute enteritis. No associated obstruction or other complication identified. 2. Asymmetric hazy inflammatory stranding within the subcutaneous fat of the left buttock, nonspecific, but could reflect sequelae of acute cellulitis. Correlation with physical exam recommended. No discrete collections identified. 3. Sigmoid diverticulosis without evidence for acute diverticulitis. 4. Cardiomegaly with advanced aorto bi-iliac atherosclerotic disease. 5. 7 mm nodule at the right lung base, increased in prominence as compared to 2012. Non-contrast chest CT at 6-12 months is recommended. If the nodule is stable at time of repeat CT, then future CT at 18-24 months (from today's scan) is considered optional for low-risk patients, but is recommended for high-risk patients. This recommendation follows the consensus statement: Guidelines for Management of Incidental Pulmonary Nodules Detected on CT Images: From the Fleischner Society 2017; Radiology 2017; 284:228-243. Electronically Signed   By: Rise Mu M.D.   On: 06/27/2018 02:06   Dg Chest Port 1 View  Result Date: 06/27/2018 CLINICAL DATA:  Cough tonight. EXAM: PORTABLE CHEST 1 VIEW COMPARISON:  Radiograph 05/16/2016 FINDINGS: The heart is enlarged. Indistinct perihilar pulmonary vascularity. No focal airspace disease. No pleural effusion or pneumothorax. No acute osseous abnormalities are seen. IMPRESSION: Cardiomegaly. Indistinct perihilar pulmonary vasculature suggesting vascular congestion and or edema. No pneumonia. Electronically Signed   By: Rubye Oaks M.D.   On:  06/27/2018 01:50    Medications: I have reviewed the patient's current medications.  Assessment:   1. Left buttocks abscess with gangrene.  Has been drained by surgery.  Patient apparently has been chronically constipated and this abscess was very close to the anus.  She is on appropriate antibiotics.   Plan: We will consider placing her on MiraLAX in order to soften her bowel movements.  Does not appear that she needs any diagnostic or therapeutic GI intervention at this time.  Therefore we will sign off but please call us if we can be of any further help.   Tresea Mall 06/28/2018, 2:46 PM  This note was created using voice recognition software. Minor errors may Have occurred unintentionally.  Pager: [phone] If no answer or after hours call [phone]

## 2018-06-28 NOTE — Progress Notes (Signed)
Subjective:  Went to OR last PM for debridement and was transferred to ICU with hypotension req neosynephrine post op.  TTE with normal EF and grade 2 DD This morning she is awake and says she feels well   Objective Vital signs in last 24 hours: Vitals:   06/28/18 0515 06/28/18 0530 06/28/18 0545 06/28/18 0600  BP: 114/65 119/73 (!) 107/50 (!) 86/63  Pulse: 82 81 78 78  Resp: (!) 32 (!) 31 (!) 28 (!) 27  Temp:      TempSrc:      SpO2: 96% 100% 98% 100%  Weight:      Height:       Weight change: 5.916 kg (13 lb 0.7 oz)  Intake/Output Summary (Last 24 hours) at 06/28/2018 0733 Last data filed at 06/28/2018 0600 Gross per 24 hour  Intake 5579.49 ml  Output 1585 ml  Net 3994.49 ml    Assessment/ Plan:   1.  Severe AKI: hypovolemia + ARB prior to admission.  On admission Cr mid 5s, Urine Na <5.  Cr improved to 3.8 last pm at 23:30.  UOP 1.4L yesterday.  I hope that she continues to recover from here, however with the hypotension overnight she's had another insult and we may see another wave of AKI.  Monitor for now.  No indications for HD and if she develops them will discuss with pt and family further.  Not a great long term dialysis candidate but would discuss short term if needed.   Agree with bolus + MIVF 100/hr for now.  She has grade 2 diastolic dysfunction so will need to watch volume status closely.   2. Anemia:  Not related to AKI, defer mgmt to CCM.    Tyler Pita    Labs: Basic Metabolic Panel: Recent Labs  Lab 06/26/18 2106 06/27/18 0416 06/27/18 2332  NA 136 141 143  K 5.1 4.6 4.3  CL 99 107 112*  CO2 18* 21* 16*  GLUCOSE 112* 85 103*  BUN 100* 96* 94*  CREATININE 5.59* 5.04* 3.82*  CALCIUM 8.5* 7.8* 7.5*   Liver Function Tests: Recent Labs  Lab 06/26/18 2106 06/27/18 0416  AST 22 19  ALT 13 12  ALKPHOS 131* 101  BILITOT 1.6* 1.3*  PROT 6.1* 5.3*  ALBUMIN 2.7* 2.2*   No results for input(s): LIPASE, AMYLASE in the last 168 hours. No  results for input(s): AMMONIA in the last 168 hours. CBC: Recent Labs  Lab 06/26/18 2106 06/27/18 0416 06/27/18 1624 06/27/18 2312 06/27/18 2332  WBC 16.1* 13.0* 8.0  --  8.2  HGB 10.7* 9.0* 9.5* 8.1* 8.8*  HCT 32.6* 28.7* 28.7* 25.2* 29.0*  MCV 93.4 95.0 93.2  --  100.7*  PLT 143* 119* 124*  --  107*   Cardiac Enzymes: Recent Labs  Lab 06/27/18 0003 06/27/18 0416 06/27/18 1039  TROPONINI <0.03 <0.03 0.03*   CBG: Recent Labs  Lab 06/27/18 2228  GLUCAP 98    Iron Studies: No results for input(s): IRON, TIBC, TRANSFERRIN, FERRITIN in the last 72 hours. Studies/Results: Ct Abdomen Pelvis Wo Contrast  Result Date: 06/27/2018 CLINICAL DATA:  Initial evaluation for acute abdominal pain. EXAM: CT ABDOMEN AND PELVIS WITHOUT CONTRAST TECHNIQUE: Multidetector CT imaging of the abdomen and pelvis was performed following the standard protocol without IV contrast. COMPARISON:  Prior CT from 03/16/2011. FINDINGS: Lower chest: Scattered subsegmental atelectatic changes present within the visualized lung bases. 7 mm nodular density at the right lung base (series 4, image 2),  slightly more prominent as compared to previous exam. Cardiomegaly partially visualized. Hepatobiliary: Limited noncontrast evaluation of the liver is unremarkable. Gallbladder surgically absent. No biliary dilatation. Pancreas: Pancreas somewhat atrophic in appearance. No acute peripancreatic inflammation. Spleen: Spleen within normal limits. Adrenals/Urinary Tract: Left adrenal adenoma noted, stable. Asymmetric atrophy of the left kidney is compared to the right, similar to previous. No nephrolithiasis or hydronephrosis. No hydroureter. Bladder largely decompressed with a Foley catheter in place. Gas lucency within the bladder lumen consistent with catheterization. Stomach/Bowel: Small hiatal hernia. Stomach otherwise unremarkable. Hazy inflammatory stranding about a loop of small bowel within the left abdomen (series 3,  image 44), suggesting possible acute enteritis. Small bowel of normal caliber without obstruction. Colon diffusely decompressed. Extensive sigmoid diverticulosis without evidence for acute diverticulitis. Vascular/Lymphatic: Moderate to advanced aorto bi-iliac atherosclerotic disease. No aneurysm. No pathologically enlarged intra-abdominal or pelvic lymph nodes. Shotty subcentimeter mesenteric lymph nodes noted within the area of inflammatory stranding within the mid abdomen, which may be reactive. Reproductive: Uterus within normal limits. Calcification noted at the left ovary. 2.5 cm simple right adnexal cyst, most likely benign given size and appearance. Other: No free air or fluid. Small fat containing paraumbilical hernia without associated inflammation. Musculoskeletal: Asymmetric hazy stranding within the partially visualized subcutaneous fat of the left buttock (series 3, image 91). No loculated collections. Left hip arthroplasty in place. Degenerative changes at the right hip. No acute osseus abnormality. No worrisome lytic or blastic osseous lesions. Advanced degenerative spondylolysis and facet arthrosis throughout the visualized spine. IMPRESSION: 1. Focal hazy inflammatory stranding adjacent to a loop of small bowel within the left abdomen, suspicious for acute enteritis. No associated obstruction or other complication identified. 2. Asymmetric hazy inflammatory stranding within the subcutaneous fat of the left buttock, nonspecific, but could reflect sequelae of acute cellulitis. Correlation with physical exam recommended. No discrete collections identified. 3. Sigmoid diverticulosis without evidence for acute diverticulitis. 4. Cardiomegaly with advanced aorto bi-iliac atherosclerotic disease. 5. 7 mm nodule at the right lung base, increased in prominence as compared to 2012. Non-contrast chest CT at 6-12 months is recommended. If the nodule is stable at time of repeat CT, then future CT at 18-24  months (from today's scan) is considered optional for low-risk patients, but is recommended for high-risk patients. This recommendation follows the consensus statement: Guidelines for Management of Incidental Pulmonary Nodules Detected on CT Images: From the Fleischner Society 2017; Radiology 2017; 284:228-243. Electronically Signed   By: Rise Mu M.D.   On: 06/27/2018 02:06   Dg Chest Port 1 View  Result Date: 06/27/2018 CLINICAL DATA:  Cough tonight. EXAM: PORTABLE CHEST 1 VIEW COMPARISON:  Radiograph 05/16/2016 FINDINGS: The heart is enlarged. Indistinct perihilar pulmonary vascularity. No focal airspace disease. No pleural effusion or pneumothorax. No acute osseous abnormalities are seen. IMPRESSION: Cardiomegaly. Indistinct perihilar pulmonary vasculature suggesting vascular congestion and or edema. No pneumonia. Electronically Signed   By: Rubye Oaks M.D.   On: 06/27/2018 01:50   Medications: Infusions: . sodium chloride 100 mL/hr at 06/28/18 0600  . sodium chloride 10 mL/hr at 06/28/18 0600  . albumin human Stopped (06/27/18 1956)  . albumin human    . clindamycin (CLEOCIN) IV 600 mg (06/28/18 0604)  . famotidine (PEPCID) IV    . phenylephrine (NEO-SYNEPHRINE) Adult infusion 20 mcg/min (06/28/18 0600)  . piperacillin-tazobactam (ZOSYN)  IV Stopped (06/28/18 1610)    Scheduled Medications: . allopurinol  100 mg Oral Daily  . levothyroxine  137 mcg Oral QAC breakfast  .  sertraline  50 mg Oral Daily    have reviewed scheduled and prn medications.  Physical Exam: General: awake in bed, brushing dentures Heart: RRR, no rub Lungs: normal WOB, clear Abdomen: soft nontender Extremities: no edema     06/28/2018,7:33 AM  LOS: 2 days

## 2018-06-28 NOTE — Progress Notes (Addendum)
Physical Therapy Wound Evaluation and Treatment Patient Details  Name: [name] MRN: [id number] Date of Birth: [birth year]  Today's Date: 06/28/2018 Time: 5027-7412 Time Calculation (min): 56 min  Subjective  Subjective: "I'm fine, I can barely feel it." Patient and Family Stated Goals: None stated throughout session Date of Onset: (Unsure) Prior Treatments: Surgical debridement 06/27/18  Pain Score:  Pt does not report pain throughout session - premedicated prior to start of hydrotherapy  Wound Assessment  Pressure Injury 06/27/18 Unstageable - Full thickness tissue loss in which the base of the ulcer is covered by slough (yellow, tan, gray, green or brown) and/or eschar (tan, brown or black) in the wound bed. Three areas of black eschar surrounded by blan (Active)  Wound Image   06/28/2018 12:00 PM  Dressing Type ABD;Moist to dry;Barrier Film (skin prep);Gauze (Comment) 06/28/2018  2:00 PM  Dressing Clean;Dry;Intact;Changed 06/28/2018  2:00 PM  Dressing Change Frequency Daily 06/28/2018  2:00 PM  State of Healing Eschar 06/28/2018  2:00 PM  Site / Wound Assessment Yellow;Red;Black 06/28/2018  2:00 PM  % Wound base Red or Granulating 30% 06/28/2018  2:00 PM  % Wound base Yellow/Fibrinous Exudate 15% 06/28/2018  2:00 PM  % Wound base Black/Eschar 55% 06/28/2018  2:00 PM  Peri-wound Assessment Intact;Pink;Erythema (blanchable) 06/28/2018  2:00 PM  Wound Length (cm) 15 cm 06/28/2018  1:00 PM  Wound Width (cm) 10.3 cm 06/28/2018  1:00 PM  Wound Depth (cm) 3.8 cm 06/28/2018  1:00 PM  Wound Surface Area (cm^2) 154.5 cm^2 06/28/2018  1:00 PM  Wound Volume (cm^3) 587.1 cm^3 06/28/2018  1:00 PM  Undermining (cm) 2.0cm from 6-7 o'clock; 2.2cm 7-9 o'clock; 3.9cm from 9-10 o'clock; 3 cm from 10-12 o'clock  06/28/2018  1:00 PM  Margins Unattached edges (unapproximated) 06/28/2018  2:00 PM  Drainage Amount Copious 06/28/2018  2:00 PM  Drainage Description Serosanguineous;Odor 06/28/2018  2:00 PM  Treatment Debridement  (Selective);Hydrotherapy (Pulse lavage);Packing (Saline gauze) 06/28/2018  2:00 PM      Hydrotherapy Pulsed lavage therapy - wound location: Buttock Pulsed Lavage with Suction (psi): 12 psi Pulsed Lavage with Suction - Normal Saline Used: 1000 mL Pulsed Lavage Tip: Tip with splash shield Selective Debridement Selective Debridement - Location: Buttock Selective Debridement - Tools Used: Forceps;Scalpel;Scissors Selective Debridement - Tissue Removed: Yellow and block necrotic tissue   Wound Assessment and Plan  Wound Therapy - Assess/Plan/Recommendations Wound Therapy - Clinical Statement: Pt presents to hydrotherapy with an unstagable pressure injury s/p surgical debridement on 06/27/18. When PT arrived bed pad saturated with copious amounts of foul smelling drainage. A significant amount of necrotic tissue was removed however a significant amount still remains as well. Discussed with RN use of Santyl for next session if surgical team agreeable. This patient will benefit from continued hydrotherapy for selective removal of unviable tissue, to decrease bioburden and promote wound bed healing.  Wound Therapy - Functional Problem List: Decreased tolerance for OOB mobility/position changes Factors Delaying/Impairing Wound Healing: Immobility;Multiple medical problems;Altered sensation Hydrotherapy Plan: Debridement;Dressing change;Patient/family education;Pulsatile lavage with suction Wound Therapy - Frequency: 6X / week Wound Therapy - Follow Up Recommendations: Skilled nursing facility Wound Plan: See above  Wound Therapy Goals- Improve the function of patient's integumentary system by progressing the wound(s) through the phases of wound healing (inflammation - proliferation - remodeling) by: Decrease Necrotic Tissue to: 20% Decrease Necrotic Tissue - Progress: Goal set today Increase Granulation Tissue to: 80% Increase Granulation Tissue - Progress: Goal set today Goals/treatment  plan/discharge plan were made with  and agreed upon by patient/family: Yes Time For Goal Achievement: 7 days Wound Therapy - Potential for Goals: Good  Goals will be updated until maximal potential achieved or discharge criteria met.  Discharge criteria: when goals achieved, discharge from hospital, MD decision/surgical intervention, no progress towards goals, refusal/missing three consecutive treatments without notification or medical reason.  GP     Thelma Comp 06/28/2018, 3:06 PM   Rolinda Roan, PT, DPT Acute Rehabilitation Services Pager: [phone]

## 2018-06-28 NOTE — Progress Notes (Signed)
eLink Physician-Brief Progress Note Patient Name: Mickie KayGracie B Shiflett DOB: 07/08/1928 MRN: [id number]   Date of Service  06/28/2018  HPI/Events of Note  Serum lactate 3.0  eICU Interventions  Normal saline 500 ml iv fluid bolus, 5 % Albumin 12.5 gm iv x 1, wean Phenylephrine as tolerated, PCCM consultation.        Thomasene Lotkoronkwo U Renan Danese 06/28/2018, 12:39 AM

## 2018-06-28 NOTE — Progress Notes (Signed)
Notified MD about critical lab, lactic acid 2.5.

## 2018-06-28 NOTE — Consult Note (Signed)
WOC Nurse wound follow up Seen by may partner, S. Doty yesterday who recommended surgery.  Surgical intervention yesterday and [facility] is following. WOC nursing team will not follow, but will remain available to this patient, the nursing and medical teams.  Please re-consult if needed. Thanks, Ladona MowLaurie Donesha Wallander, MSN, RN, GNP, Hans EdenCWOCN, CWON-AP, FAAN  Pager# [phone]

## 2018-06-28 NOTE — Progress Notes (Signed)
CRITICAL VALUE ALERT  Critical Value:  Lactic Acid 3.0  Date & Time Notied:  06/28/2018 0032  Provider Notified: Warrick Parisiangan  [facility] Received/Actions taken[facility]

## 2018-06-28 NOTE — Progress Notes (Signed)
PROGRESS NOTE    [name]  ZOX:[id number] DOB: [birth year] DOA: 06/26/2018 PCP: Kirby Funk, MD      Brief Narrative:  [name] is a 84 y.o. F with HTN, hypothyroidism who presents with 1 week buttock pain, followed by malaise, diarrhea, and weakness.    Went to UC, where she was told she was "dehydrated".  Came to the ER, where she was found to have BP 80/40, leukocytosis, Cr 5.5, and lactic acidosis.  Given 30 cc/kg fluids, lactate cleared, BP improved somewhat, she was mentating well with MAP>60.  Started on ceftriaxone for presumed UTI.  CCM were consulted, recommended continued medical therapy with fluids, antibiotics.  CT abdomen showed ?enteritis, also buttock cellulitis.  Buttock examination showed cellulitis with necrosis.     Assessment & Plan:  Sepsis Buttock cellulitis with necrosis Debrided yesterday evening.  MAPs in 50s afterwards, transferred to ICU on Neo.  Now weaned off Neo, BP up to 110s systolic.  Mentating well.  HR good. -Continue vancomycin, Zosyn, clindamycin -Follow blood cultures and intraoperative culture - Consult to general surgery, appreciate expert cares     Acute renal failure on chronic kidney disease stage III Baseline creatinine 1.4-1.5.  Admitted with creatinine 5.5, improved to 3.8 overnight.  Urine output good. -Avoid nephrotoxins -Hold Lasix -Continue IV fluids -Trend creatinine   Chronic normocytic anemia Possible acute blood loss anemia There is talk of a diverting colostomy, per patient neurosurgery notes also suggest SP tube or Foley chronically.    Low suspicion for GI bleed.  Hemoglobin at baseline 10-11. -Trend CBC -Continue famotidine -Consult gastroenterology, appreciate recommendations  Enteritis This is a nonspecific finding on CT.  Monitor stools.  Hypothyroidism -Continue levothyroxine  Hypertension Hypotensive at admission -Hold BP meds  Other medications -Continue allopurinol -Continue Zoloft         DVT prophylaxis: SCDs Code Status: DO NOT RESUSCITATE Family Communication: Daughter and son-in-law at the bedside MDM and disposition Plan: The below labs and imaging reports were reviewed and summarized above.  The patient was admitted with sepsis, found to be from a buttock cellulitis with necrosis and abscess, general surgery consulted and debrided in the operating room last night.  She was briefly on pressors for septic shock.  She has also renal failure, which is improving.     She also has a new anemia, and question of melena, gastroenterology are consulted.  Will require further debridement in the operating room, as well as possible diverting colostomy and SP catheter.  Once surgical treatment is finished, we will evaluate with PT, likely will need placement.  Consultants:   GI  Nephrology  General Surgery  Procedures:   None  Antimicrobials:   Ceftriaxone 7/31 >> 7/31  Vancomycin 7/31 >>  Flagyl 7/31 >> 7/31  Clindamycin 7/31 >>  Zosyn 7/31 >>   Cultures:   7/30 Blood cultures x2: No results  7/30 Urine culture: GNRs  7/31 intraoperative culture: NGTD      Subjective: Weaned off of Neo-Synephrine this morning.  Some pain in her bladder, mild pain with eating.  No vomiting.  No fever.  Mentation is good.  No bowel movements or diarrhea.  No more melena.     Objective: Vitals:   06/28/18 0800 06/28/18 0818 06/28/18 0900 06/28/18 1000  BP: (!) 98/55  (!) 98/55 (!) 108/59  Pulse: 77  80 76  Resp: (!) 32  (!) 29 (!) 39  Temp:  97.6 F (36.4 C)    TempSrc:  Oral  SpO2: 100%  100% 100%  Weight:      Height:        Intake/Output Summary (Last 24 hours) at 06/28/2018 1041 Last data filed at 06/28/2018 1000 Gross per 24 hour  Intake 6469.88 ml  Output 1210 ml  Net 5259.88 ml   Filed Weights   06/27/18 0321 06/28/18 0000 06/28/18 0500  Weight: 101.8 kg (224 lb 6.9 oz) 104.8 kg (231 lb 0.7 oz) 104 kg (229 lb 4.5 oz)     Examination: General appearance: Obese adult female, lying in bed, interactive, appears tired but no acute distress HEENT: Anicteric, conjunctival pink, lids and lashes normal, no nasal deformity, discharge, or epistaxis, lips moist, teeth normal, oropharynx moist without oral lesions.  Hearing normal. Skin: Skin warm and dry, dressing on buttock wound, not examined Cardiac: Regular rate and rhythm, no murmurs, JVP not visible, no lower extremity edema. Respiratory: Respiratory rate and rhythm normal.  Lungs clear without rales or wheezes. Abdomen: Abdomen soft without tenderness to palpation or guarding. MSK: No deformities or effusions. Neuro: Awake and alert, extraocular movements intact,'s movements symmetric, globally weak.  Speech fluent.    Psych: Sensorium intact responding to questions, attention normal, affect normal.  Judgment and insight appear normal.   Data Reviewed: I have personally reviewed following labs and imaging studies:  CBC: Recent Labs  Lab 06/26/18 2106 06/27/18 0416 06/27/18 1624 06/27/18 2312 06/27/18 2332  WBC 16.1* 13.0* 8.0  --  8.2  HGB 10.7* 9.0* 9.5* 8.1* 8.8*  HCT 32.6* 28.7* 28.7* 25.2* 29.0*  MCV 93.4 95.0 93.2  --  100.7*  PLT 143* 119* 124*  --  107*   Basic Metabolic Panel: Recent Labs  Lab 06/26/18 2106 06/27/18 0003 06/27/18 0416 06/27/18 2332  NA 136  --  141 143  K 5.1  --  4.6 4.3  CL 99  --  107 112*  CO2 18*  --  21* 16*  GLUCOSE 112*  --  85 103*  BUN 100*  --  96* 94*  CREATININE 5.59*  --  5.04* 3.82*  CALCIUM 8.5*  --  7.8* 7.5*  MG  --  2.1  --  2.0   GFR: Estimated Creatinine Clearance: 12 mL/min (A) (by C-G formula based on SCr of 3.82 mg/dL (H)). Liver Function Tests: Recent Labs  Lab 06/26/18 2106 06/27/18 0416  AST 22 19  ALT 13 12  ALKPHOS 131* 101  BILITOT 1.6* 1.3*  PROT 6.1* 5.3*  ALBUMIN 2.7* 2.2*   No results for input(s): LIPASE, AMYLASE in the last 168 hours. No results for input(s):  AMMONIA in the last 168 hours. Coagulation Profile: Recent Labs  Lab 06/26/18 2106  INR 1.24   Cardiac Enzymes: Recent Labs  Lab 06/27/18 0003 06/27/18 0416 06/27/18 1039  TROPONINI <0.03 <0.03 0.03*   BNP (last 3 results) No results for input(s): PROBNP in the last 8760 hours. HbA1C: No results for input(s): HGBA1C in the last 72 hours. CBG: Recent Labs  Lab 06/27/18 2228  GLUCAP 98   Lipid Profile: No results for input(s): CHOL, HDL, LDLCALC, TRIG, CHOLHDL, LDLDIRECT in the last 72 hours. Thyroid Function Tests: No results for input(s): TSH, T4TOTAL, FREET4, T3FREE, THYROIDAB in the last 72 hours. Anemia Panel: No results for input(s): VITAMINB12, FOLATE, FERRITIN, TIBC, IRON, RETICCTPCT in the last 72 hours. Urine analysis:    Component Value Date/Time   COLORURINE YELLOW 06/27/2018 0043   APPEARANCEUR HAZY (A) 06/27/2018 0043   LABSPEC 1.012 06/27/2018 0043  PHURINE 5.0 06/27/2018 0043   GLUCOSEU NEGATIVE 06/27/2018 0043   HGBUR NEGATIVE 06/27/2018 0043   BILIRUBINUR NEGATIVE 06/27/2018 0043   KETONESUR NEGATIVE 06/27/2018 0043   PROTEINUR NEGATIVE 06/27/2018 0043   NITRITE NEGATIVE 06/27/2018 0043   LEUKOCYTESUR SMALL (A) 06/27/2018 0043   Sepsis Labs: @LABRCNTIP (procalcitonin:4,lacticacidven:4)  ) Recent Results (from the past 240 hour(s))  Urine Culture     Status: Abnormal (Preliminary result)   Collection Time: 06/27/18 12:43 AM  Result Value Ref Range Status   Specimen Description URINE, RANDOM  Final   Special Requests   Final    NONE Performed at [facility], [address] [place], [place] [address]    Culture >=100,000 COLONIES/mL GRAM NEGATIVE RODS (A)  Final   Report Status PENDING  Incomplete  MRSA PCR Screening     Status: None   Collection Time: 06/27/18  3:16 AM  Result Value Ref Range Status   MRSA by PCR NEGATIVE NEGATIVE Final    Comment:        The GeneXpert MRSA Assay (FDA approved for NASAL specimens only), is one  component of a comprehensive MRSA colonization surveillance program. It is not intended to diagnose MRSA infection nor to guide or monitor treatment for MRSA infections. Performed at [facility], [address], [place], [place] [address]   Aerobic/Anaerobic Culture (surgical/deep wound)     Status: None (Preliminary result)   Collection Time: 06/27/18  6:01 PM  Result Value Ref Range Status   Specimen Description ABSCESS  Final   Special Requests NONE  Final   Gram Stain   Final    NO WBC SEEN MODERATE GRAM POSITIVE COCCI IN PAIRS MODERATE GRAM NEGATIVE COCCOBACILLI    Culture   Final    NO GROWTH < 24 HOURS Performed at [facility], [address] [place], [place] [address]    Report Status PENDING  Incomplete         Radiology Studies: Ct Abdomen Pelvis Wo Contrast  Result Date: 06/27/2018 CLINICAL DATA:  Initial evaluation for acute abdominal pain. EXAM: CT ABDOMEN AND PELVIS WITHOUT CONTRAST TECHNIQUE: Multidetector CT imaging of the abdomen and pelvis was performed following the standard protocol without IV contrast. COMPARISON:  Prior CT from 03/16/2011. FINDINGS: Lower chest: Scattered subsegmental atelectatic changes present within the visualized lung bases. 7 mm nodular density at the right lung base (series 4, image 2), slightly more prominent as compared to previous exam. Cardiomegaly partially visualized. Hepatobiliary: Limited noncontrast evaluation of the liver is unremarkable. Gallbladder surgically absent. No biliary dilatation. Pancreas: Pancreas somewhat atrophic in appearance. No acute peripancreatic inflammation. Spleen: Spleen within normal limits. Adrenals/Urinary Tract: Left adrenal adenoma noted, stable. Asymmetric atrophy of the left kidney is compared to the right, similar to previous. No nephrolithiasis or hydronephrosis. No hydroureter. Bladder largely decompressed with a Foley catheter in place. Gas lucency within the bladder lumen  consistent with catheterization. Stomach/Bowel: Small hiatal hernia. Stomach otherwise unremarkable. Hazy inflammatory stranding about a loop of small bowel within the left abdomen (series 3, image 44), suggesting possible acute enteritis. Small bowel of normal caliber without obstruction. Colon diffusely decompressed. Extensive sigmoid diverticulosis without evidence for acute diverticulitis. Vascular/Lymphatic: Moderate to advanced aorto bi-iliac atherosclerotic disease. No aneurysm. No pathologically enlarged intra-abdominal or pelvic lymph nodes. Shotty subcentimeter mesenteric lymph nodes noted within the area of inflammatory stranding within the mid abdomen, which may be reactive. Reproductive: Uterus within normal limits. Calcification noted at the left ovary. 2.5 cm  simple right adnexal cyst, most likely benign given size and appearance. Other: No free air or fluid. Small fat containing paraumbilical hernia without associated inflammation. Musculoskeletal: Asymmetric hazy stranding within the partially visualized subcutaneous fat of the left buttock (series 3, image 91). No loculated collections. Left hip arthroplasty in place. Degenerative changes at the right hip. No acute osseus abnormality. No worrisome lytic or blastic osseous lesions. Advanced degenerative spondylolysis and facet arthrosis throughout the visualized spine. IMPRESSION: 1. Focal hazy inflammatory stranding adjacent to a loop of small bowel within the left abdomen, suspicious for acute enteritis. No associated obstruction or other complication identified. 2. Asymmetric hazy inflammatory stranding within the subcutaneous fat of the left buttock, nonspecific, but could reflect sequelae of acute cellulitis. Correlation with physical exam recommended. No discrete collections identified. 3. Sigmoid diverticulosis without evidence for acute diverticulitis. 4. Cardiomegaly with advanced aorto bi-iliac atherosclerotic disease. 5. 7 mm nodule at  the right lung base, increased in prominence as compared to 2012. Non-contrast chest CT at 6-12 months is recommended. If the nodule is stable at time of repeat CT, then future CT at 18-24 months (from today's scan) is considered optional for low-risk patients, but is recommended for high-risk patients. This recommendation follows the consensus statement: Guidelines for Management of Incidental Pulmonary Nodules Detected on CT Images: From the Fleischner Society 2017; Radiology 2017; 284:228-243. Electronically Signed   By: Rise MuBenjamin  McClintock M.D.   On: 06/27/2018 02:06   Dg Chest Port 1 View  Result Date: 06/27/2018 CLINICAL DATA:  Cough tonight. EXAM: PORTABLE CHEST 1 VIEW COMPARISON:  Radiograph 05/16/2016 FINDINGS: The heart is enlarged. Indistinct perihilar pulmonary vascularity. No focal airspace disease. No pleural effusion or pneumothorax. No acute osseous abnormalities are seen. IMPRESSION: Cardiomegaly. Indistinct perihilar pulmonary vasculature suggesting vascular congestion and or edema. No pneumonia. Electronically Signed   By: Rubye OaksMelanie  Ehinger M.D.   On: 06/27/2018 01:50        Scheduled Meds: . allopurinol  100 mg Oral Daily  . levothyroxine  137 mcg Oral QAC breakfast  . sertraline  50 mg Oral Daily   Continuous Infusions: . sodium chloride 100 mL/hr at 06/28/18 1000  . clindamycin (CLEOCIN) IV Stopped (06/28/18 [id number])  . famotidine (PEPCID) IV    . piperacillin-tazobactam (ZOSYN)  IV Stopped (06/28/18 0552)     LOS: 2 days    Time spent: 25 minutes    Alberteen Samhristopher P Aryianna Earwood, MD [facility] 06/28/2018, 10:41 AM     Pager [phone] --- please page though AMION:  [email or web address] If 7PM-7AM, please contact night-coverage

## 2018-06-28 NOTE — Progress Notes (Addendum)
PULMONARY / CRITICAL CARE MEDICINE  Name: [name] MRN: [id number] DOB: 24-Jan-1928    ADMISSION DATE:  06/26/2018 CONSULTATION DATE:  06/27/18  REFERRING MD :  Selena Batten  CHIEF COMPLAINT:  Hypotension   HISTORY OF PRESENT ILLNESS:  [name] is a 84 y.o. female with a PMH as outlined below.  She presented to [facility] ED 7/30 with generalized weakness, decreased PO intake, right buttock pain.  Symptoms started 2 days prior and progressed.  1 day prior to presentation also had diarrhea that was dark black.  She had taken one dose of pepto bismol 2 days prior.  Denies NSAIDS, ASA. Denies any fevers/chills/sweats (though states she is always cold), headaches, chest pain, nausea, vomiting, myalgias.  Has had mild dry cough and some exertional dyspnea which she states is chronic and has not worsened.  Does have some generalized abdominal tenderness.  Felt so weak that went to urgent care 7/30 and while there, was told that she was "dry" so should come to ED.  In ED, she was noted to have SBP in 80s along with AKI (SCr 5.5 with baseline around 1.5).  She received 1.5L NS bolus and SBP improved to low 90s.  PCCM was called for assistance.  She was taken to the operating room 06/27/2018 late evening and had extensive debridement of left gluteal wound and abscess which measured 18 cm x 18 cm x 6 cm deep.  Following surgery she had a episode of hypotension that was refractory to IV fluids.  She was briefly on Neo-Synephrine but is now hemodynamically stable off pressors.  Pulmonary critical care was called back to the bedside on 8 1 due to hypotension.  Have spoken with Dr. Maryfrances Bunnell in her care will continue to be managed by the [facility] hospitalist team.  Pulmonary critical care is available as needed.  Of note she may need IV access with her creatinine being 3.82 PICC line although would be the best option may not be available due to protocols.  PCCM can place central line if needed and the family is willing to  have her undergo central line placement.     SUBJECTIVE:   84 year old female who is post debridement of left gluteal wound who briefly required vasopressor support.  She does have acute kidney injury that is improving with fluids.  Awake alert no acute distress.  VITAL SIGNS: Temp:  [97.3 F (36.3 C)-98.1 F (36.7 C)] 97.6 F (36.4 C) (08/01 0405) Pulse Rate:  [29-90] 29 (08/01 0700) Resp:  [13-32] 30 (08/01 0700) BP: (76-145)/(31-112) 106/52 (08/01 0700) SpO2:  [93 %-100 %] 99 % (08/01 0700) Weight:  [229 lb 4.5 oz (104 kg)-231 lb 0.7 oz (104.8 kg)] 229 lb 4.5 oz (104 kg) (08/01 0500)  PHYSICAL EXAMINATION: General: Obese female awake alert no acute distress HEENT: No JVD or lymphadenopathy is appreciated Neuro: Intact moves all extremities complains of mild buttock pain CV: s1s2 rrr, no m/r/g PULM: even/non-labored, lungs bilaterally decreased in bases GI: Mild tenderness positive bowel Extremities: warm/dry, 1+ edema  Skin: Gluteal dressing dry and intact    Recent Labs  Lab 06/26/18 2106 06/27/18 0416 06/27/18 2332  NA 136 141 143  K 5.1 4.6 4.3  CL 99 107 112*  CO2 18* 21* 16*  BUN 100* 96* 94*  CREATININE 5.59* 5.04* 3.82*  GLUCOSE 112* 85 103*   Recent Labs  Lab 06/27/18 0416 06/27/18 1624 06/27/18 2312 06/27/18 2332  HGB 9.0* 9.5* 8.1* 8.8*  HCT 28.7* 28.7* 25.2*  29.0*  WBC 13.0* 8.0  --  8.2  PLT 119* 124*  --  107*   Ct Abdomen Pelvis Wo Contrast  Result Date: 06/27/2018 CLINICAL DATA:  Initial evaluation for acute abdominal pain. EXAM: CT ABDOMEN AND PELVIS WITHOUT CONTRAST TECHNIQUE: Multidetector CT imaging of the abdomen and pelvis was performed following the standard protocol without IV contrast. COMPARISON:  Prior CT from 03/16/2011. FINDINGS: Lower chest: Scattered subsegmental atelectatic changes present within the visualized lung bases. 7 mm nodular density at the right lung base (series 4, image 2), slightly more prominent as compared to  previous exam. Cardiomegaly partially visualized. Hepatobiliary: Limited noncontrast evaluation of the liver is unremarkable. Gallbladder surgically absent. No biliary dilatation. Pancreas: Pancreas somewhat atrophic in appearance. No acute peripancreatic inflammation. Spleen: Spleen within normal limits. Adrenals/Urinary Tract: Left adrenal adenoma noted, stable. Asymmetric atrophy of the left kidney is compared to the right, similar to previous. No nephrolithiasis or hydronephrosis. No hydroureter. Bladder largely decompressed with a Foley catheter in place. Gas lucency within the bladder lumen consistent with catheterization. Stomach/Bowel: Small hiatal hernia. Stomach otherwise unremarkable. Hazy inflammatory stranding about a loop of small bowel within the left abdomen (series 3, image 44), suggesting possible acute enteritis. Small bowel of normal caliber without obstruction. Colon diffusely decompressed. Extensive sigmoid diverticulosis without evidence for acute diverticulitis. Vascular/Lymphatic: Moderate to advanced aorto bi-iliac atherosclerotic disease. No aneurysm. No pathologically enlarged intra-abdominal or pelvic lymph nodes. Shotty subcentimeter mesenteric lymph nodes noted within the area of inflammatory stranding within the mid abdomen, which may be reactive. Reproductive: Uterus within normal limits. Calcification noted at the left ovary. 2.5 cm simple right adnexal cyst, most likely benign given size and appearance. Other: No free air or fluid. Small fat containing paraumbilical hernia without associated inflammation. Musculoskeletal: Asymmetric hazy stranding within the partially visualized subcutaneous fat of the left buttock (series 3, image 91). No loculated collections. Left hip arthroplasty in place. Degenerative changes at the right hip. No acute osseus abnormality. No worrisome lytic or blastic osseous lesions. Advanced degenerative spondylolysis and facet arthrosis throughout the  visualized spine. IMPRESSION: 1. Focal hazy inflammatory stranding adjacent to a loop of small bowel within the left abdomen, suspicious for acute enteritis. No associated obstruction or other complication identified. 2. Asymmetric hazy inflammatory stranding within the subcutaneous fat of the left buttock, nonspecific, but could reflect sequelae of acute cellulitis. Correlation with physical exam recommended. No discrete collections identified. 3. Sigmoid diverticulosis without evidence for acute diverticulitis. 4. Cardiomegaly with advanced aorto bi-iliac atherosclerotic disease. 5. 7 mm nodule at the right lung base, increased in prominence as compared to 2012. Non-contrast chest CT at 6-12 months is recommended. If the nodule is stable at time of repeat CT, then future CT at 18-24 months (from today's scan) is considered optional for low-risk patients, but is recommended for high-risk patients. This recommendation follows the consensus statement: Guidelines for Management of Incidental Pulmonary Nodules Detected on CT Images: From the Fleischner Society 2017; Radiology 2017; 284:228-243. Electronically Signed   By: Rise Mu M.D.   On: 06/27/2018 02:06   Dg Chest Port 1 View  Result Date: 06/27/2018 CLINICAL DATA:  Cough tonight. EXAM: PORTABLE CHEST 1 VIEW COMPARISON:  Radiograph 05/16/2016 FINDINGS: The heart is enlarged. Indistinct perihilar pulmonary vascularity. No focal airspace disease. No pleural effusion or pneumothorax. No acute osseous abnormalities are seen. IMPRESSION: Cardiomegaly. Indistinct perihilar pulmonary vasculature suggesting vascular congestion and or edema. No pneumonia. Electronically Signed   By: Rubye Oaks M.D.   On:  06/27/2018 01:50    STUDIES:  CXR 7/31 > cardiomegaly CT A / P 7/31 > questionable  Enteritis, inflammatory changes with subcutaneous fat of left buttock.  Of note she had a 7 mm nodule right lung base  SIGNIFICANT EVENTS  7/31 >  admit. 06/27/2018 taken to the OR for debridement of left gluteal abscess 06/28/2018 transferred to ICU due to pressor needs  Abx: 06/28/2018 Cleocin>> 06/28/2018 Zosyn>> 06/28/2018 vancomycin>>    Culture data:  06/27/2018 wound culture>> 06/27/2018 MRSA swab>>neg 7/31 blood cultures x10>> 06/27/2018 urine culture with greater than 100,000 colonies gram-negative rods>>      ASSESSMENT / PLAN:  Hypotension on 06/28/2018 following debridement of left gluteal abscess measuring 18 cm x 18 cm x 6 cm deep Per [facility].  She briefly required Neo-Synephrine to keep her blood pressure greater than systolic of 90 or mean arterial pressure greater than 60.  She is continued to receive aggressive IV fluids. 06/28/2018 transfer to intensive care unit while being on neo-Synephrine and felt to be refractory shock.  She subsequently came off Neo-Synephrine is hemodynamically stable. Evaluated by pulmonary critical care 06/28/2018 0730 hrs. found to be hemodynamically stable off Neo-Synephrine awake alert and in no acute distress. Plan:  Agree with IV fluids as needed Continue current antimicrobial therapy with Cleocin, Zosyn, vancomycin. Monitor culture data and narrow antimicrobial therapy as possible Surgical wound care per central [place] [facility].  May likely need surgical revisitation. Consider placement of PICC line but noted her creatinine is elevated 3.82 May need central line.  If central line needed for medical care will place.  AKI - presumed pre-renal from hypovolemia / dehydration.  Exacerbated by lasix + losartan.  Can not rule out component ATN.  Currently anuric. Lab Results  Component Value Date   CREATININE 3.82 (H) 06/27/2018   CREATININE 5.04 (H) 06/27/2018   CREATININE 5.59 (H) 06/26/2018   Recent Labs  Lab 06/26/18 2106 06/27/18 0416 06/27/18 2332  K 5.1 4.6 4.3    Plan:  IV fluids as needed Hold antihypertensives Avoid nephrotoxic Creatinine trending down  on 06/28/2018  Melena. Recent Labs    06/27/18 2312 06/27/18 2332  HGB 8.1* 8.8*    Plan: Transfuse for Hgb < 7. Continue protonix. GI consulted by EDP.  OSA - on nocturnal CPAP. Plan:  Continue home CPAP   Gluteal abscess  8/1 0800 post debridement  sepsis Plan: Post debridement 06/27/2018 Currently on multiple antibiotics Culture data is pending Wound is listed as 18 cm x 18 cm x 6 cm deep Most likely will need to return to the OR for further debridement. May need colostomy. Being managed by [facility] [place] [facility].  Check procal for completeness    [facility] Tamyia Minich ACNP [facility] Pager [phone] till 1 pm If no answer page [phone] 06/28/2018, 8:01 AM

## 2018-06-29 ENCOUNTER — Inpatient Hospital Stay (HOSPITAL_COMMUNITY): Payer: Medicare Other | Admitting: Certified Registered Nurse Anesthetist

## 2018-06-29 ENCOUNTER — Encounter (HOSPITAL_COMMUNITY): Payer: Self-pay | Admitting: *Deleted

## 2018-06-29 ENCOUNTER — Encounter (HOSPITAL_COMMUNITY)
Admission: EM | Disposition: A | Discharge status: Rehabilitation Facility | Payer: Self-pay | Source: Home / Self Care | Attending: Family Medicine

## 2018-06-29 ENCOUNTER — Inpatient Hospital Stay (HOSPITAL_COMMUNITY): Payer: Medicare Other

## 2018-06-29 HISTORY — PX: INCISION AND DRAINAGE ABSCESS: SHX5864

## 2018-06-29 LAB — BASIC METABOLIC PANEL
Anion gap: 17 — ABNORMAL HIGH (ref 5–15)
BUN: 81 mg/dL — ABNORMAL HIGH (ref 8–23)
CHLORIDE: 114 mmol/L — AB (ref 98–111)
CO2: 14 mmol/L — ABNORMAL LOW (ref 22–32)
Calcium: 8 mg/dL — ABNORMAL LOW (ref 8.9–10.3)
Creatinine, Ser: 2.62 mg/dL — ABNORMAL HIGH (ref 0.44–1.00)
GFR calc Af Amer: 18 mL/min — ABNORMAL LOW (ref >60)
GFR calc non Af Amer: 15 mL/min — ABNORMAL LOW (ref >60)
Glucose, Bld: 131 mg/dL — ABNORMAL HIGH (ref 70–99)
Potassium: 3.7 mmol/L (ref 3.5–5.1)
Sodium: 145 mmol/L (ref 135–145)

## 2018-06-29 LAB — GLUCOSE, CAPILLARY: GLUCOSE-CAPILLARY: 119 mg/dL — AB (ref 70–99)

## 2018-06-29 LAB — CBC
HCT: 22.4 % — ABNORMAL LOW (ref 36.0–46.0)
HEMOGLOBIN: 7.3 g/dL — AB (ref 12.0–15.0)
MCH: 30.2 pg (ref 26.0–34.0)
MCHC: 32.6 g/dL (ref 30.0–36.0)
MCV: 92.6 fL (ref 78.0–100.0)
PLATELETS: 87 10*3/uL — AB (ref 150–400)
RBC: 2.42 MIL/uL — ABNORMAL LOW (ref 3.87–5.11)
RDW: 15 % (ref 11.5–15.5)
WBC: 11.8 10*3/uL — ABNORMAL HIGH (ref 4.0–10.5)

## 2018-06-29 LAB — PHOSPHORUS: Phosphorus: 5.1 mg/dL — ABNORMAL HIGH (ref 2.5–4.6)

## 2018-06-29 LAB — URINE CULTURE

## 2018-06-29 LAB — SURGICAL PCR SCREEN
MRSA, PCR: NEGATIVE
Staphylococcus aureus: NEGATIVE

## 2018-06-29 LAB — PROCALCITONIN: Procalcitonin: 6.94 ng/mL

## 2018-06-29 LAB — VANCOMYCIN, RANDOM: Vancomycin Rm: 11

## 2018-06-29 LAB — LACTIC ACID, PLASMA: LACTIC ACID, VENOUS: 1 mmol/L (ref 0.5–1.9)

## 2018-06-29 LAB — MAGNESIUM: Magnesium: 2.1 mg/dL (ref 1.7–2.4)

## 2018-06-29 SURGERY — INCISION AND DRAINAGE, ABSCESS
Anesthesia: General | Site: Buttocks

## 2018-06-29 MED ORDER — SODIUM CHLORIDE 0.9 % IR SOLN
Status: DC | PRN
  Administered 2018-06-29: 3000 mL

## 2018-06-29 MED ORDER — PHENYLEPHRINE HCL 10 MG/ML IJ SOLN
INTRAMUSCULAR | Status: DC | PRN
  Administered 2018-06-29 (×2): 120 ug via INTRAVENOUS

## 2018-06-29 MED ORDER — LIDOCAINE 2% (20 MG/ML) 5 ML SYRINGE
INTRAMUSCULAR | Status: DC | PRN
  Administered 2018-06-29: 80 mg via INTRAVENOUS

## 2018-06-29 MED ORDER — VANCOMYCIN HCL 10 G IV SOLR
1500.0000 mg | INTRAVENOUS | Status: DC
  Administered 2018-06-29 – 2018-07-01 (×2): 1500 mg via INTRAVENOUS
  Filled 2018-06-29 (×2): qty 1500

## 2018-06-29 MED ORDER — HYDROMORPHONE HCL 1 MG/ML IJ SOLN: 0.2500 mg | INTRAMUSCULAR | Status: DC | PRN

## 2018-06-29 MED ORDER — DEXAMETHASONE SODIUM PHOSPHATE 10 MG/ML IJ SOLN
INTRAMUSCULAR | Status: DC | PRN
  Administered 2018-06-29: 5 mg via INTRAVENOUS

## 2018-06-29 MED ORDER — FENTANYL CITRATE (PF) 250 MCG/5ML IJ SOLN
INTRAMUSCULAR | Status: AC
  Filled 2018-06-29: qty 5

## 2018-06-29 MED ORDER — LIDOCAINE 2% (20 MG/ML) 5 ML SYRINGE
INTRAMUSCULAR | Status: AC
Start: 2018-06-29 — End: ?
  Filled 2018-06-29: qty 5

## 2018-06-29 MED ORDER — ONDANSETRON HCL 4 MG/2ML IJ SOLN
INTRAMUSCULAR | Status: DC | PRN
  Administered 2018-06-29: 4 mg via INTRAVENOUS

## 2018-06-29 MED ORDER — ONDANSETRON HCL 4 MG/2ML IJ SOLN
INTRAMUSCULAR | Status: AC
  Filled 2018-06-29: qty 2

## 2018-06-29 MED ORDER — FENTANYL CITRATE (PF) 250 MCG/5ML IJ SOLN
INTRAMUSCULAR | Status: DC | PRN
  Administered 2018-06-29: 25 ug via INTRAVENOUS

## 2018-06-29 MED ORDER — OXYCODONE HCL 5 MG/5ML PO SOLN: 5.0000 mg | Freq: Once | ORAL | Status: DC | PRN

## 2018-06-29 MED ORDER — 0.9 % SODIUM CHLORIDE (POUR BTL) OPTIME
TOPICAL | Status: DC | PRN
  Administered 2018-06-29: 1000 mL

## 2018-06-29 MED ORDER — PROPOFOL 10 MG/ML IV BOLUS
INTRAVENOUS | Status: AC
  Filled 2018-06-29: qty 20

## 2018-06-29 MED ORDER — EPHEDRINE 5 MG/ML INJ
INTRAVENOUS | Status: AC
  Filled 2018-06-29: qty 10

## 2018-06-29 MED ORDER — PHENYLEPHRINE HCL 10 MG/ML IJ SOLN
INTRAMUSCULAR | Status: DC | PRN
  Administered 2018-06-29: 25 ug/min via INTRAVENOUS

## 2018-06-29 MED ORDER — PROMETHAZINE HCL 25 MG/ML IJ SOLN: 6.2500 mg | INTRAMUSCULAR | Status: DC | PRN

## 2018-06-29 MED ORDER — PHENYLEPHRINE HCL 10 MG/ML IJ SOLN
INTRAMUSCULAR | Status: AC
  Filled 2018-06-29: qty 1

## 2018-06-29 MED ORDER — LACTATED RINGERS IV SOLN
INTRAVENOUS | Status: DC | PRN
  Administered 2018-06-29: 12:00:00 via INTRAVENOUS

## 2018-06-29 MED ORDER — OXYCODONE HCL 5 MG PO TABS: 5.0000 mg | ORAL_TABLET | Freq: Once | ORAL | Status: DC | PRN

## 2018-06-29 MED ORDER — BUPIVACAINE-EPINEPHRINE 0.25% -1:200000 IJ SOLN
INTRAMUSCULAR | Status: DC | PRN
  Administered 2018-06-29: 30 mL

## 2018-06-29 MED ORDER — BUPIVACAINE-EPINEPHRINE (PF) 0.25% -1:200000 IJ SOLN
INTRAMUSCULAR | Status: AC
  Filled 2018-06-29: qty 30

## 2018-06-29 MED ORDER — PROPOFOL 10 MG/ML IV BOLUS
INTRAVENOUS | Status: DC | PRN
  Administered 2018-06-29: 160 mg via INTRAVENOUS

## 2018-06-29 SURGICAL SUPPLY — 29 items
BNDG GAUZE ELAST 4 BULKY (GAUZE/BANDAGES/DRESSINGS) ×2 IMPLANT
CANISTER SUCT 3000ML PPV (MISCELLANEOUS) ×3 IMPLANT
COVER SURGICAL LIGHT HANDLE (MISCELLANEOUS) ×3 IMPLANT
DRAPE LAPAROSCOPIC ABDOMINAL (DRAPES) ×3 IMPLANT
DRAPE UTILITY XL STRL (DRAPES) ×6 IMPLANT
DRSG PAD ABDOMINAL 8X10 ST (GAUZE/BANDAGES/DRESSINGS) ×4 IMPLANT
ELECT CAUTERY BLADE 6.4 (BLADE) ×3 IMPLANT
ELECT REM PT RETURN 9FT ADLT (ELECTROSURGICAL) ×3
ELECTRODE REM PT RTRN 9FT ADLT (ELECTROSURGICAL) ×1 IMPLANT
GAUZE SPONGE 4X4 12PLY STRL (GAUZE/BANDAGES/DRESSINGS) IMPLANT
GLOVE BIO SURGEON STRL SZ8 (GLOVE) ×3 IMPLANT
GLOVE BIOGEL PI IND STRL 8 (GLOVE) ×1 IMPLANT
GLOVE BIOGEL PI INDICATOR 8 (GLOVE) ×2
GLOVE SS BIOGEL STRL SZ 7 (GLOVE) IMPLANT
GLOVE SUPERSENSE BIOGEL SZ 7 (GLOVE) ×2
GOWN STRL REUS W/ TWL LRG LVL3 (GOWN DISPOSABLE) ×1 IMPLANT
GOWN STRL REUS W/ TWL XL LVL3 (GOWN DISPOSABLE) ×1 IMPLANT
GOWN STRL REUS W/TWL LRG LVL3 (GOWN DISPOSABLE) ×3
GOWN STRL REUS W/TWL XL LVL3 (GOWN DISPOSABLE) ×3
KIT BASIN OR (CUSTOM PROCEDURE TRAY) ×3 IMPLANT
KIT TURNOVER KIT B (KITS) ×3 IMPLANT
NS IRRIG 1000ML POUR BTL (IV SOLUTION) ×3 IMPLANT
PACK GENERAL/GYN (CUSTOM PROCEDURE TRAY) ×3 IMPLANT
PAD ARMBOARD 7.5X6 YLW CONV (MISCELLANEOUS) ×6 IMPLANT
SPECIMEN JAR SMALL (MISCELLANEOUS) IMPLANT
SWAB COLLECTION DEVICE MRSA (MISCELLANEOUS) IMPLANT
SWAB CULTURE ESWAB REG 1ML (MISCELLANEOUS) IMPLANT
TOWEL OR 17X24 6PK STRL BLUE (TOWEL DISPOSABLE) ×3 IMPLANT
TOWEL OR 17X26 10 PK STRL BLUE (TOWEL DISPOSABLE) ×3 IMPLANT

## 2018-06-29 NOTE — Progress Notes (Signed)
Pharmacy Antibiotic Note  [name] is a 84 y.o. female admitted on 06/26/2018 with necrotizing fascitis .  Pt may need to return to OR for wound control. Pt no longer on pressors/in shock  Vanc Random this am 11 - renal fx slightly better  Plan: Zosyn 2.25g IV q8h Vanc 1500 mg q8h Clindamycin per MD - dc? Monitor renal fx cx vanc lvls prn  Height: 5\' 4"  (162.6 cm) Weight: 235 lb 10.8 oz (106.9 kg) IBW/kg (Calculated) : 54.7  Temp (24hrs), Avg:97.9 F (36.6 C), Min:97.6 F (36.4 C), Max:98.1 F (36.7 C)  Recent Labs  Lab 06/26/18 2106 06/26/18 2140 06/26/18 2332 06/27/18 0416 06/27/18 1624 06/27/18 2312 06/27/18 2332 06/28/18 0436 06/28/18 1518 06/29/18 0432  WBC 16.1*  --   --  13.0* 8.0  --  8.2  --  8.7 11.8*  CREATININE 5.59*  --   --  5.04*  --   --  3.82*  --   --  2.62*  LATICACIDVEN  --  2.96* 1.87  --   --  3.0*  --  2.5*  --   --   VANCORANDOM  --   --   --   --   --   --   --   --   --  11    Estimated Creatinine Clearance: 17.7 mL/min (A) (by C-G formula based on SCr of 2.62 mg/dL (H)).    Allergies  Allergen Reactions  . Morphine And Related Hives and Itching  . Gabapentin Other (See Comments)    Alters mood  . Lipitor [Atorvastatin] Itching and Rash  . Lyrica [Pregabalin] Other (See Comments)    Lethargic, mood changes  . Sulfa Antibiotics Rash   Isaac BlissMichael Wendelyn Kiesling, PharmD, BCPS, BCCCP Clinical Pharmacist [phone]  Please check AMION for all [facility] numbers  06/29/2018 8:06 AM

## 2018-06-29 NOTE — Transfer of Care (Signed)
Immediate Anesthesia Transfer of Care Note  Patient: Rebecca KayGracie B Thomason  Procedure(s) Performed: REPEAT INCISION AND DRAINAGE  BUTTOCK ABSCESS (N/A Buttocks)  Patient Location: PACU  Anesthesia Type:General  Level of Consciousness: responds to stimulation, cooperative   Airway & Oxygen Therapy: Patient Spontanous Breathing  Post-op Assessment: Report given to RN and Post -op Vital signs reviewed and stable  Post vital signs: Reviewed and stable  Last Vitals:  Vitals Value Taken Time  BP 103/51 06/29/2018 12:54 PM  Temp    Pulse 34 06/29/2018 12:55 PM  Resp 21 06/29/2018 12:55 PM  SpO2 98 % 06/29/2018 12:55 PM  Vitals shown include unvalidated device data.  Last Pain:  Vitals:   06/29/18 0823  TempSrc: Oral  PainSc:          Complications: No apparent anesthesia complications

## 2018-06-29 NOTE — Plan of Care (Signed)
  Problem: Education: Goal: Knowledge of General Education information will improve Description Including pain rating scale, medication(s)/side effects and non-pharmacologic comfort measures Outcome: Progressing   Problem: Health Behavior/Discharge Planning: Goal: Ability to manage health-related needs will improve Outcome: Progressing   Problem: Clinical Measurements: Goal: Ability to maintain clinical measurements within normal limits will improve Outcome: Not Progressing Goal: Will remain free from infection Outcome: Not Progressing Goal: Diagnostic test results will improve Outcome: Not Progressing Goal: Respiratory complications will improve Outcome: Progressing Goal: Cardiovascular complication will be avoided Outcome: Progressing

## 2018-06-29 NOTE — Interval H&P Note (Signed)
History and Physical Interval Note:  06/29/2018 11:20 AM  [name]  has presented today for surgery, with the diagnosis of Fournier's gangrene  The various methods of treatment have been discussed with the patient and family. After consideration of risks, benefits and other options for treatment, the patient has consented to  Procedure(s): REPEAT INCISION AND DRAINAGE  BUTTOCK ABSCESS (N/A) as a surgical intervention .  The patient's history has been reviewed, patient examined, no change in status, stable for surgery.  I have reviewed the patient's chart and labs.  Questions were answered to the patient's satisfaction.     [name] A Derry Arbogast

## 2018-06-29 NOTE — Progress Notes (Signed)
PROGRESS NOTE    [name]  ZOX:[id number] DOB: [birth year] DOA: 06/26/2018 PCP: Kirby Funk, MD      Brief Narrative:  [name] is a 84 y.o. F with HTN, hypothyroidism who presents with 1 week buttock pain, followed by malaise, diarrhea, and weakness.    Went to UC, where she was told she was "dehydrated".  Came to the ER, where she was found to have BP 80/40, leukocytosis, Cr 5.5, and lactic acidosis.  Given 30 cc/kg fluids, lactate cleared, BP improved somewhat, she was mentating well with MAP>60.  Started on ceftriaxone for presumed UTI.  CCM were consulted, recommended continued medical therapy with fluids, antibiotics.  CT abdomen showed ?enteritis, also buttock cellulitis.  Buttock examination showed cellulitis with necrosis.     Assessment & Plan:  Sepsis Buttock cellulitis with necrosis Debrided 7/31 and plans for again today 8/2.  Loop colostomy is planned.  BP good overnight.  Lactate cleared.  Cultures no specific pathogen. -Continue vancomycin, Zosyn, clindamycin -Follow cultures -Consult to general surgery, appreciate expert cares    Acute renal failure on chronic kidney disease stage III Baseline creatinine 1.4-1.5.  Admitted with creatinine 5.5, improved again considerably overnight.  Urine output almost 100 cc/h yesterday. -Avoid nephrotoxins -Hold home Lasix -Continue IV fluids -Trend creatinine   Chronic normocytic anemia Possible acute blood loss anemia There is talk of a diverting colostomy, per patient neurosurgery notes also suggest SP tube or Foley chronically.    Low suspicion for GI bleed.  Hemoglobin at baseline 10-11.  Hemoglobin trending down again.  FOBT negative. -Trend CBC -Continue famotidine for stress ulcer PPx, while patient is in ICU, not for clinical supicion of GI bleed -Transfusion threshold 7 g/dL -Consult gastroenterology, appreciate recommendations  Enteritis This is a nonspecific finding on CT.  Monitor  stools.  Hypothyroidism -Continue levothyroxine  Hypertension Hypotensive at admission -Hold home BP meds  Other medications -Continue allopurinol, Zoloft     DVT prophylaxis: SCDs Code Status: DO NOT RESUSCITATE Family Communication: Daughter at the bedside MDM and disposition Plan: Below labs and imaging reports were reviewed and summarized above.  The patient was admitted with sepsis, found to be from a buttock cellulitis with necrosis and abscess.  General surgery were consulted and debrided this once and plan to go back to [facility] today.  She was briefly on pressors for septic shock after the first debridement.  She also has renal failure which is improving compatible certainly.  She has a new anemia which may require transfusion.  Gastroenterology were consulted, but we will have a low suspicion for GI bleed.  Will require further debridement in the operating room, as well as possible diverting colostomy and SP catheter.  Once surgical treatment is finished, we will evaluate with PT, likely will need placement.      Consultants:   GI  Nephrology  General Surgery  Procedures:   None  Antimicrobials:   Ceftriaxone 7/31 >> 7/31  Vancomycin 7/31 >>  Flagyl 7/31 >> 7/31  Clindamycin 7/31 >>  Zosyn 7/31 >>   Cultures:   7/30 Blood cultures x2: No results  7/30 Urine culture: GNRs  7/31 intraoperative culture: NGTD      Subjective: No vomiting, fever.  She is overall very tired, but no focal complaints like cough, sputum production, abdominal pain, vomiting, diarrhea, dysuria, flank pain.  Objective: Vitals:   06/29/18 1423 06/29/18 1430 06/29/18 1431 06/29/18 1453  BP: (!) 103/51   (!) 104/59  Pulse: (!) 35 Marland Kitchen)  31 (!) 35 73  Resp: 19 16 (!) 24 (!) 23  Temp:  97.6 F (36.4 C)  97.6 F (36.4 C)  TempSrc:    Oral  SpO2: 98% 99% 100% 95%  Weight:      Height:        Intake/Output Summary (Last 24 hours) at 06/29/2018 1921 Last data filed at  06/29/2018 1800 Gross per 24 hour  Intake 1876.58 ml  Output 1420 ml  Net 456.58 ml   Filed Weights   06/28/18 0000 06/28/18 0500 06/29/18 0500  Weight: 104.8 kg (231 lb 0.7 oz) 104 kg (229 lb 4.5 oz) 106.9 kg (235 lb 10.8 oz)    Examination: General appearance: Obese adult female, lying in bed, interactive but very sluggish and tired. HEENT: Anicteric, conjunctival pink, lids and lashes normal.  No nasal deformity, discharge, or epistaxis.  Lips moist, oropharynx moist, without oral lesions.  Teeth are dyed from red Jell-O.  Hearing normal. Skin: Skin warm and dry, dressing and buttock wound not examined Cardiac: Regular rate and rhythm, no murmurs, no lower extremity edema Respiratory: Aspiration shallow, but otherwise respiratory rate and rhythm are normal.  Lungs clear without rales or wheezes. Abdomen: Abdomen soft with no focal tenderness, no voluntary guarding. MSK: No deformities or effusions. Neuro: Awake and alert but sluggish, extraocular movements intact, upper extremity movements symmetric and globally weak however.  Speech fluent. Psych: Sensorium intact and responding to questions, attention diminished, affect blunted, judgment and insight appear normal.   Data Reviewed: I have personally reviewed following labs and imaging studies:  CBC: Recent Labs  Lab 06/27/18 0416 06/27/18 1624 06/27/18 2312 06/27/18 2332 06/28/18 1518 06/29/18 0432  WBC 13.0* 8.0  --  8.2 8.7 11.8*  HGB 9.0* 9.5* 8.1* 8.8* 8.0* 7.3*  HCT 28.7* 28.7* 25.2* 29.0* 25.0* 22.4*  MCV 95.0 93.2  --  100.7* 94.0 92.6  PLT 119* 124*  --  107* 104* 87*   Basic Metabolic Panel: Recent Labs  Lab 06/26/18 2106 06/27/18 0003 06/27/18 0416 06/27/18 2332 06/29/18 0432  NA 136  --  141 143 145  K 5.1  --  4.6 4.3 3.7  CL 99  --  107 112* 114*  CO2 18*  --  21* 16* 14*  GLUCOSE 112*  --  85 103* 131*  BUN 100*  --  96* 94* 81*  CREATININE 5.59*  --  5.04* 3.82* 2.62*  CALCIUM 8.5*  --  7.8*  7.5* 8.0*  MG  --  2.1  --  2.0 2.1  PHOS  --   --   --   --  5.1*   GFR: Estimated Creatinine Clearance: 17.7 mL/min (A) (by C-G formula based on SCr of 2.62 mg/dL (H)). Liver Function Tests: Recent Labs  Lab 06/26/18 2106 06/27/18 0416  AST 22 19  ALT 13 12  ALKPHOS 131* 101  BILITOT 1.6* 1.3*  PROT 6.1* 5.3*  ALBUMIN 2.7* 2.2*   No results for input(s): LIPASE, AMYLASE in the last 168 hours. No results for input(s): AMMONIA in the last 168 hours. Coagulation Profile: Recent Labs  Lab 06/26/18 2106  INR 1.24   Cardiac Enzymes: Recent Labs  Lab 06/27/18 0003 06/27/18 0416 06/27/18 1039  TROPONINI <0.03 <0.03 0.03*   BNP (last 3 results) No results for input(s): PROBNP in the last 8760 hours. HbA1C: No results for input(s): HGBA1C in the last 72 hours. CBG: Recent Labs  Lab 06/27/18 2228 06/29/18 0936  GLUCAP 98 119*  Lipid Profile: No results for input(s): CHOL, HDL, LDLCALC, TRIG, CHOLHDL, LDLDIRECT in the last 72 hours. Thyroid Function Tests: No results for input(s): TSH, T4TOTAL, FREET4, T3FREE, THYROIDAB in the last 72 hours. Anemia Panel: No results for input(s): VITAMINB12, FOLATE, FERRITIN, TIBC, IRON, RETICCTPCT in the last 72 hours. Urine analysis:    Component Value Date/Time   COLORURINE YELLOW 06/27/2018 0043   APPEARANCEUR HAZY (A) 06/27/2018 0043   LABSPEC 1.012 06/27/2018 0043   PHURINE 5.0 06/27/2018 0043   GLUCOSEU NEGATIVE 06/27/2018 0043   HGBUR NEGATIVE 06/27/2018 0043   BILIRUBINUR NEGATIVE 06/27/2018 0043   KETONESUR NEGATIVE 06/27/2018 0043   PROTEINUR NEGATIVE 06/27/2018 0043   NITRITE NEGATIVE 06/27/2018 0043   LEUKOCYTESUR SMALL (A) 06/27/2018 0043   Sepsis Labs: @LABRCNTIP (procalcitonin:4,lacticacidven:4)  ) Recent Results (from the past 240 hour(s))  Urine Culture     Status: Abnormal   Collection Time: 06/27/18 12:43 AM  Result Value Ref Range Status   Specimen Description URINE, RANDOM  Final   Special  Requests   Final    NONE Performed at [facility], [address] [place], [place] [address]    Culture >=100,000 COLONIES/mL ENTEROBACTER CLOACAE (A)  Final   Report Status 06/29/2018 FINAL  Final   Organism ID, Bacteria ENTEROBACTER CLOACAE (A)  Final      Susceptibility   Enterobacter cloacae - MIC*    CEFAZOLIN >=64 RESISTANT Resistant     CEFTRIAXONE 2 SENSITIVE Sensitive     CIPROFLOXACIN <=0.25 SENSITIVE Sensitive     GENTAMICIN <=1 SENSITIVE Sensitive     IMIPENEM <=0.25 SENSITIVE Sensitive     NITROFURANTOIN 64 INTERMEDIATE Intermediate     TRIMETH/SULFA <=20 SENSITIVE Sensitive     PIP/TAZO 8 SENSITIVE Sensitive     * >=100,000 COLONIES/mL ENTEROBACTER CLOACAE  MRSA PCR Screening     Status: None   Collection Time: 06/27/18  3:16 AM  Result Value Ref Range Status   MRSA by PCR NEGATIVE NEGATIVE Final    Comment:        The GeneXpert MRSA Assay (FDA approved for NASAL specimens only), is one component of a comprehensive MRSA colonization surveillance program. It is not intended to diagnose MRSA infection nor to guide or monitor treatment for MRSA infections. Performed at [facility], [address] [place], [place] [address]   Culture, blood (Routine X 2) w Reflex to ID Panel     Status: None (Preliminary result)   Collection Time: 06/27/18  4:05 AM  Result Value Ref Range Status   Specimen Description BLOOD LEFT ANTECUBITAL  Final   Special Requests IN PEDIATRIC BOTTLE Blood Culture adequate volume  Final   Culture   Final    NO GROWTH 2 DAYS Performed at [facility], [address] [place], [place] [address]    Report Status PENDING  Incomplete  Culture, blood (Routine X 2) w Reflex to ID Panel     Status: None (Preliminary result)   Collection Time: 06/27/18  4:20 AM  Result Value Ref Range Status   Specimen Description BLOOD LEFT HAND  Final   Special Requests IN PEDIATRIC BOTTLE Blood Culture adequate volume  Final   Culture    Final    NO GROWTH 2 DAYS Performed at [facility], [address] [place], [place] [address]    Report Status PENDING  Incomplete  Aerobic/Anaerobic Culture (surgical/deep wound)     Status: None (Preliminary result)   Collection Time: 06/27/18  6:01 PM  Result Value Ref Range Status   Specimen Description ABSCESS  Final   Special Requests NONE  Final   Gram Stain   Final    NO WBC SEEN MODERATE GRAM POSITIVE COCCI IN PAIRS MODERATE GRAM NEGATIVE COCCOBACILLI Performed at [facility], [address] [place], [place] [address]    Culture   Final    MODERATE UNIDENTIFIED ORGANISM NO ANAEROBES ISOLATED; CULTURE IN PROGRESS FOR 5 DAYS    Report Status PENDING  Incomplete  Surgical pcr screen     Status: None   Collection Time: 06/29/18  8:56 AM  Result Value Ref Range Status   MRSA, PCR NEGATIVE NEGATIVE Final   Staphylococcus aureus NEGATIVE NEGATIVE Final    Comment: (NOTE) The Xpert SA Assay (FDA approved for NASAL specimens in patients 64 years of age and older), is one component of a comprehensive surveillance program. It is not intended to diagnose infection nor to guide or monitor treatment. Performed at [facility], [address] [place], [place] [address]          Radiology Studies: Dg Chest Port 1 View  Result Date: 06/29/2018 CLINICAL DATA:  Respiratory failure. EXAM: PORTABLE CHEST 1 VIEW COMPARISON:  One-view chest x-ray 06/27/2018. FINDINGS: The heart is enlarged. Aortic atherosclerosis is present. There is no edema or effusion. Pulmonary vascular congestion is stable. No significant airspace consolidation is present. IMPRESSION: 1. Stable cardiomegaly and mild pulmonary vascular congestion. 2. Aortic atherosclerosis. Electronically Signed   By: Marin Roberts M.D.   On: 06/29/2018 07:38        Scheduled Meds: . allopurinol  100 mg Oral Daily  . levothyroxine  137 mcg Oral QAC breakfast  . sertraline  50 mg Oral Daily    Continuous Infusions: . sodium chloride 100 mL/hr at 06/29/18 1501  . clindamycin (CLEOCIN) IV Stopped (06/29/18 0981)  . famotidine (PEPCID) IV Stopped (06/28/18 1541)  . piperacillin-tazobactam (ZOSYN)  IV 100 mL/hr at 06/29/18 0518  . vancomycin 250 mL/hr at 06/29/18 0900     LOS: 3 days    Time spent: 25 minutes    Alberteen Sam, MD [facility] 06/29/2018, 7:21 PM     Pager [phone] --- please page though AMION:  [email or web address] Password [email or web address] If 7PM-7AM, please contact night-coverage

## 2018-06-29 NOTE — Progress Notes (Signed)
Pt arrived to 4e from PACU. Vitals obtained and telemetry box applied. CCMD notified. Pt transferred to air-loss mattress. Pt oriented to room and staff. Family at bedside. Will continue current plan of care.   Ardeen JourdainLauren Dalayla Aldredge BSN, RN

## 2018-06-29 NOTE — Progress Notes (Signed)
PT Cancellation Note  Patient Details Name: [name] MRN: [id number] DOB: 03/23/1928   Cancelled Treatment:    Reason Eval/Treat Not Completed: (P) Patient at procedure or test/unavailable Pt being taken to the OR today for further surgical debridement. Please advise if further hydrotherapy is warranted post surgery. Thanks.  Panhia Karl B. Beverely RisenVan Fleet PT, DPT Acute Rehabilitation  [phone] Pager [phone]) [phone]  Elon Alaslizabeth B Van Fleet 06/29/2018, 9:03 AM

## 2018-06-29 NOTE — Progress Notes (Signed)
Pt has a home CPAP.  Pt does not need assistance.  RT did check chamber and added some sterile water.

## 2018-06-29 NOTE — H&P (View-Only) (Signed)
2 Days Post-Op   Subjective/Chief Complaint: Awake and alert  Ok for colostomy once more stable since she has severe fecal and urine incontinence    Objective: Vital signs in last 24 hours: Temp:  [97.8 F (36.6 C)-98.1 F (36.7 C)] 98.1 F (36.7 C) (08/02 0400) Pulse Rate:  [36-83] 37 (08/02 0700) Resp:  [15-39] 25 (08/02 0700) BP: (83-115)/(31-72) 108/47 (08/02 0700) SpO2:  [92 %-100 %] 100 % (08/02 0700) Weight:  [106.9 kg (235 lb 10.8 oz)] 106.9 kg (235 lb 10.8 oz) (08/02 0500) Last BM Date: 06/26/18  Intake/Output from previous day: 08/01 0701 - 08/02 0700 In: 3462.8 [P.O.:600; I.V.:2429.2; IV Piggyback:433.6] Out: 1655 [Urine:1655] Intake/Output this shift: No intake/output data recorded.  see picture in epic   Lab Results:  Recent Labs    06/28/18 1518 06/29/18 0432  WBC 8.7 11.8*  HGB 8.0* 7.3*  HCT 25.0* 22.4*  PLT 104* 87*   BMET Recent Labs    06/27/18 2332 06/29/18 0432  NA 143 145  K 4.3 3.7  CL 112* 114*  CO2 16* 14*  GLUCOSE 103* 131*  BUN 94* 81*  CREATININE 3.82* 2.62*  CALCIUM 7.5* 8.0*   PT/INR Recent Labs    06/26/18 2106  LABPROT 15.5*  INR 1.24   ABG No results for input(s): PHART, HCO3 in the last 72 hours.  Invalid input(s): PCO2, PO2  Studies/Results: Dg Chest Port 1 View  Result Date: 06/29/2018 CLINICAL DATA:  Respiratory failure. EXAM: PORTABLE CHEST 1 VIEW COMPARISON:  One-view chest x-ray 06/27/2018. FINDINGS: The heart is enlarged. Aortic atherosclerosis is present. There is no edema or effusion. Pulmonary vascular congestion is stable. No significant airspace consolidation is present. IMPRESSION: 1. Stable cardiomegaly and mild pulmonary vascular congestion. 2. Aortic atherosclerosis. Electronically Signed   By: Marin Roberts M.D.   On: 06/29/2018 07:38    Anti-infectives: Anti-infectives (From admission, onward)   Start     Dose/Rate Route Frequency Ordered Stop   06/29/18 0900  vancomycin (VANCOCIN)  1,500 mg in sodium chloride 0.9 % 500 mL IVPB     1,500 mg 250 mL/hr over 120 Minutes Intravenous Every 48 hours 06/29/18 0805     06/28/18 1055  vancomycin variable dose per unstable renal function (pharmacist dosing)  Status:  Discontinued      Does not apply See admin instructions 06/28/18 1055 06/29/18 0805   06/27/18 2345  piperacillin-tazobactam (ZOSYN) IVPB 2.25 g     2.25 g 100 mL/hr over 30 Minutes Intravenous Every 8 hours 06/27/18 2336     06/27/18 2315  vancomycin (VANCOCIN) IVPB 750 mg/150 ml premix  Status:  Discontinued    Note to Pharmacy:  Pharmacy to dose and monitor levels as appropriate for necrotizing fascitis   750 mg 150 mL/hr over 60 Minutes Intravenous Every 12 hours 06/27/18 2303 06/27/18 2329   06/27/18 2315  clindamycin (CLEOCIN) IVPB 600 mg     600 mg 100 mL/hr over 30 Minutes Intravenous Every 8 hours 06/27/18 2309     06/27/18 1215  vancomycin (VANCOCIN) 1,500 mg in sodium chloride 0.9 % 500 mL IVPB     1,500 mg 250 mL/hr over 120 Minutes Intravenous  Once 06/27/18 1142 06/27/18 1500   06/27/18 1100  metroNIDAZOLE (FLAGYL) IVPB 500 mg  Status:  Discontinued     500 mg 100 mL/hr over 60 Minutes Intravenous Every 8 hours 06/27/18 1052 06/27/18 2303   06/27/18 0200  cefTRIAXone (ROCEPHIN) 1 g in sodium chloride 0.9 % 100 mL IVPB  Status:  Discontinued     1 g 200 mL/hr over 30 Minutes Intravenous Every 24 hours 06/27/18 0138 06/27/18 2258      Assessment/Plan: s/p Procedure(s): IRRIGATION AND DEBRIDEMENT BUTTOCK ABSCESS (Left) AKI on CKD stage III - Cr 3.82, improving Acute on Chronic normocytic anemia - H/H 8.8/29, per primary service Enteritis - nonspecific finding on CT Hypothyroidism - home meds HTN - holding home meds for hypoTN Obesity - BMI 38.52 OSA  Sepsis- likely due to below - BP soft, on neo L buttock Fournier's Gangrene - WBC 8.2, afebrile, lactic trending down  - cxs pending, stain shows G+ cocci and G- coccobacilli - continue  broad spectrum IV abx - patient is incontinent - will likely need diverting colostomy, either SP tube or foley chronically  - OR  today  - may at some point also benefit from hydrotherapy +/- a few days of Dakin's as well - BID dressing changes with prn changes when soiled.   FEN:CLD, IVF VTE: SCDs ID: vanc x1 7/31; rocephin/flagyl/cleocin 7/31>> Foley: present   The procedure has been discussed with the patient.  Alternative therapies have been discussed with the patient.  Operative risks include bleeding,  Infection,  Organ injury,  Nerve injury,  Blood vessel injury,  DVT,  Pulmonary embolism,  Death,  And possible reoperation.  Medical management risks include worsening of present situation.  The success of the procedure is 50 -90 % at treating patients symptoms.  The patient understands and agrees to proceed.   LOS: 3 days    [name] A [name] 06/29/2018

## 2018-06-29 NOTE — Care Management Note (Signed)
Case Management Note  Patient Details  Name: [name] MRN: [id number] Date of Birth: 11/03/1928  Subjective/Objective:     From home, presents with sepsis secondary to l buttock fourniers gangrene, aki, ckd stage 3, acute/chronic anemia, enteritis, obesity, osa, for I and D today,  Will need colostomy when more stable, and hydrotherapy.               Action/Plan: NCM will follow for transition of care needs.   Expected Discharge Date:                  Expected Discharge Plan:     In-House Referral:     Discharge planning Services  CM Consult  Post Acute Care Choice:    Choice offered to:     DME Arranged:    DME Agency:     HH Arranged:    HH Agency:     Status of Service:  In process, will continue to follow  If discussed at Long Length of Stay Meetings, dates discussed:    Additional Comments:  Leone Havenaylor, Hattie [place], RN 06/29/2018, 2:39 PM

## 2018-06-29 NOTE — Anesthesia Postprocedure Evaluation (Signed)
Anesthesia Post Note  Patient: [name]  Procedure(s) Performed: REPEAT INCISION AND DRAINAGE  BUTTOCK ABSCESS (N/A Buttocks)     Patient location during evaluation: PACU Anesthesia Type: General Level of consciousness: awake and alert Pain management: pain level controlled Vital Signs Assessment: post-procedure vital signs reviewed and stable Respiratory status: spontaneous breathing, nonlabored ventilation and respiratory function stable Cardiovascular status: blood pressure returned to baseline and stable Postop Assessment: no apparent nausea or vomiting Anesthetic complications: no    Last Vitals:  Vitals:   06/29/18 1400 06/29/18 1408  BP:  (!) 104/49  Pulse: 70 (!) 34  Resp: 19 (!) 21  Temp:    SpO2: 97% 98%    Last Pain:  Vitals:   06/29/18 1400  TempSrc:   PainSc: 0-No pain                 Lowella CurbWarren Ray Marlissa Emerick

## 2018-06-29 NOTE — Progress Notes (Signed)
2 Days Post-Op   Subjective/Chief Complaint: Awake and alert  Ok for colostomy once more stable since she has severe fecal and urine incontinence    Objective: Vital signs in last 24 hours: Temp:  [97.8 F (36.6 C)-98.1 F (36.7 C)] 98.1 F (36.7 C) (08/02 0400) Pulse Rate:  [36-83] 37 (08/02 0700) Resp:  [15-39] 25 (08/02 0700) BP: (83-115)/(31-72) 108/47 (08/02 0700) SpO2:  [92 %-100 %] 100 % (08/02 0700) Weight:  [106.9 kg (235 lb 10.8 oz)] 106.9 kg (235 lb 10.8 oz) (08/02 0500) Last BM Date: 06/26/18  Intake/Output from previous day: 08/01 0701 - 08/02 0700 In: 3462.8 [P.O.:600; I.V.:2429.2; IV Piggyback:433.6] Out: 1655 [Urine:1655] Intake/Output this shift: No intake/output data recorded.  see picture in epic   Lab Results:  Recent Labs    06/28/18 1518 06/29/18 0432  WBC 8.7 11.8*  HGB 8.0* 7.3*  HCT 25.0* 22.4*  PLT 104* 87*   BMET Recent Labs    06/27/18 2332 06/29/18 0432  NA 143 145  K 4.3 3.7  CL 112* 114*  CO2 16* 14*  GLUCOSE 103* 131*  BUN 94* 81*  CREATININE 3.82* 2.62*  CALCIUM 7.5* 8.0*   PT/INR Recent Labs    06/26/18 2106  LABPROT 15.5*  INR 1.24   ABG No results for input(s): PHART, HCO3 in the last 72 hours.  Invalid input(s): PCO2, PO2  Studies/Results: Dg Chest Port 1 View  Result Date: 06/29/2018 CLINICAL DATA:  Respiratory failure. EXAM: PORTABLE CHEST 1 VIEW COMPARISON:  One-view chest x-ray 06/27/2018. FINDINGS: The heart is enlarged. Aortic atherosclerosis is present. There is no edema or effusion. Pulmonary vascular congestion is stable. No significant airspace consolidation is present. IMPRESSION: 1. Stable cardiomegaly and mild pulmonary vascular congestion. 2. Aortic atherosclerosis. Electronically Signed   By: Christopher  Mattern M.D.   On: 06/29/2018 07:38    Anti-infectives: Anti-infectives (From admission, onward)   Start     Dose/Rate Route Frequency Ordered Stop   06/29/18 0900  vancomycin (VANCOCIN)  1,500 mg in sodium chloride 0.9 % 500 mL IVPB     1,500 mg 250 mL/hr over 120 Minutes Intravenous Every 48 hours 06/29/18 0805     06/28/18 1055  vancomycin variable dose per unstable renal function (pharmacist dosing)  Status:  Discontinued      Does not apply See admin instructions 06/28/18 1055 06/29/18 0805   06/27/18 2345  piperacillin-tazobactam (ZOSYN) IVPB 2.25 g     2.25 g 100 mL/hr over 30 Minutes Intravenous Every 8 hours 06/27/18 2336     06/27/18 2315  vancomycin (VANCOCIN) IVPB 750 mg/150 ml premix  Status:  Discontinued    Note to Pharmacy:  Pharmacy to dose and monitor levels as appropriate for necrotizing fascitis   750 mg 150 mL/hr over 60 Minutes Intravenous Every 12 hours 06/27/18 2303 06/27/18 2329   06/27/18 2315  clindamycin (CLEOCIN) IVPB 600 mg     600 mg 100 mL/hr over 30 Minutes Intravenous Every 8 hours 06/27/18 2309     06/27/18 1215  vancomycin (VANCOCIN) 1,500 mg in sodium chloride 0.9 % 500 mL IVPB     1,500 mg 250 mL/hr over 120 Minutes Intravenous  Once 06/27/18 1142 06/27/18 1500   06/27/18 1100  metroNIDAZOLE (FLAGYL) IVPB 500 mg  Status:  Discontinued     500 mg 100 mL/hr over 60 Minutes Intravenous Every 8 hours 06/27/18 1052 06/27/18 2303   06/27/18 0200  cefTRIAXone (ROCEPHIN) 1 g in sodium chloride 0.9 % 100 mL IVPB    Status:  Discontinued     1 g 200 mL/hr over 30 Minutes Intravenous Every 24 hours 06/27/18 0138 06/27/18 2258      Assessment/Plan: s/p Procedure(s): IRRIGATION AND DEBRIDEMENT BUTTOCK ABSCESS (Left) AKI on CKD stage III - Cr 3.82, improving Acute on Chronic normocytic anemia - H/H 8.8/29, per primary service Enteritis - nonspecific finding on CT Hypothyroidism - home meds HTN - holding home meds for hypoTN Obesity - BMI 38.52 OSA  Sepsis- likely due to below - BP soft, on neo L buttock Fournier's Gangrene - WBC 8.2, afebrile, lactic trending down  - cxs pending, stain shows G+ cocci and G- coccobacilli - continue  broad spectrum IV abx - patient is incontinent - will likely need diverting colostomy, either SP tube or foley chronically  - OR  today  - may at some point also benefit from hydrotherapy +/- a few days of Dakin's as well - BID dressing changes with prn changes when soiled.   FEN:CLD, IVF VTE: SCDs ID: vanc x1 7/31; rocephin/flagyl/cleocin 7/31>> Foley: present   The procedure has been discussed with the patient.  Alternative therapies have been discussed with the patient.  Operative risks include bleeding,  Infection,  Organ injury,  Nerve injury,  Blood vessel injury,  DVT,  Pulmonary embolism,  Death,  And possible reoperation.  Medical management risks include worsening of present situation.  The success of the procedure is 50 -90 % at treating patients symptoms.  The patient understands and agrees to proceed.   LOS: 3 days    Adyson Vanburen A Donyetta Ogletree 06/29/2018 

## 2018-06-29 NOTE — Anesthesia Preprocedure Evaluation (Signed)
Anesthesia Evaluation  Patient identified by MRN, date of birth, ID band Patient awake    Reviewed: Allergy & Precautions, H&P , NPO status , Patient's Chart, lab work & pertinent test results  Airway Mallampati: II   Neck ROM: full    Dental   Pulmonary sleep apnea , former smoker,    breath sounds clear to auscultation       Cardiovascular hypertension, +CHF   Rhythm:regular Rate:Normal     Neuro/Psych PSYCHIATRIC DISORDERS Anxiety Depression    GI/Hepatic GERD  ,  Endo/Other  Hypothyroidism obese  Renal/GU Renal Insufficiency and ARFRenal disease     Musculoskeletal   Abdominal   Peds  Hematology  (+) anemia ,   Anesthesia Other Findings   Reproductive/Obstetrics                             Anesthesia Physical  Anesthesia Plan  ASA: III  Anesthesia Plan: General   Post-op Pain Management:    Induction: Intravenous  PONV Risk Score and Plan: 3 and Ondansetron, Dexamethasone and Treatment may vary due to age or medical condition  Airway Management Planned: LMA  Additional Equipment:   Intra-op Plan:   Post-operative Plan:   Informed Consent: I have reviewed the patients History and Physical, chart, labs and discussed the procedure including the risks, benefits and alternatives for the proposed anesthesia with the patient or authorized representative who has indicated his/her understanding and acceptance.     Plan Discussed with: CRNA, Anesthesiologist and Surgeon  Anesthesia Plan Comments:         Anesthesia Quick Evaluation

## 2018-06-29 NOTE — Op Note (Signed)
                     Excisional debridement:sacrum and perineum    Prep of diagnosis: Large complex wound of sacrum and perineum secondary to necrotizing fasciitis measuring 10 cm x 15 cm to include the anal sphincter  Postoperative diagnosis: Same  Procedure: Debridement of large sacral perineal and sacral wound  Surgeon: Erroll Luna, MD  Anesthesia: General  EBL: 20 cc  Specimen: None     1.  Progress note or procedure note with a detailed description of the procedure.  Indications for procedure: The patient is a 84-year-old female with a severe excising fasciitis involving her left buttock and perineum.  She returns for second debridement after initial debridement 2 days ago.  Risk, benefits and other options discussed.  The patient has baseline incontinence therefore will require colostomy later time.  She has suffered from sepsis this is improving with debridement.    Description of procedure: The patient was met in the holding area.  Questions answered.  Left side was marked as correct.  She taken back to the operative room placed supine initially.  After induction of general anesthesia she was rolled with the right side down and padded.  The wound was exposed.  It measured 10 x 15 cm and had significant necrotic tissue with foul smell.  After sterile prep and drape a timeout cautery and scalpel were used to come back to healthier muscle.  Some the muscle gluteus max was debrided.  The infection extended down through her perineal floor to encompass the area around the distal rectum.  The distal rectum was intact there is significant foul-smelling fluid.  This was copes irrigated out.  Her sphincter was also involved with the infection in her posterior internal and external sphincter at the anal canal were involved.  This was debrided.  There is no rectal injury noted.  There is no excessive bleeding.  Irrigation was used.  Hemostasis achieved.  Wet-to-dry dressing  were placed.  All final counts were found to be correct.  Patient extubated taken to recovery in stable condition.  She will require further debridement possibly and a loop colostomy next week.      2.  Tool used for debridement (curette, scapel, etc.)  Scalpel, cautery  3.  Frequency of surgical debridement.   2 nd debridement   4.  Measurement of total devitalized tissue (wound surface) before and after surgical debridement.   250 cm 2 and 350 cm 2  5.  Area and depth of devitalized tissue removed from wound.  100 cm 2  6.  Blood loss and description of tissue removed.  50 ccm dead necrotic muscle and fascia  7.  Evidence of the progress of the wound's response to treatment.  A.  Current wound volume (current dimensions and depth).  17 cm x 15 cm x 3 cm   B.  Presence (and extent of) of infection.  Present mild   C.  Presence (and extent of) of non viable tissue.  Minimal   D.  Other material in the wound that is expected to inhibit healing.  none  8.  Was there any viable tissue removed (measurements): 10 cm x 5 cm

## 2018-06-29 NOTE — Anesthesia Procedure Notes (Signed)
Procedure Name: LMA Insertion Date/Time: 06/29/2018 11:56 AM Performed by: Alvera NovelPike, Latesa Fratto H, CRNA Pre-anesthesia Checklist: Patient identified, Emergency Drugs available, Suction available and Patient being monitored Patient Re-evaluated:Patient Re-evaluated prior to induction Oxygen Delivery Method: Circle System Utilized Preoxygenation: Pre-oxygenation with 100% oxygen Induction Type: IV induction Ventilation: Mask ventilation without difficulty LMA: LMA inserted LMA Size: 4.0 Number of attempts: 1 Placement Confirmation: positive ETCO2 Tube secured with: Tape Dental Injury: Teeth and Oropharynx as per pre-operative assessment

## 2018-06-30 ENCOUNTER — Encounter (HOSPITAL_COMMUNITY): Payer: Self-pay | Admitting: Surgery

## 2018-06-30 LAB — BASIC METABOLIC PANEL
Anion gap: 12 (ref 5–15)
BUN: 73 mg/dL — ABNORMAL HIGH (ref 8–23)
CALCIUM: 8.3 mg/dL — AB (ref 8.9–10.3)
CO2: 15 mmol/L — AB (ref 22–32)
CREATININE: 2.07 mg/dL — AB (ref 0.44–1.00)
Chloride: 117 mmol/L — ABNORMAL HIGH (ref 98–111)
GFR calc Af Amer: 23 mL/min — ABNORMAL LOW (ref >60)
GFR calc non Af Amer: 20 mL/min — ABNORMAL LOW (ref >60)
GLUCOSE: 150 mg/dL — AB (ref 70–99)
Potassium: 3.9 mmol/L (ref 3.5–5.1)
Sodium: 144 mmol/L (ref 135–145)

## 2018-06-30 LAB — CBC
HEMATOCRIT: 24.3 % — AB (ref 36.0–46.0)
Hemoglobin: 8 g/dL — ABNORMAL LOW (ref 12.0–15.0)
MCH: 30.5 pg (ref 26.0–34.0)
MCHC: 32.9 g/dL (ref 30.0–36.0)
MCV: 92.7 fL (ref 78.0–100.0)
PLATELETS: 102 10*3/uL — AB (ref 150–400)
RBC: 2.62 MIL/uL — ABNORMAL LOW (ref 3.87–5.11)
RDW: 15.6 % — ABNORMAL HIGH (ref 11.5–15.5)
WBC: 15.4 10*3/uL — AB (ref 4.0–10.5)

## 2018-06-30 LAB — PROCALCITONIN: Procalcitonin: 4.33 ng/mL

## 2018-06-30 LAB — VANCOMYCIN, TROUGH: VANCOMYCIN TR: 16 ug/mL (ref 15–20)

## 2018-06-30 MED ORDER — COLLAGENASE 250 UNIT/GM EX OINT
TOPICAL_OINTMENT | Freq: Two times a day (BID) | CUTANEOUS | Status: DC
  Administered 2018-06-30: 23:00:00 via TOPICAL
  Administered 2018-07-01 – 2018-07-02 (×3): 1 via TOPICAL
  Administered 2018-07-03 – 2018-07-11 (×16): via TOPICAL
  Filled 2018-06-30 (×7): qty 30

## 2018-06-30 MED ORDER — PIPERACILLIN-TAZOBACTAM 3.375 G IVPB
3.3750 g | Freq: Three times a day (TID) | INTRAVENOUS | Status: DC
  Administered 2018-06-30 – 2018-07-04 (×12): 3.375 g via INTRAVENOUS
  Filled 2018-06-30 (×13): qty 50

## 2018-06-30 NOTE — Progress Notes (Signed)
PROGRESS NOTE    [name]  ZOX:[id number] DOB: Jan 10, 1928 DOA: 06/26/2018 PCP: Kirby Funk, MD      Brief Narrative:  [name] is a 84 y.o. F with HTN, hypothyroidism who presents with 1 week buttock pain, followed by malaise, diarrhea, and weakness.    Went to UC, where she was told she was "dehydrated".  Came to the ER, where she was found to have BP 80/40, leukocytosis, Cr 5.5, and lactic acidosis.  Given 30 cc/kg fluids, lactate cleared, BP improved somewhat, she was mentating well with MAP>60.  Started on ceftriaxone for presumed UTI.  CCM were consulted, recommended continued medical therapy with fluids, antibiotics.  CT abdomen showed ?enteritis, also buttock cellulitis.  Buttock examination showed cellulitis with necrosis.     Assessment & Plan:  Sepsis from Fournier's gangrene - S/P excisional debridement of L gluteal necrotizing soft tissue infection, Dr. Cliffton Asters, 07/31 - S/P Debridement of large sacral perineal and sacral wound, Dr. Luisa Hart, 08/02 Diverting colostomy and SP catheter have been recommended by General Surgery.  BP remains good.  Wound culture polymicrobial. -Continue vancomycin, Zosyn, clindamycin -Consult to general surgery, appreciate expert cares -We will consult urology for SP catheter   Acute renal failure on chronic kidney disease stage III Baseline creatinine 1.4-1.5.  Admitted with creatinine 5.5.  Continues to improve, urine output good. -Avoid nephrotoxins -Hold home Lasix -Continue IV fluids -Repeat creatinine tomorrow  Chronic normocytic anemia Doubted acute blood loss anemia There is talk of a diverting colostomy, per patient surgery notes also suggest SP tube or Foley chronically.    Low suspicion for GI bleed.  Hemoglobin at baseline 10-11.   FOBT negative.  Hgb stable overnight. -Daily CBC -Continue H2RA for stress ulcer PPx, while patient is in ICU, not for clinical supicion of GI bleed -Transfusion threshold 7 g/dL -Consult  gastroenterology, appreciate recommendations  Enteritis This is a nonspecific finding on CT.  Monitor stools.  Hypothyroidism -Continue levothyroxine  Hypertension Hypotensive at admission -Hold home Lasix, ARB   Other medications -Continue allopurinol, Zoloft     DVT prophylaxis: SCDs Code Status: DO NOT RESUSCITATE Family Communication: Daughter at the bedside MDM and disposition Plan: The below labs and imaging reports were reviewed and summarized above.  Medication management as above.  The patient was admitted with sepsis from Fournier's gangrene.  General surgical consult department this twice.  She was briefly on pressors for septic shock after the first debridement, but has had stable blood pressure on IV fluids since then.   She has resolving renal failure, also a new anemia which may require transfusion.  Gastroenterology were consulted, but we will have a low suspicion for GI bleed.  Will require further hydrotherapy, diverting colostomy and SP catheter.  Once surgical treatment is finished, we will evaluate with PT, likely will need placement.      Consultants:   GI  Nephrology  General Surgery  Procedures:   None  Antimicrobials:   Ceftriaxone 7/31 >> 7/31  Vancomycin 7/31 >>  Flagyl 7/31 >> 7/31  Clindamycin 7/31 >>  Zosyn 7/31 >>   Cultures:   7/30 Blood cultures x2: No growth  7/30 Urine culture: Enterobacter  7/31 intraoperative culture: Polymicrobial      Subjective: Mentation good, still tired but no buttock pain.  No new fever, vomiting, cough, sputum, abdominal pain, dysuria, flank pain.  I discussed her case with nursing.  Objective: Vitals:   06/29/18 1453 06/29/18 2048 06/29/18 2355 06/30/18 0343  BP: (!) 104/59 Marland Kitchen)  123/56 122/63 112/82  Pulse: 73 (!) 38 (!) 39 (!) 41  Resp: (!) 23 (!) 23 (!) 22 (!) 27  Temp: 97.6 F (36.4 C) 98 F (36.7 C) 98.7 F (37.1 C) 98 F (36.7 C)  TempSrc: Oral Oral Oral Axillary    SpO2: 95% 97% 96% 96%  Weight:    107 kg (235 lb 14.4 oz)  Height:        Intake/Output Summary (Last 24 hours) at 06/30/2018 1411 Last data filed at 06/30/2018 1610 Gross per 24 hour  Intake 489.96 ml  Output 726 ml  Net -236.04 ml   Filed Weights   06/28/18 0500 06/29/18 0500 06/30/18 0343  Weight: 104 kg (229 lb 4.5 oz) 106.9 kg (235 lb 10.8 oz) 107 kg (235 lb 14.4 oz)    Examination: General appearance: Obese adult female, lying in bed, interactive, more alert today, tired overall. HEENT: Anicteric, conjunctive are pink, lids and lashes normal.  No nasal deformity, discharge, or epistaxis.  Lips moist, oropharynx moist, no oral lesions.  Hearing normal. Skin: Skin warm and dry, dressing and buttock wound as pictured in physical therapy notes from today Cardiac: Regular rate and rhythm, no murmurs, no lower extremity edema Respiratory: Respiratory rate normal, lungs clear without rales or wheezes Abdomen: Abdomen soft without focal tenderness or voluntary guarding. MSK: No deformities or effusions. Neuro: Sleepy but awake, extraocular movements intact, upper extremity movement symmetric and globally weak.  Speech fluent Psych: Sensorium intact and responding to questions, attention diminished, affect blunted, judgment and insight appear normal   Data Reviewed: I have personally reviewed following labs and imaging studies:  CBC: Recent Labs  Lab 06/27/18 1624 06/27/18 2312 06/27/18 2332 06/28/18 1518 06/29/18 0432 06/30/18 0422  WBC 8.0  --  8.2 8.7 11.8* 15.4*  HGB 9.5* 8.1* 8.8* 8.0* 7.3* 8.0*  HCT 28.7* 25.2* 29.0* 25.0* 22.4* 24.3*  MCV 93.2  --  100.7* 94.0 92.6 92.7  PLT 124*  --  107* 104* 87* 102*   Basic Metabolic Panel: Recent Labs  Lab 06/26/18 2106 06/27/18 0003 06/27/18 0416 06/27/18 2332 06/29/18 0432 06/30/18 0422  NA 136  --  141 143 145 144  K 5.1  --  4.6 4.3 3.7 3.9  CL 99  --  107 112* 114* 117*  CO2 18*  --  21* 16* 14* 15*  GLUCOSE 112*   --  85 103* 131* 150*  BUN 100*  --  96* 94* 81* 73*  CREATININE 5.59*  --  5.04* 3.82* 2.62* 2.07*  CALCIUM 8.5*  --  7.8* 7.5* 8.0* 8.3*  MG  --  2.1  --  2.0 2.1  --   PHOS  --   --   --   --  5.1*  --    GFR: Estimated Creatinine Clearance: 22.4 mL/min (A) (by C-G formula based on SCr of 2.07 mg/dL (H)). Liver Function Tests: Recent Labs  Lab 06/26/18 2106 06/27/18 0416  AST 22 19  ALT 13 12  ALKPHOS 131* 101  BILITOT 1.6* 1.3*  PROT 6.1* 5.3*  ALBUMIN 2.7* 2.2*   No results for input(s): LIPASE, AMYLASE in the last 168 hours. No results for input(s): AMMONIA in the last 168 hours. Coagulation Profile: Recent Labs  Lab 06/26/18 2106  INR 1.24   Cardiac Enzymes: Recent Labs  Lab 06/27/18 0003 06/27/18 0416 06/27/18 1039  TROPONINI <0.03 <0.03 0.03*   BNP (last 3 results) No results for input(s): PROBNP in the last 8760 hours.  HbA1C: No results for input(s): HGBA1C in the last 72 hours. CBG: Recent Labs  Lab 06/27/18 2228 06/29/18 0936  GLUCAP 98 119*   Lipid Profile: No results for input(s): CHOL, HDL, LDLCALC, TRIG, CHOLHDL, LDLDIRECT in the last 72 hours. Thyroid Function Tests: No results for input(s): TSH, T4TOTAL, FREET4, T3FREE, THYROIDAB in the last 72 hours. Anemia Panel: No results for input(s): VITAMINB12, FOLATE, FERRITIN, TIBC, IRON, RETICCTPCT in the last 72 hours. Urine analysis:    Component Value Date/Time   COLORURINE YELLOW 06/27/2018 0043   APPEARANCEUR HAZY (A) 06/27/2018 0043   LABSPEC 1.012 06/27/2018 0043   PHURINE 5.0 06/27/2018 0043   GLUCOSEU NEGATIVE 06/27/2018 0043   HGBUR NEGATIVE 06/27/2018 0043   BILIRUBINUR NEGATIVE 06/27/2018 0043   KETONESUR NEGATIVE 06/27/2018 0043   PROTEINUR NEGATIVE 06/27/2018 0043   NITRITE NEGATIVE 06/27/2018 0043   LEUKOCYTESUR SMALL (A) 06/27/2018 0043   Sepsis Labs: @LABRCNTIP (procalcitonin:4,lacticacidven:4)  ) Recent Results (from the past 240 hour(s))  Urine Culture     Status:  Abnormal   Collection Time: 06/27/18 12:43 AM  Result Value Ref Range Status   Specimen Description URINE, RANDOM  Final   Special Requests   Final    NONE Performed at [facility], [address] [place], [place] [address]    Culture >=100,000 COLONIES/mL ENTEROBACTER CLOACAE (A)  Final   Report Status 06/29/2018 FINAL  Final   Organism ID, Bacteria ENTEROBACTER CLOACAE (A)  Final      Susceptibility   Enterobacter cloacae - MIC*    CEFAZOLIN >=64 RESISTANT Resistant     CEFTRIAXONE 2 SENSITIVE Sensitive     CIPROFLOXACIN <=0.25 SENSITIVE Sensitive     GENTAMICIN <=1 SENSITIVE Sensitive     IMIPENEM <=0.25 SENSITIVE Sensitive     NITROFURANTOIN 64 INTERMEDIATE Intermediate     TRIMETH/SULFA <=20 SENSITIVE Sensitive     PIP/TAZO 8 SENSITIVE Sensitive     * >=100,000 COLONIES/mL ENTEROBACTER CLOACAE  MRSA PCR Screening     Status: None   Collection Time: 06/27/18  3:16 AM  Result Value Ref Range Status   MRSA by PCR NEGATIVE NEGATIVE Final    Comment:        The GeneXpert MRSA Assay (FDA approved for NASAL specimens only), is one component of a comprehensive MRSA colonization surveillance program. It is not intended to diagnose MRSA infection nor to guide or monitor treatment for MRSA infections. Performed at [facility], [address], [place], [place] [address]   Culture, blood (Routine X 2) w Reflex to ID Panel     Status: None (Preliminary result)   Collection Time: 06/27/18  4:05 AM  Result Value Ref Range Status   Specimen Description BLOOD LEFT ANTECUBITAL  Final   Special Requests IN PEDIATRIC BOTTLE Blood Culture adequate volume  Final   Culture   Final    NO GROWTH 3 DAYS Performed at [facility], [address] [place], [place] [address]    Report Status PENDING  Incomplete  Culture, blood (Routine X 2) w Reflex to ID Panel     Status: None (Preliminary result)   Collection Time: 06/27/18  4:20 AM  Result Value Ref Range Status    Specimen Description BLOOD LEFT HAND  Final   Special Requests IN PEDIATRIC BOTTLE Blood Culture adequate volume  Final   Culture   Final    NO GROWTH 3 DAYS Performed at [facility], [address], [place], [place] [address]  Report Status PENDING  Incomplete  Fungus Culture With Stain     Status: None (Preliminary result)   Collection Time: 06/27/18  6:01 PM  Result Value Ref Range Status   Fungus Stain Final report  Final    Comment: (NOTE) Performed At: [facility] [address] [place], [place] [id number] Jolene SchimkeNagendra Sanjai MD JX:[id number]:[phone]    Fungus (Mycology) Culture PENDING  Incomplete   Fungal Source ABSCESS  Final    Comment: LEFT BUTTOCKS ON VANC Performed at [facility], [address] [place], [place] [address]   Aerobic/Anaerobic Culture (surgical/deep wound)     Status: None (Preliminary result)   Collection Time: 06/27/18  6:01 PM  Result Value Ref Range Status   Specimen Description ABSCESS  Final   Special Requests NONE  Final   Gram Stain   Final    NO WBC SEEN MODERATE GRAM POSITIVE COCCI IN PAIRS MODERATE GRAM NEGATIVE COCCOBACILLI    Culture   Final    MODERATE BACTEROIDES SPECIES BETA LACTAMASE POSITIVE RARE KLEBSIELLA PNEUMONIAE SUSCEPTIBILITIES TO FOLLOW Performed at [facility], [address] [place], [place] [address]    Report Status PENDING  Incomplete  Fungus Culture Result     Status: None   Collection Time: 06/27/18  6:01 PM  Result Value Ref Range Status   Result 1 Comment  Final    Comment: (NOTE) KOH/Calcofluor preparation:  no fungus observed. Performed At: [facility] [address] [place], [place] [id number] Jolene SchimkeNagendra Sanjai MD LK:[id number]:[phone]   Surgical pcr screen     Status: None   Collection Time: 06/29/18  8:56 AM  Result Value Ref Range Status   MRSA, PCR NEGATIVE NEGATIVE Final   Staphylococcus aureus NEGATIVE NEGATIVE Final    Comment: (NOTE) The Xpert SA Assay (FDA approved for  NASAL specimens in patients 82 years of age and older), is one component of a comprehensive surveillance program. It is not intended to diagnose infection nor to guide or monitor treatment. Performed at [facility], [address] [place], [place] [address]          Radiology Studies: Dg Chest Port 1 View  Result Date: 06/29/2018 CLINICAL DATA:  Respiratory failure. EXAM: PORTABLE CHEST 1 VIEW COMPARISON:  One-view chest x-ray 06/27/2018. FINDINGS: The heart is enlarged. Aortic atherosclerosis is present. There is no edema or effusion. Pulmonary vascular congestion is stable. No significant airspace consolidation is present. IMPRESSION: 1. Stable cardiomegaly and mild pulmonary vascular congestion. 2. Aortic atherosclerosis. Electronically Signed   By: Marin Robertshristopher  Mattern M.D.   On: 06/29/2018 07:38        Scheduled Meds: . allopurinol  100 mg Oral Daily  . collagenase   Topical BID  . levothyroxine  137 mcg Oral QAC breakfast  . sertraline  50 mg Oral Daily   Continuous Infusions: . sodium chloride 100 mL/hr at 06/30/18 1228  . clindamycin (CLEOCIN) IV 600 mg (06/30/18 0624)  . famotidine (PEPCID) IV Stopped (06/28/18 1541)  . piperacillin-tazobactam (ZOSYN)  IV 3.375 g (06/30/18 1301)  . vancomycin 250 mL/hr at 06/29/18 0900     LOS: 4 days    Time spent: 25 minutse    Alberteen Samhristopher P Danford, MD [facility] 06/30/2018, 2:11 PM     Pager [phone] --- please page though AMION:  [email or web address] Password [email or web address] If 7PM-7AM, please contact night-coverage

## 2018-06-30 NOTE — Progress Notes (Signed)
Pt has her home CPAP in the room.  CPAP is all setup and ready.  Pt does not need assistance.

## 2018-06-30 NOTE — Progress Notes (Addendum)
Pharmacy Antibiotic Note  [name] is a 84 y.o. female admitted on 06/26/2018 with necrotizing fascitis .  Pt may need to return to OR for wound control. Pt no longer on pressors/in shock  Vanc Random this today 16 - renal fx slightly better  Plan: Zosyn 3.375g IV q8h Vanc 1500 mg q48h Clindamycin per MD - dc? Monitor renal fx cx vanc lvls prn  Height: 5\' 4"  (162.6 cm) Weight: 235 lb 14.4 oz (107 kg) IBW/kg (Calculated) : 54.7  Temp (24hrs), Avg:98.2 F (36.8 C), Min:98 F (36.7 C), Max:98.7 F (37.1 C)  Recent Labs  Lab 06/26/18 2106 06/26/18 2140 06/26/18 2332 06/27/18 0416 06/27/18 1624 06/27/18 2312 06/27/18 2332 06/28/18 0436 06/28/18 1518 06/29/18 0432 06/29/18 0942 06/30/18 0422 06/30/18 1337  WBC 16.1*  --   --  13.0* 8.0  --  8.2  --  8.7 11.8*  --  15.4*  --   CREATININE 5.59*  --   --  5.04*  --   --  3.82*  --   --  2.62*  --  2.07*  --   LATICACIDVEN  --  2.96* 1.87  --   --  3.0*  --  2.5*  --   --  1.0  --   --   VANCOTROUGH  --   --   --   --   --   --   --   --   --   --   --   --  16  VANCORANDOM  --   --   --   --   --   --   --   --   --  11  --   --   --     Estimated Creatinine Clearance: 22.4 mL/min (A) (by C-G formula based on SCr of 2.07 mg/dL (H)).    Allergies  Allergen Reactions  . Morphine And Related Hives and Itching  . Gabapentin Other (See Comments)    Alters mood  . Lipitor [Atorvastatin] Itching and Rash  . Lyrica [Pregabalin] Other (See Comments)    Lethargic, mood changes  . Sulfa Antibiotics Rash   Ewing Schleinolton Dimetri Armitage, PharmD PGY1 Pharmacy Resident 06/30/2018    6:54 PM

## 2018-06-30 NOTE — Progress Notes (Addendum)
[facility]/Trauma Progress Note  1 Day Post-Op   Assessment/Plan AKI on CKD stage III - Cr 3.82, improving Acute on Chronic normocytic anemia - H/H 8.8/29, per primary service Enteritis - nonspecific finding on CT Hypothyroidism - home meds HTN - holding home meds for hypoTN Obesity - BMI 38.52 OSA  Sepsis- likely 2/2 below, improving  L buttock Fournier's Gangrene - S/P excisional debridement of L gluteal necrotizing soft tissue infection, Dr. Cliffton Asters, 07/31 - S/P Debridement of large sacral perineal and sacral wound, Dr. Luisa Hart, 08/02 - WBC 15.4, afebrile, lactic WNL  - cxs pending, stain shows G+ cocci and G- coccobacilli - continue broad spectrum IV abx - patient is incontinent - possible diverting colostomy next week - hydrotherapy  - BID dressing changes with prn changes when soiled.   FEN:CLD, IVF VTE: SCDs ID: vanc  Zosyn  cleocin 7/31>> Foley: present Follow up: TBD  DISPO: hydrotherapy. Possible diverting colostomy next week. BID dressing changes. Pain control. Recommend urology consult for suprapubic cath.    LOS: 4 days    Subjective: CC: necrotizing infection of buttock  No complaints of pain. Daughter at bedside and we discussed possibility of diverting colostomy next week. Discussed urine incontinence and need for urology consult for possible suprapubic cath. No new complaints.   Objective: Vital signs in last 24 hours: Temp:  [97.6 F (36.4 C)-98.7 F (37.1 C)] 98 F (36.7 C) (08/03 0343) Pulse Rate:  [31-73] 41 (08/03 0343) Resp:  [16-27] 27 (08/03 0343) BP: (101-123)/(47-82) 112/82 (08/03 0343) SpO2:  [95 %-100 %] 96 % (08/03 0343) Weight:  [107 kg (235 lb 14.4 oz)] 107 kg (235 lb 14.4 oz) (08/03 0343) Last BM Date: 06/26/18  Intake/Output from previous day: 08/02 0701 - 08/03 0700 In: 695.5 [I.V.:475.6; IV Piggyback:219.9] Out: 961 [Urine:910; Stool:1; Blood:50] Intake/Output this shift: No intake/output data  recorded.  PE: Gen:  Alert, NAD, pleasant, cooperative Pulm:  Rate and effort normal GU: wound see below Skin: no rashes noted, warm and dry      Anti-infectives: Anti-infectives (From admission, onward)   Start     Dose/Rate Route Frequency Ordered Stop   06/29/18 0900  vancomycin (VANCOCIN) 1,500 mg in sodium chloride 0.9 % 500 mL IVPB     1,500 mg 250 mL/hr over 120 Minutes Intravenous Every 48 hours 06/29/18 0805     06/28/18 1055  vancomycin variable dose per unstable renal function (pharmacist dosing)  Status:  Discontinued      Does not apply See admin instructions 06/28/18 1055 06/29/18 0805   06/27/18 2345  piperacillin-tazobactam (ZOSYN) IVPB 2.25 g     2.25 g 100 mL/hr over 30 Minutes Intravenous Every 8 hours 06/27/18 2336     06/27/18 2315  vancomycin (VANCOCIN) IVPB 750 mg/150 ml premix  Status:  Discontinued    Note to Pharmacy:  Pharmacy to dose and monitor levels as appropriate for necrotizing fascitis   750 mg 150 mL/hr over 60 Minutes Intravenous Every 12 hours 06/27/18 2303 06/27/18 2329   06/27/18 2315  clindamycin (CLEOCIN) IVPB 600 mg     600 mg 100 mL/hr over 30 Minutes Intravenous Every 8 hours 06/27/18 2309     06/27/18 1215  vancomycin (VANCOCIN) 1,500 mg in sodium chloride 0.9 % 500 mL IVPB     1,500 mg 250 mL/hr over 120 Minutes Intravenous  Once 06/27/18 1142 06/27/18 1500   06/27/18 1100  metroNIDAZOLE (FLAGYL) IVPB 500 mg  Status:  Discontinued     500 mg  100 mL/hr over 60 Minutes Intravenous Every 8 hours 06/27/18 1052 06/27/18 2303   06/27/18 0200  cefTRIAXone (ROCEPHIN) 1 g in sodium chloride 0.9 % 100 mL IVPB  Status:  Discontinued     1 g 200 mL/hr over 30 Minutes Intravenous Every 24 hours 06/27/18 0138 06/27/18 2258      Lab Results:  Recent Labs    06/29/18 0432 06/30/18 0422  WBC 11.8* 15.4*  HGB 7.3* 8.0*  HCT 22.4* 24.3*  PLT 87* 102*   BMET Recent Labs    06/29/18 0432 06/30/18 0422  NA 145 144  K 3.7 3.9  CL 114*  117*  CO2 14* 15*  GLUCOSE 131* 150*  BUN 81* 73*  CREATININE 2.62* 2.07*  CALCIUM 8.0* 8.3*   PT/INR No results for input(s): LABPROT, INR in the last 72 hours. CMP     Component Value Date/Time   NA 144 06/30/2018 0422   K 3.9 06/30/2018 0422   CL 117 (H) 06/30/2018 0422   CO2 15 (L) 06/30/2018 0422   GLUCOSE 150 (H) 06/30/2018 0422   BUN 73 (H) 06/30/2018 0422   CREATININE 2.07 (H) 06/30/2018 0422   CALCIUM 8.3 (L) 06/30/2018 0422   PROT 5.3 (L) 06/27/2018 0416   ALBUMIN 2.2 (L) 06/27/2018 0416   AST 19 06/27/2018 0416   ALT 12 06/27/2018 0416   ALKPHOS 101 06/27/2018 0416   BILITOT 1.3 (H) 06/27/2018 0416   GFRNONAA 20 (L) 06/30/2018 0422   GFRAA 23 (L) 06/30/2018 0422   Lipase  No results found for: LIPASE  Studies/Results: Dg Chest Port 1 View  Result Date: 06/29/2018 CLINICAL DATA:  Respiratory failure. EXAM: PORTABLE CHEST 1 VIEW COMPARISON:  One-view chest x-ray 06/27/2018. FINDINGS: The heart is enlarged. Aortic atherosclerosis is present. There is no edema or effusion. Pulmonary vascular congestion is stable. No significant airspace consolidation is present. IMPRESSION: 1. Stable cardiomegaly and mild pulmonary vascular congestion. 2. Aortic atherosclerosis. Electronically Signed   By: Marin Robertshristopher  Mattern M.D.   On: 06/29/2018 07:38      Jerre SimonJessica L Jashay Roddy , [facility] 06/30/2018, 9:10 AM  Pager: [phone] Mon-Wed, Friday 7:00am-4:30pm Thurs 7am-11:30am  Consults: [phone]

## 2018-06-30 NOTE — Progress Notes (Signed)
Subjective:  Went back to OR yesterday for further debridement for Fournier's gangrene and I didn't see her.    This morning she is doing OK.  Plan for diverting ostomy Monday and possible suprapubic catheter (urology to comment).    UOP remains normal and renal function continues to improve.   Objective Vital signs in last 24 hours: Vitals:   06/29/18 1453 06/29/18 2048 06/29/18 2355 06/30/18 0343  BP: (!) 104/59 (!) 123/56 122/63 112/82  Pulse: 73 (!) 38 (!) 39 (!) 41  Resp: (!) 23 (!) 23 (!) 22 (!) 27  Temp: 97.6 F (36.4 C) 98 F (36.7 C) 98.7 F (37.1 C) 98 F (36.7 C)  TempSrc: Oral Oral Oral Axillary  SpO2: 95% 97% 96% 96%  Weight:    107 kg (235 lb 14.4 oz)  Height:       Weight change: 0.103 kg (3.7 oz)  Intake/Output Summary (Last 24 hours) at 06/30/2018 1005 Last data filed at 06/30/2018 0624 Gross per 24 hour  Intake 489.96 ml  Output 776 ml  Net -286.04 ml    Assessment/ Plan:   1.  Severe AKI: hypovolemia + ARB prior to admission + sepsis secondary to necrotizing wound.  On admission Cr mid 5s, Urine Na <5 and she's been improving daily with creatinine down to 2.07 today with normal UOP.  I would recommend checking daily labs and avoiding any potentially nephrotoxic events as much as possible (contrast, hypotension, NSAIDs). Given the continued improvement I will sign off but please feel free to call us back if anything changes.    2. Anemia:  Not related to AKI, defer mgmt to CCM.   3.  Hypertension:  Baseline hypertension, on ARB at home.  Normotensive here on no medications.  Would avoid RAAS blockade during hospitalization if she ends up needing and antiHTN medication while here.    Tyler PitaKruska, Elzy Tomasello A    Labs: Basic Metabolic Panel: Recent Labs  Lab 06/27/18 2332 06/29/18 0432 06/30/18 0422  NA 143 145 144  K 4.3 3.7 3.9  CL 112* 114* 117*  CO2 16* 14* 15*  GLUCOSE 103* 131* 150*  BUN 94* 81* 73*  CREATININE 3.82* 2.62* 2.07*  CALCIUM 7.5* 8.0*  8.3*  PHOS  --  5.1*  --    Liver Function Tests: Recent Labs  Lab 06/26/18 2106 06/27/18 0416  AST 22 19  ALT 13 12  ALKPHOS 131* 101  BILITOT 1.6* 1.3*  PROT 6.1* 5.3*  ALBUMIN 2.7* 2.2*   No results for input(s): LIPASE, AMYLASE in the last 168 hours. No results for input(s): AMMONIA in the last 168 hours. CBC: Recent Labs  Lab 06/27/18 1624  06/27/18 2332 06/28/18 1518 06/29/18 0432 06/30/18 0422  WBC 8.0  --  8.2 8.7 11.8* 15.4*  HGB 9.5*   < > 8.8* 8.0* 7.3* 8.0*  HCT 28.7*   < > 29.0* 25.0* 22.4* 24.3*  MCV 93.2  --  100.7* 94.0 92.6 92.7  PLT 124*  --  107* 104* 87* 102*   < > = values in this interval not displayed.   Cardiac Enzymes: Recent Labs  Lab 06/27/18 0003 06/27/18 0416 06/27/18 1039  TROPONINI <0.03 <0.03 0.03*   CBG: Recent Labs  Lab 06/27/18 2228 06/29/18 0936  GLUCAP 98 119*    Iron Studies: No results for input(s): IRON, TIBC, TRANSFERRIN, FERRITIN in the last 72 hours. Studies/Results: Dg Chest Port 1 View  Result Date: 06/29/2018 CLINICAL DATA:  Respiratory failure. EXAM: PORTABLE  CHEST 1 VIEW COMPARISON:  One-view chest x-ray 06/27/2018. FINDINGS: The heart is enlarged. Aortic atherosclerosis is present. There is no edema or effusion. Pulmonary vascular congestion is stable. No significant airspace consolidation is present. IMPRESSION: 1. Stable cardiomegaly and mild pulmonary vascular congestion. 2. Aortic atherosclerosis. Electronically Signed   By: Marin Roberts M.D.   On: 06/29/2018 07:38   Medications: Infusions: . sodium chloride 100 mL/hr at 06/29/18 1501  . clindamycin (CLEOCIN) IV 600 mg (06/30/18 0624)  . famotidine (PEPCID) IV Stopped (06/28/18 1541)  . piperacillin-tazobactam (ZOSYN)  IV 2.25 g (06/29/18 2218)  . vancomycin 250 mL/hr at 06/29/18 0900    Scheduled Medications: . allopurinol  100 mg Oral Daily  . levothyroxine  137 mcg Oral QAC breakfast  . sertraline  50 mg Oral Daily    have reviewed  scheduled and prn medications.  Physical Exam: General: awake in bed, brushing dentures Heart: RRR, no rub Lungs: normal WOB, clear Abdomen: soft nontender Extremities: no edema     06/30/2018,10:05 AM  LOS: 4 days

## 2018-06-30 NOTE — Progress Notes (Signed)
Physical Therapy Wound Treatment Patient Details  Name: [name] MRN: [id number] Date of Birth: 1928/10/02  Today's Date: 06/30/2018 Time: 1005-1104 Time Calculation (min): 59 min  Subjective  Subjective: "I'm doing ok today." Patient and Family Stated Goals: Heal wound per pt and family member present Date of Onset: (Unsure) Prior Treatments: Surgical debridement 06/27/18 and 06/29/18  Pain Score:  Pt was premedicated prior to start of session. Reported minimal pain during debridement ~5:00 area of wound.  Wound Assessment  Pressure Injury 06/27/18 Unstageable - Full thickness tissue loss in which the base of the ulcer is covered by slough (yellow, tan, gray, green or brown) and/or eschar (tan, brown or black) in the wound bed. Three areas of black eschar surrounded by blan (Active)  Wound Image  Appears cleaner since second debridement.  06/28/2018 12:00 PM  Dressing Type ABD;Moist to dry;Barrier Film (skin prep);Gauze (Comment) 06/30/2018 11:49 AM  Dressing Clean;Dry;Intact;Changed 06/30/2018 11:49 AM  Dressing Change Frequency Daily 06/30/2018 11:49 AM  State of Healing Non-healing 06/30/2018 11:49 AM  Site / Wound Assessment Red;Pink;Yellow;Black 06/30/2018 11:49 AM  % Wound base Red or Granulating 45% 06/30/2018 11:49 AM  % Wound base Yellow/Fibrinous Exudate 40% 06/30/2018 11:49 AM  % Wound base Black/Eschar 15% 06/30/2018 11:49 AM  Peri-wound Assessment Intact;Erythema (blanchable) 06/30/2018 11:49 AM  Wound Length (cm) 15 cm 06/28/2018  1:00 PM  Wound Width (cm) 10.3 cm 06/28/2018  1:00 PM  Wound Depth (cm) 3.8 cm 06/28/2018  1:00 PM  Wound Surface Area (cm^2) 154.5 cm^2 06/28/2018  1:00 PM  Wound Volume (cm^3) 587.1 cm^3 06/28/2018  1:00 PM  Undermining (cm) 2.0cm from 6-7 o'clock; 2.2cm 7-9 o'clock; 3.9cm from 9-10 o'clock; 3 cm from 10-12 o'clock  06/28/2018  1:00 PM  Margins Unattached edges (unapproximated) 06/30/2018 11:49 AM  Drainage Amount Copious 06/30/2018 11:49 AM  Drainage Description  Serosanguineous;Odor 06/30/2018 11:49 AM  Treatment Debridement (Selective);Hydrotherapy (Pulse lavage);Off loading;Packing (Saline gauze) 06/30/2018 11:49 AM      Hydrotherapy Pulsed lavage therapy - wound location: Buttock Pulsed Lavage with Suction (psi): 12 psi Pulsed Lavage with Suction - Normal Saline Used: 1000 mL Pulsed Lavage Tip: Tip with splash shield Selective Debridement Selective Debridement - Location: Buttock Selective Debridement - Tools Used: Forceps;Scissors Selective Debridement - Tissue Removed: Yellow and block necrotic tissue   Wound Assessment and Plan  Wound Therapy - Assess/Plan/Recommendations Wound Therapy - Clinical Statement: Pt presents to hydrotherapy s/p second surgical debridement on 06/29/18. When PT arrived bed pad saturated with copious amounts of foul smelling drainage. A significant amount of necrotic tissue was removed however a significant amount still remains as well. Discussed with RN use of Santyl for next session if surgical team agreeable. This patient will benefit from continued hydrotherapy for selective removal of unviable tissue, to decrease bioburden and promote wound bed healing.  Wound Therapy - Functional Problem List: Decreased tolerance for OOB mobility/position changes Factors Delaying/Impairing Wound Healing: Immobility;Multiple medical problems;Altered sensation Hydrotherapy Plan: Debridement;Dressing change;Patient/family education;Pulsatile lavage with suction Wound Therapy - Frequency: 6X / week Wound Therapy - Follow Up Recommendations: Skilled nursing facility Wound Plan: See above  Wound Therapy Goals- Improve the function of patient's integumentary system by progressing the wound(s) through the phases of wound healing (inflammation - proliferation - remodeling) by: Decrease Necrotic Tissue to: 20% Decrease Necrotic Tissue - Progress: Progressing toward goal Increase Granulation Tissue to: 80% Increase Granulation Tissue -  Progress: Progressing toward goal Goals/treatment plan/discharge plan were made with and agreed upon by patient/family: Yes Time  For Goal Achievement: 7 days Wound Therapy - Potential for Goals: Good  Goals will be updated until maximal potential achieved or discharge criteria met.  Discharge criteria: when goals achieved, discharge from hospital, MD decision/surgical intervention, no progress towards goals, refusal/missing three consecutive treatments without notification or medical reason.  GP     Thelma Comp 06/30/2018, 12:01 PM  Rolinda Roan, PT, DPT Acute Rehabilitation Services Pager: [phone]

## 2018-07-01 LAB — CBC
HCT: 26.1 % — ABNORMAL LOW (ref 36.0–46.0)
HEMOGLOBIN: 8.3 g/dL — AB (ref 12.0–15.0)
MCH: 29.9 pg (ref 26.0–34.0)
MCHC: 31.8 g/dL (ref 30.0–36.0)
MCV: 93.9 fL (ref 78.0–100.0)
Platelets: 101 10*3/uL — ABNORMAL LOW (ref 150–400)
RBC: 2.78 MIL/uL — ABNORMAL LOW (ref 3.87–5.11)
RDW: 15.7 % — AB (ref 11.5–15.5)
WBC: 20.4 10*3/uL — ABNORMAL HIGH (ref 4.0–10.5)

## 2018-07-01 LAB — BASIC METABOLIC PANEL
ANION GAP: 7 (ref 5–15)
BUN: 72 mg/dL — AB (ref 8–23)
CO2: 19 mmol/L — ABNORMAL LOW (ref 22–32)
Calcium: 8 mg/dL — ABNORMAL LOW (ref 8.9–10.3)
Chloride: 117 mmol/L — ABNORMAL HIGH (ref 98–111)
Creatinine, Ser: 1.91 mg/dL — ABNORMAL HIGH (ref 0.44–1.00)
GFR calc Af Amer: 26 mL/min — ABNORMAL LOW (ref >60)
GFR, EST NON AFRICAN AMERICAN: 22 mL/min — AB (ref >60)
GLUCOSE: 108 mg/dL — AB (ref 70–99)
POTASSIUM: 4.8 mmol/L (ref 3.5–5.1)
Sodium: 143 mmol/L (ref 135–145)

## 2018-07-01 LAB — AEROBIC/ANAEROBIC CULTURE W GRAM STAIN (SURGICAL/DEEP WOUND): Gram Stain: NONE SEEN

## 2018-07-01 LAB — AEROBIC/ANAEROBIC CULTURE (SURGICAL/DEEP WOUND)

## 2018-07-01 MED ORDER — PANTOPRAZOLE SODIUM 40 MG PO TBEC
40.0000 mg | DELAYED_RELEASE_TABLET | Freq: Every day | ORAL | Status: DC
  Administered 2018-07-01 – 2018-07-03 (×3): 40 mg via ORAL
  Filled 2018-07-01 (×3): qty 1

## 2018-07-01 NOTE — Progress Notes (Signed)
PROGRESS NOTE    [name]  ZOX:[id number] DOB: 03-12-1928 DOA: 06/26/2018 PCP: Rebecca Funk, MD      Brief Narrative:  [name] is a 84 y.o. F with HTN, hypothyroidism who presents with 1 week buttock pain, followed by malaise, diarrhea, and weakness.    Went to UC, where she was told she was "dehydrated".  Came to the ER, where she was found to have BP 80/40, leukocytosis, Cr 5.5, and lactic acidosis.  Given 30 cc/kg fluids, lactate cleared, BP improved somewhat, she was mentating well with MAP>60.  Started on ceftriaxone for presumed UTI.  CCM were consulted, recommended continued medical therapy with fluids, antibiotics.  CT abdomen showed ?enteritis, also buttock cellulitis.  Buttock examination showed cellulitis with necrosis.     Assessment & Plan:  Sepsis from Fournier's gangrene S/P excisional debridement of L gluteal necrotizing soft tissue infection, Dr. Cliffton Asters, 07/31 S/P Debridement of large sacral perineal and sacral wound, Dr. Luisa Hart, 08/02 Diverting colostomy and SP catheter have been recommended by General Surgery.  Wound culture polymicrobial.  BP stable.  WBC rising, no new fever. -Continue vancomycin, Zosyn, clindamycin, currently day 2 of 7 (starting from final debridement) -Consult to general surgery, appreciate expert cares -SP catheter per Urology and General Surgery   Acute renal failure on chronic kidney disease stage III Baseline creatinine 1.4-1.5.  Admitted with creatinine 5.5.  Creatinine improved again, urine output excellent. -Avoid nephrotoxins -Hold home Lasix -Continue IV fluids, reduce rate, push oral fluids -Trend creatinine -Strict I/Os  Chronic normocytic anemia Doubted acute blood loss anemia There is talk of a diverting colostomy, per patient surgery notes also suggest SP tube or Foley chronically.    Low suspicion for GI bleed.  Hemoglobin at baseline 10-11.   FOBT negative.  Hgb remains stable. -Daily CBC -Continue PPI for  stress ulcer PPx, while patient is in ICU, not for clinical supicion of GI bleed -Transfusion threshold 7 g/dL -Gastroenterology were consulted, they have signed off  Enteritis This is a nonspecific finding on CT.  Monitor stools.  Hypothyroidism -Continue levothyroxine  Hypertension Hypotensive at admission, BP remains normotensive off medicines. -Hold Lasix, ARB  Other medications -Continue allopurinol, Zoloft     DVT prophylaxis: SCDs Code Status: DO NOT RESUSCITATE Family Communication: Daughter at the bedside MDM and disposition Plan: The below labs and imaging reports were reviewed and summarized above.  Medication management as above.  The patient was admitted with sepsis from Fournier's gangrene.  General surgery were consulted and debrided this twice.  She was briefly on pressors for septic shock after the first debridement, but has had stable blood pressure on IV fluids since then.   She has resolving renal failure, also a new anemia which may require transfusion.  Nephrology and gastroenterology were consulted, but we will have a low suspicion for GI bleed, and her renal function is improving, and so both have signed off.  Per surgery, will require further hydrotherapy, diverting colostomy and SP catheter.  Once surgical treatment is finished, we will evaluate with PT, likely will need placement.      Consultants:   GI  Nephrology  General Surgery  Procedures:   None  Antimicrobials:   Ceftriaxone 7/31 >> 7/31  Vancomycin 7/31 >>  Flagyl 7/31 >> 7/31  Clindamycin 7/31 >>  Zosyn 7/31 >>   Cultures:   7/30 Blood cultures x2: No growth  7/30 Urine culture: Enterobacter  7/31 intraoperative culture: Polymicrobial      Subjective: No new  fever, vomiting, confusion.  Her buttock pain is much better, she is still having a lot of loose stools and urinary incontinence.  No cough, dyspnea, chest pain.  Objective: Vitals:   06/30/18 2026  07/01/18 0516 07/01/18 0525 07/01/18 1220  BP: 125/70   116/69  Pulse: 62     Resp: 20   (!) 30  Temp: 97.7 F (36.5 C)  97.6 F (36.4 C)   TempSrc: Axillary  Axillary   SpO2: 98%     Weight:  111.8 kg (246 lb 7.6 oz)    Height:        Intake/Output Summary (Last 24 hours) at 07/01/2018 1510 Last data filed at 07/01/2018 0200 Gross per 24 hour  Intake 3352.86 ml  Output 350 ml  Net 3002.86 ml   Filed Weights   06/29/18 0500 06/30/18 0343 07/01/18 0516  Weight: 106.9 kg (235 lb 10.8 oz) 107 kg (235 lb 14.4 oz) 111.8 kg (246 lb 7.6 oz)    Examination: General appearance: Obese adult female, lying in bed, interactive, awake.  Tired. HEENT: Anicteric, conjunctival pink, lids and lashes normal.  No nasal deformity, discharge, or epistaxis.  Lips moist, oropharynx moist, no oral lesions.  Hearing normal. Skin: Skin warm and dry, buttock wound not examined Cardiac: Regular rate and rhythm, no murmurs, no lower extremity edema Respiratory: Torrey rate and rhythm.  Lungs clear without rales or wheezes. Abdomen: Abdomen soft without focal tenderness, voluntary guarding, or hepatosplenomegaly. MSK: No deformities or effusions. Neuro: Alert, extraocular movements intact, upper extremity movement globally weak but symmetric.  Speech fluent. Psych: Intact and responding to questions, intention diminished, affect blunted, judgment and insight appear normal.    Data Reviewed: I have personally reviewed following labs and imaging studies:  CBC: Recent Labs  Lab 06/27/18 2332 06/28/18 1518 06/29/18 0432 06/30/18 0422 07/01/18 0311  WBC 8.2 8.7 11.8* 15.4* 20.4*  HGB 8.8* 8.0* 7.3* 8.0* 8.3*  HCT 29.0* 25.0* 22.4* 24.3* 26.1*  MCV 100.7* 94.0 92.6 92.7 93.9  PLT 107* 104* 87* 102* 101*   Basic Metabolic Panel: Recent Labs  Lab 06/27/18 0003 06/27/18 0416 06/27/18 2332 06/29/18 0432 06/30/18 0422 07/01/18 0311  NA  --  141 143 145 144 143  K  --  4.6 4.3 3.7 3.9 4.8  CL  --   107 112* 114* 117* 117*  CO2  --  21* 16* 14* 15* 19*  GLUCOSE  --  85 103* 131* 150* 108*  BUN  --  96* 94* 81* 73* 72*  CREATININE  --  5.04* 3.82* 2.62* 2.07* 1.91*  CALCIUM  --  7.8* 7.5* 8.0* 8.3* 8.0*  MG 2.1  --  2.0 2.1  --   --   PHOS  --   --   --  5.1*  --   --    GFR: Estimated Creatinine Clearance: 24.9 mL/min (A) (by C-G formula based on SCr of 1.91 mg/dL (H)). Liver Function Tests: Recent Labs  Lab 06/26/18 2106 06/27/18 0416  AST 22 19  ALT 13 12  ALKPHOS 131* 101  BILITOT 1.6* 1.3*  PROT 6.1* 5.3*  ALBUMIN 2.7* 2.2*   No results for input(s): LIPASE, AMYLASE in the last 168 hours. No results for input(s): AMMONIA in the last 168 hours. Coagulation Profile: Recent Labs  Lab 06/26/18 2106  INR 1.24   Cardiac Enzymes: Recent Labs  Lab 06/27/18 0003 06/27/18 0416 06/27/18 1039  TROPONINI <0.03 <0.03 0.03*   BNP (last 3 results) No results  for input(s): PROBNP in the last 8760 hours. HbA1C: No results for input(s): HGBA1C in the last 72 hours. CBG: Recent Labs  Lab 06/27/18 2228 06/29/18 0936  GLUCAP 98 119*   Lipid Profile: No results for input(s): CHOL, HDL, LDLCALC, TRIG, CHOLHDL, LDLDIRECT in the last 72 hours. Thyroid Function Tests: No results for input(s): TSH, T4TOTAL, FREET4, T3FREE, THYROIDAB in the last 72 hours. Anemia Panel: No results for input(s): VITAMINB12, FOLATE, FERRITIN, TIBC, IRON, RETICCTPCT in the last 72 hours. Urine analysis:    Component Value Date/Time   COLORURINE YELLOW 06/27/2018 0043   APPEARANCEUR HAZY (A) 06/27/2018 0043   LABSPEC 1.012 06/27/2018 0043   PHURINE 5.0 06/27/2018 0043   GLUCOSEU NEGATIVE 06/27/2018 0043   HGBUR NEGATIVE 06/27/2018 0043   BILIRUBINUR NEGATIVE 06/27/2018 0043   KETONESUR NEGATIVE 06/27/2018 0043   PROTEINUR NEGATIVE 06/27/2018 0043   NITRITE NEGATIVE 06/27/2018 0043   LEUKOCYTESUR SMALL (A) 06/27/2018 0043   Sepsis  Labs: @LABRCNTIP (procalcitonin:4,lacticacidven:4)  ) Recent Results (from the past 240 hour(s))  Urine Culture     Status: Abnormal   Collection Time: 06/27/18 12:43 AM  Result Value Ref Range Status   Specimen Description URINE, RANDOM  Final   Special Requests   Final    NONE Performed at [facility], [address], [place], [place] [address]    Culture >=100,000 COLONIES/mL ENTEROBACTER CLOACAE (A)  Final   Report Status 06/29/2018 FINAL  Final   Organism ID, Bacteria ENTEROBACTER CLOACAE (A)  Final      Susceptibility   Enterobacter cloacae - MIC*    CEFAZOLIN >=64 RESISTANT Resistant     CEFTRIAXONE 2 SENSITIVE Sensitive     CIPROFLOXACIN <=0.25 SENSITIVE Sensitive     GENTAMICIN <=1 SENSITIVE Sensitive     IMIPENEM <=0.25 SENSITIVE Sensitive     NITROFURANTOIN 64 INTERMEDIATE Intermediate     TRIMETH/SULFA <=20 SENSITIVE Sensitive     PIP/TAZO 8 SENSITIVE Sensitive     * >=100,000 COLONIES/mL ENTEROBACTER CLOACAE  MRSA PCR Screening     Status: None   Collection Time: 06/27/18  3:16 AM  Result Value Ref Range Status   MRSA by PCR NEGATIVE NEGATIVE Final    Comment:        The GeneXpert MRSA Assay (FDA approved for NASAL specimens only), is one component of a comprehensive MRSA colonization surveillance program. It is not intended to diagnose MRSA infection nor to guide or monitor treatment for MRSA infections. Performed at [facility], [address] [place], [place] [address]   Culture, blood (Routine X 2) w Reflex to ID Panel     Status: None (Preliminary result)   Collection Time: 06/27/18  4:05 AM  Result Value Ref Range Status   Specimen Description BLOOD LEFT ANTECUBITAL  Final   Special Requests IN PEDIATRIC BOTTLE Blood Culture adequate volume  Final   Culture   Final    NO GROWTH 4 DAYS Performed at [facility], [address], [place], [place] [address]    Report Status PENDING  Incomplete  Culture, blood (Routine X 2) w  Reflex to ID Panel     Status: None (Preliminary result)   Collection Time: 06/27/18  4:20 AM  Result Value Ref Range Status   Specimen Description BLOOD LEFT HAND  Final   Special Requests IN PEDIATRIC BOTTLE Blood Culture adequate volume  Final   Culture   Final    NO GROWTH 4 DAYS Performed at [facility], [address] [place], [place] [address]    Report Status PENDING  Incomplete  Fungus Culture With Stain     Status: None (Preliminary result)   Collection Time: 06/27/18  6:01 PM  Result Value Ref Range Status   Fungus Stain Final report  Final    Comment: (NOTE) Performed At: [facility] [address] [place], [place] [id number] Jolene SchimkeNagendra Sanjai MD XB:[id number]:[phone]    Fungus (Mycology) Culture PENDING  Incomplete   Fungal Source ABSCESS  Final    Comment: LEFT BUTTOCKS ON VANC Performed at [facility], [address] [place], [place] [address]   Aerobic/Anaerobic Culture (surgical/deep wound)     Status: None   Collection Time: 06/27/18  6:01 PM  Result Value Ref Range Status   Specimen Description ABSCESS  Final   Special Requests NONE  Final   Gram Stain   Final    NO WBC SEEN MODERATE GRAM POSITIVE COCCI IN PAIRS MODERATE GRAM NEGATIVE COCCOBACILLI Performed at [facility], [address] [place], [place] [address]    Culture   Final    MODERATE BACTEROIDES SPECIES BETA LACTAMASE POSITIVE RARE KLEBSIELLA PNEUMONIAE    Report Status 07/01/2018 FINAL  Final   Organism ID, Bacteria KLEBSIELLA PNEUMONIAE  Final      Susceptibility   Klebsiella pneumoniae - MIC*    AMPICILLIN RESISTANT Resistant     CEFAZOLIN <=4 SENSITIVE Sensitive     CEFEPIME <=1 SENSITIVE Sensitive     CEFTAZIDIME <=1 SENSITIVE Sensitive     CEFTRIAXONE <=1 SENSITIVE Sensitive     CIPROFLOXACIN <=0.25 SENSITIVE Sensitive     GENTAMICIN <=1 SENSITIVE Sensitive     IMIPENEM <=0.25 SENSITIVE Sensitive     TRIMETH/SULFA <=20 SENSITIVE Sensitive      AMPICILLIN/SULBACTAM <=2 SENSITIVE Sensitive     PIP/TAZO <=4 SENSITIVE Sensitive     Extended ESBL NEGATIVE Sensitive     * RARE KLEBSIELLA PNEUMONIAE  Fungus Culture Result     Status: None   Collection Time: 06/27/18  6:01 PM  Result Value Ref Range Status   Result 1 Comment  Final    Comment: (NOTE) KOH/Calcofluor preparation:  no fungus observed. Performed At: [facility] [address] [place], [place] [id number] Jolene SchimkeNagendra Sanjai MD KG:[id number]:[phone]   Surgical pcr screen     Status: None   Collection Time: 06/29/18  8:56 AM  Result Value Ref Range Status   MRSA, PCR NEGATIVE NEGATIVE Final   Staphylococcus aureus NEGATIVE NEGATIVE Final    Comment: (NOTE) The Xpert SA Assay (FDA approved for NASAL specimens in patients 82 years of age and older), is one component of a comprehensive surveillance program. It is not intended to diagnose infection nor to guide or monitor treatment. Performed at [facility], [address] [place], [place] [address]          Radiology Studies: No results found.      Scheduled Meds: . allopurinol  100 mg Oral Daily  . collagenase   Topical BID  . levothyroxine  137 mcg Oral QAC breakfast  . pantoprazole  40 mg Oral Daily  . sertraline  50 mg Oral Daily   Continuous Infusions: . sodium chloride 50 mL/hr at 07/01/18 1018  . clindamycin (CLEOCIN) IV 600 mg (07/01/18 0533)  . piperacillin-tazobactam (ZOSYN)  IV 3.375 g (07/01/18 0532)  . vancomycin 1,500 mg (07/01/18 1018)     LOS: 5 days    Time spent: 25 minutes  Alberteen Sam, MD [facility] 07/01/2018, 3:10 PM     Pager [phone] --- please page though AMION:  [email or web address] If 7PM-7AM, please contact night-coverage

## 2018-07-01 NOTE — Consult Note (Signed)
Urology Consult  Referring physician: Dr. 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 She has undergone 2 debridements and is supposed to undergo diverting colostomy this week for management of her wound. Urology was consulted for SP tube placement. The patient has a hx of appendectomy, cholecystectomy and tubal ligation. Prior to admission which was incontinent of urine and would soak 5-6 pads per day. No issues with recurrent UTIs or hematuria. Currently she has an indwelling foley.    Past Medical History:  Diagnosis Date  . Chest pain   . Depression   . GERD (gastroesophageal reflux disease)   . Hypertension   . Sleep apnea   . Thyroid disease    Past Surgical History:  Procedure Laterality Date  . APPENDECTOMY  1946  . CATARACT EXTRACTION     bilateral  . GALLBLADDER SURGERY    . INCISION AND DRAINAGE ABSCESS N/A 06/29/2018   Procedure: REPEAT INCISION AND DRAINAGE  BUTTOCK ABSCESS;  Surgeon: Erroll Luna, MD;  Location: [facility];  Service: General;  Laterality: N/A;  . INCISION AND DRAINAGE PERIRECTAL ABSCESS Left 06/27/2018   Procedure: IRRIGATION AND DEBRIDEMENT BUTTOCK ABSCESS;  Surgeon: Ileana Roup, MD;  Location: [facility];  Service: General;  Laterality: Left;  . KNEE ARTHROPLASTY  2006   left   . KNEE ARTHROPLASTY  2006   right   . SHOULDER ARTHROSCOPY  2009   right  . TOTAL HIP ARTHROPLASTY  2002    left    Medications: I have reviewed the patient's current medications. Allergies:  Allergies  Allergen Reactions  . Morphine And Related Hives and Itching  . Gabapentin Other (See Comments)    Alters mood  . Lipitor [Atorvastatin] Itching and Rash  . Lyrica [Pregabalin] Other (See Comments)    Lethargic, mood changes  . Sulfa Antibiotics Rash    Family History  Problem Relation Age  of Onset  . Diabetes Mother   . Stroke Mother   . Cancer Brother   . Kidney disease Sister    Social History:  reports that she has quit smoking. She has never used smokeless tobacco. She reports that she does not drink alcohol or use drugs.  Review of Systems  Gastrointestinal: Positive for abdominal pain.  All other systems reviewed and are negative.   Physical Exam:  Vital signs in last 24 hours: Temp:  [97.6 F (36.4 C)-97.7 F (36.5 C)] 97.6 F (36.4 C) (08/04 0525) Pulse Rate:  [58-62] 62 (08/03 2026) Resp:  [18-30] 30 (08/04 1220) BP: (116-125)/(69-70) 116/69 (08/04 1220) SpO2:  [96 %-98 %] 98 % (08/03 2026) Weight:  [111.8 kg (246 lb 7.6 oz)] 111.8 kg (246 lb 7.6 oz) (08/04 0516) Physical Exam  Constitutional: She is oriented to person, place, and time. She appears well-developed and well-nourished.  HENT:  Head: Normocephalic and atraumatic.  Eyes: Pupils are equal, round, and reactive to light. EOM are normal.  Neck: Normal range of motion. No thyromegaly present.  Cardiovascular: Normal rate and regular rhythm.  Respiratory: Effort normal. No respiratory distress.  GI: Soft. She exhibits no distension. There is no tenderness.  Musculoskeletal: Normal range of motion. She exhibits no edema.  Neurological: She is alert and oriented to person, place, and time.  Skin: Skin is warm and dry.  Psychiatric: She has a normal mood and affect. Her  behavior is normal. Judgment and thought content normal.    Laboratory Data:  Results for orders placed or performed during the hospital encounter of 06/26/18 (from the past 72 hour(s))  CBC     Status: Abnormal   Collection Time: 06/29/18  4:32 AM  Result Value Ref Range   WBC 11.8 (H) 4.0 - 10.5 K/uL   RBC 2.42 (L) 3.87 - 5.11 MIL/uL   Hemoglobin 7.3 (L) 12.0 - 15.0 g/dL   HCT 22.4 (L) 36.0 - 46.0 %   MCV 92.6 78.0 - 100.0 fL   MCH 30.2 26.0 - 34.0 pg   MCHC 32.6 30.0 - 36.0 g/dL   RDW 15.0 11.5 - 15.5 %   Platelets 87  (L) 150 - 400 K/uL    Comment: REPEATED TO VERIFY CONSISTENT WITH PREVIOUS RESULT Performed at [facility], [address], [place], [place] [address]   Basic metabolic panel     Status: Abnormal   Collection Time: 06/29/18  4:32 AM  Result Value Ref Range   Sodium 145 135 - 145 mmol/L   Potassium 3.7 3.5 - 5.1 mmol/L   Chloride 114 (H) 98 - 111 mmol/L   CO2 14 (L) 22 - 32 mmol/L   Glucose, Bld 131 (H) 70 - 99 mg/dL   BUN 81 (H) 8 - 23 mg/dL   Creatinine, Ser 2.62 (H) 0.44 - 1.00 mg/dL    Comment: DELTA CHECK NOTED   Calcium 8.0 (L) 8.9 - 10.3 mg/dL   GFR calc non Af Amer 15 (L) >60 mL/min   GFR calc Af Amer 18 (L) >60 mL/min    Comment: (NOTE) The eGFR has been calculated using the CKD EPI equation. This calculation has not been validated in all clinical situations. eGFR's persistently <60 mL/min signify possible Chronic Kidney Disease.    Anion gap 17 (H) 5 - 15    Comment: Performed at [facility], [place] [address], [place], [place] [address]  Procalcitonin     Status: None   Collection Time: 06/29/18  4:32 AM  Result Value Ref Range   Procalcitonin 6.94 ng/mL    Comment:        Interpretation: PCT > 2 ng/mL: Systemic infection (sepsis) is likely, unless other causes are known. (NOTE)       Sepsis PCT Algorithm           Lower Respiratory Tract                                      Infection PCT Algorithm    ----------------------------     ----------------------------         PCT < 0.25 ng/mL                PCT < 0.10 ng/mL         Strongly encourage             Strongly discourage   discontinuation of antibiotics    initiation of antibiotics    ----------------------------     -----------------------------       PCT 0.25 - 0.50 ng/mL            PCT 0.10 - 0.25 ng/mL               OR       >80% decrease in PCT  Discourage initiation of                                            antibiotics      Encourage discontinuation           of  antibiotics    ----------------------------     -----------------------------         PCT >= 0.50 ng/mL              PCT 0.26 - 0.50 ng/mL               AND       <80% decrease in PCT              Encourage initiation of                                             antibiotics       Encourage continuation           of antibiotics    ----------------------------     -----------------------------        PCT >= 0.50 ng/mL                  PCT > 0.50 ng/mL               AND         increase in PCT                  Strongly encourage                                      initiation of antibiotics    Strongly encourage escalation           of antibiotics                                     -----------------------------                                           PCT <= 0.25 ng/mL                                                 OR                                        > 80% decrease in PCT                                     Discontinue / Do not initiate  antibiotics Performed at [facility], [place] [address], [place], [place] [address]   Magnesium     Status: None   Collection Time: 06/29/18  4:32 AM  Result Value Ref Range   Magnesium 2.1 1.7 - 2.4 mg/dL    Comment: Performed at [facility] [address] [place], [place] [address]  Phosphorus     Status: Abnormal   Collection Time: 06/29/18  4:32 AM  Result Value Ref Range   Phosphorus 5.1 (H) 2.5 - 4.6 mg/dL    Comment: Performed at [facility] [address] [place], [place] [address]  Vancomycin, random     Status: None   Collection Time: 06/29/18  4:32 AM  Result Value Ref Range   Vancomycin Rm 11     Comment:        Random Vancomycin therapeutic range is dependent on dosage and time of specimen collection. A peak range is 20.0-40.0 ug/mL A trough range is 5.0-15.0 ug/mL        Performed at [facility] [address], [place], [place] [address]    Surgical pcr screen     Status: None   Collection Time: 06/29/18  8:56 AM  Result Value Ref Range   MRSA, PCR NEGATIVE NEGATIVE   Staphylococcus aureus NEGATIVE NEGATIVE    Comment: (NOTE) The Xpert SA Assay (FDA approved for NASAL specimens in patients 18 years of age and older), is one component of a comprehensive surveillance program. It is not intended to diagnose infection nor to guide or monitor treatment. Performed at [facility], [place] [address], [place], [place] [address]   Glucose, capillary     Status: Abnormal   Collection Time: 06/29/18  9:36 AM  Result Value Ref Range   Glucose-Capillary 119 (H) 70 - 99 mg/dL  Lactic acid, plasma     Status: None   Collection Time: 06/29/18  9:42 AM  Result Value Ref Range   Lactic Acid, Venous 1.0 0.5 - 1.9 mmol/L    Comment: Performed at [facility], [place] [address] [place], [place] [address]  Basic metabolic panel     Status: Abnormal   Collection Time: 06/30/18  4:22 AM  Result Value Ref Range   Sodium 144 135 - 145 mmol/L   Potassium 3.9 3.5 - 5.1 mmol/L   Chloride 117 (H) 98 - 111 mmol/L   CO2 15 (L) 22 - 32 mmol/L   Glucose, Bld 150 (H) 70 - 99 mg/dL   BUN 73 (H) 8 - 23 mg/dL   Creatinine, Ser 2.07 (H) 0.44 - 1.00 mg/dL   Calcium 8.3 (L) 8.9 - 10.3 mg/dL   GFR calc non Af Amer 20 (L) >60 mL/min   GFR calc Af Amer 23 (L) >60 mL/min    Comment: (NOTE) The eGFR has been calculated using the CKD EPI equation. This calculation has not been validated in all clinical situations. eGFR's persistently <60 mL/min signify possible Chronic Kidney Disease.    Anion gap 12 5 - 15    Comment: Performed at [facility] [address], [place],  [address]  Procalcitonin     Status: None   Collection Time: 06/30/18  4:22 AM  Result Value Ref Range   Procalcitonin 4.33 ng/mL    Comment:        Interpretation: PCT > 2 ng/mL: Systemic infection (sepsis) is likely, unless other causes are known. (NOTE)  Sepsis PCT Algorithm           Lower Respiratory Tract                                      Infection PCT Algorithm    ----------------------------     ----------------------------         PCT < 0.25 ng/mL                PCT < 0.10 ng/mL         Strongly encourage             Strongly discourage   discontinuation of antibiotics    initiation of antibiotics    ----------------------------     -----------------------------       PCT 0.25 - 0.50 ng/mL            PCT 0.10 - 0.25 ng/mL               OR       >80% decrease in PCT            Discourage initiation of                                            antibiotics      Encourage discontinuation           of antibiotics    ----------------------------     -----------------------------         PCT >= 0.50 ng/mL              PCT 0.26 - 0.50 ng/mL               AND       <80% decrease in PCT              Encourage initiation of                                             antibiotics       Encourage continuation           of antibiotics    ----------------------------     -----------------------------        PCT >= 0.50 ng/mL                  PCT > 0.50 ng/mL               AND         increase in PCT                  Strongly encourage                                      initiation of antibiotics    Strongly encourage escalation           of antibiotics                                     -----------------------------  PCT <= 0.25 ng/mL                                                 OR                                        > 80% decrease in PCT                                     Discontinue / Do not initiate                                             antibiotics Performed at [facility], [place] [address], [place], [place] [address]   CBC     Status: Abnormal   Collection Time: 06/30/18  4:22 AM  Result Value Ref Range   WBC 15.4 (H) 4.0 - 10.5 K/uL   RBC 2.62 (L) 3.87 - 5.11  MIL/uL   Hemoglobin 8.0 (L) 12.0 - 15.0 g/dL   HCT 24.3 (L) 36.0 - 46.0 %   MCV 92.7 78.0 - 100.0 fL   MCH 30.5 26.0 - 34.0 pg   MCHC 32.9 30.0 - 36.0 g/dL   RDW 15.6 (H) 11.5 - 15.5 %   Platelets 102 (L) 150 - 400 K/uL    Comment: CONSISTENT WITH PREVIOUS RESULT Performed at [facility], [place] [address], [place], [place] [address]   Vancomycin, trough     Status: None   Collection Time: 06/30/18  1:37 PM  Result Value Ref Range   Vancomycin Tr 16 15 - 20 ug/mL    Comment: Performed at [facility] [address], [place], [place] [address]  Basic metabolic panel     Status: Abnormal   Collection Time: 07/01/18  3:11 AM  Result Value Ref Range   Sodium 143 135 - 145 mmol/L   Potassium 4.8 3.5 - 5.1 mmol/L    Comment: SPECIMEN HEMOLYZED. HEMOLYSIS MAY AFFECT INTEGRITY OF RESULTS.   Chloride 117 (H) 98 - 111 mmol/L   CO2 19 (L) 22 - 32 mmol/L   Glucose, Bld 108 (H) 70 - 99 mg/dL   BUN 72 (H) 8 - 23 mg/dL   Creatinine, Ser 1.91 (H) 0.44 - 1.00 mg/dL   Calcium 8.0 (L) 8.9 - 10.3 mg/dL   GFR calc non Af Amer 22 (L) >60 mL/min   GFR calc Af Amer 26 (L) >60 mL/min    Comment: (NOTE) The eGFR has been calculated using the CKD EPI equation. This calculation has not been validated in all clinical situations. eGFR's persistently <60 mL/min signify possible Chronic Kidney Disease.    Anion gap 7 5 - 15    Comment: Performed at [facility] [address], [place] [address]  CBC     Status: Abnormal   Collection Time: 07/01/18  3:11 AM  Result Value Ref Range   WBC 20.4 (H) 4.0 - 10.5 K/uL   RBC 2.78 (L) 3.87 - 5.11 MIL/uL   Hemoglobin 8.3 (L) 12.0 -  15.0 g/dL   HCT 26.1 (L) 36.0 - 46.0 %   MCV 93.9 78.0 - 100.0 fL   MCH 29.9 26.0 - 34.0 pg   MCHC 31.8 30.0 - 36.0 g/dL   RDW 15.7 (H) 11.5 - 15.5 %   Platelets 101 (L) 150 - 400 K/uL    Comment: CONSISTENT WITH PREVIOUS RESULT Performed at [facility], [place] [address], [place], [place]  [address]    Recent Results (from the past 240 hour(s))  Urine Culture     Status: Abnormal   Collection Time: 06/27/18 12:43 AM  Result Value Ref Range Status   Specimen Description URINE, RANDOM  Final   Special Requests   Final    NONE Performed at [facility], [place] [address], [place], [place] [address]    Culture >=100,000 COLONIES/mL ENTEROBACTER CLOACAE (A)  Final   Report Status 06/29/2018 FINAL  Final   Organism ID, Bacteria ENTEROBACTER CLOACAE (A)  Final      Susceptibility   Enterobacter cloacae - MIC*    CEFAZOLIN >=64 RESISTANT Resistant     CEFTRIAXONE 2 SENSITIVE Sensitive     CIPROFLOXACIN <=0.25 SENSITIVE Sensitive     GENTAMICIN <=1 SENSITIVE Sensitive     IMIPENEM <=0.25 SENSITIVE Sensitive     NITROFURANTOIN 64 INTERMEDIATE Intermediate     TRIMETH/SULFA <=20 SENSITIVE Sensitive     PIP/TAZO 8 SENSITIVE Sensitive     * >=100,000 COLONIES/mL ENTEROBACTER CLOACAE  MRSA PCR Screening     Status: None   Collection Time: 06/27/18  3:16 AM  Result Value Ref Range Status   MRSA by PCR NEGATIVE NEGATIVE Final    Comment:        The GeneXpert MRSA Assay (FDA approved for NASAL specimens only), is one component of a comprehensive MRSA colonization surveillance program. It is not intended to diagnose MRSA infection nor to guide or monitor treatment for MRSA infections. Performed at [facility], [place] [address], [place], [place] [address]   Culture, blood (Routine X 2) w Reflex to ID Panel     Status: None (Preliminary result)   Collection Time: 06/27/18  4:05 AM  Result Value Ref Range Status   Specimen Description BLOOD LEFT ANTECUBITAL  Final   Special Requests IN PEDIATRIC BOTTLE Blood Culture adequate volume  Final   Culture   Final    NO GROWTH 4 DAYS Performed at [facility], [place] [address], [place], [place] [address]    Report Status PENDING  Incomplete  Culture, blood (Routine X 2) w Reflex to ID Panel     Status: None (Preliminary  result)   Collection Time: 06/27/18  4:20 AM  Result Value Ref Range Status   Specimen Description BLOOD LEFT HAND  Final   Special Requests IN PEDIATRIC BOTTLE Blood Culture adequate volume  Final   Culture   Final    NO GROWTH 4 DAYS Performed at [facility], [place] [address], [place],  [address]    Report Status PENDING  Incomplete  Fungus Culture With Stain     Status: None (Preliminary result)   Collection Time: 06/27/18  6:01 PM  Result Value Ref Range Status   Fungus Stain Final report  Final    Comment: (NOTE) Performed At: [facility], [place] [id number] Rush Farmer MD QM:[id number]    Fungus (Mycology) Culture PENDING  Incomplete   Fungal Source ABSCESS  Final    Comment:  LEFT BUTTOCKS ON VANC Performed at [facility], [place] [address] [place], [place] [address]   Aerobic/Anaerobic Culture (surgical/deep wound)     Status: None   Collection Time: 06/27/18  6:01 PM  Result Value Ref Range Status   Specimen Description ABSCESS  Final   Special Requests NONE  Final   Gram Stain   Final    NO WBC SEEN MODERATE GRAM POSITIVE COCCI IN PAIRS MODERATE GRAM NEGATIVE COCCOBACILLI Performed at [facility], [address] [place], [place] [address]    Culture   Final    MODERATE BACTEROIDES SPECIES BETA LACTAMASE POSITIVE RARE KLEBSIELLA PNEUMONIAE    Report Status 07/01/2018 FINAL  Final   Organism ID, Bacteria KLEBSIELLA PNEUMONIAE  Final      Susceptibility   Klebsiella pneumoniae - MIC*    AMPICILLIN RESISTANT Resistant     CEFAZOLIN <=4 SENSITIVE Sensitive     CEFEPIME <=1 SENSITIVE Sensitive     CEFTAZIDIME <=1 SENSITIVE Sensitive     CEFTRIAXONE <=1 SENSITIVE Sensitive     CIPROFLOXACIN <=0.25 SENSITIVE Sensitive     GENTAMICIN <=1 SENSITIVE Sensitive     IMIPENEM <=0.25 SENSITIVE Sensitive     TRIMETH/SULFA <=20 SENSITIVE Sensitive     AMPICILLIN/SULBACTAM <=2 SENSITIVE Sensitive     PIP/TAZO <=4  SENSITIVE Sensitive     Extended ESBL NEGATIVE Sensitive     * RARE KLEBSIELLA PNEUMONIAE  Fungus Culture Result     Status: None   Collection Time: 06/27/18  6:01 PM  Result Value Ref Range Status   Result 1 Comment  Final    Comment: (NOTE) KOH/Calcofluor preparation:  no fungus observed. Performed At: [facility] [place], [place] [id number] Rush Farmer MD [id number]   Surgical pcr screen     Status: None   Collection Time: 06/29/18  8:56 AM  Result Value Ref Range Status   MRSA, PCR NEGATIVE NEGATIVE Final   Staphylococcus aureus NEGATIVE NEGATIVE Final    Comment: (NOTE) The Xpert SA Assay (FDA approved for NASAL specimens in patients 71 years of age and older), is one component of a comprehensive surveillance program. It is not intended to diagnose infection nor to guide or monitor treatment. Performed at [facility], [place] [address] [place], [place] [address]    Creatinine: Recent Labs    06/26/18 2106 06/27/18 0416 06/27/18 2332 06/29/18 0432 06/30/18 0422 07/01/18 0311  CREATININE 5.59* 5.04* 3.82* 2.62* 2.07* 1.91*   Baseline Creatinine: 1.5  Impression/Assessment:  84yo with founiers gangrene, urinary incontinence requiring chronic indwelling foley catheter  Plan:  1. I discussed bladder management with the patient and daughter including chronic indwelling foley versus SP tube. The patient and daughter wish to proceed with SP tube to promote wound healing. We will coordinate with general surgery for placement of her SP tube. If SP tube placement is not feasible at the time of her colostomy then we will consult IR for SP tube placement  [name] 07/01/2018, 5:16 PM

## 2018-07-02 ENCOUNTER — Other Ambulatory Visit: Payer: Self-pay | Admitting: Urology

## 2018-07-02 LAB — BASIC METABOLIC PANEL
ANION GAP: 10 (ref 5–15)
BUN: 59 mg/dL — ABNORMAL HIGH (ref 8–23)
CALCIUM: 8.1 mg/dL — AB (ref 8.9–10.3)
CHLORIDE: 117 mmol/L — AB (ref 98–111)
CO2: 16 mmol/L — AB (ref 22–32)
Creatinine, Ser: 1.76 mg/dL — ABNORMAL HIGH (ref 0.44–1.00)
GFR calc non Af Amer: 25 mL/min — ABNORMAL LOW (ref >60)
GFR, EST AFRICAN AMERICAN: 29 mL/min — AB (ref >60)
Glucose, Bld: 103 mg/dL — ABNORMAL HIGH (ref 70–99)
Potassium: 3.9 mmol/L (ref 3.5–5.1)
SODIUM: 143 mmol/L (ref 135–145)

## 2018-07-02 LAB — CBC
HCT: 25.2 % — ABNORMAL LOW (ref 36.0–46.0)
HEMOGLOBIN: 8.2 g/dL — AB (ref 12.0–15.0)
MCH: 30.8 pg (ref 26.0–34.0)
MCHC: 32.5 g/dL (ref 30.0–36.0)
MCV: 94.7 fL (ref 78.0–100.0)
PLATELETS: 109 10*3/uL — AB (ref 150–400)
RBC: 2.66 MIL/uL — ABNORMAL LOW (ref 3.87–5.11)
RDW: 15.6 % — ABNORMAL HIGH (ref 11.5–15.5)
WBC: 21.4 10*3/uL — AB (ref 4.0–10.5)

## 2018-07-02 LAB — CULTURE, BLOOD (ROUTINE X 2)
CULTURE: NO GROWTH
CULTURE: NO GROWTH
SPECIAL REQUESTS: ADEQUATE
SPECIAL REQUESTS: ADEQUATE

## 2018-07-02 LAB — VANCOMYCIN, TROUGH: VANCOMYCIN TR: 28 ug/mL — AB (ref 15–20)

## 2018-07-02 NOTE — Consult Note (Addendum)
WOC Nurse requested for preoperative stoma site marking  Discussed surgical procedure and stoma creation with patient and family.  Explained role of the WOC nurse team.  Provided the patient with educational booklet and provided samples of pouching options.  Answered patient and family questions.   Examined patient lying and sitting upright. Attempted to mark below the patient's belt line.   Marked for colostomy in the LLQ  __5__ cm to the left of the umbilicus and __2__cm below the umbilicus.  Marked for possible ileal conduit, according to family members, in the RLQ __5__  cm to the right of the umbilicus and  ___2_ cm /below the umbilicus.  Patient's abdomen cleansed with CHG wipes at site markings, allowed to air dry prior to marking.   There is a crease which occurs below the marks and should be avoided if possible. WOC Nurse team will follow up with patient after surgery for continue ostomy care and teaching.  Cammie Mcgeeawn Staci Dack MSN, RN, CWOCN, [place]CN-AP, CNS [phone]

## 2018-07-02 NOTE — Progress Notes (Signed)
ANTIBIOTIC CONSULT NOTE - INITIAL  Pharmacy Consult for Vanco/Zosyn Indication: Fourniers Gangrene  Allergies  Allergen Reactions  . Morphine And Related Hives and Itching  . Gabapentin Other (See Comments)    Alters mood  . Lipitor [Atorvastatin] Itching and Rash  . Lyrica [Pregabalin] Other (See Comments)    Lethargic, mood changes  . Sulfa Antibiotics Rash    Patient Measurements: Height: 5\' 4"  (162.6 cm) Weight: 246 lb 11.1 oz (111.9 kg) IBW/kg (Calculated) : 54.7 Adjusted Body Weight:    Vital Signs: Temp: 98.1 F (36.7 C) (08/05 0741) Temp Source: Oral (08/05 0741) BP: 106/61 (08/05 0741) Pulse Rate: 44 (08/05 0741) Intake/Output from previous day: 08/04 0701 - 08/05 0700 In: 2005.2 [P.O.:140; I.V.:1544.6; IV Piggyback:320.7] Out: 825 [Urine:825] Intake/Output from this shift: No intake/output data recorded.  Labs: Recent Labs    06/30/18 0422 07/01/18 0311 07/02/18 0356  WBC 15.4* 20.4* 21.4*  HGB 8.0* 8.3* 8.2*  PLT 102* 101* 109*  CREATININE 2.07* 1.91* 1.76*   Estimated Creatinine Clearance: 27.1 mL/min (A) (by C-G formula based on SCr of 1.76 mg/dL (H)). Recent Labs    06/30/18 1337 07/02/18 0356  VANCOTROUGH 16 28*     Microbiology:   Medical History: Past Medical History:  Diagnosis Date  . Chest pain   . Depression   . GERD (gastroesophageal reflux disease)   . Hypertension   . Sleep apnea   . 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 Large wound of sacrum and perineum, including anal sphincter. Also has Enterobacter UTI - Scr 1.76 down - s/p OR 7/31; and back to OR on 8/2 for repeat I&D. - Afeb, WBC up 21.4, LA 3.0>>1.0, PCT 9.17>4.33  Vancomycin 7/31>> Zosyn 8/1 >> Clindamycin 8/1 >> Ceftriaxone7/31>>7/31 Flagyl 7/31>>7/31  8/2 vanc random: 11 8/3 vanc random: 16 8/5: Vanco level 28 (MD ordered, not a trough) ?  7/31 abscess  - Klebsiella, bacteroides-R  ampicillin only 7/31Blood x 2 - ng x 2 days to date 7/31 urine - > 100 K/ml Enterobacter - sens CTX, Cipro, Gent (MIC <1), Imi, Septra, Zosyn. Intermediate to Nitrofurantoin, Resistant to Cefazolin 7/31 and 8/2 MRSA PCR - negative   Goal of Therapy:  Vancomycin trough level 15-20 mcg/ml  Plan: Continue Zosyn to 3.375 IV q8h Vanc 1500 mg q48h. Trough at the end of the week. Clindamycin 600mg  IV q 8hrs  Fatma Rutten S. Merilynn Finlandobertson, PharmD, [facility] Center[facility] Clinical Staff Pharmacist Pager [phone]  Misty Stanleyobertson, Darletta Noblett Stillinger 07/02/2018,9:40 AM

## 2018-07-02 NOTE — Progress Notes (Signed)
CRITICAL VALUE ALERT  Critical Value:  Vanc Tr 28  Date & Time Notied:  07/02/18 at 0510  Provider Notified: Opyd  Orders Received/Actions taken: No orders were given

## 2018-07-02 NOTE — [place] FL2 (Signed)
[place] MEDICAID FL2 LEVEL OF CARE SCREENING TOOL     IDENTIFICATION  Patient Name: [name] Birthdate: 05/16/1928 Sex: female Admission Date (Current Location): 06/26/2018  [facility] [place]   Facility and Address:  [facility] [place], [address], [place], [place] [address]      Provider Number: [id number]  Attending Physician Name and Address:  Alberteen Samanford, Christopher P, *  Relative Name and Phone Number:  De NurseCindy Barnes, [phone]    Current Level of Care: Hospital Recommended Level of Care: Skilled Nursing Facility Prior Approval Number:    Date Approved/Denied:   PASRR Number: [id number]  Discharge Plan: SNF    Current Diagnoses: Patient Active Problem List   Diagnosis Date Noted  . Pressure injury of skin 06/27/2018  . Cellulitis and abscess of buttock   . Gastrointestinal hemorrhage   . Hypotension 06/26/2018  . ARF (acute renal failure) (HCC) 06/26/2018  . Black stool 06/26/2018  . Anemia 06/26/2018  . Bacteremia 03/07/2016  . Atypical chest pain 03/07/2016  . Acute-on-chronic kidney injury (HCC) 03/07/2016  . Essential hypertension 03/07/2016  . Chronic diastolic congestive heart failure (HCC) 03/07/2016  . Hypothyroidism 03/07/2016  . Depression with anxiety 03/07/2016  . UTI (lower urinary tract infection)   . Near syncope 08/20/2015  . Morbid obesity (HCC) 08/20/2015  . Pain in the chest 07/06/2015  . Awareness alteration, transient 05/07/2015  . History of stroke 05/07/2015    Orientation RESPIRATION BLADDER Height & Weight     Self, Time, Situation, Place  Normal Indwelling catheter, Continent Weight: 246 lb 11.1 oz (111.9 kg) Height:  5\' 4"  (162.6 cm)  BEHAVIORAL SYMPTOMS/MOOD NEUROLOGICAL BOWEL NUTRITION STATUS      Continent Diet(see dc summary)  AMBULATORY STATUS COMMUNICATION OF NEEDS Skin   Limited Assist Verbally                         Personal Care Assistance Level of  Assistance  Bathing, Feeding, Dressing Bathing Assistance: Limited assistance Feeding assistance: Independent Dressing Assistance: Limited assistance     Functional Limitations Info  Sight, Hearing, Speech Sight Info: Adequate Hearing Info: Adequate Speech Info: Adequate    SPECIAL CARE FACTORS FREQUENCY  PT (By licensed PT), OT (By licensed OT)     PT Frequency: 5x wk OT Frequency: 5x wk            Contractures Contractures Info: Not present    Additional Factors Info  Code Status, Allergies Code Status Info: DNR Allergies Info: MORPHINE AND RELATED, GABAPENTIN, LIPITOR ATORVASTATIN, LYRICA PREGABALIN, SULFA ANTIBIOTICS           Current Medications (07/02/2018):  This is the current hospital active medication list Current Facility-Administered Medications  Medication Dose Route Frequency Provider Last Rate Last Dose  . 0.9 %  sodium chloride infusion   Intravenous Continuous Danford, Earl Liteshristopher P, MD 50 mL/hr at 07/02/18 0700    . acetaminophen (TYLENOL) tablet 650 mg  650 mg Oral Q6H PRN Cornett, Thomas, MD       Or  . acetaminophen (TYLENOL) suppository 650 mg  650 mg Rectal Q6H PRN Cornett, Thomas, MD      . allopurinol (ZYLOPRIM) tablet 100 mg  100 mg Oral Daily Cornett, Thomas, MD   100 mg at 07/02/18 0913  . collagenase (SANTYL) ointment   Topical BID Focht, Jessica L, PA   1 application at 07/02/18 0900  . fentaNYL (SUBLIMAZE) injection 25 mcg  25 mcg Intravenous Q2H PRN Harriette Bouillon, MD   25 mcg at 07/02/18 0752  . levothyroxine (SYNTHROID, LEVOTHROID) tablet 137 mcg  137 mcg Oral QAC breakfast Harriette Bouillon, MD   137 mcg at 07/02/18 [phone]  . pantoprazole (PROTONIX) EC tablet 40 mg  40 mg Oral Daily Alberteen Sam, MD   40 mg at 07/02/18 0913  . piperacillin-tazobactam (ZOSYN) IVPB 3.375 g  3.375 g Intravenous Q8H Rudisill, Toniann Fail, RPH 12.5 mL/hr at 07/02/18 1314 3.375 g at 07/02/18 1314  . sertraline (ZOLOFT) tablet 50 mg  50 mg Oral Daily Cornett,  Thomas, MD   50 mg at 07/02/18 0913  . traMADol (ULTRAM) tablet 50 mg  50 mg Oral Q6H PRN Harriette Bouillon, MD   50 mg at 07/02/18 1305     Discharge Medications: Please see discharge summary for a list of discharge medications.  Relevant Imaging Results:  Relevant Lab Results:   Additional Information SS#  [id number]  Althea Charon, LCSW

## 2018-07-02 NOTE — Evaluation (Signed)
Physical Therapy Evaluation Patient Details Name: [name] MRN: [id number] DOB: 1927-12-15 Today's Date: 07/02/2018   History of Present Illness  Pt is an 84 y/o female with PMH significant for HTN, hypothyroidism who presents with 1 week buttock pain, followed by malaise, diarrhea, and weakness. In ED, pt found to be hypotensive with leukocytosis and lactic acidosis. Pt is now s/p 2 I&D of buttock wound and is undergoing hydrotherapy. Plan for lap colostomy 8/6.  Clinical Impression  Pt admitted with above diagnosis. Pt currently with functional limitations due to the deficits listed below (see PT Problem List). At the time of PT eval pt had just finished hydrotherapy treatment and did not progress to OOB mobility. Anticipate pt will require short term rehab at the SNF level to maximize functional independence and reduce caregiver burden prior to return home with children. Will continue to assess for any further physical therapy needs, as pt is planned to go to OR tomorrow for diverting colostomy. Acutely, pt will benefit from skilled PT to increase their independence and safety with mobility to allow discharge to the venue listed below.       Follow Up Recommendations SNF;Supervision/Assistance - 24 hour    Equipment Recommendations  Other (comment)(TBD by next venue of care)    Recommendations for Other Services       Precautions / Restrictions Precautions Precautions: Fall Restrictions Weight Bearing Restrictions: No      Mobility  Bed Mobility Overal bed mobility: Needs Assistance Bed Mobility: Rolling Rolling: Min assist;+2 for physical assistance;+2 for safety/equipment;Total assist         General bed mobility comments: +2 min assist for rolling and total assist for scooting up towards HOB. Bed pad used for assist with completing roll. Pt able to reach for railings to assist. Pt had just finished hydrotherapy and did not attempt further mobility at this time.    Transfers                    Ambulation/Gait                Stairs            Wheelchair Mobility    Modified Rankin (Stroke Patients Only)       Balance  Not able to assess        Pertinent Vitals/Pain Pain Assessment: Faces Faces Pain Scale: Hurts little more Pain Location: Buttock wound Pain Descriptors / Indicators: Grimacing;Guarding Pain Intervention(s): Limited activity within patient's tolerance;Monitored during session;Repositioned    Home Living Family/patient expects to be discharged to:: Skilled nursing facility Living Arrangements: Children      Extremity/Trunk Assessment   Upper Extremity Assessment Upper Extremity Assessment: Defer to OT evaluation    Lower Extremity Assessment Lower Extremity Assessment: Generalized weakness       Communication   Communication: No difficulties   Assessment/Plan    PT Assessment Patient needs continued PT services  PT Problem List Decreased strength;Decreased activity tolerance;Decreased balance;Decreased mobility;Decreased knowledge of use of DME;Decreased safety awareness;Decreased knowledge of precautions;Pain;Decreased skin integrity       PT Treatment Interventions DME instruction;Gait training;Functional mobility training;Therapeutic activities;Therapeutic exercise;Stair training;Neuromuscular re-education;Patient/family education    PT Goals (Current goals can be found in the Care Plan section)  Acute Rehab PT Goals Patient Stated Goal: None stated during session PT Goal Formulation: With patient Time For Goal Achievement: 07/16/18 Potential to Achieve Goals: Good    Frequency Min 3X/week   Barriers to discharge Decreased caregiver support  Pt requiring +2 assist for basic bed mobility at this time.    Co-evaluation               AM-PAC PT "6 Clicks" Daily Activity  Outcome Measure Difficulty turning over in bed (including adjusting bedclothes, sheets and  blankets)?: Unable Difficulty moving from lying on back to sitting on the side of the bed? : Unable Difficulty sitting down on and standing up from a chair with arms (e.g., wheelchair, bedside commode, etc,.)?: Unable Help needed moving to and from a bed to chair (including a wheelchair)?: Total Help needed walking in hospital room?: Total Help needed climbing 3-5 steps with a railing? : Total 6 Click Score: 6    End of Session   Activity Tolerance: Patient limited by pain;Patient limited by fatigue Patient left: in bed;with call bell/phone within reach;with nursing/sitter in room Nurse Communication: Mobility status PT Visit Diagnosis: Unsteadiness on feet (R26.81);Pain;Muscle weakness (generalized) (M62.81) Pain - Right/Left: Left Pain - part of body: (Buttock)    Time: [id number] PT Time Calculation (min) (ACUTE ONLY): 13 min   Charges:   PT Evaluation $PT Eval High Complexity: 1 High          Conni SlipperLaura Kytzia Gienger, PT, DPT Acute Rehabilitation Services Pager: [phone]   Marylynn PearsonLaura D Yaira Bernardi 07/02/2018, 12:17 PM

## 2018-07-02 NOTE — Progress Notes (Signed)
Physical Therapy Wound Treatment Patient Details  Name: [name] MRN: [id number] Date of Birth: November 14, 1928  Today's Date: 07/02/2018 Time: 0815-0855 Time Calculation (min): 40 min  Subjective  Subjective: "I got some pain medication earlier so I'm good now." Patient and Family Stated Goals: Heal wound per pt and family member present Date of Onset: (Unsure) Prior Treatments: Surgical debridement 06/27/18 and 06/29/18  Pain Score:  Pt premedicated. Mild pain reported with treatment.   Wound Assessment  Pressure Injury 06/27/18 Unstageable - Full thickness tissue loss in which the base of the ulcer is covered by slough (yellow, tan, gray, green or brown) and/or eschar (tan, brown or black) in the wound bed. Three areas of black eschar surrounded by blan (Active)  Wound Image   07/02/2018 12:00 PM  Dressing Type Moist to dry;ABD;Gauze (Comment);Barrier Film (skin prep) 07/02/2018 12:00 PM  Dressing Changed;Clean;Dry;Intact 07/02/2018 12:00 PM  Dressing Change Frequency Daily 07/02/2018 12:00 PM  State of Healing Non-healing 07/02/2018 12:00 PM  Site / Wound Assessment Pink;Red;Yellow;Black 07/02/2018 12:00 PM  % Wound base Red or Granulating 45% 07/02/2018 12:00 PM  % Wound base Yellow/Fibrinous Exudate 50% 07/02/2018 12:00 PM  % Wound base Black/Eschar 5% 07/02/2018 12:00 PM  % Wound base Other/Granulation Tissue (Comment) 0% 07/02/2018 12:00 PM  Peri-wound Assessment Intact;Erythema (blanchable) 07/02/2018 12:00 PM  Wound Length (cm) 16.2 cm 07/02/2018 12:00 PM  Wound Width (cm) 15 cm 07/02/2018 12:00 PM  Wound Depth (cm) 5 cm 07/02/2018 12:00 PM  Wound Surface Area (cm^2) 243 cm^2 07/02/2018 12:00 PM  Wound Volume (cm^3) 1215 cm^3 07/02/2018 12:00 PM  Tunneling (cm) 5.2 in center of wound 07/02/2018 12:00 PM  Undermining (cm) 2.1 cm 6-9:00, 3.9 cm 9-10:00, 3 cm 10-12:00 07/02/2018 12:00 PM  Margins Unattached edges (unapproximated) 07/02/2018 12:00 PM  Drainage Amount Moderate 07/02/2018 12:00 PM  Drainage Description  Serosanguineous;Odor 07/02/2018 12:00 PM  Treatment Debridement (Selective);Hydrotherapy (Pulse lavage);Packing (Saline gauze) 07/02/2018 12:00 PM   Santyl applied to wound bed prior to applying dressing.     Hydrotherapy Pulsed lavage therapy - wound location: Buttock Pulsed Lavage with Suction (psi): 12 psi Pulsed Lavage with Suction - Normal Saline Used: 1000 mL Pulsed Lavage Tip: Tip with splash shield Selective Debridement Selective Debridement - Location: Buttock Selective Debridement - Tools Used: Forceps;Scissors Selective Debridement - Tissue Removed: Yellow and black necrotic tissue   Wound Assessment and Plan  Wound Therapy - Assess/Plan/Recommendations Wound Therapy - Clinical Statement: Progressing with debridement. Will plan to see pt early tomorrow morning for hydrotherapy before OR.This patient will benefit from continued hydrotherapy for selective removal of unviable tissue, to decrease bioburden and promote wound bed healing.  Wound Therapy - Functional Problem List: Decreased tolerance for OOB mobility/position changes Factors Delaying/Impairing Wound Healing: Immobility;Multiple medical problems;Altered sensation Hydrotherapy Plan: Debridement;Dressing change;Patient/family education;Pulsatile lavage with suction Wound Therapy - Frequency: 6X / week Wound Therapy - Follow Up Recommendations: Skilled nursing facility Wound Plan: See above  Wound Therapy Goals- Improve the function of patient's integumentary system by progressing the wound(s) through the phases of wound healing (inflammation - proliferation - remodeling) by: Decrease Necrotic Tissue to: 20% Decrease Necrotic Tissue - Progress: Progressing toward goal Increase Granulation Tissue to: 80% Increase Granulation Tissue - Progress: Progressing toward goal Goals/treatment plan/discharge plan were made with and agreed upon by patient/family: Yes Time For Goal Achievement: 7 days Wound Therapy - Potential for  Goals: Good  Goals will be updated until maximal potential achieved or discharge criteria met.  Discharge criteria: when  goals achieved, discharge from hospital, MD decision/surgical intervention, no progress towards goals, refusal/missing three consecutive treatments without notification or medical reason.  GP     Thelma Comp 07/02/2018, 12:32 PM   Rolinda Roan, PT, DPT Acute Rehabilitation Services Pager: [phone]

## 2018-07-02 NOTE — Clinical Social Work Note (Signed)
Clinical Social Work Assessment  Patient Details  Name: [name] MRN: [id number] Date of Birth: [birth year]  Date of referral:  07/02/18               Reason for consult:  Discharge Planning, Facility Placement                Permission sought to share information with:  Family Supports Permission granted to share information::  Yes, Verbal Permission Granted  Name::     [name]::  snf  Relationship::  daughter  Contact Information:  [phone]  Housing/Transportation Living arrangements for the past 2 months:  Single Family Home Source of Information:  Patient Patient Interpreter Needed:  None Criminal Activity/Legal Involvement Pertinent to Current Situation/Hospitalization:  No - Comment as needed Significant Relationships:  Adult Children, Other Family Members Lives with:  Other (Comment)(grandson lives in the home) Do you feel safe going back to the place where you live?  Yes Need for family participation in patient care:  Yes (Comment)  Care giving concerns:  CSW met patient at bedside to offer support and discuss discharge needs. Patient lives at home with grandson and has other family members near by for support. Patients daughter was present during assessment and is very involved and supportive of patient    Facilities manager / plan:  CSW and family went over patient discharge plan. Family stated they would prefer patient to discharge to daughters home and receive home health. [name] (pateints daughter) stated she will be able to take care of patient in her home and have family members to assist her. [name] stated that several of her neighbors and family members are nurse and would be able to offer assistance. [name] stated that she does want MD to approve of decision before taking patients home. Family stated that they will keep an open mind about discharging to a facility but there preference is to go home with daughter [name]  Employment status:   Retired Forensic scientist:  Programmer, applications PT Recommendations:  [place] / Referral to community resources:  [place]  Patient/Family's Response to care:  Family extremely supportive of patient  Patient/Family's Understanding of and Emotional Response to Diagnosis, Current Treatment, and Prognosis:  Families preference is for patient to go home with home health but they stated they are open to going to rehab if its truly needed  Emotional Assessment Appearance:  Appears stated age Attitude/Demeanor/Rapport:  Engaged Affect (typically observed):  Accepting Orientation:  Oriented to Self, Oriented to Place, Oriented to  Time, Oriented to Situation Alcohol / Substance use:  Not Applicable Psych involvement (Current and /or in the community):  No (Comment)  Discharge Needs  Concerns to be addressed:  Care Coordination Readmission within the last 30 days:  No Current discharge risk:  Dependent with Mobility Barriers to Discharge:  Continued Medical Work up   [employer], LCSW 07/02/2018, 6:06 PM

## 2018-07-02 NOTE — Progress Notes (Signed)
[facility] Progress Note  3 Days Post-Op  Subjective: CC: wound Patient denies pain this AM. Incontinent of stool and urine, has foley. Plan for diverting colostomy tomorrow, hopefully can coordinate with urology to get SP tube at the same time. Already had hydrotherapy this AM.   Objective: Vital signs in last 24 hours: Temp:  [97.8 F (36.6 C)-98.7 F (37.1 C)] 98.1 F (36.7 C) (08/05 0741) Pulse Rate:  [44-63] 44 (08/05 0741) Resp:  [15-30] 17 (08/05 0741) BP: (105-122)/(61-81) 106/61 (08/05 0741) SpO2:  [96 %-99 %] 96 % (08/05 0741) Weight:  [111.9 kg (246 lb 11.1 oz)] 111.9 kg (246 lb 11.1 oz) (08/05 0501) Last BM Date: 07/01/18  Intake/Output from previous day: 08/04 0701 - 08/05 0700 In: 2005.2 [P.O.:140; I.V.:1544.6; IV Piggyback:320.7] Out: 825 [Urine:825] Intake/Output this shift: No intake/output data recorded.  PE: Gen:  Alert, NAD, pleasant Card:  Regular rate and rhythm Pulm:  Normal effort, clear to auscultation bilaterally Abd: Soft, non-tender, non-distended, bowel sounds present GU: picture from hydrotherapy this AM     Lab Results:  Recent Labs    07/01/18 0311 07/02/18 0356  WBC 20.4* 21.4*  HGB 8.3* 8.2*  HCT 26.1* 25.2*  PLT 101* 109*   BMET Recent Labs    07/01/18 0311 07/02/18 0356  NA 143 143  K 4.8 3.9  CL 117* 117*  CO2 19* 16*  GLUCOSE 108* 103*  BUN 72* 59*  CREATININE 1.91* 1.76*  CALCIUM 8.0* 8.1*   PT/INR No results for input(s): LABPROT, INR in the last 72 hours. CMP     Component Value Date/Time   NA 143 07/02/2018 0356   K 3.9 07/02/2018 0356   CL 117 (H) 07/02/2018 0356   CO2 16 (L) 07/02/2018 0356   GLUCOSE 103 (H) 07/02/2018 0356   BUN 59 (H) 07/02/2018 0356   CREATININE 1.76 (H) 07/02/2018 0356   CALCIUM 8.1 (L) 07/02/2018 0356   PROT 5.3 (L) 06/27/2018 0416   ALBUMIN 2.2 (L) 06/27/2018 0416   AST 19 06/27/2018 0416   ALT 12 06/27/2018 0416   ALKPHOS 101 06/27/2018 0416   BILITOT 1.3  (H) 06/27/2018 0416   GFRNONAA 25 (L) 07/02/2018 0356   GFRAA 29 (L) 07/02/2018 0356   Lipase  No results found for: LIPASE     Studies/Results: No results found.  Anti-infectives: Anti-infectives (From admission, onward)   Start     Dose/Rate Route Frequency Ordered Stop   06/30/18 1300  piperacillin-tazobactam (ZOSYN) IVPB 3.375 g     3.375 g 12.5 mL/hr over 240 Minutes Intravenous Every 8 hours 06/30/18 1242     06/29/18 0900  vancomycin (VANCOCIN) 1,500 mg in sodium chloride 0.9 % 500 mL IVPB     1,500 mg 250 mL/hr over 120 Minutes Intravenous Every 48 hours 06/29/18 0805     06/28/18 1055  vancomycin variable dose per unstable renal function (pharmacist dosing)  Status:  Discontinued      Does not apply See admin instructions 06/28/18 1055 06/29/18 0805   06/27/18 2345  piperacillin-tazobactam (ZOSYN) IVPB 2.25 g  Status:  Discontinued     2.25 g 100 mL/hr over 30 Minutes Intravenous Every 8 hours 06/27/18 2336 06/30/18 1242   06/27/18 2315  vancomycin (VANCOCIN) IVPB 750 mg/150 ml premix  Status:  Discontinued    Note to Pharmacy:  Pharmacy to dose and monitor levels as appropriate for necrotizing fascitis   750 mg 150 mL/hr over 60 Minutes Intravenous Every 12 hours 06/27/18 2303  06/27/18 2329   06/27/18 2315  clindamycin (CLEOCIN) IVPB 600 mg     600 mg 100 mL/hr over 30 Minutes Intravenous Every 8 hours 06/27/18 2309     06/27/18 1215  vancomycin (VANCOCIN) 1,500 mg in sodium chloride 0.9 % 500 mL IVPB     1,500 mg 250 mL/hr over 120 Minutes Intravenous  Once 06/27/18 1142 06/27/18 1500   06/27/18 1100  metroNIDAZOLE (FLAGYL) IVPB 500 mg  Status:  Discontinued     500 mg 100 mL/hr over 60 Minutes Intravenous Every 8 hours 06/27/18 1052 06/27/18 2303   06/27/18 0200  cefTRIAXone (ROCEPHIN) 1 g in sodium chloride 0.9 % 100 mL IVPB  Status:  Discontinued     1 g 200 mL/hr over 30 Minutes Intravenous Every 24 hours 06/27/18 0138 06/27/18 2258        Assessment/Plan AKI on CKD stage III - Cr1.76,improving Acute onChronic normocytic anemia - H/H8.2/25.2, per primary service Enteritis - nonspecific finding on CT Hypothyroidism - home meds HTN - holding home meds for hypoTN Obesity - BMI 38.52 OSA  Sepsis- likely 2/2 below, improving  L buttockFournier's Gangrene - S/P excisional debridement of L gluteal necrotizing soft tissue infection, Dr. Cliffton AstersWhite, 07/31 - S/P Debridement of large sacral perineal and sacral wound, Dr. Luisa Hartornett, 08/02 - WBC21.4, trending back up  - cxs show moderate bacteroides beta lactamase +, rare klebsiella pneumoniae - narrowing of abx per primary team  - patient is incontinent - plan for diverting colostomy tomorrow  - hydrotherapy  - BID dressing changes with prn changes when soiled.  FEN:CLD, IVF VTE: SCDs ID: [email or web address]  Zosyn  cleocin7/31>> Foley: present Follow up: TBD  DISPO: hydrotherapy. Will plan for diverting colostomy tomorrow and try to coordinate with urology for SP catheter placement as well.     LOS: 6 days    Wells GuilesKelly Rayburn , [facility] 07/02/2018, 8:48 AM Pager: [phone] Consults: [phone] Mon-Fri 7:00 am-4:30 pm Sat-Sun 7:00 am-11:30 am

## 2018-07-02 NOTE — Progress Notes (Signed)
PROGRESS NOTE    [name]  VHQ:[id number] DOB: 10/24/1928 DOA: 06/26/2018 PCP: Kirby FunkGriffin, John, MD      Brief Narrative:  Mrs. [name] is a 84 y.o. F with HTN, hypothyroidism who presents with 1 week buttock pain, followed by malaise, diarrhea, and weakness.    Went to UC, where she was told she was "dehydrated".  Came to the ER, where she was found to have BP 80/40, leukocytosis, Cr 5.5, and lactic acidosis.  Given 30 cc/kg fluids, lactate cleared, BP improved somewhat, she was mentating well with MAP>60.  Started on ceftriaxone for presumed UTI.  CCM were consulted, recommended continued medical therapy with fluids, antibiotics.  CT abdomen showed ?enteritis, also buttock cellulitis.  Buttock examination showed cellulitis with necrosis.  Admitted for sepis/Fournier's.  Started on empiric antibiotics.  Surgery consulted for debridement.     Assessment & Plan:  Sepsis from Fournier's gangrene S/P excisional debridement of L gluteal necrotizing soft tissue infection, Dr. Cliffton AstersWhite, 07/31 S/P Debridement of large sacral perineal and sacral wound, Dr. Luisa Hartornett, 08/02 Diverting colostomy and SP catheter have been recommended by General Surgery.  Urology saw yesterday re: SP.   Wound culture polymicrobial.  BP remains good.  WBC still rising, no new fever. -Continue Vanc/Zosyn/Clinda  -Currently day 3 of 7 since final debridement -Hydrotherapy per Gen Surg -Consult to general surgery, appreciate expert cares -SP catheter per Urology and General Surgery   Acute renal failure on chronic kidney disease stage III Baseline creatinine 1.4-1.5.  Admitted with creatinine 5.5.  Creatinine continues to improve, urine output good. -Avoid nephrotoxins -Hold home Lasix -Continue IV fluids gently until taking orals better -Trend creatinine -Strict I/Os  Chronic normocytic anemia Doubted acute blood loss anemia There is talk of a diverting colostomy, per patient surgery notes also suggest SP tube  or Foley chronically.    Low suspicion for GI bleed.  Hemoglobin at baseline 10-11.   FOBT negative.  hemoglobin remained stable. -Daily CBC -Continue PPI for stress ulcer PPx, while patient is in ICU, not for clinical supicion of GI bleed -Transfusion threshold 7 g/dL -Gastroenterology were consulted, they have signed off  Hypothyroidism -Continue levothyroxine  Hypertension Hypotensive at admission, BP remains normotensive off medicines. -Hold Lasix, ARB  Other medications -Continue allopurinol, Zoloft   Enteritis This is a nonspecific finding on CT.  Monitor stools.         DVT prophylaxis: SCDs Code Status: DO NOT RESUSCITATE Family Communication: None present  MDM and disposition Plan: The below labs and imaging reports were reviewed and summarized above.  Medication management as above.  The patient was admitted with sepsis from Fournier's gangrene.  General surgery were consulted and debrided this twice.  She was briefly on pressors for septic shock after the first debridement, but has had stable blood pressure on IV fluids since then.   She has resolving renal failure, also a new anemia which appears to be stable.  Nephrology and gastroenterology were consulted, but we have a low suspicion for GI bleed, and her renal function is improving, and so both have signed off.  Per surgery, will require further hydrotherapy, diverting colostomy and SP catheter.  Once surgical treatment is finished, we will evaluate with PT, likely will need placement.      Consultants:   GI  Nephrology  General Surgery  Procedures:   None  Antimicrobials:   Ceftriaxone 7/31 >> 7/31  Vancomycin 7/31 >>  Flagyl 7/31 >> 7/31  Clindamycin 7/31 >>  Zosyn 7/31 >>  Cultures:   7/30 Blood cultures x2: No growth  7/30 Urine culture: Enterobacter, Zosyn susceptible  7/31 intraoperative culture: Polymicrobial      Subjective: No new fever, vomiting, confusion.  Her  buttocks hurt right now, as she is undergoing hydrotherapy, but otherwise it is well controlled.  Appetite poor, UOP good.  Still incontinent.   Objective: Vitals:   07/02/18 0000 07/02/18 0404 07/02/18 0501 07/02/18 0741  BP:  105/81  106/61  Pulse: (!) 58 (!) 58  (!) 44  Resp: (!) 21 15  17   Temp:  97.9 F (36.6 C)  98.1 F (36.7 C)  TempSrc:  Oral  Oral  SpO2: 97% 99%  96%  Weight:   111.9 kg (246 lb 11.1 oz)   Height:        Intake/Output Summary (Last 24 hours) at 07/02/2018 0833 Last data filed at 07/02/2018 0700 Gross per 24 hour  Intake 2005.22 ml  Output 825 ml  Net 1180.22 ml   Filed Weights   06/30/18 0343 07/01/18 0516 07/02/18 0501  Weight: 107 kg (235 lb 14.4 oz) 111.8 kg (246 lb 7.6 oz) 111.9 kg (246 lb 11.1 oz)    Examination: General appearance: Obese adult female, lying in bed, interactive, awake.  Tired, undergoing hydrotherapy. HEENT: Anicteric, conjunctive pink, lids and lashes normal.  No nasal deformity, discharge, or epistaxis.  Lips moist, oropharynx moist, no oral lesions, hearing normal, dentures in place. Skin: Dry.  She is undergoing hydrotherapy: She has a large roughly 20 cm buttock wound down to muscle.  This appears pink.  I do not appreciate exposed bone, but wound not probed by me.   Cardiac: Regular rate and rhythm, no murmurs, no lower extremity edema Respiratory: Normal respiratory rate and rhythm, lungs clear without rales or wheezes. Abdomen: Abdomen soft without tenderness to palpation.  No guarding. MSK: No deformities or effusions. Neuro: Alert, extraocular movements intact, upper extremity strength symmetric 4 5.  Speech fluent.Marland Kitchen Psych: Sensorium intact and responding to questions, attention normal, affect normal, judgment and insight appear normal.    Data Reviewed: I have personally reviewed following labs and imaging studies:  CBC: Recent Labs  Lab 06/28/18 1518 06/29/18 0432 06/30/18 0422 07/01/18 0311 07/02/18 0356  WBC  8.7 11.8* 15.4* 20.4* 21.4*  HGB 8.0* 7.3* 8.0* 8.3* 8.2*  HCT 25.0* 22.4* 24.3* 26.1* 25.2*  MCV 94.0 92.6 92.7 93.9 94.7  PLT 104* 87* 102* 101* 109*   Basic Metabolic Panel: Recent Labs  Lab 06/27/18 0003  06/27/18 2332 06/29/18 0432 06/30/18 0422 07/01/18 0311 07/02/18 0356  NA  --    < > 143 145 144 143 143  K  --    < > 4.3 3.7 3.9 4.8 3.9  CL  --    < > 112* 114* 117* 117* 117*  CO2  --    < > 16* 14* 15* 19* 16*  GLUCOSE  --    < > 103* 131* 150* 108* 103*  BUN  --    < > 94* 81* 73* 72* 59*  CREATININE  --    < > 3.82* 2.62* 2.07* 1.91* 1.76*  CALCIUM  --    < > 7.5* 8.0* 8.3* 8.0* 8.1*  MG 2.1  --  2.0 2.1  --   --   --   PHOS  --   --   --  5.1*  --   --   --    < > = values in this interval not  displayed.   GFR: Estimated Creatinine Clearance: 27.1 mL/min (A) (by C-G formula based on SCr of 1.76 mg/dL (H)). Liver Function Tests: Recent Labs  Lab 06/26/18 2106 06/27/18 0416  AST 22 19  ALT 13 12  ALKPHOS 131* 101  BILITOT 1.6* 1.3*  PROT 6.1* 5.3*  ALBUMIN 2.7* 2.2*   No results for input(s): LIPASE, AMYLASE in the last 168 hours. No results for input(s): AMMONIA in the last 168 hours. Coagulation Profile: Recent Labs  Lab 06/26/18 2106  INR 1.24   Cardiac Enzymes: Recent Labs  Lab 06/27/18 0003 06/27/18 0416 06/27/18 1039  TROPONINI <0.03 <0.03 0.03*   BNP (last 3 results) No results for input(s): PROBNP in the last 8760 hours. HbA1C: No results for input(s): HGBA1C in the last 72 hours. CBG: Recent Labs  Lab 06/27/18 2228 06/29/18 0936  GLUCAP 98 119*   Lipid Profile: No results for input(s): CHOL, HDL, LDLCALC, TRIG, CHOLHDL, LDLDIRECT in the last 72 hours. Thyroid Function Tests: No results for input(s): TSH, T4TOTAL, FREET4, T3FREE, THYROIDAB in the last 72 hours. Anemia Panel: No results for input(s): VITAMINB12, FOLATE, FERRITIN, TIBC, IRON, RETICCTPCT in the last 72 hours. Urine analysis:    Component Value Date/Time    COLORURINE YELLOW 06/27/2018 0043   APPEARANCEUR HAZY (A) 06/27/2018 0043   LABSPEC 1.012 06/27/2018 0043   PHURINE 5.0 06/27/2018 0043   GLUCOSEU NEGATIVE 06/27/2018 0043   HGBUR NEGATIVE 06/27/2018 0043   BILIRUBINUR NEGATIVE 06/27/2018 0043   KETONESUR NEGATIVE 06/27/2018 0043   PROTEINUR NEGATIVE 06/27/2018 0043   NITRITE NEGATIVE 06/27/2018 0043   LEUKOCYTESUR SMALL (A) 06/27/2018 0043   Sepsis Labs: @LABRCNTIP (procalcitonin:4,lacticacidven:4)  ) Recent Results (from the past 240 hour(s))  Urine Culture     Status: Abnormal   Collection Time: 06/27/18 12:43 AM  Result Value Ref Range Status   Specimen Description URINE, RANDOM  Final   Special Requests   Final    NONE Performed at [facility], [address] [place], [place] [address]    Culture >=100,000 COLONIES/mL ENTEROBACTER CLOACAE (A)  Final   Report Status 06/29/2018 FINAL  Final   Organism ID, Bacteria ENTEROBACTER CLOACAE (A)  Final      Susceptibility   Enterobacter cloacae - MIC*    CEFAZOLIN >=64 RESISTANT Resistant     CEFTRIAXONE 2 SENSITIVE Sensitive     CIPROFLOXACIN <=0.25 SENSITIVE Sensitive     GENTAMICIN <=1 SENSITIVE Sensitive     IMIPENEM <=0.25 SENSITIVE Sensitive     NITROFURANTOIN 64 INTERMEDIATE Intermediate     TRIMETH/SULFA <=20 SENSITIVE Sensitive     PIP/TAZO 8 SENSITIVE Sensitive     * >=100,000 COLONIES/mL ENTEROBACTER CLOACAE  MRSA PCR Screening     Status: None   Collection Time: 06/27/18  3:16 AM  Result Value Ref Range Status   MRSA by PCR NEGATIVE NEGATIVE Final    Comment:        The GeneXpert MRSA Assay (FDA approved for NASAL specimens only), is one component of a comprehensive MRSA colonization surveillance program. It is not intended to diagnose MRSA infection nor to guide or monitor treatment for MRSA infections. Performed at [facility], [address] [place], [place] [address]   Culture, blood (Routine X 2) w Reflex to ID Panel     Status: None  (Preliminary result)   Collection Time: 06/27/18  4:05 AM  Result Value Ref Range Status   Specimen Description BLOOD LEFT ANTECUBITAL  Final   Special Requests  IN PEDIATRIC BOTTLE Blood Culture adequate volume  Final   Culture   Final    NO GROWTH 4 DAYS Performed at [facility], [address], [place], [place] [address]    Report Status PENDING  Incomplete  Culture, blood (Routine X 2) w Reflex to ID Panel     Status: None (Preliminary result)   Collection Time: 06/27/18  4:20 AM  Result Value Ref Range Status   Specimen Description BLOOD LEFT HAND  Final   Special Requests IN PEDIATRIC BOTTLE Blood Culture adequate volume  Final   Culture   Final    NO GROWTH 4 DAYS Performed at [facility], [address] [place], [place] [address]    Report Status PENDING  Incomplete  Fungus Culture With Stain     Status: None (Preliminary result)   Collection Time: 06/27/18  6:01 PM  Result Value Ref Range Status   Fungus Stain Final report  Final    Comment: (NOTE) Performed At: [facility] [address] [place], [place] [id number] Jolene Schimke MD [id number]    Fungus (Mycology) Culture PENDING  Incomplete   Fungal Source ABSCESS  Final    Comment: LEFT BUTTOCKS ON VANC Performed at [facility], [address] [place], [place] [address]   Aerobic/Anaerobic Culture (surgical/deep wound)     Status: None   Collection Time: 06/27/18  6:01 PM  Result Value Ref Range Status   Specimen Description ABSCESS  Final   Special Requests NONE  Final   Gram Stain   Final    NO WBC SEEN MODERATE GRAM POSITIVE COCCI IN PAIRS MODERATE GRAM NEGATIVE COCCOBACILLI Performed at [facility], [address] [place], [place] [address]    Culture   Final    MODERATE BACTEROIDES SPECIES BETA LACTAMASE POSITIVE RARE KLEBSIELLA PNEUMONIAE    Report Status 07/01/2018 FINAL  Final   Organism ID, Bacteria KLEBSIELLA PNEUMONIAE  Final      Susceptibility    Klebsiella pneumoniae - MIC*    AMPICILLIN RESISTANT Resistant     CEFAZOLIN <=4 SENSITIVE Sensitive     CEFEPIME <=1 SENSITIVE Sensitive     CEFTAZIDIME <=1 SENSITIVE Sensitive     CEFTRIAXONE <=1 SENSITIVE Sensitive     CIPROFLOXACIN <=0.25 SENSITIVE Sensitive     GENTAMICIN <=1 SENSITIVE Sensitive     IMIPENEM <=0.25 SENSITIVE Sensitive     TRIMETH/SULFA <=20 SENSITIVE Sensitive     AMPICILLIN/SULBACTAM <=2 SENSITIVE Sensitive     PIP/TAZO <=4 SENSITIVE Sensitive     Extended ESBL NEGATIVE Sensitive     * RARE KLEBSIELLA PNEUMONIAE  Fungus Culture Result     Status: None   Collection Time: 06/27/18  6:01 PM  Result Value Ref Range Status   Result 1 Comment  Final    Comment: (NOTE) KOH/Calcofluor preparation:  no fungus observed. Performed At: [facility] [address] [place], [place] [id number] Jolene Schimke MD [id number]   Surgical pcr screen     Status: None   Collection Time: 06/29/18  8:56 AM  Result Value Ref Range Status   MRSA, PCR NEGATIVE NEGATIVE Final   Staphylococcus aureus NEGATIVE NEGATIVE Final    Comment: (NOTE) The Xpert SA Assay (FDA approved for NASAL specimens in patients 76 years of age and older), is one component of a comprehensive surveillance program. It is not intended to diagnose infection nor to guide or monitor treatment. Performed at [facility], [address], [place], [place] [address]          Radiology Studies: No results found.      Scheduled Meds: . allopurinol  100 mg Oral Daily  . collagenase   Topical BID  . levothyroxine  137 mcg Oral QAC breakfast  . pantoprazole  40 mg Oral Daily  . sertraline  50 mg Oral Daily   Continuous Infusions: . sodium chloride 50 mL/hr at 07/02/18 0700  . clindamycin (CLEOCIN) IV Stopped (07/02/18 0548)  . piperacillin-tazobactam (ZOSYN)  IV 12.5 mL/hr at 07/02/18 0700  . vancomycin 1,500 mg (07/01/18 1018)     LOS: 6 days    Time spent: 25  minutes    Alberteen Sam, MD [facility] 07/02/2018, 8:33 AM     Pager [phone] --- please page though AMION:  [email or web address] Password [email or web address] If 7PM-7AM, please contact night-coverage

## 2018-07-03 ENCOUNTER — Encounter (HOSPITAL_COMMUNITY): Payer: Self-pay

## 2018-07-03 ENCOUNTER — Encounter (HOSPITAL_COMMUNITY)
Admission: EM | Disposition: A | Discharge status: Rehabilitation Facility | Payer: Self-pay | Source: Home / Self Care | Attending: Family Medicine

## 2018-07-03 ENCOUNTER — Inpatient Hospital Stay (HOSPITAL_COMMUNITY): Payer: Medicare Other | Admitting: Anesthesiology

## 2018-07-03 HISTORY — PX: INSERTION OF SUPRAPUBIC CATHETER: SHX5870

## 2018-07-03 HISTORY — PX: LAPAROSCOPIC DIVERTED COLOSTOMY: SHX5892

## 2018-07-03 LAB — BASIC METABOLIC PANEL
Anion gap: 12 (ref 5–15)
BUN: 47 mg/dL — ABNORMAL HIGH (ref 8–23)
CALCIUM: 8.1 mg/dL — AB (ref 8.9–10.3)
CO2: 16 mmol/L — ABNORMAL LOW (ref 22–32)
Chloride: 115 mmol/L — ABNORMAL HIGH (ref 98–111)
Creatinine, Ser: 1.65 mg/dL — ABNORMAL HIGH (ref 0.44–1.00)
GFR calc Af Amer: 31 mL/min — ABNORMAL LOW (ref >60)
GFR, EST NON AFRICAN AMERICAN: 27 mL/min — AB (ref >60)
GLUCOSE: 111 mg/dL — AB (ref 70–99)
Potassium: 3.9 mmol/L (ref 3.5–5.1)
SODIUM: 143 mmol/L (ref 135–145)

## 2018-07-03 LAB — CBC
HEMATOCRIT: 26 % — AB (ref 36.0–46.0)
HEMATOCRIT: 30.1 % — AB (ref 36.0–46.0)
HEMOGLOBIN: 9.4 g/dL — AB (ref 12.0–15.0)
Hemoglobin: 8.4 g/dL — ABNORMAL LOW (ref 12.0–15.0)
MCH: 30.5 pg (ref 26.0–34.0)
MCH: 30.4 pg (ref 26.0–34.0)
MCHC: 32.3 g/dL (ref 30.0–36.0)
MCHC: 31.2 g/dL (ref 30.0–36.0)
MCV: 94.5 fL (ref 78.0–100.0)
MCV: 97.4 fL (ref 78.0–100.0)
Platelets: 111 10*3/uL — ABNORMAL LOW (ref 150–400)
Platelets: 125 10*3/uL — ABNORMAL LOW (ref 150–400)
RBC: 2.75 MIL/uL — ABNORMAL LOW (ref 3.87–5.11)
RBC: 3.09 MIL/uL — AB (ref 3.87–5.11)
RDW: 15.4 % (ref 11.5–15.5)
RDW: 15.9 % — ABNORMAL HIGH (ref 11.5–15.5)
WBC: 20.4 10*3/uL — AB (ref 4.0–10.5)
WBC: 26 10*3/uL — ABNORMAL HIGH (ref 4.0–10.5)

## 2018-07-03 SURGERY — CREATION, COLOSTOMY, DIVERTING, LAPAROSCOPIC
Anesthesia: General | Site: Urethra

## 2018-07-03 MED ORDER — PHENYLEPHRINE HCL 10 MG/ML IJ SOLN
INTRAMUSCULAR | Status: DC | PRN
  Administered 2018-07-03: 25 ug/min via INTRAVENOUS

## 2018-07-03 MED ORDER — FENTANYL CITRATE (PF) 250 MCG/5ML IJ SOLN
INTRAMUSCULAR | Status: DC | PRN
  Administered 2018-07-03: 50 ug via INTRAVENOUS
  Administered 2018-07-03: 100 ug via INTRAVENOUS
  Administered 2018-07-03: 50 ug via INTRAVENOUS

## 2018-07-03 MED ORDER — ONDANSETRON HCL 4 MG/2ML IJ SOLN
INTRAMUSCULAR | Status: AC
  Filled 2018-07-03: qty 2

## 2018-07-03 MED ORDER — FENTANYL CITRATE (PF) 250 MCG/5ML IJ SOLN
INTRAMUSCULAR | Status: AC
  Filled 2018-07-03: qty 5

## 2018-07-03 MED ORDER — DEXAMETHASONE SODIUM PHOSPHATE 10 MG/ML IJ SOLN
INTRAMUSCULAR | Status: AC
  Filled 2018-07-03: qty 1

## 2018-07-03 MED ORDER — LACTATED RINGERS IV SOLN
INTRAVENOUS | Status: DC
  Administered 2018-07-03: 10:00:00 via INTRAVENOUS

## 2018-07-03 MED ORDER — LIDOCAINE 2% (20 MG/ML) 5 ML SYRINGE
INTRAMUSCULAR | Status: AC
  Filled 2018-07-03: qty 5

## 2018-07-03 MED ORDER — ROCURONIUM BROMIDE 10 MG/ML (PF) SYRINGE
PREFILLED_SYRINGE | INTRAVENOUS | Status: AC
  Filled 2018-07-03: qty 10

## 2018-07-03 MED ORDER — BUPIVACAINE HCL 0.25 % IJ SOLN
INTRAMUSCULAR | Status: DC | PRN
  Administered 2018-07-03: 8 mL

## 2018-07-03 MED ORDER — EPHEDRINE SULFATE-NACL 50-0.9 MG/10ML-% IV SOSY
PREFILLED_SYRINGE | INTRAVENOUS | Status: DC | PRN
  Administered 2018-07-03 (×2): 10 mg via INTRAVENOUS

## 2018-07-03 MED ORDER — ENOXAPARIN SODIUM 40 MG/0.4ML ~~LOC~~ SOLN
40.0000 mg | SUBCUTANEOUS | Status: DC
  Administered 2018-07-04 – 2018-07-11 (×8): 40 mg via SUBCUTANEOUS
  Filled 2018-07-03 (×8): qty 0.4

## 2018-07-03 MED ORDER — ONDANSETRON HCL 4 MG/2ML IJ SOLN
4.0000 mg | Freq: Four times a day (QID) | INTRAMUSCULAR | Status: DC | PRN
  Administered 2018-07-04 – 2018-07-08 (×2): 4 mg via INTRAVENOUS
  Filled 2018-07-03 (×2): qty 2

## 2018-07-03 MED ORDER — HYDROMORPHONE HCL 1 MG/ML IJ SOLN: 0.2500 mg | INTRAMUSCULAR | Status: DC | PRN

## 2018-07-03 MED ORDER — DEXAMETHASONE SODIUM PHOSPHATE 10 MG/ML IJ SOLN
INTRAMUSCULAR | Status: DC | PRN
  Administered 2018-07-03: 10 mg via INTRAVENOUS

## 2018-07-03 MED ORDER — BUPIVACAINE HCL (PF) 0.25 % IJ SOLN
INTRAMUSCULAR | Status: AC
  Filled 2018-07-03: qty 30

## 2018-07-03 MED ORDER — 0.9 % SODIUM CHLORIDE (POUR BTL) OPTIME
TOPICAL | Status: DC | PRN
  Administered 2018-07-03 (×2): 1000 mL

## 2018-07-03 MED ORDER — MEPERIDINE HCL 25 MG/ML IJ SOLN: 6.2500 mg | INTRAMUSCULAR | Status: DC | PRN

## 2018-07-03 MED ORDER — SUGAMMADEX SODIUM 500 MG/5ML IV SOLN
INTRAVENOUS | Status: AC
  Filled 2018-07-03: qty 5

## 2018-07-03 MED ORDER — LACTATED RINGERS IV SOLN
INTRAVENOUS | Status: DC | PRN
  Administered 2018-07-03: 10:00:00 via INTRAVENOUS

## 2018-07-03 MED ORDER — LIDOCAINE 2% (20 MG/ML) 5 ML SYRINGE
INTRAMUSCULAR | Status: DC | PRN
  Administered 2018-07-03: 100 mg via INTRAVENOUS

## 2018-07-03 MED ORDER — SUGAMMADEX SODIUM 200 MG/2ML IV SOLN
INTRAVENOUS | Status: DC | PRN
  Administered 2018-07-03: 231.4 mg via INTRAVENOUS

## 2018-07-03 MED ORDER — PROPOFOL 10 MG/ML IV BOLUS
INTRAVENOUS | Status: DC | PRN
  Administered 2018-07-03: 100 mg via INTRAVENOUS

## 2018-07-03 MED ORDER — ONDANSETRON HCL 4 MG/2ML IJ SOLN
INTRAMUSCULAR | Status: DC | PRN
  Administered 2018-07-03: 4 mg via INTRAVENOUS

## 2018-07-03 MED ORDER — SODIUM CHLORIDE 0.9 % IV SOLN: INTRAVENOUS | Status: DC | PRN

## 2018-07-03 MED ORDER — SODIUM CHLORIDE 0.9 % IR SOLN
Status: DC | PRN
  Administered 2018-07-03: 3000 mL

## 2018-07-03 MED ORDER — PHENYLEPHRINE 40 MCG/ML (10ML) SYRINGE FOR IV PUSH (FOR BLOOD PRESSURE SUPPORT)
PREFILLED_SYRINGE | INTRAVENOUS | Status: DC | PRN
  Administered 2018-07-03 (×4): 80 ug via INTRAVENOUS

## 2018-07-03 MED ORDER — ROCURONIUM BROMIDE 10 MG/ML (PF) SYRINGE
PREFILLED_SYRINGE | INTRAVENOUS | Status: DC | PRN
  Administered 2018-07-03: 50 mg via INTRAVENOUS
  Administered 2018-07-03: 20 mg via INTRAVENOUS

## 2018-07-03 MED ORDER — MIDAZOLAM HCL 2 MG/2ML IJ SOLN
INTRAMUSCULAR | Status: AC
  Filled 2018-07-03: qty 2

## 2018-07-03 MED ORDER — ONDANSETRON HCL 4 MG/2ML IJ SOLN: 4.0000 mg | Freq: Once | INTRAMUSCULAR | Status: DC | PRN

## 2018-07-03 MED ORDER — PHENYLEPHRINE 40 MCG/ML (10ML) SYRINGE FOR IV PUSH (FOR BLOOD PRESSURE SUPPORT)
PREFILLED_SYRINGE | INTRAVENOUS | Status: AC
  Filled 2018-07-03: qty 10

## 2018-07-03 MED ORDER — PROPOFOL 10 MG/ML IV BOLUS
INTRAVENOUS | Status: AC
  Filled 2018-07-03: qty 20

## 2018-07-03 SURGICAL SUPPLY — 80 items
ADH SKN CLS LQ APL DERMABOND (GAUZE/BANDAGES/DRESSINGS) ×2
APPLIER CLIP ROT 10 11.4 M/L (STAPLE)
APR CLP MED LRG 11.4X10 (STAPLE)
BAG URIMETER 350ML BARDEX IC (UROLOGICAL SUPPLIES) ×1
BAG URIMETER BARDEX IC 350 (UROLOGICAL SUPPLIES) ×1 IMPLANT
BLADE CLIPPER SURG (BLADE) IMPLANT
CANISTER SUCT 3000ML PPV (MISCELLANEOUS) ×4 IMPLANT
CATH FOLEY 2W COUNCIL 5CC 16FR (CATHETERS) ×2 IMPLANT
CATH FOLEY INTRO SUPRA 16F (CATHETERS) ×2 IMPLANT
CATH ROBINSON RED A/P 20FR (CATHETERS) ×2 IMPLANT
CLIP APPLIE ROT 10 11.4 M/L (STAPLE) IMPLANT
COVER MAYO STAND STRL (DRAPES) IMPLANT
COVER SURGICAL LIGHT HANDLE (MISCELLANEOUS) ×4 IMPLANT
DECANTER SPIKE VIAL GLASS SM (MISCELLANEOUS) ×4 IMPLANT
DEFOGGER SCOPE WARMER CLEARIFY (MISCELLANEOUS) ×4 IMPLANT
DERMABOND ADHESIVE PROPEN (GAUZE/BANDAGES/DRESSINGS) ×2
DERMABOND ADVANCED .7 DNX6 (GAUZE/BANDAGES/DRESSINGS) IMPLANT
DISSECTOR BLUNT TIP ENDO 5MM (MISCELLANEOUS) IMPLANT
DRAPE UTILITY XL STRL (DRAPES) ×8 IMPLANT
DRAPE WARM FLUID 44X44 (DRAPE) ×4 IMPLANT
ELECT CAUTERY BLADE 6.4 (BLADE) ×4 IMPLANT
ELECT REM PT RETURN 9FT ADLT (ELECTROSURGICAL) ×4
ELECTRODE REM PT RTRN 9FT ADLT (ELECTROSURGICAL) ×2 IMPLANT
GAUZE SPONGE 4X4 12PLY STRL (GAUZE/BANDAGES/DRESSINGS) ×6 IMPLANT
GLOVE BIO SURGEON STRL SZ7.5 (GLOVE) ×8 IMPLANT
GLOVE BIOGEL PI IND STRL 8 (GLOVE) ×2 IMPLANT
GLOVE BIOGEL PI INDICATOR 8 (GLOVE) ×2
GLOVE INDICATOR 7.5 STRL GRN (GLOVE) ×2 IMPLANT
GOWN STRL REUS W/ TWL LRG LVL3 (GOWN DISPOSABLE) ×8 IMPLANT
GOWN STRL REUS W/ TWL XL LVL3 (GOWN DISPOSABLE) ×2 IMPLANT
GOWN STRL REUS W/TWL LRG LVL3 (GOWN DISPOSABLE) ×16
GOWN STRL REUS W/TWL XL LVL3 (GOWN DISPOSABLE) ×4
KIT BASIN OR (CUSTOM PROCEDURE TRAY) ×4 IMPLANT
KIT TURNOVER KIT B (KITS) ×4 IMPLANT
LEGGING LITHOTOMY PAIR STRL (DRAPES) ×4 IMPLANT
LIGASURE IMPACT 36 18CM CVD LR (INSTRUMENTS) IMPLANT
LIGASURE MARYLAND LAP STAND (ELECTROSURGICAL) IMPLANT
NDL SPNL 18GX3.5 QUINCKE PK (NEEDLE) IMPLANT
NEEDLE SPNL 18GX3.5 QUINCKE PK (NEEDLE) ×4 IMPLANT
NS IRRIG 1000ML POUR BTL (IV SOLUTION) ×4 IMPLANT
PACK SURGICAL SETUP 50X90 (CUSTOM PROCEDURE TRAY) ×2 IMPLANT
PAD ARMBOARD 7.5X6 YLW CONV (MISCELLANEOUS) ×8 IMPLANT
PENCIL BUTTON HOLSTER BLD 10FT (ELECTRODE) ×4 IMPLANT
POUCH LAPAROSCOPIC INSTRUMENT (MISCELLANEOUS) ×4 IMPLANT
SCISSORS LAP 5X35 DISP (ENDOMECHANICALS) IMPLANT
SET IRRIG TUBING LAPAROSCOPIC (IRRIGATION / IRRIGATOR) IMPLANT
SET IRRIG Y TYPE TUR BLADDER L (SET/KITS/TRAYS/PACK) ×2 IMPLANT
SPECIMEN JAR LARGE (MISCELLANEOUS) ×4 IMPLANT
STAPLER VISISTAT 35W (STAPLE) ×4 IMPLANT
SUT ETHILON 2 0 FS 18 (SUTURE) ×2 IMPLANT
SUT ETHILON 4 0 PS 2 18 (SUTURE) ×2 IMPLANT
SUT MNCRL AB 4-0 PS2 18 (SUTURE) ×2 IMPLANT
SUT PDS AB 1 CT  36 (SUTURE)
SUT PDS AB 1 CT 36 (SUTURE) IMPLANT
SUT PROLENE 2 0 CT2 30 (SUTURE) IMPLANT
SUT PROLENE 2 0 KS (SUTURE) IMPLANT
SUT SILK 0 FSL (SUTURE) ×2 IMPLANT
SUT SILK 2 0 (SUTURE) ×4
SUT SILK 2 0 SH CR/8 (SUTURE) ×4 IMPLANT
SUT SILK 2-0 18XBRD TIE 12 (SUTURE) ×2 IMPLANT
SUT SILK 3 0 (SUTURE) ×4
SUT SILK 3 0 SH CR/8 (SUTURE) ×4 IMPLANT
SUT SILK 3-0 18XBRD TIE 12 (SUTURE) ×2 IMPLANT
SYR 10ML LL (SYRINGE) ×4 IMPLANT
SYS LAPSCP GELPORT 120MM (MISCELLANEOUS)
SYSTEM LAPSCP GELPORT 120MM (MISCELLANEOUS) IMPLANT
TOWEL OR 17X24 6PK STRL BLUE (TOWEL DISPOSABLE) ×4 IMPLANT
TOWEL OR 17X26 10 PK STRL BLUE (TOWEL DISPOSABLE) ×4 IMPLANT
TRAY FOLEY MTR SLVR 14FR STAT (SET/KITS/TRAYS/PACK) IMPLANT
TRAY LAPAROSCOPIC MC (CUSTOM PROCEDURE TRAY) ×4 IMPLANT
TRAY PROCTOSCOPIC FIBER OPTIC (SET/KITS/TRAYS/PACK) IMPLANT
TROCAR XCEL 12X100 BLDLESS (ENDOMECHANICALS) ×4 IMPLANT
TROCAR XCEL BLADELESS 5X75MML (TROCAR) ×4 IMPLANT
TROCAR XCEL BLUNT TIP 100MML (ENDOMECHANICALS) ×4 IMPLANT
TROCAR XCEL NON-BLD 11X100MML (ENDOMECHANICALS) ×4 IMPLANT
TUBE CONNECTING 12'X1/4 (SUCTIONS) ×2
TUBE CONNECTING 12X1/4 (SUCTIONS) ×4 IMPLANT
TUBING INSUF HEATED (TUBING) ×2 IMPLANT
TUBING INSUFFLATION (TUBING) ×4 IMPLANT
YANKAUER SUCT BULB TIP NO VENT (SUCTIONS) ×4 IMPLANT

## 2018-07-03 NOTE — Op Note (Signed)
Operative Note  Preoperative diagnosis:  1.  Urinary incontinence 2.  Fournier's gangrene  Postoperative diagnosis: 1.  Same  Procedure(s): 1.  Cystoscopy with suprapubic tube placement  Surgeon: Modena SlaterEugene Donnice Nielsen, MD  Assistants: None  Anesthesia: General  Complications: None immediate  EBL: Minimal  Specimens: 1.  None  Drains/Catheters: 1.  16 French council tip catheter as a suprapubic tube  Intraoperative findings: Normal urethra.  Some catheter edema of the bladder but there were no obvious masses.  Indication: 84 year old female with Fournier's gangrene on the perineum and buttocks status post debridements.  She is undergoing a diverting colostomy today and suprapubic tube placement was requested for urinary diversion.  Description of procedure:  The patient was identified and consent was obtained.  The patient was placed under general anesthesia and laparoscopic colostomy was performed.  Afterwards, the patient was placed in dorsal lithotomy.  Patient was prepped and draped in a standard sterile fashion and a timeout was performed.  She is she is on scheduled antibiotics  A flexible cystoscope was advanced into the urethra and into the bladder.  Complete cystoscopy was performed with no abnormal findings.  Of completely filled the bladder.  A fingerbreadth above the pubic bone, I introduced a spinal needle and introduced it into the bladder followed by aspiration.  Made an incision alongside this and removed it.  I then inserted the sharp trocar through that incision and into the bladder under visualization of the cystoscope.  I removed the inner trocar and placed a 16 French catheter through this.  I removed the outer sheath.  10 cc of sterile water was placed in the balloon.  I secured the catheter down with a 2-0 nylon stitch.  This concluded the operation.  Patient tolerated the procedure well and was stable postoperatively.  Plan: Continue the catheter for 6 weeks at  which point it can be changed in the clinic.

## 2018-07-03 NOTE — Anesthesia Preprocedure Evaluation (Signed)
Anesthesia Evaluation  Patient identified by MRN, date of birth, ID band Patient awake    Reviewed: Allergy & Precautions, NPO status , Patient's Chart, lab work & pertinent test results  Airway Mallampati: I  TM Distance: >3 FB Neck ROM: Full    Dental   Pulmonary sleep apnea , former smoker,    Pulmonary exam normal        Cardiovascular hypertension, Pt. on medications Normal cardiovascular exam     Neuro/Psych Anxiety Depression    GI/Hepatic GERD  Medicated and Controlled,  Endo/Other    Renal/GU      Musculoskeletal   Abdominal   Peds  Hematology   Anesthesia Other Findings   Reproductive/Obstetrics                             Anesthesia Physical Anesthesia Plan  ASA: III  Anesthesia Plan: General   Post-op Pain Management:    Induction: Intravenous  PONV Risk Score and Plan: 3 and Ondansetron and Treatment may vary due to age or medical condition  Airway Management Planned: Oral ETT  Additional Equipment:   Intra-op Plan:   Post-operative Plan: Extubation in OR  Informed Consent: I have reviewed the patients History and Physical, chart, labs and discussed the procedure including the risks, benefits and alternatives for the proposed anesthesia with the patient or authorized representative who has indicated his/her understanding and acceptance.     Plan Discussed with: CRNA and Surgeon  Anesthesia Plan Comments:         Anesthesia Quick Evaluation

## 2018-07-03 NOTE — Anesthesia Procedure Notes (Signed)
Procedure Name: Intubation Date/Time: 07/03/2018 9:51 AM Performed by: Adria Dillonkin, Haedyn Breau A, CRNA Pre-anesthesia Checklist: Patient identified, Emergency Drugs available, Suction available and Patient being monitored Patient Re-evaluated:Patient Re-evaluated prior to induction Oxygen Delivery Method: Circle system utilized Preoxygenation: Pre-oxygenation with 100% oxygen Induction Type: IV induction Ventilation: Mask ventilation without difficulty Laryngoscope Size: Miller and 2 Grade View: Grade I Tube type: Oral Tube size: 7.0 mm Number of attempts: 1 Airway Equipment and Method: Stylet Placement Confirmation: ETT inserted through vocal cords under direct vision,  positive ETCO2,  CO2 detector and breath sounds checked- equal and bilateral Secured at: 21 cm Tube secured with: Tape Dental Injury: Teeth and Oropharynx as per pre-operative assessment

## 2018-07-03 NOTE — Progress Notes (Signed)
Physical Therapy Wound Treatment Patient Details  Name: [name] MRN: [id number] Date of Birth: 13-Oct-1928  Today's Date: 07/03/2018 Time: 6967-8938 Time Calculation (min): 41 min  Subjective  Subjective: Pt emotional and crying in anticipation of OR this morning Patient and Family Stated Goals: Heal wound per pt and family member present Date of Onset: (Unsure) Prior Treatments: Surgical debridement 06/27/18 and 06/29/18  Pain Score: Pt premedicated. Reports it is more sensitive today.   Wound Assessment  Pressure Injury 06/27/18 Unstageable - Full thickness tissue loss in which the base of the ulcer is covered by slough (yellow, tan, gray, green or brown) and/or eschar (tan, brown or black) in the wound bed. Three areas of black eschar surrounded by blan (Active)  Wound Image   07/02/2018 12:00 PM  Dressing Type Moist to dry;ABD;Gauze (Comment) 07/03/2018  9:22 AM  Dressing Changed;Clean;Dry;Intact 07/03/2018  9:22 AM  Dressing Change Frequency Daily 07/03/2018  9:22 AM  State of Healing Non-healing 07/03/2018  9:22 AM  Site / Wound Assessment Pink;Red;Yellow;Black 07/03/2018  9:22 AM  % Wound base Red or Granulating 55% 07/03/2018  9:22 AM  % Wound base Yellow/Fibrinous Exudate 40% 07/03/2018  9:22 AM  % Wound base Black/Eschar 5% 07/03/2018  9:22 AM  % Wound base Other/Granulation Tissue (Comment) 0% 07/03/2018  9:22 AM  Peri-wound Assessment Intact;Erythema (blanchable) 07/03/2018  9:22 AM  Wound Length (cm) 16.2 cm 07/02/2018 12:00 PM  Wound Width (cm) 15 cm 07/02/2018 12:00 PM  Wound Depth (cm) 5 cm 07/02/2018 12:00 PM  Wound Surface Area (cm^2) 243 cm^2 07/02/2018 12:00 PM  Wound Volume (cm^3) 1215 cm^3 07/02/2018 12:00 PM  Tunneling (cm) 5.2 in center of wound 07/02/2018 12:00 PM  Undermining (cm) 2.1 cm 6-9:00, 3.9 cm 9-10:00, 3 cm 10-12:00 07/02/2018 12:00 PM  Margins Unattached edges (unapproximated) 07/03/2018  9:22 AM  Drainage Amount Moderate 07/03/2018  9:22 AM  Drainage Description Serosanguineous  07/03/2018  9:22 AM  Treatment Debridement (Selective);Hydrotherapy (Pulse lavage);Packing (Saline gauze) 07/03/2018  9:22 AM   Santyl applied to wound bed prior to applying dressing.     Hydrotherapy Pulsed lavage therapy - wound location: Buttock Pulsed Lavage with Suction (psi): 12 psi Pulsed Lavage with Suction - Normal Saline Used: 1000 mL Pulsed Lavage Tip: Tip with splash shield Selective Debridement Selective Debridement - Location: Buttock Selective Debridement - Tools Used: Forceps;Scissors Selective Debridement - Tissue Removed: Yellow and black necrotic tissue   Wound Assessment and Plan  Wound Therapy - Assess/Plan/Recommendations Wound Therapy - Clinical Statement: Progressing with debridement. Overall wound bed appears improved today. Noted a small extension of necrotic tissue into the periwound area around 7:00. This patient will benefit from continued hydrotherapy for selective removal of unviable tissue, to decrease bioburden and promote wound bed healing.  Wound Therapy - Functional Problem List: Decreased tolerance for OOB mobility/position changes Factors Delaying/Impairing Wound Healing: Immobility;Multiple medical problems;Altered sensation Hydrotherapy Plan: Debridement;Dressing change;Patient/family education;Pulsatile lavage with suction Wound Therapy - Frequency: 6X / week Wound Therapy - Follow Up Recommendations: Skilled nursing facility Wound Plan: See above  Wound Therapy Goals- Improve the function of patient's integumentary system by progressing the wound(s) through the phases of wound healing (inflammation - proliferation - remodeling) by: Decrease Necrotic Tissue to: 20% Decrease Necrotic Tissue - Progress: Progressing toward goal Increase Granulation Tissue to: 80% Increase Granulation Tissue - Progress: Progressing toward goal Goals/treatment plan/discharge plan were made with and agreed upon by patient/family: Yes Time For Goal Achievement: 7  days Wound Therapy - Potential for  Goals: Good  Goals will be updated until maximal potential achieved or discharge criteria met.  Discharge criteria: when goals achieved, discharge from hospital, MD decision/surgical intervention, no progress towards goals, refusal/missing three consecutive treatments without notification or medical reason.  GP     Thelma Comp 07/03/2018, 9:39 AM   Rolinda Roan, PT, DPT Acute Rehabilitation Services Pager: [phone]

## 2018-07-03 NOTE — Transfer of Care (Signed)
Immediate Anesthesia Transfer of Care Note  Patient: Rebecca KayGracie B Uhlig  Procedure(s) Performed: LAPAROSCOPIC DIVERTED COLOSTOMY (N/A Abdomen) INSERTION OF SUPRAPUBIC CATHETER (N/A Urethra)  Patient Location: PACU  Anesthesia Type:General  Level of Consciousness: awake, alert , drowsy and patient cooperative  Airway & Oxygen Therapy: Patient Spontanous Breathing and Patient connected to face mask oxygen  Post-op Assessment: Report given to RN and Post -op Vital signs reviewed and stable  Post vital signs: Reviewed and stable  Last Vitals:  Vitals Value Taken Time  BP 103/44 07/03/2018 11:53 AM  Temp    Pulse 66 07/03/2018 11:52 AM  Resp 21 07/03/2018 11:53 AM  SpO2 99 % 07/03/2018 11:52 AM  Vitals shown include unvalidated device data.  Last Pain:  Vitals:   07/03/18 0735  TempSrc: Oral  PainSc: 9          Complications: No apparent anesthesia complications

## 2018-07-03 NOTE — Anesthesia Postprocedure Evaluation (Signed)
Anesthesia Post Note  Patient: [name]  Procedure(s) Performed: LAPAROSCOPIC DIVERTED COLOSTOMY (N/A Abdomen) INSERTION OF SUPRAPUBIC CATHETER (N/A Urethra)     Patient location during evaluation: PACU Anesthesia Type: General Level of consciousness: awake and alert Pain management: pain level controlled Vital Signs Assessment: post-procedure vital signs reviewed and stable Respiratory status: spontaneous breathing, nonlabored ventilation, respiratory function stable and patient connected to nasal cannula oxygen Cardiovascular status: blood pressure returned to baseline and stable Postop Assessment: no apparent nausea or vomiting Anesthetic complications: no    Last Vitals:  Vitals:   07/03/18 1155 07/03/18 1208  BP: (!) 103/44 (!) 108/52  Pulse: 65   Resp: 18 20  Temp: (!) 36.3 C   SpO2: 94%     Last Pain:  Vitals:   07/03/18 1155  TempSrc:   PainSc: Asleep                 [name]

## 2018-07-03 NOTE — Progress Notes (Signed)
PROGRESS NOTE    [name]  UJW:[id number] DOB: 1928-08-06 DOA: 06/26/2018 PCP: Kirby Funk, MD      Brief Narrative:  [name] is a 84 y.o. F with HTN, hypothyroidism who presents with 1 week buttock pain, followed by malaise, diarrhea, and weakness.    Went to UC, where she was told she was "dehydrated".  Came to the ER, where she was found to have BP 80/40, leukocytosis, Cr 5.5, and lactic acidosis.  Given 30 cc/kg fluids, lactate cleared, BP improved somewhat, she was mentating well with MAP>60.  Started on ceftriaxone for presumed UTI.  CCM were consulted, recommended continued medical therapy with fluids, antibiotics.  CT abdomen showed ?enteritis, also buttock cellulitis.  Buttock examination showed cellulitis with necrosis.  Admitted for sepis/Fournier's.  Started on empiric antibiotics.  Surgery consulted for debridement.     Assessment & Plan:  Sepsis from Fournier's gangrene S/P excisional debridement of L gluteal necrotizing soft tissue infection, Dr. Cliffton Asters, 7/31 S/P Debridement of large sacral perineal and sacral wound, Dr. Luisa Hart, 8/2 S/P diverting colostomy placement and SP catheter placement 8/6 Wound culture polymicrobial.  No MRSA isolated, so vancomycin stopped.  BP remains good.  WBC still rising, no new fever.   -Continue Zosyn  -Currently day 4 since final debridement  -If no further debridements needed, will stop antibiotics -Hydrotherapy per Gen Surg -Consult to general surgery, appreciate expert cares   Acute renal failure on chronic kidney disease stage III Baseline creatinine 1.4-1.5.  Admitted with creatinine 5.5.  Creatinine continues to improve, UOP good.  Some mild swelling. -Avoid nephrotoxins -Hold home lasix for now -Daily BMP -Strict I/Os  Chronic normocytic anemia Doubted acute blood loss anemia Low suspicion for GI bleed.  Hemoglobin stable at baseline 10-11.   FOBT negative. -Trend Hgb post-op today -Transfusion threshold 7  g/dL -Gastroenterology were consulted, they have signed off  Hypothyroidism -Continue levothyroxine  Hypertension Hypotensive at admission, BP remains normotensive off medicines. -Hold Lasix, ARB  Other medications -Continue allopurinol, Zoloft  Enteritis This is a nonspecific finding on CT.  Monitor stools.         DVT prophylaxis: SCDs Code Status: DO NOT RESUSCITATE Family Communication: Daughter and son in law at bedside MDM and disposition Plan: The below labs and imaging reports were reviewed and summarized above.  Medication management as above.  The patient was admitted with sepsis from Fournier's gangrene.  General surgery were consulted and debrided this twice.  She was briefly on pressors for septic shock after the first debridement, but has had stable blood pressure on IV fluids since then.  Colostomy placed 8/6.   She has resolving renal failure, also a new anemia which appears to be stable.  Nephrology and gastroenterology were consulted, but we have a low suspicion for GI bleed, and her renal function is improving, and so both have signed off.  Per surgery, will require further hydrotherapy.  When stable for Discharge from surgical standpoint, will plan for disposition either to SNF or home with home health (family prefer latter if feasible)       Consultants:   GI  Nephrology  General Surgery  Procedures:   None  Antimicrobials:   Ceftriaxone 7/31 >> 7/31  Vancomycin 7/31 >> 8/5  Flagyl 7/31 >> 7/31  Clindamycin 7/31 >> 8/5  Zosyn 7/31 >>   Cultures:   7/30 Blood cultures x2: No growth  7/30 Urine culture: Enterobacter, Zosyn susceptible  7/31 intraoperative culture: Polymicrobial  Subjective: No new fever, vomiting, confusion.  Her buttock pain is much improved.  She is sleepy after her colostomy placement, but has no abdominal pain, confusion, focal weakness.     Objective: Vitals:   07/03/18 1400 07/03/18 1430  07/03/18 1500 07/03/18 1941  BP: (!) 116/53 120/63 (!) 117/48 (!) 108/51  Pulse: 61 61 60 (!) 108  Resp: (!) 22 (!) 22 18 18   Temp:   98 F (36.7 C) 97.8 F (36.6 C)  TempSrc:   Oral Oral  SpO2: 96% 96% 97% 96%  Weight:      Height:        Intake/Output Summary (Last 24 hours) at 07/03/2018 2155 Last data filed at 07/03/2018 1817 Gross per 24 hour  Intake 1539.53 ml  Output 1205 ml  Net 334.53 ml   Filed Weights   07/01/18 0516 07/02/18 0501 07/03/18 0610  Weight: 111.8 kg (246 lb 7.6 oz) 111.9 kg (246 lb 11.1 oz) 115.7 kg (255 lb)    Examination: General appearance: Obese adult female, lying in bed, interactive, awake, tired. HEENT: Anicteric, conjunctival pink, lids and lashes normal.  No nasal deformity, discharge, or epistaxis.  Lips moist, dentures in place.  Oropharynx moist, no oral lesions, hearing normal.. Skin: Skin is warm and dry, wound not evaluated today. Cardiac: RRR, no murmurs, no lower extremity edema, mild diffuse nonpitting edema in the arms Respiratory: Normal respiratory rate and rhythm, lungs clear without rales or wheezes Abdomen: Colostomy in place, I believe the urostomy enters the colostomy bag, although not sure.  Abdomen nonfocal mild tenderness, voluntary guarding. MSK: No deformities or effusions. Neuro: Alert and oriented, extraocular movements intact, upper extremity strength is 4/5, symmetric, speech fluent. Psych: Sensorium intact and responding to questions, intention normal, affect normal, judgment and insight appear normal.    Data Reviewed: I have personally reviewed following labs and imaging studies:  CBC: Recent Labs  Lab 06/30/18 0422 07/01/18 0311 07/02/18 0356 07/03/18 0447 07/03/18 1557  WBC 15.4* 20.4* 21.4* 20.4* 26.0*  HGB 8.0* 8.3* 8.2* 8.4* 9.4*  HCT 24.3* 26.1* 25.2* 26.0* 30.1*  MCV 92.7 93.9 94.7 94.5 97.4  PLT 102* 101* 109* 111* 125*   Basic Metabolic Panel: Recent Labs  Lab 06/27/18 0003  06/27/18 2332  06/29/18 0432 06/30/18 0422 07/01/18 0311 07/02/18 0356 07/03/18 0447  NA  --    < > 143 145 144 143 143 143  K  --    < > 4.3 3.7 3.9 4.8 3.9 3.9  CL  --    < > 112* 114* 117* 117* 117* 115*  CO2  --    < > 16* 14* 15* 19* 16* 16*  GLUCOSE  --    < > 103* 131* 150* 108* 103* 111*  BUN  --    < > 94* 81* 73* 72* 59* 47*  CREATININE  --    < > 3.82* 2.62* 2.07* 1.91* 1.76* 1.65*  CALCIUM  --    < > 7.5* 8.0* 8.3* 8.0* 8.1* 8.1*  MG 2.1  --  2.0 2.1  --   --   --   --   PHOS  --   --   --  5.1*  --   --   --   --    < > = values in this interval not displayed.   GFR: Estimated Creatinine Clearance: 29.4 mL/min (A) (by C-G formula based on SCr of 1.65 mg/dL (H)). Liver Function Tests: Recent Labs  Lab 06/27/18  0416  AST 19  ALT 12  ALKPHOS 101  BILITOT 1.3*  PROT 5.3*  ALBUMIN 2.2*   No results for input(s): LIPASE, AMYLASE in the last 168 hours. No results for input(s): AMMONIA in the last 168 hours. Coagulation Profile: No results for input(s): INR, PROTIME in the last 168 hours. Cardiac Enzymes: Recent Labs  Lab 06/27/18 0003 06/27/18 0416 06/27/18 1039  TROPONINI <0.03 <0.03 0.03*   BNP (last 3 results) No results for input(s): PROBNP in the last 8760 hours. HbA1C: No results for input(s): HGBA1C in the last 72 hours. CBG: Recent Labs  Lab 06/27/18 2228 06/29/18 0936  GLUCAP 98 119*   Lipid Profile: No results for input(s): CHOL, HDL, LDLCALC, TRIG, CHOLHDL, LDLDIRECT in the last 72 hours. Thyroid Function Tests: No results for input(s): TSH, T4TOTAL, FREET4, T3FREE, THYROIDAB in the last 72 hours. Anemia Panel: No results for input(s): VITAMINB12, FOLATE, FERRITIN, TIBC, IRON, RETICCTPCT in the last 72 hours. Urine analysis:    Component Value Date/Time   COLORURINE YELLOW 06/27/2018 0043   APPEARANCEUR HAZY (A) 06/27/2018 0043   LABSPEC 1.012 06/27/2018 0043   PHURINE 5.0 06/27/2018 0043   GLUCOSEU NEGATIVE 06/27/2018 0043   HGBUR NEGATIVE  06/27/2018 0043   BILIRUBINUR NEGATIVE 06/27/2018 0043   KETONESUR NEGATIVE 06/27/2018 0043   PROTEINUR NEGATIVE 06/27/2018 0043   NITRITE NEGATIVE 06/27/2018 0043   LEUKOCYTESUR SMALL (A) 06/27/2018 0043   Sepsis Labs: @LABRCNTIP (procalcitonin:4,lacticacidven:4)  ) Recent Results (from the past 240 hour(s))  Urine Culture     Status: Abnormal   Collection Time: 06/27/18 12:43 AM  Result Value Ref Range Status   Specimen Description URINE, RANDOM  Final   Special Requests   Final    NONE Performed at [facility], [address] [place], [place] [address]    Culture >=100,000 COLONIES/mL ENTEROBACTER CLOACAE (A)  Final   Report Status 06/29/2018 FINAL  Final   Organism ID, Bacteria ENTEROBACTER CLOACAE (A)  Final      Susceptibility   Enterobacter cloacae - MIC*    CEFAZOLIN >=64 RESISTANT Resistant     CEFTRIAXONE 2 SENSITIVE Sensitive     CIPROFLOXACIN <=0.25 SENSITIVE Sensitive     GENTAMICIN <=1 SENSITIVE Sensitive     IMIPENEM <=0.25 SENSITIVE Sensitive     NITROFURANTOIN 64 INTERMEDIATE Intermediate     TRIMETH/SULFA <=20 SENSITIVE Sensitive     PIP/TAZO 8 SENSITIVE Sensitive     * >=100,000 COLONIES/mL ENTEROBACTER CLOACAE  MRSA PCR Screening     Status: None   Collection Time: 06/27/18  3:16 AM  Result Value Ref Range Status   MRSA by PCR NEGATIVE NEGATIVE Final    Comment:        The GeneXpert MRSA Assay (FDA approved for NASAL specimens only), is one component of a comprehensive MRSA colonization surveillance program. It is not intended to diagnose MRSA infection nor to guide or monitor treatment for MRSA infections. Performed at [facility], [address] [place], [place] [address]   Culture, blood (Routine X 2) w Reflex to ID Panel     Status: None   Collection Time: 06/27/18  4:05 AM  Result Value Ref Range Status   Specimen Description BLOOD LEFT ANTECUBITAL  Final   Special Requests IN PEDIATRIC BOTTLE Blood Culture adequate volume   Final   Culture   Final    NO GROWTH 5 DAYS Performed at [facility], [address] [place], [place] [address]  Report Status 07/02/2018 FINAL  Final  Culture, blood (Routine X 2) w Reflex to ID Panel     Status: None   Collection Time: 06/27/18  4:20 AM  Result Value Ref Range Status   Specimen Description BLOOD LEFT HAND  Final   Special Requests IN PEDIATRIC BOTTLE Blood Culture adequate volume  Final   Culture   Final    NO GROWTH 5 DAYS Performed at [facility], [address], [place], [place] [address]    Report Status 07/02/2018 FINAL  Final  Fungus Culture With Stain     Status: None (Preliminary result)   Collection Time: 06/27/18  6:01 PM  Result Value Ref Range Status   Fungus Stain Final report  Final    Comment: (NOTE) Performed At: [facility] [address] [place], [place] [address] Jolene Schimke MD QM:[id number]    Fungus (Mycology) Culture PENDING  Incomplete   Fungal Source ABSCESS  Final    Comment: LEFT BUTTOCKS ON VANC Performed at [facility], [address] [place], [place] [address]   Aerobic/Anaerobic Culture (surgical/deep wound)     Status: None   Collection Time: 06/27/18  6:01 PM  Result Value Ref Range Status   Specimen Description ABSCESS  Final   Special Requests NONE  Final   Gram Stain   Final    NO WBC SEEN MODERATE GRAM POSITIVE COCCI IN PAIRS MODERATE GRAM NEGATIVE COCCOBACILLI Performed at [facility], [address] [place], [place] [address]    Culture   Final    MODERATE BACTEROIDES SPECIES BETA LACTAMASE POSITIVE RARE KLEBSIELLA PNEUMONIAE    Report Status 07/01/2018 FINAL  Final   Organism ID, Bacteria KLEBSIELLA PNEUMONIAE  Final      Susceptibility   Klebsiella pneumoniae - MIC*    AMPICILLIN RESISTANT Resistant     CEFAZOLIN <=4 SENSITIVE Sensitive     CEFEPIME <=1 SENSITIVE Sensitive     CEFTAZIDIME <=1 SENSITIVE Sensitive     CEFTRIAXONE <=1 SENSITIVE Sensitive      CIPROFLOXACIN <=0.25 SENSITIVE Sensitive     GENTAMICIN <=1 SENSITIVE Sensitive     IMIPENEM <=0.25 SENSITIVE Sensitive     TRIMETH/SULFA <=20 SENSITIVE Sensitive     AMPICILLIN/SULBACTAM <=2 SENSITIVE Sensitive     PIP/TAZO <=4 SENSITIVE Sensitive     Extended ESBL NEGATIVE Sensitive     * RARE KLEBSIELLA PNEUMONIAE  Fungus Culture Result     Status: None   Collection Time: 06/27/18  6:01 PM  Result Value Ref Range Status   Result 1 Comment  Final    Comment: (NOTE) KOH/Calcofluor preparation:  no fungus observed. Performed At: [facility] [address] [place], [place] [address] Jolene Schimke MD QI:[id number]   Surgical pcr screen     Status: None   Collection Time: 06/29/18  8:56 AM  Result Value Ref Range Status   MRSA, PCR NEGATIVE NEGATIVE Final   Staphylococcus aureus NEGATIVE NEGATIVE Final    Comment: (NOTE) The Xpert SA Assay (FDA approved for NASAL specimens in patients 62 years of age and older), is one component of a comprehensive surveillance program. It is not intended to diagnose infection nor to guide or monitor treatment. Performed at [facility], [address] [place], [place] [address]          Radiology Studies: No results found.      Scheduled Meds: . allopurinol  100 mg Oral Daily  . collagenase   Topical  BID  . [START ON 07/04/2018] enoxaparin (LOVENOX) injection  40 mg Subcutaneous Q24H  . levothyroxine  137 mcg Oral QAC breakfast  . pantoprazole  40 mg Oral Daily  . sertraline  50 mg Oral Daily   Continuous Infusions: . piperacillin-tazobactam (ZOSYN)  IV 3.375 g (07/03/18 1355)     LOS: 7 days    Time spent: 25 minutes    Alberteen Sam, MD [facility] 07/03/2018, 9:55 PM     Pager [phone] --- please page though AMION:  [email or web address] If 7PM-7AM, please contact night-coverage

## 2018-07-03 NOTE — Op Note (Signed)
07/03/2018  10:44 AM  PATIENT:  [name]  84 y.o. female  PRE-OPERATIVE DIAGNOSIS:  Incontinence, sacral wound  POST-OPERATIVE DIAGNOSIS:  Incontinence, sacral wound  PROCEDURE:  Procedure(s): LAPAROSCOPIC DIVERTING LOOP COLOSTOMY (N/A)   SURGEON:  Surgeon(s) and Role: Panel 1:    Axel Filler* Shukri Nistler, MD - Primary  PHYSICIAN ASSISTANT: Bailey MechLiz Simaan, PA-C  ANESTHESIA:   local and general  EBL:  minimal   BLOOD ADMINISTERED:none  DRAINS: none   LOCAL MEDICATIONS USED:  BUPIVICAINE   SPECIMEN:  No Specimen  DISPOSITION OF SPECIMEN:  N/A  COUNTS:  YES  TOURNIQUET:  * No tourniquets in log *  DICTATION: .Dragon Dictation  Indication procedure: Patient is a 84 year old female with a large sacral wound secondary to infection patient had multiple debridements.  Patient was incontinent both of stool and urine.  Secondary to this and to promote wound healing patient underwent diverting colostomy.  Patient to undergo suprapubic catheter placement per urology.  Details of procedure: Urology, Dr. Alvester MorinBell, will dictate his portion under separate cover.  At the patient was consented she was taken back to the operating placed supine position bilateral SCDs in placed.  She underwent general trach intubation.  Patient was then placed in lithotomy position.  Patient was then prepped draped sterile fashion.  At this time a Veress needle technique was used to inflate the abdomen and left subcostal margin.  Subsequent to this a 5 mm trocar and camera placed intra-abdominal.  There is no injury to intra-abdominal organs.  A second 5 mm trocar was placed in the right upper quadrant under direct visualization.  At this time the patient was positioned.  The omentum was then mobilized cephalad.  The transverse colon easily visualized.  This time this brought up to the anterior abdominal wall to the area of the epigastrium.  Over the left rectus muscle.  At this time a circumferential incision was  made at the area of the transverse loop colostomy site.  A portion of fat was then excised from the subcutaneous tissue.  The anterior rectus fascia was then incised in cruciate fashion.  The muscles were retracted laterally and medially.  The posterior fascia was then incised.  At this time the transverse colon was brought up to the colostomy site.  A mesenteric window was made.  A 20 French red rubber catheter was then used as a bridge and placed around the loop colostomy site.  At this time a colotomy was made.  This was done in a longitudinal fashion over the area of the tinea coli.  3-0 Vicryl's were then used to enter the fashion to mature the colostomy.  The red rubber was then secured using 0 silk.  At this time the trocar sites were then reapproximated using 4-0 Monocryl subcuticular fashion.  These were dressed with Dermabond.  The colostomy device was then placed over the loop colostomy.  The patient tired procedure well.  Patient was left intubated under general anesthesia for Dr. Shannan HarperBell's portion of the case.  PLAN OF CARE: Admit to inpatient   PATIENT DISPOSITION:  PACU - hemodynamically stable.   Delay start of Pharmacological VTE agent (>24hrs) due to surgical blood loss or risk of bleeding: no

## 2018-07-03 NOTE — Progress Notes (Signed)
Pt is using home cpap able to manage herself. RT will monitor as needed.

## 2018-07-04 ENCOUNTER — Encounter (HOSPITAL_COMMUNITY): Payer: Self-pay | Admitting: General Surgery

## 2018-07-04 DIAGNOSIS — R0682 Tachypnea, not elsewhere classified: Secondary | ICD-10-CM

## 2018-07-04 DIAGNOSIS — T380X5A Adverse effect of glucocorticoids and synthetic analogues, initial encounter: Secondary | ICD-10-CM

## 2018-07-04 DIAGNOSIS — D72829 Elevated white blood cell count, unspecified: Secondary | ICD-10-CM

## 2018-07-04 DIAGNOSIS — E039 Hypothyroidism, unspecified: Secondary | ICD-10-CM

## 2018-07-04 DIAGNOSIS — I5189 Other ill-defined heart diseases: Secondary | ICD-10-CM

## 2018-07-04 DIAGNOSIS — G4733 Obstructive sleep apnea (adult) (pediatric): Secondary | ICD-10-CM

## 2018-07-04 DIAGNOSIS — N179 Acute kidney failure, unspecified: Secondary | ICD-10-CM

## 2018-07-04 DIAGNOSIS — R5381 Other malaise: Secondary | ICD-10-CM

## 2018-07-04 DIAGNOSIS — R739 Hyperglycemia, unspecified: Secondary | ICD-10-CM

## 2018-07-04 DIAGNOSIS — G8918 Other acute postprocedural pain: Secondary | ICD-10-CM

## 2018-07-04 LAB — BASIC METABOLIC PANEL
ANION GAP: 14 (ref 5–15)
BUN: 38 mg/dL — AB (ref 8–23)
CHLORIDE: 113 mmol/L — AB (ref 98–111)
CO2: 15 mmol/L — AB (ref 22–32)
Calcium: 8.1 mg/dL — ABNORMAL LOW (ref 8.9–10.3)
Creatinine, Ser: 1.58 mg/dL — ABNORMAL HIGH (ref 0.44–1.00)
GFR calc Af Amer: 33 mL/min — ABNORMAL LOW (ref >60)
GFR, EST NON AFRICAN AMERICAN: 28 mL/min — AB (ref >60)
GLUCOSE: 147 mg/dL — AB (ref 70–99)
Potassium: 4.2 mmol/L (ref 3.5–5.1)
Sodium: 142 mmol/L (ref 135–145)

## 2018-07-04 LAB — CBC
HEMATOCRIT: 27.5 % — AB (ref 36.0–46.0)
HEMOGLOBIN: 8.7 g/dL — AB (ref 12.0–15.0)
MCH: 30.4 pg (ref 26.0–34.0)
MCHC: 31.6 g/dL (ref 30.0–36.0)
MCV: 96.2 fL (ref 78.0–100.0)
PLATELETS: 128 10*3/uL — AB (ref 150–400)
RBC: 2.86 MIL/uL — ABNORMAL LOW (ref 3.87–5.11)
RDW: 15.7 % — ABNORMAL HIGH (ref 11.5–15.5)
WBC: 19.8 10*3/uL — AB (ref 4.0–10.5)

## 2018-07-04 MED ORDER — AMOXICILLIN-POT CLAVULANATE 875-125 MG PO TABS
1.0000 | ORAL_TABLET | Freq: Two times a day (BID) | ORAL | Status: AC
  Administered 2018-07-04 – 2018-07-09 (×12): 1 via ORAL
  Filled 2018-07-04 (×12): qty 1

## 2018-07-04 MED ORDER — CEPHALEXIN 500 MG PO CAPS: 500.0000 mg | ORAL_CAPSULE | Freq: Two times a day (BID) | ORAL | Status: DC

## 2018-07-04 MED ORDER — ENSURE ENLIVE PO LIQD
237.0000 mL | Freq: Two times a day (BID) | ORAL | Status: DC
  Administered 2018-07-05 – 2018-07-11 (×13): 237 mL via ORAL

## 2018-07-04 MED ORDER — PRO-STAT SUGAR FREE PO LIQD
30.0000 mL | Freq: Two times a day (BID) | ORAL | Status: DC
  Administered 2018-07-04 – 2018-07-11 (×14): 30 mL via ORAL
  Filled 2018-07-04 (×14): qty 30

## 2018-07-04 NOTE — Progress Notes (Signed)
Inpatient Rehabilitation  Per PT request, patient was screened by Fae PippinMelissa Donaldson Richter for appropriateness for an Inpatient Acute Rehab consult.  At this time we are recommending an Inpatient Rehab consult as well as OT orders.  Text paged attending MD to notify.  Please order if you are agreeable.    Charlane FerrettiMelissa Katanya Schlie, M.A., CCC/SLP Admission Coordinator  [facility]  Cell [phone]

## 2018-07-04 NOTE — Progress Notes (Signed)
[facility]  PROGRESS NOTE  [name] RUE:[id number] DOB: May 27, 1928 DOA: 06/26/2018 PCP: Kirby Funk, MD   Brief HPI:   84 year old female with a history of hypothyroidism came with 1 week history of buttock pain, malaise, diarrhea and weakness.  She went to urgent care and she was told that she was dehydrated.  She came to the ER was found to have BP 80/40, leukocytosis, creatinine 5.5, lactic acidosis.  Patient improved after she was given fluid boluses.  Started on ceftriaxone for presumed UTI.  CT abdomen showed enteritis, buttock cellulitis.  Examination of buttock showed cellulitis with necrosis.  Patient admitted for sepsis/Fournier's gangrene.  Started on empiric antibiotics.  Surgery was consulted for debridement.    Subjective   Patient seen and examined, denies any pain.   Assessment/Plan:     1. Sepsis from Fournier's gangrene-resolved, status post debridement of left gluteal necrotizing soft tissue infection by Dr. Cliffton Asters on 06/27/2018.  Status post debridement of large sacral, perineal wound by Dr. Luisa Hart on 06/29/2018, status post diverting colostomy placement and status post catheter placement 07/03/2018.  Wound culture grew Klebsiella pneumoniae sensitive to cefazolin.  Will start Keflex.  Will discontinue IV Zosyn.  WBC is slowly coming down to 19,000.  Follow CBC in a.m.  Blood cultures x2 remain negative to date.  Fungus culture is still pending.  Continue hydrotherapy, dressing changes as per surgery recommendations. 2. Acute kidney injury-patient's baseline creatinine is 1.4-1.5, she came in with creatinine of 5.5.  Creatinine is slowly improving.  Home Lasix is currently on hold.  Today creatinine is 1.58.  Almost going to baseline.  Follow BMP in am. 3. Chronic normocytic anemia-patient's baseline hemoglobin is around 10-11.  FOBT negative.  There was low suspicion for GI bleed, gastroenterology was consulted for enteritis but at that time patient was found to have  buttock cellulitis and abscess so GI signed off.  Hemoglobin stable at 8.7.  Closely monitor patient's globin and transfuse for hemoglobin less than 7. 4. Hypothyroidism-continue Synthroid 5. History of hypertension-patient was hypotensive admission.  Blood pressure remained stable.  Currently Lasix and ARB on hold 6. Enteritis-nonspecific finding seen on CT scan. No diarrhea,  monitor stools.    DVT prophylaxis: SCDs  Code Status: DNR  Family Communication: Discussed with patient's family at bedside  Disposition Plan: likely home when medically ready for discharge   Consultants:  GI  Nephrology  General surgery  Procedures:  None   Antibiotics:   Anti-infectives (From admission, onward)   Start     Dose/Rate Route Frequency Ordered Stop   06/30/18 1300  piperacillin-tazobactam (ZOSYN) IVPB 3.375 g     3.375 g 12.5 mL/hr over 240 Minutes Intravenous Every 8 hours 06/30/18 1242     06/29/18 0900  vancomycin (VANCOCIN) 1,500 mg in sodium chloride 0.9 % 500 mL IVPB  Status:  Discontinued     1,500 mg 250 mL/hr over 120 Minutes Intravenous Every 48 hours 06/29/18 0805 07/02/18 1516   06/28/18 1055  vancomycin variable dose per unstable renal function (pharmacist dosing)  Status:  Discontinued      Does not apply See admin instructions 06/28/18 1055 06/29/18 0805   06/27/18 2345  piperacillin-tazobactam (ZOSYN) IVPB 2.25 g  Status:  Discontinued     2.25 g 100 mL/hr over 30 Minutes Intravenous Every 8 hours 06/27/18 2336 06/30/18 1242   06/27/18 2315  vancomycin (VANCOCIN) IVPB 750 mg/150 ml premix  Status:  Discontinued    Note to Pharmacy:  Pharmacy  to dose and monitor levels as appropriate for necrotizing fascitis   750 mg 150 mL/hr over 60 Minutes Intravenous Every 12 hours 06/27/18 2303 06/27/18 2329   06/27/18 2315  clindamycin (CLEOCIN) IVPB 600 mg  Status:  Discontinued     600 mg 100 mL/hr over 30 Minutes Intravenous Every 8 hours 06/27/18 2309 07/02/18 1437    06/27/18 1215  vancomycin (VANCOCIN) 1,500 mg in sodium chloride 0.9 % 500 mL IVPB     1,500 mg 250 mL/hr over 120 Minutes Intravenous  Once 06/27/18 1142 06/27/18 1500   06/27/18 1100  metroNIDAZOLE (FLAGYL) IVPB 500 mg  Status:  Discontinued     500 mg 100 mL/hr over 60 Minutes Intravenous Every 8 hours 06/27/18 1052 06/27/18 2303   06/27/18 0200  cefTRIAXone (ROCEPHIN) 1 g in sodium chloride 0.9 % 100 mL IVPB  Status:  Discontinued     1 g 200 mL/hr over 30 Minutes Intravenous Every 24 hours 06/27/18 0138 06/27/18 2258       Objective   Vitals:   07/04/18 0047 07/04/18 0500 07/04/18 0530 07/04/18 0803  BP: (!) 121/94  106/61 108/74  Pulse: 61  61 61  Resp: (!) 22  20 (!) 23  Temp: 97.7 F (36.5 C)  98.4 F (36.9 C) 97.8 F (36.6 C)  TempSrc: Oral  Oral Oral  SpO2: 96%  97% 97%  Weight:  115.3 kg (254 lb 3.1 oz)    Height:        Intake/Output Summary (Last 24 hours) at 07/04/2018 1201 Last data filed at 07/04/2018 0806 Gross per 24 hour  Intake 346.6 ml  Output 1180 ml  Net -833.4 ml   Filed Weights   07/02/18 0501 07/03/18 0610 07/04/18 0500  Weight: 111.9 kg (246 lb 11.1 oz) 115.7 kg (255 lb) 115.3 kg (254 lb 3.1 oz)     Physical Examination:    General: Appears in no acute distress  Cardiovascular: S1-S2, regular, trace edema of the upper extremities  Respiratory: Clear to auscultation bilaterally.  Abdomen: Colostomy in place.  Nontender to palpation.  Extremities: No edema of the lower extremities.  Neurologic: Alert, oriented x3, no focal deficit.     Data Reviewed: I have personally reviewed following labs and imaging studies  CBG: Recent Labs  Lab 06/27/18 2228 06/29/18 0936  GLUCAP 98 119*    CBC: Recent Labs  Lab 07/01/18 0311 07/02/18 0356 07/03/18 0447 07/03/18 1557 07/04/18 0318  WBC 20.4* 21.4* 20.4* 26.0* 19.8*  HGB 8.3* 8.2* 8.4* 9.4* 8.7*  HCT 26.1* 25.2* 26.0* 30.1* 27.5*  MCV 93.9 94.7 94.5 97.4 96.2  PLT 101* 109*  111* 125* 128*    Basic Metabolic Panel: Recent Labs  Lab 06/27/18 2332 06/29/18 0432 06/30/18 0422 07/01/18 0311 07/02/18 0356 07/03/18 0447 07/04/18 0318  NA 143 145 144 143 143 143 142  K 4.3 3.7 3.9 4.8 3.9 3.9 4.2  CL 112* 114* 117* 117* 117* 115* 113*  CO2 16* 14* 15* 19* 16* 16* 15*  GLUCOSE 103* 131* 150* 108* 103* 111* 147*  BUN 94* 81* 73* 72* 59* 47* 38*  CREATININE 3.82* 2.62* 2.07* 1.91* 1.76* 1.65* 1.58*  CALCIUM 7.5* 8.0* 8.3* 8.0* 8.1* 8.1* 8.1*  MG 2.0 2.1  --   --   --   --   --   PHOS  --  5.1*  --   --   --   --   --     Recent Results (from the past  240 hour(s))  Urine Culture     Status: Abnormal   Collection Time: 06/27/18 12:43 AM  Result Value Ref Range Status   Specimen Description URINE, RANDOM  Final   Special Requests   Final    NONE Performed at [facility], [address] [place], [place] [address]    Culture >=100,000 COLONIES/mL ENTEROBACTER CLOACAE (A)  Final   Report Status 06/29/2018 FINAL  Final   Organism ID, Bacteria ENTEROBACTER CLOACAE (A)  Final      Susceptibility   Enterobacter cloacae - MIC*    CEFAZOLIN >=64 RESISTANT Resistant     CEFTRIAXONE 2 SENSITIVE Sensitive     CIPROFLOXACIN <=0.25 SENSITIVE Sensitive     GENTAMICIN <=1 SENSITIVE Sensitive     IMIPENEM <=0.25 SENSITIVE Sensitive     NITROFURANTOIN 64 INTERMEDIATE Intermediate     TRIMETH/SULFA <=20 SENSITIVE Sensitive     PIP/TAZO 8 SENSITIVE Sensitive     * >=100,000 COLONIES/mL ENTEROBACTER CLOACAE  MRSA PCR Screening     Status: None   Collection Time: 06/27/18  3:16 AM  Result Value Ref Range Status   MRSA by PCR NEGATIVE NEGATIVE Final    Comment:        The GeneXpert MRSA Assay (FDA approved for NASAL specimens only), is one component of a comprehensive MRSA colonization surveillance program. It is not intended to diagnose MRSA infection nor to guide or monitor treatment for MRSA infections. Performed at [facility], [address] [place], [place] [address]   Culture, blood (Routine X 2) w Reflex to ID Panel     Status: None   Collection Time: 06/27/18  4:05 AM  Result Value Ref Range Status   Specimen Description BLOOD LEFT ANTECUBITAL  Final   Special Requests IN PEDIATRIC BOTTLE Blood Culture adequate volume  Final   Culture   Final    NO GROWTH 5 DAYS Performed at [facility], [address] [place], [place] [address]    Report Status 07/02/2018 FINAL  Final  Culture, blood (Routine X 2) w Reflex to ID Panel     Status: None   Collection Time: 06/27/18  4:20 AM  Result Value Ref Range Status   Specimen Description BLOOD LEFT HAND  Final   Special Requests IN PEDIATRIC BOTTLE Blood Culture adequate volume  Final   Culture   Final    NO GROWTH 5 DAYS Performed at [facility], [address] [place], [place] [address]    Report Status 07/02/2018 FINAL  Final  Fungus Culture With Stain     Status: None (Preliminary result)   Collection Time: 06/27/18  6:01 PM  Result Value Ref Range Status   Fungus Stain Final report  Final    Comment: (NOTE) Performed At: [facility] [address] [place], [place] [id number] Jolene SchimkeNagendra Sanjai MD NU:[id number]:[phone]    Fungus (Mycology) Culture PENDING  Incomplete   Fungal Source ABSCESS  Final    Comment: LEFT BUTTOCKS ON VANC Performed at [facility], [address] [place], [place] [address]   Aerobic/Anaerobic Culture (surgical/deep wound)     Status: None   Collection Time: 06/27/18  6:01 PM  Result Value Ref Range Status   Specimen Description ABSCESS  Final   Special Requests NONE  Final   Gram Stain   Final    NO WBC SEEN MODERATE GRAM POSITIVE COCCI IN PAIRS MODERATE GRAM NEGATIVE COCCOBACILLI Performed at [facility], [address] [place], [place] [address]    Culture   Final    MODERATE BACTEROIDES SPECIES BETA LACTAMASE POSITIVE RARE KLEBSIELLA PNEUMONIAE    Report Status 07/01/2018 FINAL  Final   Organism  ID, Bacteria KLEBSIELLA PNEUMONIAE  Final      Susceptibility   Klebsiella pneumoniae - MIC*    AMPICILLIN RESISTANT Resistant     CEFAZOLIN <=4 SENSITIVE Sensitive     CEFEPIME <=1 SENSITIVE Sensitive     CEFTAZIDIME <=1 SENSITIVE Sensitive     CEFTRIAXONE <=1 SENSITIVE Sensitive     CIPROFLOXACIN <=0.25 SENSITIVE Sensitive     GENTAMICIN <=1 SENSITIVE Sensitive     IMIPENEM <=0.25 SENSITIVE Sensitive     TRIMETH/SULFA <=20 SENSITIVE Sensitive     AMPICILLIN/SULBACTAM <=2 SENSITIVE Sensitive     PIP/TAZO <=4 SENSITIVE Sensitive     Extended ESBL NEGATIVE Sensitive     * RARE KLEBSIELLA PNEUMONIAE  Fungus Culture Result     Status: None   Collection Time: 06/27/18  6:01 PM  Result Value Ref Range Status   Result 1 Comment  Final    Comment: (NOTE) KOH/Calcofluor preparation:  no fungus observed. Performed At: [facility] [address] [place], [place] [address] Jolene Schimke MD XB:[id number]   Surgical pcr screen     Status: None   Collection Time: 06/29/18  8:56 AM  Result Value Ref Range Status   MRSA, PCR NEGATIVE NEGATIVE Final   Staphylococcus aureus NEGATIVE NEGATIVE Final    Comment: (NOTE) The Xpert SA Assay (FDA approved for NASAL specimens in patients 30 years of age and older), is one component of a comprehensive surveillance program. It is not intended to diagnose infection nor to guide or monitor treatment. Performed at [facility], [address] [place], [place] [address]      Liver Function Tests: No results for input(s): AST, ALT, ALKPHOS, BILITOT, PROT, ALBUMIN in the last 168 hours. No results for input(s): LIPASE, AMYLASE in the last 168 hours. No results for input(s): AMMONIA in the last 168 hours.  Cardiac Enzymes: No results for input(s): CKTOTAL, CKMB, CKMBINDEX, TROPONINI in the last 168 hours. BNP (last 3 results) No results for input(s): BNP in the last 8760 hours.  ProBNP (last 3 results) No results for input(s):  PROBNP in the last 8760 hours.    Studies: No results found.  Scheduled Meds: . allopurinol  100 mg Oral Daily  . collagenase   Topical BID  . enoxaparin (LOVENOX) injection  40 mg Subcutaneous Q24H  . levothyroxine  137 mcg Oral QAC breakfast  . sertraline  50 mg Oral Daily      Time spent: 25 min  Meredeth Ide   [facility] Pager [phone]. If 7PM-7AM, please contact night-coverage at [email or web address], Office  [phone]  password [email or web address]  07/04/2018, 12:01 PM  LOS: 8 days

## 2018-07-04 NOTE — Care Management Important Message (Signed)
Important Message  Patient Details  Name: [name] MRN: [id number] Date of Birth: 12/17/1927   Medicare Important Message Given:  Yes    [name] [place] 07/04/2018, 3:39 PM

## 2018-07-04 NOTE — Progress Notes (Signed)
ANTIBIOTIC CONSULT NOTE Pharmacy Consult for Zosyn Indication: Fourniers Gangrene  / UTI  Assessment: ID: day # 7 abx of Zosyn for fourniers gangrene on the perineum and buttock. Large wound of sacrum and perineum, including anal sphincter. Also has Enterobacter UTI   Goal of Therapy:  Appropriate dosing  Plan: Zosyn to 3.375 IV q8h - consider stopping or streamlining to Cefazolin for wound coverage if still needed.  (UTI should be treated)      Allergies  Allergen Reactions  . Morphine And Related Hives and Itching  . Gabapentin Other (See Comments)    Alters mood  . Lipitor [Atorvastatin] Itching and Rash  . Lyrica [Pregabalin] Other (See Comments)    Lethargic, mood changes  . Sulfa Antibiotics Rash    Labs: Recent Labs    07/02/18 0356 07/03/18 0447 07/03/18 1557 07/04/18 0318  WBC 21.4* 20.4* 26.0* 19.8*  HGB 8.2* 8.4* 9.4* 8.7*  PLT 109* 111* 125* 128*  CREATININE 1.76* 1.65*  --  1.58*   Estimated Creatinine Clearance: 30.7 mL/min (A) (by C-G formula based on SCr of 1.58 mg/dL (H)). Recent Labs    07/02/18 0356  [facility]     Thank you Okey RegalLisa Lyn Deemer, PharmD [phone]  07/04/2018,10:11 AM

## 2018-07-04 NOTE — Consult Note (Signed)
WOC Nurse ostomy follow up Surgical team and PT following for assessment and plan of care for buttocks wound and hydrotherapy. Stoma type/location: Colostomy stoma from surgery performed 8/6 red and viable Stomal assessment/size: 1 1/4 inches, flush with skin level, red rubber rod in place  Peristomal assessment:  Intact skin surrounding Treatment options for stomal/peristomal skin: Current pouch was beginning to leak behind the barrier.  Applied barrier ring to attempt to maintain a seal  Output: No stool or flatus Ostomy pouching: 1pc.  Education provided:  Demonstrated pouch change to patient and family member.  Family states they are familiar with pouch changes since another person close to them has one. Pt was able to open and close to empty with assistance and asked appropriate questions.  Supplies ordered to the bedside for staff nurse use.  Reviewed pouching routines and ordering supplies. WOC team will continue teaching sessions while in the hospital. Enrolled [place] in [facility] [facility] [employer] Discharge program: no Cammie Mcgeeawn Tiago Humphrey MSN, RN, [place], [place]AP, [place] [phone]

## 2018-07-04 NOTE — Consult Note (Signed)
Physical Medicine and Rehabilitation Consult Reason for Consult: Decreased functional mobility Referring Physician: Family medicine   HPI: [name] is a 84 y.o. right-handed female with history of hypothyroidism, OSA on CPAP.  Per chart review and patient, patient lives with grandson in [place].  Used a cane prior to admission due to end-stage osteoarthritis of the hip.  She has multiple family members in the area who plan to assist as needed.  Presented 06/26/2018 with diarrhea with black tarry stool and abdominal pain as well as severe hypotension.  Patient noted poor appetite for the past few days.  WBC 16,100, creatinine 5.6, hemoglobin 10.7, fecal occult blood positive, troponin negative, lactic acid elevated 2.96.  Echocardiogram with ejection fraction of 55% grade 2 diastolic dysfunction.  CT of abdomen and pelvis showed focal hazy inflammatory stranding adjacent to a loop of small bowel within the left abdomen suspicious for acute enteritis.  No associated obstruction.  Sigmoid diverticulosis without evidence for acute diverticulitis.  7 mm nodule at left lung base increased in prominence as compared to 2012. WOC follow-up for 3 dark and necrotic areas present on the left buttocks with surrounding erythema consistent with cellulitis extending all the way to her left labia.  Underwent excisional debridement of left gluteal necrotizing soft tissue infection 06/27/2018 per Dr. Cliffton Asters followed by debridement of large sacral perineal and sacral wound 06/29/2018 per Dr. Luisa Hart.  Urology consulted 07/01/2018 due to ongoing urinary incontinence which slowly progressed and underwent suprapubic tube placement 07/03/2018 per Dr. Alvester Morin as well as a laparoscopic diverting loop colostomy 07/03/2018 per Dr. Derrell Lolling.  Nephrology services consulted in regards to severe AKI felt to be related to hypotension and responding to IV fluids.  No current plan for diagnostic GI work-up noted.  Hospital course pain  management.  Intravenous Zosyn recently discontinued for necrotizing soft tissue infection changed to Augmentin.  Latest blood cultures negative to date.  Patient is currently receiving hydrotherapy's for sacral wounds.  Therapy evaluations completed with recommendations of physical medicine rehab consult.  Review of Systems  Constitutional: Negative for chills and fever.  HENT: Negative for hearing loss.   Eyes: Negative for blurred vision and double vision.  Respiratory: Positive for cough and shortness of breath.   Cardiovascular: Negative for chest pain and palpitations.  Gastrointestinal: Positive for abdominal pain, diarrhea and vomiting.       GERD  Musculoskeletal: Positive for joint pain and myalgias.  Skin: Negative for rash.  Psychiatric/Behavioral: Positive for depression.  All other systems reviewed and are negative.  Past Medical History:  Diagnosis Date  . Chest pain   . Depression   . GERD (gastroesophageal reflux disease)   . Hypertension   . Sleep apnea   . Thyroid disease    Past Surgical History:  Procedure Laterality Date  . APPENDECTOMY  1946  . CATARACT EXTRACTION     bilateral  . GALLBLADDER SURGERY    . INCISION AND DRAINAGE ABSCESS N/A 06/29/2018   Procedure: REPEAT INCISION AND DRAINAGE  BUTTOCK ABSCESS;  Surgeon: Harriette Bouillon, MD;  Location: [facility] OR;  Service: General;  Laterality: N/A;  . INCISION AND DRAINAGE PERIRECTAL ABSCESS Left 06/27/2018   Procedure: IRRIGATION AND DEBRIDEMENT BUTTOCK ABSCESS;  Surgeon: Andria Meuse, MD;  Location: [facility] OR;  Service: General;  Laterality: Left;  . INSERTION OF SUPRAPUBIC CATHETER N/A 07/03/2018   Procedure: INSERTION OF SUPRAPUBIC CATHETER;  Surgeon: Crista Elliot, MD;  Location: [facility] OR;  Service: Urology;  Laterality: N/A;  . KNEE ARTHROPLASTY  2006   left   . KNEE ARTHROPLASTY  2006   right   . LAPAROSCOPIC DIVERTED COLOSTOMY N/A 07/03/2018   Procedure: LAPAROSCOPIC DIVERTED COLOSTOMY;  Surgeon:  Axel Filler, MD;  Location: [facility] OR;  Service: General;  Laterality: N/A;  . SHOULDER ARTHROSCOPY  2009   right  . TOTAL HIP ARTHROPLASTY  2002    left   Family History  Problem Relation Age of Onset  . Diabetes Mother   . Stroke Mother   . Cancer Brother   . Kidney disease Sister    Social History:  reports that she has quit smoking. She has never used smokeless tobacco. She reports that she does not drink alcohol or use drugs. Allergies:  Allergies  Allergen Reactions  . Morphine And Related Hives and Itching  . Gabapentin Other (See Comments)    Alters mood  . Lipitor [Atorvastatin] Itching and Rash  . Lyrica [Pregabalin] Other (See Comments)    Lethargic, mood changes  . Sulfa Antibiotics Rash   Medications Prior to Admission  Medication Sig Dispense Refill  . albuterol (PROVENTIL HFA;VENTOLIN HFA) 108 (90 Base) MCG/ACT inhaler Inhale 2 puffs into the lungs every 6 (six) hours as needed for wheezing or shortness of breath.    . allopurinol (ZYLOPRIM) 100 MG tablet Take 200 mg by mouth daily.     Marland Kitchen ALPRAZolam (XANAX) 0.5 MG tablet Take 0.5 mg by mouth at bedtime.    . furosemide (LASIX) 80 MG tablet Take 40-80 mg by mouth See admin instructions. Take 1 tablet by mouth every other day alternating with 1/2 tablet. (ex. 80mg , 40mg , 80mg , 40mg .....)    . levothyroxine (SYNTHROID, LEVOTHROID) 137 MCG tablet Take 137 mcg by mouth daily before breakfast.    . losartan (COZAAR) 100 MG tablet Take 100 mg by mouth daily.    Marland Kitchen omeprazole (PRILOSEC) 40 MG capsule Take 40 mg by mouth daily.    . sertraline (ZOLOFT) 50 MG tablet Take 50 mg by mouth daily.    . calcium citrate-vitamin D (CITRACAL+D) 315-200 MG-UNIT per tablet Take 1 tablet by mouth daily.    Marland Kitchen guaiFENesin (MUCINEX) 600 MG 12 hr tablet Take 1 tablet (600 mg total) by mouth 2 (two) times daily as needed for cough or to loosen phlegm. (Patient not taking: Reported on 06/27/2018) 45 tablet 0  . Misc Natural Products  (GLUCOSAMINE CHONDROITIN VIT D3) CAPS Take 1 capsule by mouth daily.     . Multiple Vitamin (MULTIVITAMIN WITH MINERALS) TABS tablet Take 1 tablet by mouth daily.    . nitroGLYCERIN (NITROSTAT) 0.4 MG SL tablet Place 0.4 mg under the tongue every 5 (five) minutes as needed for chest pain.      Home: Home Living Family/patient expects to be discharged to:: Skilled nursing facility Living Arrangements: Children  Functional History:   Functional Status:  Mobility: Bed Mobility Overal bed mobility: Needs Assistance Bed Mobility: Sidelying to Sit, Sit to Sidelying Rolling: Min assist, +2 for physical assistance, +2 for safety/equipment, Total assist Sidelying to sit: Mod assist, +2 for physical assistance Sit to sidelying: Mod assist, Max assist, +2 for physical assistance General bed mobility comments: assist for legs off bed and to lift trunk with pt pushing up with railing, to sidelying +2 for legs onto bed and to guide shoulders into side position Transfers Overall transfer level: Needs assistance Equipment used: Rolling walker (2 wheeled) Transfers: Sit to/from Stand Sit to Stand: Mod assist, +2 physical assistance,  From elevated surface General transfer comment: lifting assist from slightly elevated bed; pt pulls up on walker, cues for technique, performed x 2 trials Ambulation/Gait Ambulation/Gait assistance: Min assist, +2 physical assistance Gait Distance (Feet): 2 Feet Assistive device: Rolling walker (2 wheeled) Gait Pattern/deviations: Step-to pattern, Decreased stride length, Trunk flexed General Gait Details: side steps to [facility] during first standing bout, then marching in place, noted R hip audible crepitus with standing and stepping activities    ADL:    Cognition: Cognition Overall Cognitive Status: Within Functional Limits for tasks assessed Orientation Level: (P) Oriented X4 Cognition Arousal/Alertness: Awake/alert Behavior During Therapy: WFL for tasks  assessed/performed Overall Cognitive Status: Within Functional Limits for tasks assessed  Blood pressure 108/74, pulse 61, temperature 97.8 F (36.6 C), temperature source Oral, resp. rate (!) 23, height 5\' 4"  (1.626 m), weight 115.3 kg (254 lb 3.1 oz), SpO2 97 %. Physical Exam  Vitals reviewed. Constitutional: She is oriented to person, place, and time. She appears well-developed.  84 year old right-handed obese female  HENT:  Head: Normocephalic and atraumatic.  Eyes: EOM are normal. Right eye exhibits no discharge. Left eye exhibits no discharge.  Neck: Normal range of motion. Neck supple. No thyromegaly present.  Cardiovascular: Normal rate and regular rhythm.  Respiratory: Effort normal and breath sounds normal. No respiratory distress.  GI: Soft. Bowel sounds are normal.  Colostomy in place  Genitourinary:  Genitourinary Comments: Suprapubic tube in place  Musculoskeletal:  No edema or tenderness in extremities  Neurological: She is alert and oriented to person, place, and time.  Mood is flat but appropriate. Motor: B/l UE 4-/5 proximal to distal LLE: 4-4+/5 proximal to distal RLE: HF 2+/5, KE 3-/5, ADF 4-/5 Sensation intact to light touch  Skin:  Sacral buttocks decubitus not examined  Psychiatric: She has a normal mood and affect. Her behavior is normal.    Results for orders placed or performed during the hospital encounter of 06/26/18 (from the past 24 hour(s))  CBC     Status: Abnormal   Collection Time: 07/03/18  3:57 PM  Result Value Ref Range   WBC 26.0 (H) 4.0 - 10.5 K/uL   RBC 3.09 (L) 3.87 - 5.11 MIL/uL   Hemoglobin 9.4 (L) 12.0 - 15.0 g/dL   HCT 16.1 (L) 09.6 - 04.5 %   MCV 97.4 78.0 - 100.0 fL   MCH 30.4 26.0 - 34.0 pg   MCHC 31.2 30.0 - 36.0 g/dL   RDW 40.9 (H) 81.1 - 91.4 %   Platelets 125 (L) 150 - 400 K/uL  CBC     Status: Abnormal   Collection Time: 07/04/18  3:18 AM  Result Value Ref Range   WBC 19.8 (H) 4.0 - 10.5 K/uL   RBC 2.86 (L) 3.87 -  5.11 MIL/uL   Hemoglobin 8.7 (L) 12.0 - 15.0 g/dL   HCT 78.2 (L) 95.6 - 21.3 %   MCV 96.2 78.0 - 100.0 fL   MCH 30.4 26.0 - 34.0 pg   MCHC 31.6 30.0 - 36.0 g/dL   RDW 08.6 (H) 57.8 - 46.9 %   Platelets 128 (L) 150 - 400 K/uL  Basic metabolic panel     Status: Abnormal   Collection Time: 07/04/18  3:18 AM  Result Value Ref Range   Sodium 142 135 - 145 mmol/L   Potassium 4.2 3.5 - 5.1 mmol/L   Chloride 113 (H) 98 - 111 mmol/L   CO2 15 (L) 22 - 32 mmol/L   Glucose, Bld 147 (H)  70 - 99 mg/dL   BUN 38 (H) 8 - 23 mg/dL   Creatinine, Ser 1.611.58 (H) 0.44 - 1.00 mg/dL   Calcium 8.1 (L) 8.9 - 10.3 mg/dL   GFR calc non Af Amer 28 (L) >60 mL/min   GFR calc Af Amer 33 (L) >60 mL/min   Anion gap 14 5 - 15   No results found.  Assessment/Plan: Diagnosis: Debility Labs independently reviewed.  Records reviewed and summated above.  1. Does the need for close, 24 hr/day medical supervision in concert with the patient's rehab needs make it unreasonable for this patient to be served in a less intensive setting? Yes  2. Co-Morbidities requiring supervision/potential complications: hypothyroidism (cont meds, ensure appropriate mood and energy level for therapies), OSA (cont CPAP, monitor for daytime somnolence), diastolic dysfunction (monitor for sign/symptoms of fluid overload), AKI (avoid nephrotoxic meds), post-op pain management (Biofeedback training with therapies to help reduce reliance on opiate pain medications, particularly IV fentanyl, monitor pain control during therapies, and sedation at rest and titrate to maximum efficacy to ensure participation and gains in therapies), tachypnea (monitor RR and O2 Sats with increased physical exertion), steroid induced hyperglycemia (Monitor in accordance with exercise and adjust meds as necessary), leukocytosis (cont to monitor for signs and symptoms of infection, further workup if indicated) 3. Due to safety, skin/wound care, disease management, pain  management and patient education, does the patient require 24 hr/day rehab nursing? Yes 4. Does the patient require coordinated care of a physician, rehab nurse, PT (1-2 hrs/day, 5 days/week) and OT (1-2 hrs/day, 5 days/week) to address physical and functional deficits in the context of the above medical diagnosis(es)? Yes Addressing deficits in the following areas: balance, endurance, locomotion, strength, transferring, bathing, dressing, toileting and psychosocial support 5. Can the patient actively participate in an intensive therapy program of at least 3 hrs of therapy per day at least 5 days per week? Yes 6. The potential for patient to make measurable gains while on inpatient rehab is excellent 7. Anticipated functional outcomes upon discharge from inpatient rehab are min assist  with PT, min assist and mod assist with OT, n/a with SLP. 8. Estimated rehab length of stay to reach the above functional goals is: 14-17 days. 9. Anticipated D/C setting: Home 10. Anticipated post D/C treatments: HH therapy and Home excercise program 11. Overall Rehab/Functional Prognosis: good  RECOMMENDATIONS: This patient's condition is appropriate for continued rehabilitative care in the following setting: CIR when medically stable and able to tolerate 3 hours of therapy/day. Patient has agreed to participate in recommended program. Yes Note that insurance prior authorization may be required for reimbursement for recommended care.  Comment: Rehab Admissions Coordinator to follow up.   I have personally performed a face to face diagnostic evaluation, including, but not limited to relevant history and physical exam findings, of this patient and developed relevant assessment and plan.  Additionally, I have reviewed and concur with the physician assistant's documentation above.   Maryla MorrowAnkit Ramonda Galyon, MD, ABPMR Mcarthur Rossettianiel J Angiulli, PA-C 07/04/2018

## 2018-07-04 NOTE — Progress Notes (Signed)
[facility] Progress Note  1 Day Post-Op  Subjective: CC: wound Patient sore in her bottom. Mild abdominal pain. Had some leakage from ostomy last night. Felt sick on her stomach yesterday but is feeling a little better today.   Objective: Vital signs in last 24 hours: Temp:  [97.3 F (36.3 C)-98.4 F (36.9 C)] 98.4 F (36.9 C) (08/07 0530) Pulse Rate:  [56-108] 61 (08/07 0530) Resp:  [15-22] 20 (08/07 0530) BP: (103-122)/(44-94) 106/61 (08/07 0530) SpO2:  [94 %-100 %] 97 % (08/07 0530) Weight:  [115.3 kg (254 lb 3.1 oz)] 115.3 kg (254 lb 3.1 oz) (08/07 0500) Last BM Date: 07/02/18  Intake/Output from previous day: 08/06 0701 - 08/07 0700 In: 1146.6 [P.O.:240; I.V.:800; IV Piggyback:106.6] Out: 955 [Urine:810; Stool:120; Blood:25] Intake/Output this shift: No intake/output data recorded.  PE: Gen:  Alert, NAD, pleasant Card:  Regular rate and rhythm Pulm:  Normal effort, clear to auscultation bilaterally Abd: Soft, non-tender, non-distended, bowel sounds present, no HSM, incisions C/D/I, stoma pink with red rubber bridge in place; SP tube in place Skin: warm and dry, no rashes  Psych: A&Ox3   Lab Results:  Recent Labs    07/03/18 1557 07/04/18 0318  WBC 26.0* 19.8*  HGB 9.4* 8.7*  HCT 30.1* 27.5*  PLT 125* 128*   BMET Recent Labs    07/03/18 0447 07/04/18 0318  NA 143 142  K 3.9 4.2  CL 115* 113*  CO2 16* 15*  GLUCOSE 111* 147*  BUN 47* 38*  CREATININE 1.65* 1.58*  CALCIUM 8.1* 8.1*   PT/INR No results for input(s): LABPROT, INR in the last 72 hours. CMP     Component Value Date/Time   NA 142 07/04/2018 0318   K 4.2 07/04/2018 0318   CL 113 (H) 07/04/2018 0318   CO2 15 (L) 07/04/2018 0318   GLUCOSE 147 (H) 07/04/2018 0318   BUN 38 (H) 07/04/2018 0318   CREATININE 1.58 (H) 07/04/2018 0318   CALCIUM 8.1 (L) 07/04/2018 0318   PROT 5.3 (L) 06/27/2018 0416   ALBUMIN 2.2 (L) 06/27/2018 0416   AST 19 06/27/2018 0416   ALT 12 06/27/2018  0416   ALKPHOS 101 06/27/2018 0416   BILITOT 1.3 (H) 06/27/2018 0416   GFRNONAA 28 (L) 07/04/2018 0318   GFRAA 33 (L) 07/04/2018 0318   Lipase  No results found for: LIPASE     Studies/Results: No results found.  Anti-infectives: Anti-infectives (From admission, onward)   Start     Dose/Rate Route Frequency Ordered Stop   06/30/18 1300  piperacillin-tazobactam (ZOSYN) IVPB 3.375 g     3.375 g 12.5 mL/hr over 240 Minutes Intravenous Every 8 hours 06/30/18 1242     06/29/18 0900  vancomycin (VANCOCIN) 1,500 mg in sodium chloride 0.9 % 500 mL IVPB  Status:  Discontinued     1,500 mg 250 mL/hr over 120 Minutes Intravenous Every 48 hours 06/29/18 0805 07/02/18 1516   06/28/18 1055  vancomycin variable dose per unstable renal function (pharmacist dosing)  Status:  Discontinued      Does not apply See admin instructions 06/28/18 1055 06/29/18 0805   06/27/18 2345  piperacillin-tazobactam (ZOSYN) IVPB 2.25 g  Status:  Discontinued     2.25 g 100 mL/hr over 30 Minutes Intravenous Every 8 hours 06/27/18 2336 06/30/18 1242   06/27/18 2315  vancomycin (VANCOCIN) IVPB 750 mg/150 ml premix  Status:  Discontinued    Note to Pharmacy:  Pharmacy to dose and monitor levels as appropriate for necrotizing  fascitis   750 mg 150 mL/hr over 60 Minutes Intravenous Every 12 hours 06/27/18 2303 06/27/18 2329   06/27/18 2315  clindamycin (CLEOCIN) IVPB 600 mg  Status:  Discontinued     600 mg 100 mL/hr over 30 Minutes Intravenous Every 8 hours 06/27/18 2309 07/02/18 1437   06/27/18 1215  vancomycin (VANCOCIN) 1,500 mg in sodium chloride 0.9 % 500 mL IVPB     1,500 mg 250 mL/hr over 120 Minutes Intravenous  Once 06/27/18 1142 06/27/18 1500   06/27/18 1100  metroNIDAZOLE (FLAGYL) IVPB 500 mg  Status:  Discontinued     500 mg 100 mL/hr over 60 Minutes Intravenous Every 8 hours 06/27/18 1052 06/27/18 2303   06/27/18 0200  cefTRIAXone (ROCEPHIN) 1 g in sodium chloride 0.9 % 100 mL IVPB  Status:   Discontinued     1 g 200 mL/hr over 30 Minutes Intravenous Every 24 hours 06/27/18 0138 06/27/18 2258       Assessment/Plan AKI on CKD stage III - Cr1.58,improving Acute onChronic normocytic anemia - H/H8.7/27.5, per primary service Enteritis - nonspecific finding on CT Hypothyroidism - home meds HTN - holding home meds for hypoTN Obesity - BMI 38.52 OSA Enterobacter cloacae UTI - on zosyn   Sepsis- likely 2/2below, improving L buttockFournier's Gangrene - S/P excisional debridement of L gluteal necrotizing soft tissue infection, Dr. Cliffton Asters, 07/31 - S/PDebridement of large sacral perineal and sacral wound, Dr. Luisa Hart, 08/02 - s/p diverting loop colostomy and SP tube placement 07/03/18 Dr. Derrell Lolling and Dr. Alvester Morin - WBC19.8,  - cxs show klebsiella pneumoniae, fungal cxs still pending - narrowing of abx per primary service - continue hydrotherapy  - BID dressing changes with prn changes when soiled.  FEN:HH diet, SLIV VTE: SCDs ID: vanc 7/31>8/5,cleocin7/31>8/5; zosyn 7/31>> Foley: present Follow up:TBD  DISPO:Will examine wound after hydrotherapy today. Advance diet. PT/OT. Continue current care.     LOS: 8 days    Wells Guiles , [facility] 07/04/2018, 7:50 AM Pager: [phone] Consults: [phone] Mon-Fri 7:00 am-4:30 pm Sat-Sun 7:00 am-11:30 am

## 2018-07-04 NOTE — Progress Notes (Addendum)
Nutrition Follow Up  DOCUMENTATION CODES:   Obesity unspecified  INTERVENTION:    Ensure Enlive po BID, each supplement provides 350 kcal and 20 grams of protein  Prostat liquid protein po 30 ml BID with meals, each supplement provides 100 kcal, 15 grams protein  NEW NUTRITION DIAGNOSIS:   Increased nutrient needs related to wound healing as evidenced by estimated needs, ongoing  GOAL:   Patient will meet greater than or equal to 90% of their needs, unmet  MONITOR:   PO intake, Supplement acceptance, Labs, Skin, Weight trends, I & O's  ASSESSMENT:  84 y.o. Female with HTN, hypothyroidism who presented with 1 week buttock pain, followed by malaise, diarrhea, and weakness.     surgical I&D's 7/31 and 8/2  Pt is s/p procedures 8/6: LAPAROSCOPIC DIVERTING LOOP COLOSTOMY   INSERTION OF SUPRAPUBIC CATHETER  Pt with several visitors and/or family members in her room. PO intake is rather poor at 25% per flowsheet records. Noted having some soreness and abdominal pain.  Pt not meeting protein needs. Will order nutrition supplements at this time. Also would benefit from addition of liquid protein (ie Pro-stat). CWOCN following. Receiving hydrotherapy per PT.  Labs and medications reviewed. Glucose, Bld 147 (H), 111 (H).  Diet Order:   Diet Order           Diet Heart Room service appropriate? Yes; Fluid consistency: Thin  Diet effective now         EDUCATION NEEDS:   Not appropriate for education at this time  Skin:  Skin Assessment: Skin Integrity Issues: Skin Integrity Issues:: Other (Comment) Other: L buttock wound  Last BM:  8/5  Height:   Ht Readings from Last 1 Encounters:  06/29/18 5\' 4"  (1.626 m)   Weight:   Wt Readings from Last 1 Encounters:  07/04/18 254 lb 3.1 oz (115.3 kg)   BMI:  Body mass index is 43.63 kg/m.  Estimated Nutritional Needs:   Kcal:  1800-1950  Protein:  90-105 gm  Fluid:  1.8-1.9 L  Maureen ChattersKatie Suede Greenawalt, RD, LDN Pager  #: [phone] After-Hours Pager #: [phone]

## 2018-07-04 NOTE — Progress Notes (Signed)
Physical Therapy Treatment Patient Details Name: [name] MRN: [id number] DOB: 05/07/1928 Today's Date: 07/04/2018    History of Present Illness Pt is an 84 y/o female with PMH significant for HTN, hypothyroidism who presents with 1 week buttock pain, followed by malaise, diarrhea, and weakness. In ED, pt found to be hypotensive with leukocytosis and lactic acidosis. Pt is now s/p 2 I&D of buttock wound and is undergoing hydrotherapy. Plan for lap colostomy 8/6.    PT Comments    Patient making big progress this session including standing and stepping at bedside.  Feel she has excellent help at home and would likely [facility]  PT to follow acutely.   Follow Up Recommendations  Supervision/Assistance - 24 hour;CIR     Equipment Recommendations  Other (comment)(TBA)    Recommendations for Other Services Rehab consult     Precautions / Restrictions Precautions Precautions: Fall Precaution Comments: colostomy, suprapubic catheter, large buttock wound Restrictions Weight Bearing Restrictions: (P) No    Mobility  Bed Mobility Overal bed mobility: Needs Assistance Bed Mobility: Sidelying to Sit;Sit to Sidelying   Sidelying to sit: Mod assist;+2 for physical assistance     Sit to sidelying: Mod assist;Max assist;+2 for physical assistance General bed mobility comments: assist for legs off bed and to lift trunk with pt pushing up with railing, to sidelying +2 for legs onto bed and to guide shoulders into side position  Transfers Overall transfer level: Needs assistance Equipment used: Rolling walker (2 wheeled) Transfers: Sit to/from Stand Sit to Stand: Mod assist;+2 physical assistance;From elevated surface         General transfer comment: lifting assist from slightly elevated bed; pt pulls up on walker, cues for technique, performed x 2 trials  Ambulation/Gait Ambulation/Gait assistance: Min assist;+2 physical  assistance Gait Distance (Feet): 2 Feet Assistive device: Rolling walker (2 wheeled) Gait Pattern/deviations: Step-to pattern;Decreased stride length;Trunk flexed     General Gait Details: [facility] steps to [facility] during first standing bout, then marching in place, noted R hip audible crepitus with standing and stepping activities   Stairs             Wheelchair Mobility    Modified Rankin (Stroke Patients Only)       Balance Overall balance assessment: Needs assistance Sitting-balance support: Bilateral upper extremity supported Sitting balance-Leahy Scale: Poor Sitting balance - Comments: leaning back initially in sitting with min A, then with one UE supported and S level assist   Standing balance support: Bilateral upper extremity supported Standing balance-Leahy Scale: Poor Standing balance comment: UE support and assist for balance                            Cognition Arousal/Alertness: Awake/alert Behavior During Therapy: WFL for tasks assessed/performed Overall Cognitive Status: Within Functional Limits for tasks assessed                                        Exercises      General Comments General comments (skin integrity, edema, etc.): daughter in room reports hopes to be able to take her home, but willing to do whatever the doctor says      Pertinent Vitals/Pain Pain Assessment: Faces Faces Pain Scale: Hurts little more Pain Location: abdomen from surgery Pain Descriptors / Indicators: Grimacing;Operative site guarding;Sore Pain Intervention(s):  Monitored during session;Repositioned    Home Living                      Prior Function            PT Goals (current goals can now be found in the care plan section) Progress towards PT goals: Progressing toward goals    Frequency    Min 3X/week      PT Plan Current plan remains appropriate    Co-evaluation              AM-PAC PT "6 Clicks" Daily  Activity  Outcome Measure  Difficulty turning over in bed (including adjusting bedclothes, sheets and blankets)?: Unable Difficulty moving from lying on back to sitting on the side of the bed? : Unable Difficulty sitting down on and standing up from a chair with arms (e.g., wheelchair, bedside commode, etc,.)?: Unable Help needed moving to and from a bed to chair (including a wheelchair)?: A Lot Help needed walking in hospital room?: A Lot Help needed climbing 3-5 steps with a railing? : Total 6 Click Score: 8    End of Session Equipment Utilized During Treatment: Gait belt Activity Tolerance: Patient tolerated treatment well Patient left: in bed;with call bell/phone within reach;with family/visitor present Nurse Communication: Other (comment)(may need meds in prep for hydro) PT Visit Diagnosis: Unsteadiness on feet (R26.81);Pain;Muscle weakness (generalized) (M62.81) Pain - Right/Left: Right Pain - part of body: Hip     Time: 1610-9604 PT Time Calculation (min) (ACUTE ONLY): 25 min  Charges:  $Gait Training: 8-22 mins $Therapeutic Activity: 8-22 mins                     [name], [name] [phone] 07/04/2018    [name] 07/04/2018, 10:25 AM

## 2018-07-04 NOTE — Progress Notes (Signed)
Physical Therapy Wound Treatment Patient Details  Name: [name] MRN: [id number] Date of Birth: 10/23/1928  Today's Date: 07/04/2018 Time: 3428-7681 Time Calculation (min): 57 min  Subjective  Subjective: Pt pleasant and agreeable to hydrotherapy this morning Patient and Family Stated Goals: Heal wound per pt and family member present Date of Onset: (Unsure) Prior Treatments: Surgical debridement 06/27/18 and 06/29/18. On 8/6 pt underwent diverting colostomy and SP catheter placement.  Pain Score: Pain Score: 7   Wound Assessment  Pressure Injury 06/27/18 Unstageable - Full thickness tissue loss in which the base of the ulcer is covered by slough (yellow, tan, gray, green or brown) and/or eschar (tan, brown or black) in the wound bed. Three areas of black eschar surrounded by blan (Active)  Wound Image   07/02/2018 12:00 PM  Dressing Type Moist to dry;Gauze (Comment);Foam 07/04/2018  1:21 PM  Dressing Changed;Clean;Dry;Intact 07/04/2018  1:21 PM  Dressing Change Frequency Daily 07/04/2018  1:21 PM  State of Healing Non-healing 07/04/2018  1:21 PM  Site / Wound Assessment Pink;Red;Yellow 07/04/2018  1:21 PM  % Wound base Red or Granulating 65% 07/04/2018  1:21 PM  % Wound base Yellow/Fibrinous Exudate 35% 07/04/2018  1:21 PM  % Wound base Black/Eschar 0% 07/04/2018  1:21 PM  % Wound base Other/Granulation Tissue (Comment) 0% 07/04/2018  1:21 PM  Peri-wound Assessment Intact;Erythema (blanchable) 07/04/2018  1:21 PM  Wound Length (cm) 16.2 cm 07/02/2018 12:00 PM  Wound Width (cm) 15 cm 07/02/2018 12:00 PM  Wound Depth (cm) 5 cm 07/02/2018 12:00 PM  Wound Surface Area (cm^2) 243 cm^2 07/02/2018 12:00 PM  Wound Volume (cm^3) 1215 cm^3 07/02/2018 12:00 PM  Tunneling (cm) 5.2 in center of wound 07/02/2018 12:00 PM  Undermining (cm) 2.1 cm 6-9:00, 3.9 cm 9-10:00, 3 cm 10-12:00 07/02/2018 12:00 PM  Margins Unattached edges (unapproximated) 07/04/2018  1:21 PM  Drainage Amount Moderate 07/04/2018  1:21 PM  Drainage Description  Serosanguineous 07/04/2018  1:21 PM  Treatment Debridement (Selective);Hydrotherapy (Pulse lavage);Packing (Saline gauze) 07/04/2018  1:21 PM   Santyl applied to wound bed prior to applying dressing.     Hydrotherapy Pulsed lavage therapy - wound location: Buttock Pulsed Lavage with Suction (psi): 12 psi Pulsed Lavage with Suction - Normal Saline Used: 1000 mL Pulsed Lavage Tip: Tip with splash shield Selective Debridement Selective Debridement - Location: Buttock Selective Debridement - Tools Used: Forceps;Scissors Selective Debridement - Tissue Removed: Yellow and black necrotic tissue   Wound Assessment and Plan  Wound Therapy - Assess/Plan/Recommendations Wound Therapy - Clinical Statement: Overall appearence of wound continues to improve. Noted skin tears and irritation along lateral side of wound where tape has been placed. Discussed with PA and will continue on with foam dressings and briefs (family to bring from home) to wear durinig mobility to decrease risk of further skin breakdown. This patient will benefit from continued hydrotherapy for selective removal of unviable tissue, to decrease bioburden and promote wound bed healing.  Wound Therapy - Functional Problem List: Decreased tolerance for OOB mobility/position changes Factors Delaying/Impairing Wound Healing: Immobility;Multiple medical problems;Altered sensation Hydrotherapy Plan: Debridement;Dressing change;Patient/family education;Pulsatile lavage with suction Wound Therapy - Frequency: 6X / week Wound Therapy - Follow Up Recommendations: Skilled nursing facility Wound Plan: See above  Wound Therapy Goals- Improve the function of patient's integumentary system by progressing the wound(s) through the phases of wound healing (inflammation - proliferation - remodeling) by: Decrease Necrotic Tissue to: 20% Decrease Necrotic Tissue - Progress: Progressing toward goal Increase Granulation Tissue to: 80% Increase  Granulation  Tissue - Progress: Progressing toward goal Goals/treatment plan/discharge plan were made with and agreed upon by patient/family: Yes Time For Goal Achievement: 7 days Wound Therapy - Potential for Goals: Good  Goals will be updated until maximal potential achieved or discharge criteria met.  Discharge criteria: when goals achieved, discharge from hospital, MD decision/surgical intervention, no progress towards goals, refusal/missing three consecutive treatments without notification or medical reason.  GP     Thelma Comp 07/04/2018, 1:35 PM   Rolinda Roan, PT, DPT Acute Rehabilitation Services Pager: [phone]

## 2018-07-05 LAB — CBC
HCT: 25.2 % — ABNORMAL LOW (ref 36.0–46.0)
Hemoglobin: 8.1 g/dL — ABNORMAL LOW (ref 12.0–15.0)
MCH: 30.7 pg (ref 26.0–34.0)
MCHC: 32.1 g/dL (ref 30.0–36.0)
MCV: 95.5 fL (ref 78.0–100.0)
PLATELETS: 125 10*3/uL — AB (ref 150–400)
RBC: 2.64 MIL/uL — AB (ref 3.87–5.11)
RDW: 15.9 % — ABNORMAL HIGH (ref 11.5–15.5)
WBC: 16.9 10*3/uL — ABNORMAL HIGH (ref 4.0–10.5)

## 2018-07-05 LAB — BASIC METABOLIC PANEL
ANION GAP: 9 (ref 5–15)
BUN: 34 mg/dL — ABNORMAL HIGH (ref 8–23)
CO2: 17 mmol/L — ABNORMAL LOW (ref 22–32)
Calcium: 8.1 mg/dL — ABNORMAL LOW (ref 8.9–10.3)
Chloride: 116 mmol/L — ABNORMAL HIGH (ref 98–111)
Creatinine, Ser: 1.48 mg/dL — ABNORMAL HIGH (ref 0.44–1.00)
GFR calc Af Amer: 35 mL/min — ABNORMAL LOW (ref >60)
GFR, EST NON AFRICAN AMERICAN: 30 mL/min — AB (ref >60)
Glucose, Bld: 110 mg/dL — ABNORMAL HIGH (ref 70–99)
POTASSIUM: 4 mmol/L (ref 3.5–5.1)
SODIUM: 142 mmol/L (ref 135–145)

## 2018-07-05 NOTE — Progress Notes (Signed)
Inpatient Rehabilitation-Admissions Coordinator    Met with patient and her two daughters at the bedside to discuss team's recommendation for inpatient rehabilitation. Shared booklets, expectations while in CIR, expected length of stay, and anticipated functional level at DC. Family very supportive and wanting to pursue CIR at this time.   Insurance authorization requires clinical updates from at least two disciplines with recommendation for placement of OT order [facility] contacted MD regarding request for OT order). Once clinicals updated, AC will begin insurance authorization process. AC will follow for medical readiness and tolerance with therapy.   Please call if questions.   Jhonnie Garner, OTR/L  Rehab Admissions Coordinator  [phone] 07/05/2018 5:03 PM

## 2018-07-05 NOTE — Progress Notes (Signed)
2 Days Post-Op   Subjective/Chief Complaint: Pt doing well Tol Po Ostomy producing   Objective: Vital signs in last 24 hours: Temp:  [97.9 F (36.6 C)-98.4 F (36.9 C)] 97.9 F (36.6 C) (08/08 0441) Pulse Rate:  [57-64] 60 (08/08 0441) Resp:  [19-27] 22 (08/08 0441) BP: (110-135)/(55-80) 123/58 (08/08 0441) SpO2:  [92 %-99 %] 92 % (08/08 0441) Last BM Date: 07/04/18  Intake/Output from previous day: 08/07 0701 - 08/08 0700 In: 84.5 [IV Piggyback:84.5] Out: 1100 [Urine:1100] Intake/Output this shift: No intake/output data recorded.  General appearance: alert and cooperative GI: soft, non-tender; bowel sounds normal; no masses,  no organomegaly and ostomy, pink/patent  Lab Results:  Recent Labs    07/04/18 0318 07/05/18 0628  WBC 19.8* 16.9*  HGB 8.7* 8.1*  HCT 27.5* 25.2*  PLT 128* 125*   BMET Recent Labs    07/04/18 0318 07/05/18 0628  NA 142 142  K 4.2 4.0  CL 113* 116*  CO2 15* 17*  GLUCOSE 147* 110*  BUN 38* 34*  CREATININE 1.58* 1.48*  CALCIUM 8.1* 8.1*   PT/INR No results for input(s): LABPROT, INR in the last 72 hours. ABG No results for input(s): PHART, HCO3 in the last 72 hours.  Invalid input(s): PCO2, PO2  Studies/Results: No results found.  Anti-infectives: Anti-infectives (From admission, onward)   Start     Dose/Rate Route Frequency Ordered Stop   07/04/18 1400  amoxicillin-clavulanate (AUGMENTIN) 875-125 MG per tablet 1 tablet     1 tablet Oral Every 12 hours 07/04/18 1332     07/04/18 1300  cephALEXin (KEFLEX) capsule 500 mg  Status:  Discontinued     500 mg Oral Every 12 hours 07/04/18 1212 07/04/18 1332   06/30/18 1300  piperacillin-tazobactam (ZOSYN) IVPB 3.375 g  Status:  Discontinued     3.375 g 12.5 mL/hr over 240 Minutes Intravenous Every 8 hours 06/30/18 1242 07/04/18 1212   06/29/18 0900  vancomycin (VANCOCIN) 1,500 mg in sodium chloride 0.9 % 500 mL IVPB  Status:  Discontinued     1,500 mg 250 mL/hr over 120 Minutes  Intravenous Every 48 hours 06/29/18 0805 07/02/18 1516   06/28/18 1055  vancomycin variable dose per unstable renal function (pharmacist dosing)  Status:  Discontinued      Does not apply See admin instructions 06/28/18 1055 06/29/18 0805   06/27/18 2345  piperacillin-tazobactam (ZOSYN) IVPB 2.25 g  Status:  Discontinued     2.25 g 100 mL/hr over 30 Minutes Intravenous Every 8 hours 06/27/18 2336 06/30/18 1242   06/27/18 2315  vancomycin (VANCOCIN) IVPB 750 mg/150 ml premix  Status:  Discontinued    Note to Pharmacy:  Pharmacy to dose and monitor levels as appropriate for necrotizing fascitis   750 mg 150 mL/hr over 60 Minutes Intravenous Every 12 hours 06/27/18 2303 06/27/18 2329   06/27/18 2315  clindamycin (CLEOCIN) IVPB 600 mg  Status:  Discontinued     600 mg 100 mL/hr over 30 Minutes Intravenous Every 8 hours 06/27/18 2309 07/02/18 1437   06/27/18 1215  vancomycin (VANCOCIN) 1,500 mg in sodium chloride 0.9 % 500 mL IVPB     1,500 mg 250 mL/hr over 120 Minutes Intravenous  Once 06/27/18 1142 06/27/18 1500   06/27/18 1100  metroNIDAZOLE (FLAGYL) IVPB 500 mg  Status:  Discontinued     500 mg 100 mL/hr over 60 Minutes Intravenous Every 8 hours 06/27/18 1052 06/27/18 2303   06/27/18 0200  cefTRIAXone (ROCEPHIN) 1 g in sodium chloride 0.9 %  100 mL IVPB  Status:  Discontinued     1 g 200 mL/hr over 30 Minutes Intravenous Every 24 hours 06/27/18 0138 06/27/18 2258      Assessment/Plan: s/p Procedure(s): LAPAROSCOPIC DIVERTED COLOSTOMY (N/A) INSERTION OF SUPRAPUBIC CATHETER (N/A) con't to mobilize with PT Ostomy education hydrotx  LOS: 9 days    Marigene Ehlersamirez Jr., Jed Limerickrmando 07/05/2018

## 2018-07-05 NOTE — Progress Notes (Addendum)
[facility]  PROGRESS NOTE  [name] BJY:[id number] DOB: 07/22/1928 DOA: 06/26/2018 PCP: Kirby Funk, MD   Brief HPI:   84 year old female with a history of hypothyroidism came with 1 week history of buttock pain, malaise, diarrhea and weakness.  She went to urgent care and she was told that she was dehydrated.  She came to the ER was found to have BP 80/40, leukocytosis, creatinine 5.5, lactic acidosis.  Patient improved after she was given fluid boluses.  Started on ceftriaxone for presumed UTI.  CT abdomen showed enteritis, buttock cellulitis.  Examination of buttock showed cellulitis with necrosis.  Patient admitted for sepsis/Fournier's gangrene.  Started on empiric antibiotics.  Surgery was consulted for debridement.    Subjective   Patient seen and examined, denies any pain.  CIR has been consulted   Assessment/Plan:     1. Sepsis from Fournier's gangrene-resolved, status post debridement of left gluteal necrotizing soft tissue infection by Dr. Cliffton Asters on 06/27/2018.  Status post debridement of large sacral, perineal wound by Dr. Luisa Hart on 06/29/2018, status post diverting colostomy placement and status post catheter placement 07/03/2018.  Wound culture grew Klebsiella pneumoniae sensitive to cefazolin.  Patient was started on Augmentin yesterday.  WBC is down to 16.9.  IV Zosyn was discontinued.  Follow CBC in a.m.  Blood cultures x2 remain negative to date.  Fungus culture is still pending.  Continue hydrotherapy, dressing changes as per surgery recommendations. 2. Acute kidney injury-patient's baseline creatinine is 1.4-1.5, she came in with creatinine of 5.5.  Creatinine is slowly improving.  Home Lasix is currently on hold.  Today creatinine is 1.48.  Almost at baseline.  Follow BMP in am. 3. Chronic normocytic anemia-patient's baseline hemoglobin is around 10-11.  FOBT negative.  There was low suspicion for GI bleed, gastroenterology was consulted for enteritis but at that  time patient was found to have buttock cellulitis and abscess so GI signed off.  Hemoglobin stable at 8.1x100 actually.  Closely monitor patient's globin and transfuse for hemoglobin less than 7. 4. Hypothyroidism-continue Synthroid 5. History of hypertension-patient was hypotensive admission.  Currently blood pressure remains stable.  Currently Lasix and ARB on hold 6. Enteritis-nonspecific finding seen on CT scan. No diarrhea,  monitor stools.    DVT prophylaxis: SCDs  Code Status: DNR  Family Communication: Discussed with patient's family at bedside  Disposition Plan: likely home vs CIR   Consultants:  GI  Nephrology  General surgery  Procedures:  None   Antibiotics:   Anti-infectives (From admission, onward)   Start     Dose/Rate Route Frequency Ordered Stop   07/04/18 1400  amoxicillin-clavulanate (AUGMENTIN) 875-125 MG per tablet 1 tablet     1 tablet Oral Every 12 hours 07/04/18 1332     07/04/18 1300  cephALEXin (KEFLEX) capsule 500 mg  Status:  Discontinued     500 mg Oral Every 12 hours 07/04/18 1212 07/04/18 1332   06/30/18 1300  piperacillin-tazobactam (ZOSYN) IVPB 3.375 g  Status:  Discontinued     3.375 g 12.5 mL/hr over 240 Minutes Intravenous Every 8 hours 06/30/18 1242 07/04/18 1212   06/29/18 0900  vancomycin (VANCOCIN) 1,500 mg in sodium chloride 0.9 % 500 mL IVPB  Status:  Discontinued     1,500 mg 250 mL/hr over 120 Minutes Intravenous Every 48 hours 06/29/18 0805 07/02/18 1516   06/28/18 1055  vancomycin variable dose per unstable renal function (pharmacist dosing)  Status:  Discontinued      Does not apply See  admin instructions 06/28/18 1055 06/29/18 0805   06/27/18 2345  piperacillin-tazobactam (ZOSYN) IVPB 2.25 g  Status:  Discontinued     2.25 g 100 mL/hr over 30 Minutes Intravenous Every 8 hours 06/27/18 2336 06/30/18 1242   06/27/18 2315  vancomycin (VANCOCIN) IVPB 750 mg/150 ml premix  Status:  Discontinued    Note to Pharmacy:  Pharmacy  to dose and monitor levels as appropriate for necrotizing fascitis   750 mg 150 mL/hr over 60 Minutes Intravenous Every 12 hours 06/27/18 2303 06/27/18 2329   06/27/18 2315  clindamycin (CLEOCIN) IVPB 600 mg  Status:  Discontinued     600 mg 100 mL/hr over 30 Minutes Intravenous Every 8 hours 06/27/18 2309 07/02/18 1437   06/27/18 1215  vancomycin (VANCOCIN) 1,500 mg in sodium chloride 0.9 % 500 mL IVPB     1,500 mg 250 mL/hr over 120 Minutes Intravenous  Once 06/27/18 1142 06/27/18 1500   06/27/18 1100  metroNIDAZOLE (FLAGYL) IVPB 500 mg  Status:  Discontinued     500 mg 100 mL/hr over 60 Minutes Intravenous Every 8 hours 06/27/18 1052 06/27/18 2303   06/27/18 0200  cefTRIAXone (ROCEPHIN) 1 g in sodium chloride 0.9 % 100 mL IVPB  Status:  Discontinued     1 g 200 mL/hr over 30 Minutes Intravenous Every 24 hours 06/27/18 0138 06/27/18 2258       Objective   Vitals:   07/04/18 2055 07/05/18 0011 07/05/18 0441 07/05/18 0848  BP: 110/67 (!) 135/55 (!) 123/58 (!) 142/52  Pulse: 62 64 60 (!) 58  Resp: (!) 27 (!) 21 (!) 22 (!) 24  Temp: 97.9 F (36.6 C) 98.4 F (36.9 C) 97.9 F (36.6 C) 98.1 F (36.7 C)  TempSrc: Oral Oral Oral Oral  SpO2: 95% 94% 92% 95%  Weight:      Height:        Intake/Output Summary (Last 24 hours) at 07/05/2018 1115 Last data filed at 07/04/2018 2120 Gross per 24 hour  Intake 84.52 ml  Output 500 ml  Net -415.48 ml   Filed Weights   07/02/18 0501 07/03/18 0610 07/04/18 0500  Weight: 111.9 kg 115.7 kg 115.3 kg     Physical Examination:    Neck: Supple, no deformities, masses, or tenderness Lungs: Normal respiratory effort, bilateral clear to auscultation, no crackles or wheezes.  Heart: Regular rate and rhythm, S1 and S2 normal, no murmurs, rubs auscultated Abdomen: BS normoactive,soft,nondistended,non-tender to palpation,no organomegaly Extremities: B/L  1+ edema of lower extremities. no erythema, no cyanosis, no clubbing Neuro : Alert and  oriented to time, place and person, No focal deficits      Data Reviewed: I have personally reviewed following labs and imaging studies  CBG: Recent Labs  Lab 06/29/18 0936  GLUCAP 119*    CBC: Recent Labs  Lab 07/02/18 0356 07/03/18 0447 07/03/18 1557 07/04/18 0318 07/05/18 0628  WBC 21.4* 20.4* 26.0* 19.8* 16.9*  HGB 8.2* 8.4* 9.4* 8.7* 8.1*  HCT 25.2* 26.0* 30.1* 27.5* 25.2*  MCV 94.7 94.5 97.4 96.2 95.5  PLT 109* 111* 125* 128* 125*    Basic Metabolic Panel: Recent Labs  Lab 06/29/18 0432  07/01/18 0311 07/02/18 0356 07/03/18 0447 07/04/18 0318 07/05/18 0628  NA 145   < > 143 143 143 142 142  K 3.7   < > 4.8 3.9 3.9 4.2 4.0  CL 114*   < > 117* 117* 115* 113* 116*  CO2 14*   < > 19* 16* 16* 15* 17*  GLUCOSE 131*   < > 108* 103* 111* 147* 110*  BUN 81*   < > 72* 59* 47* 38* 34*  CREATININE 2.62*   < > 1.91* 1.76* 1.65* 1.58* 1.48*  CALCIUM 8.0*   < > 8.0* 8.1* 8.1* 8.1* 8.1*  MG 2.1  --   --   --   --   --   --   PHOS 5.1*  --   --   --   --   --   --    < > = values in this interval not displayed.    Recent Results (from the past 240 hour(s))  Urine Culture     Status: Abnormal   Collection Time: 06/27/18 12:43 AM  Result Value Ref Range Status   Specimen Description URINE, RANDOM  Final   Special Requests   Final    NONE Performed at [facility], [address] [place], [place] [address]    Culture >=100,000 COLONIES/mL ENTEROBACTER CLOACAE (A)  Final   Report Status 06/29/2018 FINAL  Final   Organism ID, Bacteria ENTEROBACTER CLOACAE (A)  Final      Susceptibility   Enterobacter cloacae - MIC*    CEFAZOLIN >=64 RESISTANT Resistant     CEFTRIAXONE 2 SENSITIVE Sensitive     CIPROFLOXACIN <=0.25 SENSITIVE Sensitive     GENTAMICIN <=1 SENSITIVE Sensitive     IMIPENEM <=0.25 SENSITIVE Sensitive     NITROFURANTOIN 64 INTERMEDIATE Intermediate     TRIMETH/SULFA <=20 SENSITIVE Sensitive     PIP/TAZO 8 SENSITIVE Sensitive     * >=100,000  COLONIES/mL ENTEROBACTER CLOACAE  MRSA PCR Screening     Status: None   Collection Time: 06/27/18  3:16 AM  Result Value Ref Range Status   MRSA by PCR NEGATIVE NEGATIVE Final    Comment:        The GeneXpert MRSA Assay (FDA approved for NASAL specimens only), is one component of a comprehensive MRSA colonization surveillance program. It is not intended to diagnose MRSA infection nor to guide or monitor treatment for MRSA infections. Performed at [facility], [address] [place], [place] [address]   Culture, blood (Routine X 2) w Reflex to ID Panel     Status: None   Collection Time: 06/27/18  4:05 AM  Result Value Ref Range Status   Specimen Description BLOOD LEFT ANTECUBITAL  Final   Special Requests IN PEDIATRIC BOTTLE Blood Culture adequate volume  Final   Culture   Final    NO GROWTH 5 DAYS Performed at [facility], [address] [place], [place] [address]    Report Status 07/02/2018 FINAL  Final  Culture, blood (Routine X 2) w Reflex to ID Panel     Status: None   Collection Time: 06/27/18  4:20 AM  Result Value Ref Range Status   Specimen Description BLOOD LEFT HAND  Final   Special Requests IN PEDIATRIC BOTTLE Blood Culture adequate volume  Final   Culture   Final    NO GROWTH 5 DAYS Performed at [facility], [address] [place], [place] [address]    Report Status 07/02/2018 FINAL  Final  Fungus Culture With Stain     Status: None (Preliminary result)   Collection Time: 06/27/18  6:01 PM  Result Value Ref Range Status   Fungus Stain Final report  Final    Comment: (NOTE) Performed At: [facility] [address] [place], [place] [id number] Clovis RileyNagendra  Claudie Fisherman MD VW:[id number]    Fungus (Mycology) Culture PENDING  Incomplete   Fungal Source ABSCESS  Final    Comment: LEFT BUTTOCKS ON VANC Performed at [facility], [address] [place], [place] [address]   Aerobic/Anaerobic Culture (surgical/deep wound)     Status:  None   Collection Time: 06/27/18  6:01 PM  Result Value Ref Range Status   Specimen Description ABSCESS  Final   Special Requests NONE  Final   Gram Stain   Final    NO WBC SEEN MODERATE GRAM POSITIVE COCCI IN PAIRS MODERATE GRAM NEGATIVE COCCOBACILLI Performed at [facility], [address], [place], [place] [address]    Culture   Final    MODERATE BACTEROIDES SPECIES BETA LACTAMASE POSITIVE RARE KLEBSIELLA PNEUMONIAE    Report Status 07/01/2018 FINAL  Final   Organism ID, Bacteria KLEBSIELLA PNEUMONIAE  Final      Susceptibility   Klebsiella pneumoniae - MIC*    AMPICILLIN RESISTANT Resistant     CEFAZOLIN <=4 SENSITIVE Sensitive     CEFEPIME <=1 SENSITIVE Sensitive     CEFTAZIDIME <=1 SENSITIVE Sensitive     CEFTRIAXONE <=1 SENSITIVE Sensitive     CIPROFLOXACIN <=0.25 SENSITIVE Sensitive     GENTAMICIN <=1 SENSITIVE Sensitive     IMIPENEM <=0.25 SENSITIVE Sensitive     TRIMETH/SULFA <=20 SENSITIVE Sensitive     AMPICILLIN/SULBACTAM <=2 SENSITIVE Sensitive     PIP/TAZO <=4 SENSITIVE Sensitive     Extended ESBL NEGATIVE Sensitive     * RARE KLEBSIELLA PNEUMONIAE  Fungus Culture Result     Status: None   Collection Time: 06/27/18  6:01 PM  Result Value Ref Range Status   Result 1 Comment  Final    Comment: (NOTE) KOH/Calcofluor preparation:  no fungus observed. Performed At: [facility] [address] [place], [place] [id number] Jolene Schimke MD BM:[id number]   Surgical pcr screen     Status: None   Collection Time: 06/29/18  8:56 AM  Result Value Ref Range Status   MRSA, PCR NEGATIVE NEGATIVE Final   Staphylococcus aureus NEGATIVE NEGATIVE Final    Comment: (NOTE) The Xpert SA Assay (FDA approved for NASAL specimens in patients 15 years of age and older), is one component of a comprehensive surveillance program. It is not intended to diagnose infection nor to guide or monitor treatment. Performed at [facility], [address],  [place], [place] [address]      Liver Function Tests: No results for input(s): AST, ALT, ALKPHOS, BILITOT, PROT, ALBUMIN in the last 168 hours. No results for input(s): LIPASE, AMYLASE in the last 168 hours. No results for input(s): AMMONIA in the last 168 hours.  Cardiac Enzymes: No results for input(s): CKTOTAL, CKMB, CKMBINDEX, TROPONINI in the last 168 hours. BNP (last 3 results) No results for input(s): BNP in the last 8760 hours.  ProBNP (last 3 results) No results for input(s): PROBNP in the last 8760 hours.    Studies: No results found.  Scheduled Meds: . allopurinol  100 mg Oral Daily  . amoxicillin-clavulanate  1 tablet Oral Q12H  . collagenase   Topical BID  . enoxaparin (LOVENOX) injection  40 mg Subcutaneous Q24H  . feeding supplement (ENSURE ENLIVE)  237 mL Oral BID BM  . feeding supplement (PRO-STAT SUGAR FREE 64)  30 mL Oral BID  . levothyroxine  137 mcg Oral QAC breakfast  . sertraline  50 mg Oral Daily      Time  spent: 25 min  Meredeth Ide   [facility] Pager [phone]. If 7PM-7AM, please contact night-coverage at [email or web address], Office  [phone]  password [email or web address]  07/05/2018, 11:15 AM  LOS: 9 days

## 2018-07-05 NOTE — Progress Notes (Signed)
Physical Therapy Wound Treatment Patient Details  Name: [name] MRN: [id number] Date of Birth: 19-May-1928  Today's Date: 07/05/2018 Time:  -     Subjective  Subjective: Pt pleasant and agreeable to hydrotherapy this morning Patient and Family Stated Goals: Heal wound per pt and family member present Date of Onset: (Unsure) Prior Treatments: Surgical debridement 06/27/18 and 06/29/18. On 8/6 pt underwent diverting colostomy and SP catheter placement.  Pain Score:    Wound Assessment  Pressure Injury 06/27/18 Unstageable - Full thickness tissue loss in which the base of the ulcer is covered by slough (yellow, tan, gray, green or brown) and/or eschar (tan, brown or black) in the wound bed. Three areas of black eschar surrounded by blan (Active)  Wound Image   07/05/2018  1:35 PM  Dressing Type Moist to dry;Gauze (Comment);Foam 07/05/2018  1:35 PM  Dressing Changed;Clean;Dry;Intact 07/05/2018  1:35 PM  Dressing Change Frequency Daily 07/05/2018  1:35 PM  State of Healing Non-healing 07/05/2018  1:35 PM  Site / Wound Assessment Pink;Red;Yellow 07/05/2018  1:35 PM  % Wound base Red or Granulating 75% 07/05/2018  1:35 PM  % Wound base Yellow/Fibrinous Exudate 25% 07/05/2018  1:35 PM  % Wound base Black/Eschar 0% 07/05/2018  1:35 PM  % Wound base Other/Granulation Tissue (Comment) 0% 07/05/2018  1:35 PM  Peri-wound Assessment Intact;Erythema (blanchable) 07/05/2018  1:35 PM  Wound Length (cm) 16.2 cm 07/02/2018 12:00 PM  Wound Width (cm) 15 cm 07/02/2018 12:00 PM  Wound Depth (cm) 5 cm 07/02/2018 12:00 PM  Wound Surface Area (cm^2) 243 cm^2 07/02/2018 12:00 PM  Wound Volume (cm^3) 1215 cm^3 07/02/2018 12:00 PM  Tunneling (cm) 5.2 in center of wound 07/02/2018 12:00 PM  Undermining (cm) 2.1 cm 6-9:00, 3.9 cm 9-10:00, 3 cm 10-12:00 07/02/2018 12:00 PM  Margins Unattached edges (unapproximated) 07/05/2018  1:35 PM  Drainage Amount Moderate 07/05/2018  1:35 PM  Drainage Description Serosanguineous 07/05/2018  1:35 PM  Treatment  Debridement (Selective);Hydrotherapy (Pulse lavage);Packing (Saline gauze) 07/05/2018  1:35 PM   Santyl applied to wound bed prior to applying dressing.    Hydrotherapy Pulsed lavage therapy - wound location: Buttock Pulsed Lavage with Suction (psi): 12 psi Pulsed Lavage with Suction - Normal Saline Used: 1000 mL Pulsed Lavage Tip: Tip with splash shield Selective Debridement Selective Debridement - Location: Buttock Selective Debridement - Tools Used: Forceps;Scissors Selective Debridement - Tissue Removed: Yellow necrotic tissue   Wound Assessment and Plan  Wound Therapy - Assess/Plan/Recommendations Wound Therapy - Clinical Statement: Overall appearance of wound continues to improve. Noted an area with multiple blisters superior to wound (appeared to have a purulent discharge to them), and worsening redness/irritation in the periwound area, as well as skin tears previously noted when taking the foam dressing off. Picture provided for reference. Discussed with RN and charge RN need to keep pt dry with clean foam dressing between hydro sessions. May want to consider [place] Straps to minimize adhesive on skin. This patient will benefit from continued hydrotherapy for selective removal of unviable tissue, to decrease bioburden and promote wound bed healing. Wound Therapy - Functional Problem List: Decreased tolerance for OOB mobility/position changes Factors Delaying/Impairing Wound Healing: Immobility;Multiple medical problems;Altered sensation Hydrotherapy Plan: Debridement;Dressing change;Patient/family education;Pulsatile lavage with suction Wound Therapy - Frequency: 6X / week Wound Therapy - Follow Up Recommendations: Skilled nursing facility Wound Plan: See above  Wound Therapy Goals- Improve the function of patient's integumentary system by progressing the wound(s) through the phases of wound healing (inflammation - proliferation -  remodeling) by: Decrease Necrotic Tissue to:  20% Decrease Necrotic Tissue - Progress: Progressing toward goal Increase Granulation Tissue to: 80% Increase Granulation Tissue - Progress: Progressing toward goal Goals/treatment plan/discharge plan were made with and agreed upon by patient/family: Yes Time For Goal Achievement: 7 days Wound Therapy - Potential for Goals: Good  Goals will be updated until maximal potential achieved or discharge criteria met.  Discharge criteria: when goals achieved, discharge from hospital, MD decision/surgical intervention, no progress towards goals, refusal/missing three consecutive treatments without notification or medical reason.  GP     Thelma Comp 07/05/2018, 1:51 PM   Rolinda Roan, PT, DPT Acute Rehabilitation Services Pager: [phone]

## 2018-07-05 NOTE — PMR Pre-admission (Addendum)
PMR Admission Coordinator Pre-Admission Assessment  Patient: [name] is an 84 y.o., female MRN: [id number] DOB: 12/09/1927 Height: 5' 4"  (162.6 cm) Weight: 104.6 kg              Insurance Information HMO: Yes    PPO:      PCP:      IPA:      80/20:      OTHER: (Plan Type: AARP Med Complete Plan 1) PRIMARY: UHC Medicare      Policy#: [id number]      Subscriber: Patient CM Name: Vevelyn Royals       Phone#: [phone]     Fax#: [phone] Auth received from [facility] for admit date 07/11/18; Clinical updates due to Vevelyn Royals on day 7 (3/97/67) Pre-Cert#: [id number]      Employer:  Benefits:  Phone #: NA     Name: [email or web address] Eff. Date: 11/28/17     Deduct: $0      Out of Pocket Max: $4,400 (Met 154.15)      Life Max: NA CIR: $345/day for days 1-5; $0/day for days 6+      SNF: $0/day for days 1-20, $160/day for days 21-48, $0/day for days 49-100 Outpatient: per necessity     Co-Pay: $40/visit  Home Health: 100%      Co-Pay:  DME: 80%     Co-Pay: 20% Providers:   Medicaid Application Date:       Case Manager:  Disability Application Date:       Case Worker:   Emergency Contact Information Contact Information    Name Relation Home Work Mobile   Elmore Daughter [phone]     Lewis,Brenda Daughter [phone]       Current Medical History  Patient Admitting Diagnosis: Debility  History of Present Illness:  [name] is an 84 year old right-handed female with history of hypo-thyroidism, OSA on CPAP, morbid obesity.  Per chart review patient lives with her grandson in [place].  Used a cane prior to admission due to end-stage osteoarthritis of the hip.  She has multiple family members with good support.  Presented 06/26/2018 with diarrhea with black tarry stool and abdominal pain as well as severe hypotension.  Patient noted poor appetite for the past few days.  WBC 16,100, creatinine 5.6, hemoglobin 10.7, fecal occult blood positive, troponin  negative, lactic acid 2.96.  Echocardiogram with ejection fraction of 22% grade 2 diastolic dysfunction.  CT of abdomen and pelvis showed focal hazy inflammatory stranding adjacent to a loop of small bowel within the left abdomen suspicious for acute enteritis.  No associated obstruction.  Sigmoid diverticulosis without evidence for acute diverticulitis.  7 mm nodule left lung base increased in prominence as compared to 2012.WOC follow-up for 3 dark and necrotic areas present on the left buttocks with surrounding erythema consistent with cellulitis extending all the way to her left labia.  Underwent excisional debridement of left gluteal necrotizing soft tissue infection 06/27/2018 per Dr. Dema Severin followed by debridement of large sacral perineal and sacral wound 06/29/2018 per Dr. Brantley Stage.  Urology consulted 07/01/2018 due to ongoing urinary incontinence which slowly progressed and later underwent suprapubic tube placement 07/03/2018 per Dr. Gloriann Loan as well as laparoscopic diverting loop colostomy 07/03/2018 per Dr. Rosendo Gros.  There were some issues of urine leaking around suprapubic tube with urology to follow-up on question need to change suprapubic.  Nephrology service is consulted in regards to severe AKI felt to be related to hypotension responding to IV  fluids.  No current plan for diagnostic GI work-up noted.  Hospital course pain management.  Urine culture 06/27/2018 showed Enterobacter sensitive to Zosyn later changed to Augmentin x10-day course and completed..  Receiving hydrotherapy for significant sacral wounds.  Subcutaneous Lovenox for DVT prophylaxis.  Physical and occupational therapy evaluations completed with recommendations of physical medicine rehab consult.  Patient is to be admitted for a comprehensive rehab program on 07/11/18.      Past Medical History  Past Medical History:  Diagnosis Date  . Chest pain   . Depression   . GERD (gastroesophageal reflux disease)   . Hypertension   . Sleep apnea   .  Thyroid disease     Family History  family history includes Cancer in her brother; Diabetes in her mother; Kidney disease in her sister; Stroke in her mother.  Prior Rehab/Hospitalizations:  Has the patient had major surgery during 100 days prior to admission? No  Current Medications   Current Facility-Administered Medications:  .  acetaminophen (TYLENOL) tablet 650 mg, 650 mg, Oral, Q6H PRN **OR** acetaminophen (TYLENOL) suppository 650 mg, 650 mg, Rectal, Q6H PRN, Simaan, Elizabeth S, PA-C .  allopurinol (ZYLOPRIM) tablet 100 mg, 100 mg, Oral, Daily, Simaan, Elizabeth S, PA-C, 100 mg at 07/10/18 1217 .  collagenase (SANTYL) ointment, , Topical, BID, Simaan, Elizabeth S, PA-C .  enoxaparin (LOVENOX) injection 40 mg, 40 mg, Subcutaneous, Q24H, Simaan, Elizabeth S, PA-C, 40 mg at 07/10/18 1218 .  feeding supplement (ENSURE ENLIVE) (ENSURE ENLIVE) liquid 237 mL, 237 mL, Oral, BID BM, Oswald Hillock, MD, 237 mL at 07/10/18 2120 .  feeding supplement (PRO-STAT SUGAR FREE 64) liquid 30 mL, 30 mL, Oral, BID, Darrick Meigs, Marge Duncans, MD, 30 mL at 07/10/18 2120 .  fentaNYL (SUBLIMAZE) injection 25 mcg, 25 mcg, Intravenous, Q2H PRN, Jill Alexanders, PA-C, 25 mcg at 07/11/18 0346 .  furosemide (LASIX) tablet 40 mg, 40 mg, Oral, Daily, Darrick Meigs, Marge Duncans, MD, 40 mg at 07/10/18 1218 .  ipratropium-albuterol (DUONEB) 0.5-2.5 (3) MG/3ML nebulizer solution 3 mL, 3 mL, Nebulization, Q6H PRN, Oswald Hillock, MD, 3 mL at 07/07/18 1749 .  levothyroxine (SYNTHROID, LEVOTHROID) tablet 137 mcg, 137 mcg, Oral, QAC breakfast, Jill Alexanders, PA-C, 137 mcg at 07/11/18 [phone] .  liver oil-zinc oxide (DESITIN) 40 % ointment, , Topical, BID, Darrick Meigs, Marge Duncans, MD .  ondansetron [facility]) injection 4 mg, 4 mg, Intravenous, Q6H PRN, Schorr, Rhetta Mura, NP, 4 mg at 07/08/18 0416 .  oxybutynin (DITROPAN) tablet 5 mg, 5 mg, Oral, BID, Alexis Frock, MD, 5 mg at 07/10/18 2120 .  sertraline (ZOLOFT) tablet 50 mg, 50 mg, Oral, Daily,  Simaan, Elizabeth S, PA-C, 50 mg at 07/10/18 1219 .  traMADol (ULTRAM) tablet 50 mg, 50 mg, Oral, Q6H PRN, Jill Alexanders, PA-C, 50 mg at 07/10/18 1761  Patients Current Diet:  Diet Order            Diet - low sodium heart healthy        Diet regular Room service appropriate? Yes; Fluid consistency: Thin  Diet effective now              Precautions / Restrictions Precautions Precautions: Fall Precaution Comments: colostomy, suprapubic catheter, large buttock wound Restrictions Weight Bearing Restrictions: No   Has the patient had 2 or more falls or a fall with injury in the past year?No  Prior Activity Level Limited Community (1-2x/wk): walks Materials engineer / Equipment Home Assistive Devices/Equipment: Environmental consultant (specify type),  Wheelchair, Radio producer (specify quad or straight), Eyeglasses, Dentures (specify type), CPAP, Grab bars in shower, Raised toilet seat with rails  Prior Device Use: Indicate devices/aids used by the patient prior to current illness, exacerbation or injury? cane as well as use of R orthotic boot for RLE foot stability  Prior Functional Level Prior Function Level of Independence: Independent  Self Care: Did the patient need help bathing, dressing, using the toilet or eating?  Independent  Indoor Mobility: Did the patient need assistance with walking from room to room (with or without device)? Independent  Stairs: Did the patient need assistance with internal or external stairs (with or without device)? Independent  Functional Cognition: Did the patient need help planning regular tasks such as shopping or remembering to take medications? Independent  Current Functional Level Cognition  Overall Cognitive Status: Within Functional Limits for tasks assessed Orientation Level: Oriented X4    Extremity Assessment (includes Sensation/Coordination)  Upper Extremity Assessment: Generalized weakness  Lower Extremity Assessment: Defer to PT  evaluation    ADLs  Overall ADL's : Needs assistance/impaired Eating/Feeding: Set up, Bed level Grooming: Wash/dry face, Set up, Bed level Upper Body Bathing: Minimal assistance, Sitting Lower Body Bathing: Total assistance, +2 for safety/equipment, Sit to/from stand Upper Body Dressing : Minimal assistance, Sitting Lower Body Dressing: Total assistance, +2 for physical assistance, +2 for safety/equipment, Sit to/from stand Toilet Transfer: (N/A, foley and ostomy ) Functional mobility during ADLs: +2 for physical assistance, +2 for safety/equipment, Maximal assistance, Rolling walker    Mobility  Overal bed mobility: Needs Assistance Bed Mobility: Sidelying to Sit Rolling: Mod assist, +2 for physical assistance Sidelying to sit: Max assist, +2 for physical assistance Supine to sit: Mod assist, +2 for physical assistance Sit to supine: Mod assist, +2 for physical assistance Sit to sidelying: Mod assist, Max assist, +2 for physical assistance General bed mobility comments: assist for legs off bed and to lift trunk with cues (attempted to roll to R, but pt unable to tolerate), to supine assist for trunk and legs(vc for technique to maximize body mechanics)    Transfers  Overall transfer level: Needs assistance Equipment used: Rolling walker (2 wheeled) Transfers: Sit to/from Stand Sit to Stand: Max assist, +2 physical assistance, Mod assist General transfer comment: cues for hand placement, assist for lifting x 2 trials with momentum(vc for RW management)    Ambulation / Gait / Stairs / Wheelchair Mobility  Ambulation/Gait Ambulation/Gait assistance: Min assist, +2 physical assistance Gait Distance (Feet): 25 Feet Assistive device: Rolling walker (2 wheeled) Gait Pattern/deviations: Step-to pattern, Shuffle, Decreased stride length, Wide base of support General Gait Details: Able to ambulate with RW with cues for technique out to hallway.  Had to have chair follow and sat down once  in hallway.  Gait velocity interpretation: <1.31 ft/sec, indicative of household ambulator    Posture / Balance Dynamic Sitting Balance Sitting balance - Comments: (pt leaning back, requiring min-mod A to stabilize) Balance Overall balance assessment: Needs assistance Sitting-balance support: Bilateral upper extremity supported, Feet supported Sitting balance-Leahy Scale: Poor Sitting balance - Comments: (pt leaning back, requiring min-mod A to stabilize) Postural control: Posterior lean Standing balance support: Bilateral upper extremity supported Standing balance-Leahy Scale: Poor Standing balance comment: UE support and assist for balance    Special needs/care consideration BiPAP/CPAP: Use of CPAP and inhaler per family report CPM: No Continuous Drip IV: No Dialysis: No        Days: No Life Vest: no Oxygen: No Special Bed: use of  air bed due to extensive gluteal wound Trach Size: No Wound Vac (area): Not currently      Skin: 1) pressure injury from 06/27/18: unstageable, full thickness tissue loss in which the base of the ulcer is covered by slough and/or eschar-healing with early partial granulation with pink, red, and yellow appearance; peri wound is intact with erythema; wound is tunneling with unattached edges and scant serosanguineous drainage   2) abdominal incision for LUQ Colostomy 3) suprapubic cath (some drainage), 4) ecchymosis on Bilateral hands/arms 5) bruising and swelling to base of L thumb                    Bowel mgmt: pt has new colostomy Bladder mgmt: external catheter (suprapubic) Diabetic mgmt: No     Previous Home Environment Living Arrangements: Other (Comment)(pt hopeful to inpt rehab but will d/c to daughters home foll) [place]: No  Discharge Living Setting Plans for Discharge Living Setting: Other (Comment)(pt plans to DC to pt's daughter's house (Cindy's)) Type of Home at Discharge: Other (Comment)(triple wide trailor) Discharge Home  Layout: One level Discharge Home Access: Stairs to enter Entrance Stairs-Rails: Right Entrance Stairs-Number of Steps: 1 step, 2 steps, 1 step  Discharge Bathroom Shower/Tub: Walk-in shower Discharge Bathroom Toilet: Handicapped height Discharge Bathroom Accessibility: Yes(RW) How Accessible: Accessible via walker Does the patient have any problems obtaining your medications?: No  Social/Family/Support Systems Patient Roles: Other (Comment)(lives with nephew; completed house chores, walked dog) Contact Information: has two daugthers Advertising copywriter and [place]) and son Delfino Lovett) Anticipated Caregiver: Plan to go to Cindy's house due to more accessible home and caregiver support Anticipated Caregiver's Contact Information: [name]: [phone]; Hassan Rowan [phone]), [name] [phone]) Ability/Limitations of Caregiver: pt will need to be +1 for transfers; pt will have 24/7 caregiver support from [place] and Cindy's husband Caregiver Availability: 24/7 Discharge Plan Discussed with Primary Caregiver: Yes Is Caregiver In Agreement with Plan?: Yes Does Caregiver/Family have Issues with Lodging/Transportation while Pt is in Rehab?: No   Goals/Additional Needs Patient/Family Goal for Rehab: PT: Min A; OT: Min/Mod A; SLP: NA Expected length of stay: 14-17 days Cultural Considerations: Baptist Dietary Needs: Heart diet; thin liquids Equipment Needs: TBD Special Service Needs: colostomy training, wound care: hydrotherapy Additional Information: family would benefit from transfer training  Pt/Family Agrees to Admission and willing to participate: Yes(pt and two daughters) Program Orientation Provided & Reviewed with Pt/Caregiver Including Roles  & Responsibilities: Yes(pt and two daughters)  Barriers to Discharge: Medical stability, Home environment access/layout, Incontinence, Wound Care  Barriers to Discharge Comments: steps to enter home; large wound requiring hydroptherapy. new colostomy and  suprapubic catheter   Decrease burden of Care through IP rehab admission: Decrease caregiver assist to +1 to allow pt to return home with daughter, care for new colostomy and suprapubic catheter, manage wound care.    Possible need for SNF placement upon discharge: Not anticipated; Family does not want pt to be placed in SNF and plan to take her home with them once able.    Patient Condition: This patient's medical and functional status has changed since the consult dated: 07/04/18 in which the Rehabilitation Physician determined and documented that the patient's condition is appropriate for intensive rehabilitative care in an inpatient rehabilitation facility. See "History of Present Illness" (above) for medical update. Functional changes are: Mod/Max A x2 transfers with RW and Min A x2 for ambulation 25 feet. Patient's medical and functional status update has been discussed with the Rehabilitation physician and patient remains appropriate  for inpatient rehabilitation. Will admit to inpatient rehab today.  Preadmission Screen Completed By:  Jhonnie Garner, 07/11/2018 9:31 AM ______________________________________________________________________   Discussed status with Dr. Letta Pate on 07/11/18 at 10:37AM and received telephone approval for admission today.  Admission Coordinator:  Jhonnie Garner, time 10:37AM/Date 07/11/18.

## 2018-07-06 LAB — BASIC METABOLIC PANEL
ANION GAP: 9 (ref 5–15)
BUN: 31 mg/dL — AB (ref 8–23)
CO2: 18 mmol/L — ABNORMAL LOW (ref 22–32)
Calcium: 8.2 mg/dL — ABNORMAL LOW (ref 8.9–10.3)
Chloride: 117 mmol/L — ABNORMAL HIGH (ref 98–111)
Creatinine, Ser: 1.33 mg/dL — ABNORMAL HIGH (ref 0.44–1.00)
GFR calc Af Amer: 40 mL/min — ABNORMAL LOW (ref >60)
GFR, EST NON AFRICAN AMERICAN: 35 mL/min — AB (ref >60)
GLUCOSE: 130 mg/dL — AB (ref 70–99)
POTASSIUM: 4 mmol/L (ref 3.5–5.1)
SODIUM: 144 mmol/L (ref 135–145)

## 2018-07-06 NOTE — Progress Notes (Signed)
Family at bedside. Stated patient didn't want on at this time but she would be able to assist her when/if she was ready. Told her to have RN to call if needed RT assistance.

## 2018-07-06 NOTE — Consult Note (Addendum)
WOC Nurse ostomy follow up Surgical team and PT following for assessment and plan of care for buttocks wound and hydrotherapy. Please consult WOC team for Vac application when this plan of care is desired by the surgical team.   Stoma type/location: Colostomy stoma from surgery performed 8/6, dark red and viable stoma Stomal assessment/size: 1 1/4 inches, flush with skin level, red rubber rod in place  Peristomal assessment:  Intact skin surrounding Treatment options for stomal/peristomal skin:  Applied barrier ring to attempt to maintain a seal  Output: mod amt liquid brown stool Ostomy pouching: 1pc. Flexible convex with barrier ring Education provided:  Demonstrated pouch change to patient and family members.  Family states they are familiar with pouch changes since another person close to them has one. 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  Supplies ordered to the bedside for staff nurse use. Educational materails left at the bedside. Reviewed pouching routines and ordering supplies. WOC team will continue teaching sessions while in the hospital. Enrolled patient in [facility] [employer] Discharge program: Yes Rebecca Mcgeeawn [name] MSN, RN, [place], [place]AP, [place] [phone]

## 2018-07-06 NOTE — Progress Notes (Signed)
3 Days Post-Op   Subjective/Chief Complaint: Pt with no acute changes   Objective: Vital signs in last 24 hours: Temp:  [97.7 F (36.5 C)-98.5 F (36.9 C)] 97.7 F (36.5 C) (08/09 0431) Pulse Rate:  [35-63] 63 (08/09 0431) Resp:  [24-28] 27 (08/09 0431) BP: (119-149)/(52-76) 126/61 (08/09 0431) SpO2:  [95 %-97 %] 97 % (08/09 0431) Weight:  [114.5 kg] 114.5 kg (08/09 0431) Last BM Date: 07/06/18  Intake/Output from previous day: 08/08 0701 - 08/09 0700 In: -  Out: 250 [Stool:250] Intake/Output this shift: No intake/output data recorded.  General appearance: alert and cooperative GI: soft, non-tender; bowel sounds normal; no masses,  no organomegaly and ostomy pink/patent  Lab Results:  Recent Labs    07/04/18 0318 07/05/18 0628  WBC 19.8* 16.9*  HGB 8.7* 8.1*  HCT 27.5* 25.2*  PLT 128* 125*   BMET Recent Labs    07/05/18 0628 07/06/18 0405  NA 142 144  K 4.0 4.0  CL 116* 117*  CO2 17* 18*  GLUCOSE 110* 130*  BUN 34* 31*  CREATININE 1.48* 1.33*  CALCIUM 8.1* 8.2*   PT/INR No results for input(s): LABPROT, INR in the last 72 hours. ABG No results for input(s): PHART, HCO3 in the last 72 hours.  Invalid input(s): PCO2, PO2  Studies/Results: No results found.  Anti-infectives: Anti-infectives (From admission, onward)   Start     Dose/Rate Route Frequency Ordered Stop   07/04/18 1400  amoxicillin-clavulanate (AUGMENTIN) 875-125 MG per tablet 1 tablet     1 tablet Oral Every 12 hours 07/04/18 1332 07/09/18 2359   07/04/18 1300  cephALEXin (KEFLEX) capsule 500 mg  Status:  Discontinued     500 mg Oral Every 12 hours 07/04/18 1212 07/04/18 1332   06/30/18 1300  piperacillin-tazobactam (ZOSYN) IVPB 3.375 g  Status:  Discontinued     3.375 g 12.5 mL/hr over 240 Minutes Intravenous Every 8 hours 06/30/18 1242 07/04/18 1212   06/29/18 0900  vancomycin (VANCOCIN) 1,500 mg in sodium chloride 0.9 % 500 mL IVPB  Status:  Discontinued     1,500 mg 250 mL/hr  over 120 Minutes Intravenous Every 48 hours 06/29/18 0805 07/02/18 1516   06/28/18 1055  vancomycin variable dose per unstable renal function (pharmacist dosing)  Status:  Discontinued      Does not apply See admin instructions 06/28/18 1055 06/29/18 0805   06/27/18 2345  piperacillin-tazobactam (ZOSYN) IVPB 2.25 g  Status:  Discontinued     2.25 g 100 mL/hr over 30 Minutes Intravenous Every 8 hours 06/27/18 2336 06/30/18 1242   06/27/18 2315  vancomycin (VANCOCIN) IVPB 750 mg/150 ml premix  Status:  Discontinued    Note to Pharmacy:  Pharmacy to dose and monitor levels as appropriate for necrotizing fascitis   750 mg 150 mL/hr over 60 Minutes Intravenous Every 12 hours 06/27/18 2303 06/27/18 2329   06/27/18 2315  clindamycin (CLEOCIN) IVPB 600 mg  Status:  Discontinued     600 mg 100 mL/hr over 30 Minutes Intravenous Every 8 hours 06/27/18 2309 07/02/18 1437   06/27/18 1215  vancomycin (VANCOCIN) 1,500 mg in sodium chloride 0.9 % 500 mL IVPB     1,500 mg 250 mL/hr over 120 Minutes Intravenous  Once 06/27/18 1142 06/27/18 1500   06/27/18 1100  metroNIDAZOLE (FLAGYL) IVPB 500 mg  Status:  Discontinued     500 mg 100 mL/hr over 60 Minutes Intravenous Every 8 hours 06/27/18 1052 06/27/18 2303   06/27/18 0200  cefTRIAXone (ROCEPHIN) 1  g in sodium chloride 0.9 % 100 mL IVPB  Status:  Discontinued     1 g 200 mL/hr over 30 Minutes Intravenous Every 24 hours 06/27/18 0138 06/27/18 2258      Assessment/Plan: s/p Procedure(s): LAPAROSCOPIC DIVERTED COLOSTOMY (N/A) INSERTION OF SUPRAPUBIC CATHETER (N/A) Con't ostomy care Con't wound hydrotx.  Will be back around to see wound with hydrotx Will be available over weekend for any questions.   LOS: 10 days    Marigene EhlersRamirez Jr., [facility] 07/06/2018

## 2018-07-06 NOTE — Progress Notes (Signed)
Physical Therapy Treatment Patient Details Name: [name] MRN: [id number] DOB: 1928/02/22 Today's Date: 07/06/2018    History of Present Illness Pt is an 84 y/o female with PMH significant for HTN, hypothyroidism who presents with 1 week buttock pain, followed by malaise, diarrhea, and weakness. In ED, pt found to be hypotensive with leukocytosis and lactic acidosis. Pt is now s/p 2 I&D of buttock wound and is undergoing hydrotherapy. Plan for lap colostomy 8/6.    PT Comments    Patient progressing some this session with taking steps forward and back with walker and standing x 2 with walker.  More SOB today and less interactive with flat affect throughout.  Continue to feel she will benefit from skilled PT in the acute setting and follow up CIR level rehab prior to d/c home.   Follow Up Recommendations  Supervision/Assistance - 24 hour;CIR     Equipment Recommendations  Other (comment)(TBA)    Recommendations for Other Services       Precautions / Restrictions Precautions Precautions: Fall Precaution Comments: colostomy, suprapubic catheter, large buttock wound    Mobility  Bed Mobility Overal bed mobility: Needs Assistance Bed Mobility: Supine to Sit;Sit to Supine     Supine to sit: Mod assist;+2 for physical assistance Sit to supine: Mod assist;+2 for physical assistance   General bed mobility comments: assist for legs off bed and to lift trunk with cues (attempted to roll to R, but pt unable to tolerate), to supine assist for trunk and legs  Transfers Overall transfer level: Needs assistance Equipment used: Rolling walker (2 wheeled) Transfers: Sit to/from Stand Sit to Stand: +2 physical assistance;Mod assist;From elevated surface         General transfer comment: cues for hand placement, assist for lifting x 2 trials with momentum  Ambulation/Gait Ambulation/Gait assistance: Min assist;+2 physical assistance Gait Distance (Feet): 3 Feet(x 2) Assistive  device: Rolling walker (2 wheeled) Gait Pattern/deviations: Step-to pattern;Shuffle;Decreased stride length;Wide base of support     General Gait Details: forward and back x 2 in standing then second bout side steps to [facility], R hip with audible crepitus   Stairs             Wheelchair Mobility    Modified Rankin (Stroke Patients Only)       Balance Overall balance assessment: Needs assistance   Sitting balance-Leahy Scale: Poor Sitting balance - Comments: leaning back initially in sitting with min A, then with one UE supported and S level assist   Standing balance support: Bilateral upper extremity supported Standing balance-Leahy Scale: Poor Standing balance comment: UE support and assist for balance                            Cognition Arousal/Alertness: Awake/alert Behavior During Therapy: Flat affect Overall Cognitive Status: Within Functional Limits for tasks assessed                                        Exercises General Exercises - Lower Extremity Ankle Circles/Pumps: AROM;10 reps;Both;Supine Short Arc Quad: AROM;10 reps;Both;Supine Heel Slides: AROM;AAROM;5 reps;Both;Supine    General Comments General comments (skin integrity, edema, etc.): daughter in room and reports pt more lethargic yesterday and weaker, but very hopeful for CIR      Pertinent Vitals/Pain Pain Score: 7  Pain Location: R hip Pain Descriptors / Indicators: Sore;Aching Pain  Intervention(s): Monitored during session;Repositioned    Home Living                      Prior Function            PT Goals (current goals can now be found in the care plan section) Progress towards PT goals: Progressing toward goals    Frequency    Min 3X/week      PT Plan Current plan remains appropriate    Co-evaluation              AM-PAC PT "6 Clicks" Daily Activity  Outcome Measure  Difficulty turning over in bed (including adjusting  bedclothes, sheets and blankets)?: Unable Difficulty moving from lying on back to sitting on the side of the bed? : Unable Difficulty sitting down on and standing up from a chair with arms (e.g., wheelchair, bedside commode, etc,.)?: Unable Help needed moving to and from a bed to chair (including a wheelchair)?: A Lot Help needed walking in hospital room?: A Lot Help needed climbing 3-5 steps with a railing? : Total 6 Click Score: 8    End of Session Equipment Utilized During Treatment: Gait belt Activity Tolerance: Patient tolerated treatment well Patient left: in bed;with call bell/phone within reach;with family/visitor present   PT Visit Diagnosis: Unsteadiness on feet (R26.81);Pain;Muscle weakness (generalized) (M62.81) Pain - Right/Left: Right Pain - part of body: Hip     Time: 0922-0947 PT Time Calculation (min) (ACUTE ONLY): 25 min  Charges:  $Therapeutic Exercise: 8-22 mins $Therapeutic Activity: 8-22 mins                     Pooleryndi Wynn, [place]PT [phone] 07/06/2018    Elray Mcgregorynthia Wynn 07/06/2018, 10:41 AM

## 2018-07-06 NOTE — Progress Notes (Addendum)
   Increased granulation tissue compared to previous exam. Still with slough over medial aspect of wound. Surrounding erythema improved. No induration.  Patient stable for transfer to inpatient rehab, we can examine the wound in rehab if necessary. Wound care should not delay her transfer.  Hosie SpangleElizabeth Jeanpierre Thebeau, PA-C [facility] Pager: [phone]

## 2018-07-06 NOTE — Progress Notes (Signed)
[facility]  PROGRESS NOTE  [name] ZOX:[id number] DOB: 05/07/1928 DOA: 06/26/2018 PCP: Kirby Funk, MD   Brief HPI:   84 year old female with a history of hypothyroidism came with 1 week history of buttock pain, malaise, diarrhea and weakness.  She went to urgent care and she was told that she was dehydrated.  She came to the ER was found to have BP 80/40, leukocytosis, creatinine 5.5, lactic acidosis.  Patient improved after she was given fluid boluses.  Started on ceftriaxone for presumed UTI.  CT abdomen showed enteritis, buttock cellulitis.  Examination of buttock showed cellulitis with necrosis.  Patient admitted for sepsis/Fournier's gangrene.  Started on empiric antibiotics.  Surgery was consulted for debridement.    Subjective   Patient seen and examined, denies any complaints.  Awaiting CIR consult for rehab.   Assessment/Plan:     1. Sepsis from Fournier's gangrene-resolved, status post debridement of left gluteal necrotizing soft tissue infection by Dr. Cliffton Asters on 06/27/2018.  Status post debridement of large sacral, perineal wound by Dr. Luisa Hart on 06/29/2018, status post diverting colostomy placement and status post catheter placement 07/03/2018.  Wound culture grew Klebsiella pneumoniae sensitive to cefazolin.  Patient was started on Augmentin yesterday.  WBC is down to 16.9.  Will check CBC in a.m.  IV Zosyn was discontinued.    Blood cultures x2 remain negative to date.  Fungus culture is still pending.  Continue hydrotherapy, dressing changes as per surgery recommendations. 2. UTI-urine culture from 06/27/2018 showed Enterobacter, sensitive to Zosyn.  Patient is currently on Augmentin.  Total 10 days of therapy, starting from 06/29/18 3. Acute kidney injury-patient's baseline creatinine is 1.4-1.5, she came in with creatinine of 5.5.  Creatinine is slowly improving.  Home Lasix is currently on hold.  Today creatinine is 1.33.  At baseline.  Follow BMP in am. 4. Chronic  normocytic anemia-patient's baseline hemoglobin is around 10-11.  FOBT negative.  There was low suspicion for GI bleed, gastroenterology was consulted for enteritis but at that time patient was found to have buttock cellulitis and abscess so GI signed off.  Hemoglobin stable at 8.1.  Closely monitor patient's globin and transfuse for hemoglobin less than 7. 5. Hypothyroidism-continue Synthroid 6. History of hypertension-patient was hypotensive admission.  Currently blood pressure remains stable.  Currently Lasix and ARB on hold 7. Enteritis-nonspecific finding seen on CT scan. No diarrhea,  monitor stools.    DVT prophylaxis: SCDs  Code Status: DNR  Family Communication: Discussed with patient's family at bedside  Disposition Plan: likely home vs CIR   Consultants:  GI  Nephrology  General surgery  Procedures:  None   Antibiotics:   Anti-infectives (From admission, onward)   Start     Dose/Rate Route Frequency Ordered Stop   07/04/18 1400  amoxicillin-clavulanate (AUGMENTIN) 875-125 MG per tablet 1 tablet     1 tablet Oral Every 12 hours 07/04/18 1332 07/09/18 2359   07/04/18 1300  cephALEXin (KEFLEX) capsule 500 mg  Status:  Discontinued     500 mg Oral Every 12 hours 07/04/18 1212 07/04/18 1332   06/30/18 1300  piperacillin-tazobactam (ZOSYN) IVPB 3.375 g  Status:  Discontinued     3.375 g 12.5 mL/hr over 240 Minutes Intravenous Every 8 hours 06/30/18 1242 07/04/18 1212   06/29/18 0900  vancomycin (VANCOCIN) 1,500 mg in sodium chloride 0.9 % 500 mL IVPB  Status:  Discontinued     1,500 mg 250 mL/hr over 120 Minutes Intravenous Every 48 hours 06/29/18 0805 07/02/18 1516  06/28/18 1055  vancomycin variable dose per unstable renal function (pharmacist dosing)  Status:  Discontinued      Does not apply See admin instructions 06/28/18 1055 06/29/18 0805   06/27/18 2345  piperacillin-tazobactam (ZOSYN) IVPB 2.25 g  Status:  Discontinued     2.25 g 100 mL/hr over 30 Minutes  Intravenous Every 8 hours 06/27/18 2336 06/30/18 1242   06/27/18 2315  vancomycin (VANCOCIN) IVPB 750 mg/150 ml premix  Status:  Discontinued    Note to Pharmacy:  Pharmacy to dose and monitor levels as appropriate for necrotizing fascitis   750 mg 150 mL/hr over 60 Minutes Intravenous Every 12 hours 06/27/18 2303 06/27/18 2329   06/27/18 2315  clindamycin (CLEOCIN) IVPB 600 mg  Status:  Discontinued     600 mg 100 mL/hr over 30 Minutes Intravenous Every 8 hours 06/27/18 2309 07/02/18 1437   06/27/18 1215  vancomycin (VANCOCIN) 1,500 mg in sodium chloride 0.9 % 500 mL IVPB     1,500 mg 250 mL/hr over 120 Minutes Intravenous  Once 06/27/18 1142 06/27/18 1500   06/27/18 1100  metroNIDAZOLE (FLAGYL) IVPB 500 mg  Status:  Discontinued     500 mg 100 mL/hr over 60 Minutes Intravenous Every 8 hours 06/27/18 1052 06/27/18 2303   06/27/18 0200  cefTRIAXone (ROCEPHIN) 1 g in sodium chloride 0.9 % 100 mL IVPB  Status:  Discontinued     1 g 200 mL/hr over 30 Minutes Intravenous Every 24 hours 06/27/18 0138 06/27/18 2258       Objective   Vitals:   07/05/18 1644 07/05/18 2016 07/06/18 0431 07/06/18 1200  BP:  119/76 126/61 (!) 126/54  Pulse:  (!) 35 63 67  Resp:  (!) 28 (!) 27 19  Temp: 97.7 F (36.5 C) 98.5 F (36.9 C) 97.7 F (36.5 C) 98.7 F (37.1 C)  TempSrc: Oral Oral Oral Axillary  SpO2:  95% 97% 94%  Weight:   114.5 kg   Height:        Intake/Output Summary (Last 24 hours) at 07/06/2018 1343 Last data filed at 07/05/2018 2300 Gross per 24 hour  Intake -  Output 250 ml  Net -250 ml   Filed Weights   07/03/18 0610 07/04/18 0500 07/06/18 0431  Weight: 115.7 kg 115.3 kg 114.5 kg     Physical Examination:     General: Appears in no acute distress  Cardiovascular: S1-S2, regular no murmurs auscultated  Respiratory: Clear to auscultation bilaterally  Abdomen: Soft, nontender, no organomegaly  Musculoskeletal: Trace edema of the lower extremities      Data  Reviewed: I have personally reviewed following labs and imaging studies  CBG: No results for input(s): GLUCAP in the last 168 hours.  CBC: Recent Labs  Lab 07/02/18 0356 07/03/18 0447 07/03/18 1557 07/04/18 0318 07/05/18 0628  WBC 21.4* 20.4* 26.0* 19.8* 16.9*  HGB 8.2* 8.4* 9.4* 8.7* 8.1*  HCT 25.2* 26.0* 30.1* 27.5* 25.2*  MCV 94.7 94.5 97.4 96.2 95.5  PLT 109* 111* 125* 128* 125*    Basic Metabolic Panel: Recent Labs  Lab 07/02/18 0356 07/03/18 0447 07/04/18 0318 07/05/18 0628 07/06/18 0405  NA 143 143 142 142 144  K 3.9 3.9 4.2 4.0 4.0  CL 117* 115* 113* 116* 117*  CO2 16* 16* 15* 17* 18*  GLUCOSE 103* 111* 147* 110* 130*  BUN 59* 47* 38* 34* 31*  CREATININE 1.76* 1.65* 1.58* 1.48* 1.33*  CALCIUM 8.1* 8.1* 8.1* 8.1* 8.2*    Recent Results (from the  past 240 hour(s))  Urine Culture     Status: Abnormal   Collection Time: 06/27/18 12:43 AM  Result Value Ref Range Status   Specimen Description URINE, RANDOM  Final   Special Requests   Final    NONE Performed at [facility], [address], [place], [place] [address]    Culture >=100,000 COLONIES/mL ENTEROBACTER CLOACAE (A)  Final   Report Status 06/29/2018 FINAL  Final   Organism ID, Bacteria ENTEROBACTER CLOACAE (A)  Final      Susceptibility   Enterobacter cloacae - MIC*    CEFAZOLIN >=64 RESISTANT Resistant     CEFTRIAXONE 2 SENSITIVE Sensitive     CIPROFLOXACIN <=0.25 SENSITIVE Sensitive     GENTAMICIN <=1 SENSITIVE Sensitive     IMIPENEM <=0.25 SENSITIVE Sensitive     NITROFURANTOIN 64 INTERMEDIATE Intermediate     TRIMETH/SULFA <=20 SENSITIVE Sensitive     PIP/TAZO 8 SENSITIVE Sensitive     * >=100,000 COLONIES/mL ENTEROBACTER CLOACAE  MRSA PCR Screening     Status: None   Collection Time: 06/27/18  3:16 AM  Result Value Ref Range Status   MRSA by PCR NEGATIVE NEGATIVE Final    Comment:        The GeneXpert MRSA Assay (FDA approved for NASAL specimens only), is one component of  a comprehensive MRSA colonization surveillance program. It is not intended to diagnose MRSA infection nor to guide or monitor treatment for MRSA infections. Performed at [facility], [address], [place], [place] [address]   Culture, blood (Routine X 2) w Reflex to ID Panel     Status: None   Collection Time: 06/27/18  4:05 AM  Result Value Ref Range Status   Specimen Description BLOOD LEFT ANTECUBITAL  Final   Special Requests IN PEDIATRIC BOTTLE Blood Culture adequate volume  Final   Culture   Final    NO GROWTH 5 DAYS Performed at [facility], [address] [place], [place] [address]    Report Status 07/02/2018 FINAL  Final  Culture, blood (Routine X 2) w Reflex to ID Panel     Status: None   Collection Time: 06/27/18  4:20 AM  Result Value Ref Range Status   Specimen Description BLOOD LEFT HAND  Final   Special Requests IN PEDIATRIC BOTTLE Blood Culture adequate volume  Final   Culture   Final    NO GROWTH 5 DAYS Performed at [facility], [address], [place], [place] [address]    Report Status 07/02/2018 FINAL  Final  Fungus Culture With Stain     Status: None (Preliminary result)   Collection Time: 06/27/18  6:01 PM  Result Value Ref Range Status   Fungus Stain Final report  Final    Comment: (NOTE) Performed At: [facility] [address] [place], [place] [id number] Jolene Schimke MD MV:[id number]    Fungus (Mycology) Culture PENDING  Incomplete   Fungal Source ABSCESS  Final    Comment: LEFT BUTTOCKS ON VANC Performed at [facility], [address] [place], [place] [address]   Aerobic/Anaerobic Culture (surgical/deep wound)     Status: None   Collection Time: 06/27/18  6:01 PM  Result Value Ref Range Status   Specimen Description ABSCESS  Final   Special Requests NONE  Final   Gram Stain   Final    NO WBC SEEN MODERATE GRAM POSITIVE COCCI IN PAIRS MODERATE GRAM NEGATIVE COCCOBACILLI Performed at [facility], [address] [place], [place] [address]    Culture   Final    MODERATE BACTEROIDES SPECIES BETA LACTAMASE POSITIVE RARE KLEBSIELLA PNEUMONIAE    Report Status 07/01/2018 FINAL  Final   Organism ID, Bacteria KLEBSIELLA PNEUMONIAE  Final      Susceptibility   Klebsiella pneumoniae - MIC*    AMPICILLIN RESISTANT Resistant     CEFAZOLIN <=4 SENSITIVE Sensitive     CEFEPIME <=1 SENSITIVE Sensitive     CEFTAZIDIME <=1 SENSITIVE Sensitive     CEFTRIAXONE <=1 SENSITIVE Sensitive     CIPROFLOXACIN <=0.25 SENSITIVE Sensitive     GENTAMICIN <=1 SENSITIVE Sensitive     IMIPENEM <=0.25 SENSITIVE Sensitive     TRIMETH/SULFA <=20 SENSITIVE Sensitive     AMPICILLIN/SULBACTAM <=2 SENSITIVE Sensitive     PIP/TAZO <=4 SENSITIVE Sensitive     Extended ESBL NEGATIVE Sensitive     * RARE KLEBSIELLA PNEUMONIAE  Fungus Culture Result     Status: None   Collection Time: 06/27/18  6:01 PM  Result Value Ref Range Status   Result 1 Comment  Final    Comment: (NOTE) KOH/Calcofluor preparation:  no fungus observed. Performed At: [facility] [address] [place], [place] [id number] Jolene SchimkeNagendra Sanjai MD XB:[id number]   Surgical pcr screen     Status: None   Collection Time: 06/29/18  8:56 AM  Result Value Ref Range Status   MRSA, PCR NEGATIVE NEGATIVE Final   Staphylococcus aureus NEGATIVE NEGATIVE Final    Comment: (NOTE) The Xpert SA Assay (FDA approved for NASAL specimens in patients 82 years of age and older), is one component of a comprehensive surveillance program. It is not intended to diagnose infection nor to guide or monitor treatment. Performed at [facility], [address] [place], [place] [address]      Liver Function Tests: No results for input(s): AST, ALT, ALKPHOS, BILITOT, PROT, ALBUMIN in the last 168 hours. No results for input(s): LIPASE, AMYLASE in the last 168 hours. No results for input(s): AMMONIA in the last 168 hours.  Cardiac Enzymes: No  results for input(s): CKTOTAL, CKMB, CKMBINDEX, TROPONINI in the last 168 hours. BNP (last 3 results) No results for input(s): BNP in the last 8760 hours.  ProBNP (last 3 results) No results for input(s): PROBNP in the last 8760 hours.    Studies: No results found.  Scheduled Meds: . allopurinol  100 mg Oral Daily  . amoxicillin-clavulanate  1 tablet Oral Q12H  . collagenase   Topical BID  . enoxaparin (LOVENOX) injection  40 mg Subcutaneous Q24H  . feeding supplement (ENSURE ENLIVE)  237 mL Oral BID BM  . feeding supplement (PRO-STAT SUGAR FREE 64)  30 mL Oral BID  . levothyroxine  137 mcg Oral QAC breakfast  . sertraline  50 mg Oral Daily      Time spent: 25 min  Meredeth IdeGagan S Lama   [facility] Pager [phone]. If 7PM-7AM, please contact night-coverage at [email or web address], Office  [phone]  password [email or web address]  07/06/2018, 1:43 PM  LOS: 10 days

## 2018-07-06 NOTE — Progress Notes (Signed)
RT called to pt room to place on CPAP when arrived and tried to place pt on home unit pt refused. Resting comfortably.

## 2018-07-06 NOTE — Evaluation (Signed)
Occupational Therapy Evaluation Patient Details Name: [name] MRN: [id number] DOB: 01/23/1928 Today's Date: 07/06/2018    History of Present Illness Pt is an 84 y/o female with PMH significant for HTN, hypothyroidism who presents with 1 week buttock pain, followed by malaise, diarrhea, and weakness. In ED, pt found to be hypotensive with leukocytosis and lactic acidosis. Pt is now s/p 2 I&D of buttock wound and is undergoing hydrotherapy. Plan for lap colostomy 8/6.   Clinical Impression   Pt admitted with above dx and now presenting with generalized weakness/balance deficits that impact her ability to perform ADLs. Pt required +2 assistance for all bed mobility/ sit<>stand from EOB, but was willing to push through fatigue/pain to advance to standing with +1 mod A. Pt took several steps with +1 max A, +2 present for safety to recliner. Increased time required throughout for pt rest breaks. Pt would benefit from continued OT services to maximize return to PLOF and reduce family burden. With a continuation of increased motivation/participation, pt would benefit from CIR.     Follow Up Recommendations  CIR    Equipment Recommendations  Other (comment)(defer to next venue of care)    Recommendations for Other Services Rehab consult     Precautions / Restrictions Precautions Precautions: Fall Precaution Comments: colostomy, suprapubic catheter, large buttock wound Restrictions Weight Bearing Restrictions: No      Mobility Bed Mobility Overal bed mobility: Needs Assistance Bed Mobility: Sidelying to Sit Rolling: Mod assist;+2 for physical assistance Sidelying to sit: Max assist;+2 for physical assistance Supine to sit: Max assist;+2 for physical assistance     General bed mobility comments: (vc for technique to maximize body mechanics)  Transfers Overall transfer level: Needs assistance Equipment used: Rolling walker (2 wheeled) Transfers: Sit to/from Stand Sit to Stand:  Max assist;+2 physical assistance         General transfer comment: (vc for RW management)    Balance Overall balance assessment: Needs assistance Sitting-balance support: Bilateral upper extremity supported;Feet supported Sitting balance-Leahy Scale: Poor Sitting balance - Comments: (pt leaning back, requiring min-mod A to stabilize) Postural control: Posterior lean Standing balance support: Bilateral upper extremity supported Standing balance-Leahy Scale: Poor Standing balance comment: UE support and assist for balance                           ADL either performed or assessed with clinical judgement   ADL Overall ADL's : Needs assistance/impaired Eating/Feeding: Set up;Bed level   Grooming: Wash/dry face;Set up;Bed level   Upper Body Bathing: Minimal assistance;Sitting   Lower Body Bathing: Total assistance;+2 for safety/equipment;Sit to/from stand   Upper Body Dressing : Minimal assistance;Sitting   Lower Body Dressing: Total assistance;+2 for physical assistance;+2 for safety/equipment;Sit to/from Database administratorstand   Toilet Transfer: (N/A, foley and ostomy )           Functional mobility during ADLs: +2 for physical assistance;+2 for safety/equipment;Maximal assistance;Rolling walker       Vision Baseline Vision/History: Wears glasses Wears Glasses: At all times Patient Visual Report: No change from baseline Vision Assessment?: No apparent visual deficits     Perception Perception Perception Tested?: No   Praxis Praxis Praxis tested?: Not tested    Pertinent Vitals/Pain Pain Assessment: No/denies pain Pain Score: 0-No pain     Hand Dominance Right   Extremity/Trunk Assessment Upper Extremity Assessment Upper Extremity Assessment: Generalized weakness   Lower Extremity Assessment Lower Extremity Assessment: Defer to PT evaluation   Cervical /  Trunk Assessment Cervical / Trunk Assessment: Kyphotic   Communication Communication Communication:  No difficulties   Cognition Arousal/Alertness: Awake/alert Behavior During Therapy: Flat affect Overall Cognitive Status: Within Functional Limits for tasks assessed                                     General Comments  daughter in room during session    Exercises General Exercises - Lower Extremity Ankle Circles/Pumps: AROM;10 reps;Both;Supine Short Arc Quad: AROM;10 reps;Both;Supine Heel Slides: AROM;AAROM;5 reps;Both;Supine   Shoulder Instructions      Home Living Family/patient expects to be discharged to:: Inpatient rehab Living Arrangements: Other (Comment)(pt hopeful to inpt rehab but will d/c to daughters home foll)                                      Prior Functioning/Environment Level of Independence: Independent                 OT Problem List: Decreased strength;Decreased activity tolerance;Decreased range of motion;Impaired balance (sitting and/or standing);Decreased coordination;Decreased safety awareness;Decreased knowledge of use of DME or AE;Obesity;Pain      OT Treatment/Interventions: Self-care/ADL training;Therapeutic exercise;Energy conservation;DME and/or AE instruction;Manual therapy;Modalities;Therapeutic activities;Patient/family education;Balance training    OT Goals(Current goals can be found in the care plan section) Acute Rehab OT Goals Patient Stated Goal: None stated during session OT Goal Formulation: With patient/family Time For Goal Achievement: 07/20/18 Potential to Achieve Goals: Fair  OT Frequency: Min 3X/week   Barriers to D/C: Decreased caregiver support          Co-evaluation              AM-PAC PT "6 Clicks" Daily Activity     Outcome Measure Help from another person eating meals?: A Little Help from another person taking care of personal grooming?: A Little Help from another person toileting, which includes using toliet, bedpan, or urinal?: Total Help from another person bathing  (including washing, rinsing, drying)?: A Lot Help from another person to put on and taking off regular upper body clothing?: A Little Help from another person to put on and taking off regular lower body clothing?: Total 6 Click Score: 13   End of Session Equipment Utilized During Treatment: Gait belt;Rolling walker Nurse Communication: Mobility status  Activity Tolerance: Patient limited by fatigue;Patient limited by lethargy Patient left: in chair;with call bell/phone within reach  OT Visit Diagnosis: Unsteadiness on feet (R26.81);Muscle weakness (generalized) (M62.81)                Time: 4098-1191 OT Time Calculation (min): 39 min Charges:  OT General Charges $OT Visit: 1 Visit OT Evaluation $OT Eval Moderate Complexity: 1 Mod OT Treatments $Therapeutic Activity: 23-37 mins   Crissie Reese OTR/L 07/06/2018, 2:33 PM

## 2018-07-06 NOTE — Progress Notes (Signed)
Physical Therapy Wound Treatment Patient Details  Name: [name] MRN: [id number] Date of Birth: [birth year]  Today's Date: 07/06/2018 Time: 2876-8115 Time Calculation (min): 70 min  Subjective  Subjective: Pt appears lethargic and was not very talkative throughout session. Mobilized with PT earlier in the morning. Patient and Family Stated Goals: "Go to rehab" (talking about CIR) Date of Onset: (Unsure) Prior Treatments: Surgical debridement 06/27/18 and 06/29/18. On 8/6 pt underwent diverting colostomy and SP catheter placement.  Pain Score: Premedicated. Appeared to have minimal pain throughout session.   Wound Assessment  Pressure Injury 06/27/18 Unstageable - Full thickness tissue loss in which the base of the ulcer is covered by slough (yellow, tan, gray, green or brown) and/or eschar (tan, brown or black) in the wound bed. Three areas of black eschar surrounded by blan (Active)  Wound Image   07/05/2018  1:35 PM  Dressing Type Barrier Film (skin prep);ABD;Gauze (Comment);Moist to dry;Montgomery straps 07/06/2018  1:11 PM  Dressing Changed;Clean;Dry;Intact 07/06/2018  1:11 PM  Dressing Change Frequency Daily 07/06/2018  1:11 PM  State of Healing Early/partial granulation 07/06/2018  1:11 PM  Site / Wound Assessment Pink;Red;Yellow;Granulation tissue;Bleeding;Clean 07/06/2018  1:11 PM  % Wound base Red or Granulating 75% 07/06/2018  1:11 PM  % Wound base Yellow/Fibrinous Exudate 25% 07/06/2018  1:11 PM  % Wound base Black/Eschar 0% 07/06/2018  1:11 PM  % Wound base Other/Granulation Tissue (Comment) 0% 07/06/2018  1:11 PM  Peri-wound Assessment Intact;Erythema (blanchable);Pink 07/06/2018  1:11 PM  Wound Length (cm) 16.2 cm 07/02/2018 12:00 PM  Wound Width (cm) 15 cm 07/02/2018 12:00 PM  Wound Depth (cm) 5 cm 07/02/2018 12:00 PM  Wound Surface Area (cm^2) 243 cm^2 07/02/2018 12:00 PM  Wound Volume (cm^3) 1215 cm^3 07/02/2018 12:00 PM  Tunneling (cm) 5.2 in center of wound 07/02/2018 12:00 PM  Undermining (cm) 2.1  cm 6-9:00, 3.9 cm 9-10:00, 3 cm 10-12:00 07/02/2018 12:00 PM  Margins Unattached edges (unapproximated) 07/06/2018  1:11 PM  Drainage Amount Minimal 07/06/2018  1:11 PM  Drainage Description Serosanguineous 07/06/2018  1:11 PM  Treatment Debridement (Selective);Hydrotherapy (Pulse lavage);Packing (Saline gauze) 07/06/2018  1:11 PM   Santyl applied to wound bed prior to applying dressing.     Hydrotherapy Pulsed lavage therapy - wound location: Buttock Pulsed Lavage with Suction (psi): 12 psi Pulsed Lavage with Suction - Normal Saline Used: 1000 mL Pulsed Lavage Tip: Tip with splash shield Selective Debridement Selective Debridement - Location: Buttock Selective Debridement - Tools Used: Forceps;Scissors Selective Debridement - Tissue Removed: Yellow slough   Wound Assessment and Plan  Wound Therapy - Assess/Plan/Recommendations Wound Therapy - Clinical Statement: Montgomery straps applied this session. Wound bed continues to appear improved. This patient will benefit from continued hydrotherapy for selective removal of unviable tissue, to decrease bioburden and promote wound bed healing. Wound Therapy - Functional Problem List: Decreased tolerance for OOB mobility/position changes Factors Delaying/Impairing Wound Healing: Immobility;Multiple medical problems;Altered sensation Hydrotherapy Plan: Debridement;Dressing change;Patient/family education;Pulsatile lavage with suction Wound Therapy - Frequency: 6X / week Wound Therapy - Follow Up Recommendations: Other (comment)(CIR) Wound Plan: See above  Wound Therapy Goals- Improve the function of patient's integumentary system by progressing the wound(s) through the phases of wound healing (inflammation - proliferation - remodeling) by: Decrease Necrotic Tissue to: 20% Decrease Necrotic Tissue - Progress: Progressing toward goal Increase Granulation Tissue to: 80% Increase Granulation Tissue - Progress: Progressing toward goal Goals/treatment  plan/discharge plan were made with and agreed upon by patient/family: Yes Time For Goal Achievement: 7  days Wound Therapy - Potential for Goals: Good  Goals will be updated until maximal potential achieved or discharge criteria met.  Discharge criteria: when goals achieved, discharge from hospital, MD decision/surgical intervention, no progress towards goals, refusal/missing three consecutive treatments without notification or medical reason.  GP     Thelma Comp 07/06/2018, 1:18 PM  Rolinda Roan, PT, DPT Acute Rehabilitation Services Pager: [phone]

## 2018-07-07 LAB — BASIC METABOLIC PANEL
Anion gap: 6 (ref 5–15)
BUN: 26 mg/dL — ABNORMAL HIGH (ref 8–23)
CO2: 21 mmol/L — ABNORMAL LOW (ref 22–32)
Calcium: 8.1 mg/dL — ABNORMAL LOW (ref 8.9–10.3)
Chloride: 115 mmol/L — ABNORMAL HIGH (ref 98–111)
Creatinine, Ser: 1.17 mg/dL — ABNORMAL HIGH (ref 0.44–1.00)
GFR calc Af Amer: 47 mL/min — ABNORMAL LOW (ref >60)
GFR, EST NON AFRICAN AMERICAN: 40 mL/min — AB (ref >60)
Glucose, Bld: 123 mg/dL — ABNORMAL HIGH (ref 70–99)
POTASSIUM: 4.2 mmol/L (ref 3.5–5.1)
Sodium: 142 mmol/L (ref 135–145)

## 2018-07-07 LAB — CBC
HEMATOCRIT: 26.6 % — AB (ref 36.0–46.0)
Hemoglobin: 8.3 g/dL — ABNORMAL LOW (ref 12.0–15.0)
MCH: 30.2 pg (ref 26.0–34.0)
MCHC: 31.2 g/dL (ref 30.0–36.0)
MCV: 96.7 fL (ref 78.0–100.0)
Platelets: 126 10*3/uL — ABNORMAL LOW (ref 150–400)
RBC: 2.75 MIL/uL — ABNORMAL LOW (ref 3.87–5.11)
RDW: 17 % — ABNORMAL HIGH (ref 11.5–15.5)
WBC: 15.2 10*3/uL — ABNORMAL HIGH (ref 4.0–10.5)

## 2018-07-07 MED ORDER — FUROSEMIDE 10 MG/ML IJ SOLN
40.0000 mg | Freq: Once | INTRAMUSCULAR | Status: AC
  Administered 2018-07-07: 40 mg via INTRAVENOUS
  Filled 2018-07-07: qty 4

## 2018-07-07 MED ORDER — IPRATROPIUM-ALBUTEROL 0.5-2.5 (3) MG/3ML IN SOLN
3.0000 mL | Freq: Four times a day (QID) | RESPIRATORY_TRACT | Status: DC | PRN
  Administered 2018-07-07: 3 mL via RESPIRATORY_TRACT
  Filled 2018-07-07: qty 3

## 2018-07-07 NOTE — Progress Notes (Addendum)
[facility]  PROGRESS NOTE  [name] ZOX:[id number] DOB: August 20, 1928 DOA: 06/26/2018 PCP: Kirby Funk, MD   Brief HPI:   84 year old female with a history of hypothyroidism came with 1 week history of buttock pain, malaise, diarrhea and weakness.  She went to urgent care and she was told that she was dehydrated.  She came to the ER was found to have BP 80/40, leukocytosis, creatinine 5.5, lactic acidosis.  Patient improved after she was given fluid boluses.  Started on ceftriaxone for presumed UTI.  CT abdomen showed enteritis, buttock cellulitis.  Examination of buttock showed cellulitis with necrosis.  Patient admitted for sepsis/Fournier's gangrene.  Started on empiric antibiotics.  Surgery was consulted for debridement.    Subjective   Patient seen and examined, complains of dysuria.  Awaiting transfer to rehab   Assessment/Plan:     1. Sepsis from Fournier's gangrene-resolved, status post debridement of left gluteal necrotizing soft tissue infection by Dr. Cliffton Asters on 06/27/2018.  Status post debridement of large sacral, perineal wound by Dr. Luisa Hart on 06/29/2018, status post diverting colostomy placement and status post catheter placement 07/03/2018.  Wound culture grew Klebsiella pneumoniae sensitive to cefazolin.  Patient was started on Augmentin yesterday.  WBC is down to 15,000.  Follow CBC in a.m.  IV Zosyn was discontinued.    Blood cultures x2 remain negative to date.  Fungus culture is still pending.  Continue hydrotherapy, dressing changes as per surgery recommendations. 2. UTI-urine culture from 06/27/2018 showed Enterobacter, sensitive to Zosyn.  Patient is currently on Augmentin.  Total 10 days of therapy, starting from 06/29/18.  Patient is already on antibiotics.  Complains of dysuria.  Recheck UA and culture 3. Acute kidney injury-patient's baseline creatinine is 1.4-1.5, she came in with creatinine of 5.5.  Creatinine is slowly improving.  Home Lasix is currently on hold.   Today creatinine is 1.17 at baseline.  Follow BMP in am. 4. Chronic normocytic anemia-patient's baseline hemoglobin is around 10-11.  FOBT negative.  There was low suspicion for GI bleed, gastroenterology was consulted for enteritis but at that time patient was found to have buttock cellulitis and abscess so GI signed off.  Hemoglobin stable at 8.1.  Closely monitor patient's globin and transfuse for hemoglobin less than 7. 5. Hypothyroidism-continue Synthroid 6. History of hypertension-patient was hypotensive admission.  Currently blood pressure remains stable.  Currently Lasix and ARB on hold 7. Enteritis-nonspecific finding seen on CT scan. No diarrhea,  monitor stools.    DVT prophylaxis: SCDs  Code Status: DNR  Family Communication: Discussed with patient's family at bedside  Disposition Plan: likely home vs CIR   Consultants:  GI  Nephrology  General surgery  Procedures:  None   Antibiotics:   Anti-infectives (From admission, onward)   Start     Dose/Rate Route Frequency Ordered Stop   07/04/18 1400  amoxicillin-clavulanate (AUGMENTIN) 875-125 MG per tablet 1 tablet     1 tablet Oral Every 12 hours 07/04/18 1332 07/09/18 2359   07/04/18 1300  cephALEXin (KEFLEX) capsule 500 mg  Status:  Discontinued     500 mg Oral Every 12 hours 07/04/18 1212 07/04/18 1332   06/30/18 1300  piperacillin-tazobactam (ZOSYN) IVPB 3.375 g  Status:  Discontinued     3.375 g 12.5 mL/hr over 240 Minutes Intravenous Every 8 hours 06/30/18 1242 07/04/18 1212   06/29/18 0900  vancomycin (VANCOCIN) 1,500 mg in sodium chloride 0.9 % 500 mL IVPB  Status:  Discontinued     1,500 mg 250  mL/hr over 120 Minutes Intravenous Every 48 hours 06/29/18 0805 07/02/18 1516   06/28/18 1055  vancomycin variable dose per unstable renal function (pharmacist dosing)  Status:  Discontinued      Does not apply See admin instructions 06/28/18 1055 06/29/18 0805   06/27/18 2345  piperacillin-tazobactam (ZOSYN) IVPB  2.25 g  Status:  Discontinued     2.25 g 100 mL/hr over 30 Minutes Intravenous Every 8 hours 06/27/18 2336 06/30/18 1242   06/27/18 2315  vancomycin (VANCOCIN) IVPB 750 mg/150 ml premix  Status:  Discontinued    Note to Pharmacy:  Pharmacy to dose and monitor levels as appropriate for necrotizing fascitis   750 mg 150 mL/hr over 60 Minutes Intravenous Every 12 hours 06/27/18 2303 06/27/18 2329   06/27/18 2315  clindamycin (CLEOCIN) IVPB 600 mg  Status:  Discontinued     600 mg 100 mL/hr over 30 Minutes Intravenous Every 8 hours 06/27/18 2309 07/02/18 1437   06/27/18 1215  vancomycin (VANCOCIN) 1,500 mg in sodium chloride 0.9 % 500 mL IVPB     1,500 mg 250 mL/hr over 120 Minutes Intravenous  Once 06/27/18 1142 06/27/18 1500   06/27/18 1100  metroNIDAZOLE (FLAGYL) IVPB 500 mg  Status:  Discontinued     500 mg 100 mL/hr over 60 Minutes Intravenous Every 8 hours 06/27/18 1052 06/27/18 2303   06/27/18 0200  cefTRIAXone (ROCEPHIN) 1 g in sodium chloride 0.9 % 100 mL IVPB  Status:  Discontinued     1 g 200 mL/hr over 30 Minutes Intravenous Every 24 hours 06/27/18 0138 06/27/18 2258       Objective   Vitals:   07/07/18 0220 07/07/18 0514 07/07/18 0900 07/07/18 1228  BP: 105/72  130/85 (!) 141/67  Pulse: 67 66 67 67  Resp: (!) 30 (!) 28 (!) 27 (!) 31  Temp: 98.1 F (36.7 C) (!) 97.4 F (36.3 C) 98.2 F (36.8 C) 98.4 F (36.9 C)  TempSrc: Oral Oral Oral Oral  SpO2: 98% 97% 95% 95%  Weight:  115.7 kg    Height:        Intake/Output Summary (Last 24 hours) at 07/07/2018 1231 Last data filed at 07/07/2018 1227 Gross per 24 hour  Intake 957 ml  Output 1150 ml  Net -193 ml   Filed Weights   07/04/18 0500 07/06/18 0431 07/07/18 0514  Weight: 115.3 kg 114.5 kg 115.7 kg     Physical Examination:    Physical Exam: Eyes: No icterus, extraocular muscles intact  Mouth: Oral mucosa is moist, no lesions on palate,  Neck: Supple, no deformities, masses, or tenderness Lungs: Normal  respiratory effort, bilateral clear to auscultation, no crackles or wheezes.  Heart: Regular rate and rhythm, S1 and S2 normal, no murmurs, rubs auscultated Abdomen: BS normoactive,soft,nondistended,non-tender to palpation,no organomegaly Extremities: No pretibial edema, no erythema, no cyanosis, no clubbing Neuro : Alert and oriented to time, place and person, No focal deficits       Data Reviewed: I have personally reviewed following labs and imaging studies  CBG: No results for input(s): GLUCAP in the last 168 hours.  CBC: Recent Labs  Lab 07/03/18 0447 07/03/18 1557 07/04/18 0318 07/05/18 0628 07/07/18 0309  WBC 20.4* 26.0* 19.8* 16.9* 15.2*  HGB 8.4* 9.4* 8.7* 8.1* 8.3*  HCT 26.0* 30.1* 27.5* 25.2* 26.6*  MCV 94.5 97.4 96.2 95.5 96.7  PLT 111* 125* 128* 125* 126*    Basic Metabolic Panel: Recent Labs  Lab 07/03/18 0447 07/04/18 0318 07/05/18 1610 07/06/18 0405  07/07/18 0309  NA 143 142 142 144 142  K 3.9 4.2 4.0 4.0 4.2  CL 115* 113* 116* 117* 115*  CO2 16* 15* 17* 18* 21*  GLUCOSE 111* 147* 110* 130* 123*  BUN 47* 38* 34* 31* 26*  CREATININE 1.65* 1.58* 1.48* 1.33* 1.17*  CALCIUM 8.1* 8.1* 8.1* 8.2* 8.1*    Recent Results (from the past 240 hour(s))  Fungus Culture With Stain     Status: None (Preliminary result)   Collection Time: 06/27/18  6:01 PM  Result Value Ref Range Status   Fungus Stain Final report  Final    Comment: (NOTE) Performed At: [facility] [place] [address] [place], [place] [id number] Jolene SchimkeNagendra Sanjai MD WG:[id number]:[phone]    Fungus (Mycology) Culture PENDING  Incomplete   Fungal Source ABSCESS  Final    Comment: LEFT BUTTOCKS ON VANC Performed at [facility], [address] [place], [place] [address]   Aerobic/Anaerobic Culture (surgical/deep wound)     Status: None   Collection Time: 06/27/18  6:01 PM  Result Value Ref Range Status   Specimen Description ABSCESS  Final   Special Requests NONE  Final   Gram Stain    Final    NO WBC SEEN MODERATE GRAM POSITIVE COCCI IN PAIRS MODERATE GRAM NEGATIVE COCCOBACILLI Performed at [facility], [address] [place], [place] [address]    Culture   Final    MODERATE BACTEROIDES SPECIES BETA LACTAMASE POSITIVE RARE KLEBSIELLA PNEUMONIAE    Report Status 07/01/2018 FINAL  Final   Organism ID, Bacteria KLEBSIELLA PNEUMONIAE  Final      Susceptibility   Klebsiella pneumoniae - MIC*    AMPICILLIN RESISTANT Resistant     CEFAZOLIN <=4 SENSITIVE Sensitive     CEFEPIME <=1 SENSITIVE Sensitive     CEFTAZIDIME <=1 SENSITIVE Sensitive     CEFTRIAXONE <=1 SENSITIVE Sensitive     CIPROFLOXACIN <=0.25 SENSITIVE Sensitive     GENTAMICIN <=1 SENSITIVE Sensitive     IMIPENEM <=0.25 SENSITIVE Sensitive     TRIMETH/SULFA <=20 SENSITIVE Sensitive     AMPICILLIN/SULBACTAM <=2 SENSITIVE Sensitive     PIP/TAZO <=4 SENSITIVE Sensitive     Extended ESBL NEGATIVE Sensitive     * RARE KLEBSIELLA PNEUMONIAE  Fungus Culture Result     Status: None   Collection Time: 06/27/18  6:01 PM  Result Value Ref Range Status   Result 1 Comment  Final    Comment: (NOTE) KOH/Calcofluor preparation:  no fungus observed. Performed At: [facility]  [address] [place], [place] [id number] Jolene SchimkeNagendra Sanjai MD UU:[id number]:[phone]   Surgical pcr screen     Status: None   Collection Time: 06/29/18  8:56 AM  Result Value Ref Range Status   MRSA, PCR NEGATIVE NEGATIVE Final   Staphylococcus aureus NEGATIVE NEGATIVE Final    Comment: (NOTE) The Xpert SA Assay (FDA approved for NASAL specimens in patients 82 years of age and older), is one component of a comprehensive surveillance program. It is not intended to diagnose infection nor to guide or monitor treatment. Performed at [facility], [address] [place], [place] [address]      Liver Function Tests: No results for input(s): AST, ALT, ALKPHOS, BILITOT, PROT, ALBUMIN in the last 168 hours. No results for  input(s): LIPASE, AMYLASE in the last 168 hours. No results for input(s): AMMONIA in the last 168 hours.  Cardiac Enzymes: No results for input(s): CKTOTAL, CKMB, CKMBINDEX, TROPONINI in the last 168  hours. BNP (last 3 results) No results for input(s): BNP in the last 8760 hours.  ProBNP (last 3 results) No results for input(s): PROBNP in the last 8760 hours.    Studies: No results found.  Scheduled Meds: . allopurinol  100 mg Oral Daily  . amoxicillin-clavulanate  1 tablet Oral Q12H  . collagenase   Topical BID  . enoxaparin (LOVENOX) injection  40 mg Subcutaneous Q24H  . feeding supplement (ENSURE ENLIVE)  237 mL Oral BID BM  . feeding supplement (PRO-STAT SUGAR FREE 64)  30 mL Oral BID  . levothyroxine  137 mcg Oral QAC breakfast  . sertraline  50 mg Oral Daily      Time spent: 25 min  Meredeth Ide   [facility] Pager [phone]. If 7PM-7AM, please contact night-coverage at [email or web address], Office  [phone]  password [email or web address]  07/07/2018, 12:31 PM  LOS: 11 days

## 2018-07-07 NOTE — Progress Notes (Signed)
Patient feels that she is urinating from urethra.  She c/o burning during urination from urethra.   Wound care nurse's dressing became saturated with urine and was changed per order.

## 2018-07-07 NOTE — Progress Notes (Signed)
Physical Therapy Wound Treatment Patient Details  Name: [name] MRN: [id number] Date of Birth: [birth year]  Today's Date: 07/07/2018 Time: 5093-2671 Time Calculation (min): 74 min  Subjective  Subjective: Pt lethargic and not very talkative during session.  Patient and Family Stated Goals: "Go to rehab" (talking about CIR) Date of Onset: (Unsure) Prior Treatments: Surgical debridement 06/27/18 and 06/29/18. On 8/6 pt underwent diverting colostomy and SP catheter placement.  Pain Score:   Oral and IV pain medication given prior to pulsed lavage treatment, however pt with groaning during procedure.  Wound Assessment  Pressure Injury 06/27/18 Unstageable - Full thickness tissue loss in which the base of the ulcer is covered by slough (yellow, tan, gray, green or brown) and/or eschar (tan, brown or black) in the wound bed. Three areas of black eschar surrounded by blan (Active)  Wound Image   07/05/2018  1:35 PM  Dressing Type Barrier Film (skin prep);ABD;Gauze (Comment);Moist to dry;Montgomery straps 07/07/2018 10:00 AM  Dressing Changed;Clean;Dry;Intact 07/07/2018 10:00 AM  Dressing Change Frequency Daily 07/07/2018 10:00 AM  State of Healing Early/partial granulation 07/07/2018 10:00 AM  Site / Wound Assessment Pink;Red;Yellow;Granulation tissue;Bleeding;Clean 07/07/2018 10:00 AM  % Wound base Red or Granulating 75% 07/07/2018 10:00 AM  % Wound base Yellow/Fibrinous Exudate 25% 07/07/2018 10:00 AM  % Wound base Black/Eschar 0% 07/07/2018 10:00 AM  % Wound base Other/Granulation Tissue (Comment) 0% 07/07/2018 10:00 AM  Peri-wound Assessment Intact;Erythema (blanchable);Pink;Other (Comment) 07/07/2018 10:00 AM  Wound Length (cm) 16.2 cm 07/02/2018 12:00 PM  Wound Width (cm) 15 cm 07/02/2018 12:00 PM  Wound Depth (cm) 5 cm 07/02/2018 12:00 PM  Wound Surface Area (cm^2) 243 cm^2 07/02/2018 12:00 PM  Wound Volume (cm^3) 1215 cm^3 07/02/2018 12:00 PM  Tunneling (cm) 5.2 in center of wound 07/02/2018 12:00 PM    Undermining (cm) 2.1 cm 6-9:00, 3.9 cm 9-10:00, 3 cm 10-12:00 07/02/2018 12:00 PM  Margins Unattached edges (unapproximated) 07/07/2018 10:00 AM  Drainage Amount Minimal 07/07/2018 10:00 AM  Drainage Description Serosanguineous 07/07/2018 10:00 AM  Treatment Debridement (Selective);Hydrotherapy (Pulse lavage);Off loading;Packing (Saline gauze) 07/07/2018 10:00 AM   Santyl applied to necrotic tissue   Hydrotherapy Pulsed lavage therapy - wound location: Buttock Pulsed Lavage with Suction (psi): 12 psi Pulsed Lavage with Suction - Normal Saline Used: 1000 mL Pulsed Lavage Tip: Tip with splash shield Selective Debridement Selective Debridement - Location: Buttock Selective Debridement - Tools Used: Forceps;Scissors Selective Debridement - Tissue Removed: Yellow slough   Wound Assessment and Plan  Wound Therapy - Assess/Plan/Recommendations Wound Therapy - Clinical Statement: Montgomery straps not in place on entry. Nurse stated that they were not in place because they had become soiled with urine. ABD pad were taped in place and additional small blisters on superior periwound were observed. RN educated on need to replace Montgomery straps in future to protect pt skin. Montgomery straps reapplied this session. Pt will benefit from continued hydrotherapy for selective removal of unviable tissue, to decrease bioburden and promote wound bed healing. Wound Therapy - Functional Problem List: Decreased tolerance for OOB mobility/position changes Factors Delaying/Impairing Wound Healing: Immobility;Multiple medical problems;Altered sensation Hydrotherapy Plan: Debridement;Dressing change;Patient/family education;Pulsatile lavage with suction Wound Therapy - Frequency: 6X / week Wound Therapy - Follow Up Recommendations: Other (comment)(CIR) Wound Plan: See above  Wound Therapy Goals- Improve the function of patient's integumentary system by progressing the wound(s) through the phases of wound healing  (inflammation - proliferation - remodeling) by: Decrease Necrotic Tissue to: 20% Decrease Necrotic Tissue - Progress: Progressing toward goal Increase  Granulation Tissue to: 80% Increase Granulation Tissue - Progress: Progressing toward goal Goals/treatment plan/discharge plan were made with and agreed upon by patient/family: Yes Time For Goal Achievement: 7 days Wound Therapy - Potential for Goals: Good  Goals will be updated until maximal potential achieved or discharge criteria met.  Discharge criteria: when goals achieved, discharge from hospital, MD decision/surgical intervention, no progress towards goals, refusal/missing three consecutive treatments without notification or medical reason.  GP    Dani Gobble. Migdalia Dk PT, DPT Acute Rehabilitation  [phone] Pager [phone]   [place] 07/07/2018, 10:52 AM

## 2018-07-07 NOTE — Plan of Care (Signed)
Care plans reviewed and patient is progressing.  

## 2018-07-08 DIAGNOSIS — I5033 Acute on chronic diastolic (congestive) heart failure: Secondary | ICD-10-CM

## 2018-07-08 LAB — BASIC METABOLIC PANEL
Anion gap: 6 (ref 5–15)
BUN: 23 mg/dL (ref 8–23)
CALCIUM: 7.8 mg/dL — AB (ref 8.9–10.3)
CHLORIDE: 112 mmol/L — AB (ref 98–111)
CO2: 23 mmol/L (ref 22–32)
CREATININE: 1.05 mg/dL — AB (ref 0.44–1.00)
GFR calc non Af Amer: 46 mL/min — ABNORMAL LOW (ref >60)
GFR, EST AFRICAN AMERICAN: 53 mL/min — AB (ref >60)
Glucose, Bld: 114 mg/dL — ABNORMAL HIGH (ref 70–99)
Potassium: 3.9 mmol/L (ref 3.5–5.1)
Sodium: 141 mmol/L (ref 135–145)

## 2018-07-08 LAB — URINE CULTURE
Culture: NO GROWTH
Special Requests: NORMAL

## 2018-07-08 MED ORDER — SODIUM CHLORIDE 0.9% FLUSH: 10.0000 mL | INTRAVENOUS | Status: DC | PRN

## 2018-07-08 MED ORDER — FUROSEMIDE 10 MG/ML IJ SOLN
20.0000 mg | Freq: Two times a day (BID) | INTRAMUSCULAR | Status: DC
  Administered 2018-07-08 – 2018-07-11 (×6): 20 mg via INTRAVENOUS
  Filled 2018-07-08 (×6): qty 2

## 2018-07-08 MED ORDER — FUROSEMIDE 40 MG PO TABS
40.0000 mg | ORAL_TABLET | Freq: Every day | ORAL | Status: DC
  Administered 2018-07-08 – 2018-07-11 (×4): 40 mg via ORAL
  Filled 2018-07-08 (×4): qty 1

## 2018-07-08 MED ORDER — SODIUM CHLORIDE 0.9% FLUSH: 10.0000 mL | Freq: Two times a day (BID) | INTRAVENOUS | Status: DC

## 2018-07-08 NOTE — Progress Notes (Signed)
[facility]  PROGRESS NOTE  [name] ZOX:[id number] DOB: [birth year] DOA: 06/26/2018 PCP: Kirby Funk, MD   Brief HPI:   84 year old female with a history of hypothyroidism came with 1 week history of buttock pain, malaise, diarrhea and weakness.  She went to urgent care and she was told that she was dehydrated.  She came to the ER was found to have BP 80/40, leukocytosis, creatinine 5.5, lactic acidosis.  Patient improved after she was given fluid boluses.  Started on ceftriaxone for presumed UTI.  CT abdomen showed enteritis, buttock cellulitis.  Examination of buttock showed cellulitis with necrosis.  Patient admitted for sepsis/Fournier's gangrene.  Started on empiric antibiotics.  Surgery was consulted for debridement.    Subjective   Patient seen and examined, became short of breath yesterday give 1 dose of Lasix with excellent diuresis.  Patient is +11 L fluid overload.  She was taking Lasix 40 mg every other day at home.   Assessment/Plan:     1. Sepsis from Fournier's gangrene-resolved, status post debridement of left gluteal necrotizing soft tissue infection by Dr. Cliffton Asters on 06/27/2018.  Status post debridement of large sacral, perineal wound by Dr. Luisa Hart on 06/29/2018, status post diverting colostomy placement and status post catheter placement 07/03/2018.  Wound culture grew Klebsiella pneumoniae sensitive to cefazolin.  Patient was started on Augmentin yesterday.  WBC is down to 15,000.  Follow CBC in a.m.  IV Zosyn was discontinued.    Blood cultures x2 remain negative to date.  Fungus culture is still pending.  Continue hydrotherapy, dressing changes as per surgery recommendations. 2. Acute on chronic grade 2 diastolic dysfunction-will start Lasix 20 mg IV every 12 hours.  Strict intake and output.  Follow BMP in am 3. UTI-urine culture from 06/27/2018 showed Enterobacter, sensitive to Zosyn.  Patient is currently on Augmentin.  Total 10 days of therapy, starting from  06/29/18.  Patient is already on antibiotics.  Complains of dysuria.  Recheck UA and culture has been ordered and is currently pending. 4. Acute kidney injury-patient's baseline creatinine is 1.4-1.5, she came in with creatinine of 5.5.  Creatinine is slowly improving.   Today creatinine is 1.17 at baseline.   Lasix has been restarted.  Will follow BMP in am. 5. Chronic normocytic anemia-patient's baseline hemoglobin is around 10-11.  FOBT negative.  There was low suspicion for GI bleed, gastroenterology was consulted for enteritis but at that time patient was found to have buttock cellulitis and abscess so GI signed off.  Hemoglobin stable at 8.1.  Closely monitor patient's hemoglobin and transfuse for hemoglobin less than 7. 6. Hypothyroidism-continue Synthroid 7. History of hypertension-patient was hypotensive at the time of admission.  Currently blood pressure remains stable.  Currently Lasix and ARB on hold 8. Enteritis-nonspecific finding seen on CT scan. No diarrhea,  monitor stools.    DVT prophylaxis: SCDs  Code Status: DNR  Family Communication: Discussed with patient's family at bedside  Disposition Plan: likely home vs CIR   Consultants:  GI  Nephrology  General surgery  Procedures:  None   Antibiotics:   Anti-infectives (From admission, onward)   Start     Dose/Rate Route Frequency Ordered Stop   07/04/18 1400  amoxicillin-clavulanate (AUGMENTIN) 875-125 MG per tablet 1 tablet     1 tablet Oral Every 12 hours 07/04/18 1332 07/09/18 2359   07/04/18 1300  cephALEXin (KEFLEX) capsule 500 mg  Status:  Discontinued     500 mg Oral Every 12 hours 07/04/18 1212 07/04/18  1332   06/30/18 1300  piperacillin-tazobactam (ZOSYN) IVPB 3.375 g  Status:  Discontinued     3.375 g 12.5 mL/hr over 240 Minutes Intravenous Every 8 hours 06/30/18 1242 07/04/18 1212   06/29/18 0900  vancomycin (VANCOCIN) 1,500 mg in sodium chloride 0.9 % 500 mL IVPB  Status:  Discontinued     1,500  mg 250 mL/hr over 120 Minutes Intravenous Every 48 hours 06/29/18 0805 07/02/18 1516   06/28/18 1055  vancomycin variable dose per unstable renal function (pharmacist dosing)  Status:  Discontinued      Does not apply See admin instructions 06/28/18 1055 06/29/18 0805   06/27/18 2345  piperacillin-tazobactam (ZOSYN) IVPB 2.25 g  Status:  Discontinued     2.25 g 100 mL/hr over 30 Minutes Intravenous Every 8 hours 06/27/18 2336 06/30/18 1242   06/27/18 2315  vancomycin (VANCOCIN) IVPB 750 mg/150 ml premix  Status:  Discontinued    Note to Pharmacy:  Pharmacy to dose and monitor levels as appropriate for necrotizing fascitis   750 mg 150 mL/hr over 60 Minutes Intravenous Every 12 hours 06/27/18 2303 06/27/18 2329   06/27/18 2315  clindamycin (CLEOCIN) IVPB 600 mg  Status:  Discontinued     600 mg 100 mL/hr over 30 Minutes Intravenous Every 8 hours 06/27/18 2309 07/02/18 1437   06/27/18 1215  vancomycin (VANCOCIN) 1,500 mg in sodium chloride 0.9 % 500 mL IVPB     1,500 mg 250 mL/hr over 120 Minutes Intravenous  Once 06/27/18 1142 06/27/18 1500   06/27/18 1100  metroNIDAZOLE (FLAGYL) IVPB 500 mg  Status:  Discontinued     500 mg 100 mL/hr over 60 Minutes Intravenous Every 8 hours 06/27/18 1052 06/27/18 2303   06/27/18 0200  cefTRIAXone (ROCEPHIN) 1 g in sodium chloride 0.9 % 100 mL IVPB  Status:  Discontinued     1 g 200 mL/hr over 30 Minutes Intravenous Every 24 hours 06/27/18 0138 06/27/18 2258       Objective   Vitals:   07/08/18 0342 07/08/18 0412 07/08/18 0800 07/08/18 1153  BP: (!) 126/57  (!) 119/55   Pulse: 72 71 71   Resp: (!) 23 (!) 31 (!) 33   Temp: 98.3 F (36.8 C)  98.1 F (36.7 C) 99.3 F (37.4 C)  TempSrc: Oral  Oral   SpO2: 94% 93% 91%   Weight:  111.9 kg    Height:        Intake/Output Summary (Last 24 hours) at 07/08/2018 1215 Last data filed at 07/08/2018 0830 Gross per 24 hour  Intake 600 ml  Output 3325 ml  Net -2725 ml   Filed Weights   07/06/18 0431  07/07/18 0514 07/08/18 0412  Weight: 114.5 kg 115.7 kg 111.9 kg     Physical Examination:     General: Appears in mild respiratory distress, tachypneic  Cardiovascular: S1-S2, regular  Respiratory: Decreased breath sounds bilaterally at lung bases  Abdomen: Soft, nontender, no organomegaly  Musculoskeletal: Trace edema of the lower extremities       Data Reviewed: I have personally reviewed following labs and imaging studies  CBG: No results for input(s): GLUCAP in the last 168 hours.  CBC: Recent Labs  Lab 07/03/18 0447 07/03/18 1557 07/04/18 0318 07/05/18 0628 07/07/18 0309  WBC 20.4* 26.0* 19.8* 16.9* 15.2*  HGB 8.4* 9.4* 8.7* 8.1* 8.3*  HCT 26.0* 30.1* 27.5* 25.2* 26.6*  MCV 94.5 97.4 96.2 95.5 96.7  PLT 111* 125* 128* 125* 126*    Basic Metabolic Panel:  Recent Labs  Lab 07/04/18 0318 07/05/18 0628 07/06/18 0405 07/07/18 0309 07/08/18 0634  NA 142 142 144 142 141  K 4.2 4.0 4.0 4.2 3.9  CL 113* 116* 117* 115* 112*  CO2 15* 17* 18* 21* 23  GLUCOSE 147* 110* 130* 123* 114*  BUN 38* 34* 31* 26* 23  CREATININE 1.58* 1.48* 1.33* 1.17* 1.05*  CALCIUM 8.1* 8.1* 8.2* 8.1* 7.8*    Recent Results (from the past 240 hour(s))  Surgical pcr screen     Status: None   Collection Time: 06/29/18  8:56 AM  Result Value Ref Range Status   MRSA, PCR NEGATIVE NEGATIVE Final   Staphylococcus aureus NEGATIVE NEGATIVE Final    Comment: (NOTE) The Xpert SA Assay (FDA approved for NASAL specimens in patients 82 years of age and older), is one component of a comprehensive surveillance program. It is not intended to diagnose infection nor to guide or monitor treatment. Performed at [facility], [address] [place], [place] [address]   Culture, Urine     Status: None   Collection Time: 07/07/18  8:53 AM  Result Value Ref Range Status   Specimen Description URINE, SUPRAPUBIC  Final   Special Requests Normal  Final   Culture   Final    NO  GROWTH Performed at [facility], [address] [place], [place] [address]    Report Status 07/08/2018 FINAL  Final     Liver Function Tests: No results for input(s): AST, ALT, ALKPHOS, BILITOT, PROT, ALBUMIN in the last 168 hours. No results for input(s): LIPASE, AMYLASE in the last 168 hours. No results for input(s): AMMONIA in the last 168 hours.  Cardiac Enzymes: No results for input(s): CKTOTAL, CKMB, CKMBINDEX, TROPONINI in the last 168 hours. BNP (last 3 results) No results for input(s): BNP in the last 8760 hours.  ProBNP (last 3 results) No results for input(s): PROBNP in the last 8760 hours.    Studies: No results found.  Scheduled Meds: . allopurinol  100 mg Oral Daily  . amoxicillin-clavulanate  1 tablet Oral Q12H  . collagenase   Topical BID  . enoxaparin (LOVENOX) injection  40 mg Subcutaneous Q24H  . feeding supplement (ENSURE ENLIVE)  237 mL Oral BID BM  . feeding supplement (PRO-STAT SUGAR FREE 64)  30 mL Oral BID  . furosemide  20 mg Intravenous Q12H  . furosemide  40 mg Oral Daily  . levothyroxine  137 mcg Oral QAC breakfast  . sertraline  50 mg Oral Daily      Time spent: 25 min  Meredeth IdeGagan S Lorene Samaan   [facility] Pager [phone]. If 7PM-7AM, please contact night-coverage at [email or web address], Office  [phone]  password [email or web address]  07/08/2018, 12:15 PM  LOS: 12 days

## 2018-07-08 NOTE — Plan of Care (Signed)
Care plans reviewed and patient is progressing.  

## 2018-07-09 ENCOUNTER — Inpatient Hospital Stay (HOSPITAL_COMMUNITY): Payer: Medicare Other

## 2018-07-09 LAB — BASIC METABOLIC PANEL
Anion gap: 9 (ref 5–15)
BUN: 25 mg/dL — ABNORMAL HIGH (ref 8–23)
CHLORIDE: 107 mmol/L (ref 98–111)
CO2: 25 mmol/L (ref 22–32)
CREATININE: 1.08 mg/dL — AB (ref 0.44–1.00)
Calcium: 8.1 mg/dL — ABNORMAL LOW (ref 8.9–10.3)
GFR calc Af Amer: 52 mL/min — ABNORMAL LOW (ref >60)
GFR calc non Af Amer: 44 mL/min — ABNORMAL LOW (ref >60)
Glucose, Bld: 124 mg/dL — ABNORMAL HIGH (ref 70–99)
POTASSIUM: 4 mmol/L (ref 3.5–5.1)
Sodium: 141 mmol/L (ref 135–145)

## 2018-07-09 MED ORDER — ZINC OXIDE 40 % EX OINT
TOPICAL_OINTMENT | Freq: Two times a day (BID) | CUTANEOUS | Status: DC
  Administered 2018-07-09: 17:00:00 via TOPICAL
  Administered 2018-07-09: 1 via TOPICAL
  Administered 2018-07-10 – 2018-07-11 (×4): via TOPICAL
  Filled 2018-07-09: qty 57

## 2018-07-09 NOTE — Progress Notes (Signed)
   Issues with urine leaking around suprapubic cath is making dressing this wound difficult. Recommend asking urology to look at this cath to see what can be done. Would not try to place a vac on this wound at this time. There is an area of tracking near the rectum with scant purulent drainage. Otherwise wound looks clean with good base of granulation tissues. Continue wet to dry dressing changes and hydrotherapy.   Rebecca MarlinJessica Linkon Siverson, [facility] Pager [phone]

## 2018-07-09 NOTE — Progress Notes (Signed)
Urine cont. To leak around supra pubic cath site at times large amts. Site has suture and is sl. pink

## 2018-07-09 NOTE — Progress Notes (Signed)
Physical Therapy Wound Treatment Patient Details  Name: [name] MRN: [id number] Date of Birth: [birth year]  Today's Date: 07/09/2018 Time: 6599-3570 Time Calculation (min): 97 min  Subjective  Subjective: Pt lethargic and not very talkative during session.  Patient and Family Stated Goals: "Get her functioning before she is discharged home" Per daughter Date of Onset: (unsure) Prior Treatments: Surgical debridement 06/27/18 and 06/29/18. On 8/6 pt underwent diverting colostomy and SP catheter placement.  Pain Score:    Wound Assessment  Pressure Injury 06/27/18 Unstageable - Full thickness tissue loss in which the base of the ulcer is covered by slough (yellow, tan, gray, green or brown) and/or eschar (tan, brown or black) in the wound bed. Three areas of black eschar surrounded by blan (Active)  Wound Image    07/09/2018 12:30 PM  Dressing Type ABD;Barrier Film (skin prep);Gauze (Comment);Moist to dry 07/09/2018 12:30 PM  Dressing Clean;Dry;Intact 07/09/2018 12:30 PM  Dressing Change Frequency Daily 07/09/2018 12:30 PM  State of Healing Early/partial granulation 07/09/2018 12:30 PM  Site / Wound Assessment Pink;Red;Yellow 07/09/2018 12:30 PM  % Wound base Red or Granulating 80% 07/09/2018 12:30 PM  % Wound base Yellow/Fibrinous Exudate 20% 07/09/2018 12:30 PM  % Wound base Black/Eschar 0% 07/09/2018 12:30 PM  % Wound base Other/Granulation Tissue (Comment) 0% 07/09/2018 12:30 PM  Peri-wound Assessment Intact;Erythema (blanchable) 07/09/2018 12:30 PM  Wound Length (cm) 17.2 cm 07/09/2018 10:23 AM  Wound Width (cm) 15.9 cm 07/09/2018 10:23 AM  Wound Depth (cm) 3 cm 07/09/2018 10:23 AM  Wound Surface Area (cm^2) 273.48 cm^2 07/09/2018 10:23 AM  Wound Volume (cm^3) 820.44 cm^3 07/09/2018 10:23 AM  Tunneling (cm) 7.4 cm tunnel in center of wound 07/09/2018 12:30 PM  Undermining (cm) 5.2 cm 9-11:00; 1.6 cm 6-8:00; 1.5 cm 11-12:00 07/09/2018 12:30 PM  Margins Unattached edges (unapproximated) 07/09/2018  12:30 PM  Drainage Amount Moderate 07/09/2018 12:30 PM  Drainage Description Serosanguineous 07/09/2018 12:30 PM  Treatment Debridement (Selective);Hydrotherapy (Pulse lavage);Packing (Saline gauze) 07/09/2018 12:30 PM   Santyl applied to wound bed prior to applying dressing.    Hydrotherapy Pulsed lavage therapy - wound location: Buttock Pulsed Lavage with Suction (psi): 12 psi Pulsed Lavage with Suction - Normal Saline Used: 1000 mL Pulsed Lavage Tip: Tip with splash shield Selective Debridement Selective Debridement - Location: Buttock Selective Debridement - Tools Used: Forceps;Scissors Selective Debridement - Tissue Removed: Yellow slough   Wound Assessment and Plan  Wound Therapy - Assess/Plan/Recommendations Wound Therapy - Clinical Statement: Montgomery straps in place but soiled with urine upon entry. Discussed with surgical PA and decided on ABD with paper tape as best option to maintain skin integrity at this time. Pt will benefit from continued hydrotherapy for selective removal of unviable tissue, to decrease bioburden and promote wound bed healing. Wound Therapy - Functional Problem List: Decreased tolerance for OOB mobility/position changes Factors Delaying/Impairing Wound Healing: Immobility;Multiple medical problems;Altered sensation Hydrotherapy Plan: Debridement;Dressing change;Patient/family education;Pulsatile lavage with suction Wound Therapy - Frequency: 6X / week Wound Therapy - Follow Up Recommendations: Other (comment)(CIR) Wound Plan: See above  Wound Therapy Goals- Improve the function of patient's integumentary system by progressing the wound(s) through the phases of wound healing (inflammation - proliferation - remodeling) by: Decrease Necrotic Tissue to: 0% Decrease Necrotic Tissue - Progress: Updated due to goal met Increase Granulation Tissue to: 100% Increase Granulation Tissue - Progress: Updated due to goal met Goals/treatment plan/discharge plan were  made with and agreed upon by patient/family: Yes Time For Goal Achievement: 7 days Wound Therapy -  Potential for Goals: Good  Goals will be updated until maximal potential achieved or discharge criteria met.  Discharge criteria: when goals achieved, discharge from hospital, MD decision/surgical intervention, no progress towards goals, refusal/missing three consecutive treatments without notification or medical reason.  GP     Thelma Comp 07/09/2018, 12:41 PM   Rolinda Roan, PT, DPT Acute Rehabilitation Services Pager: [phone]

## 2018-07-09 NOTE — Progress Notes (Signed)
Inpatient Rehabilitation-Admissions Coordinator   [facility] and therapy tolerance for [facility]. AC noted surgery note from 8/12 that recommends Urology look at cath site to see what can be done. AC will await plan and stability prior to admit to CIR.   Please call if questions.   Nanine MeansKelly Cindie Rajagopalan, OTR/L  Rehab Admissions Coordinator  [phone] 07/09/2018 2:56 PM

## 2018-07-09 NOTE — Progress Notes (Signed)
[facility]/Trauma Progress Note  6 Days Post-Op   Assessment/Plan AKI on CKD stage III  Acute onChronic normocytic anemia - per primary service Hypothyroidism - home meds HTN  Obesity - BMI 38.52 OSA Enterobacter cloacae UTI - on zosyn   Sepsis- likely 2/2below, improving L buttockFournier's Gangrene - S/P excisional debridement of L gluteal necrotizing soft tissue infection, Dr. Cliffton AstersWhite, 07/31 - S/PDebridement of large sacral perineal and sacral wound, Dr. Luisa Hartornett, 08/02 - s/p diverting loop colostomy and SP tube placement 07/03/18 Dr. Derrell Lollingamirez and Dr. Alvester MorinBell - cxsshow klebsiella pneumoniae - continue hydrotherapy  - BID dressing changes with prn changes when soiled.  FEN:HH diet, SLIV VTE: SCDs ID: vanc 7/31>8/5,cleocin7/31>8/5; zosyn 7/31-08/12 Foley: present Follow up:TBD  DISPO:Will examine wound with hydrotherapy today. Continue current care. Pt is okay for discharge to CIR from a surgical standpoint.    LOS: 13 days    Subjective: CC: nausea  No vomiting. Tolerating diet. Cannot get comfortable in the bed. Daughter at bedside. Asking about wound vac. I discussed that I will see wound today during hydrotherapy and decide then.   Objective: Vital signs in last 24 hours: Temp:  [97.7 F (36.5 C)-99.3 F (37.4 C)] 98 F (36.7 C) (08/12 0432) Pulse Rate:  [71-76] 73 (08/12 0432) Resp:  [19-25] 24 (08/12 0432) BP: (110-120)/(52-65) 110/60 (08/12 0432) SpO2:  [93 %-99 %] 97 % (08/12 0432) Weight:  [107.2 kg] 107.2 kg (08/12 0522) Last BM Date: 07/08/18  Intake/Output from previous day: 08/11 0701 - 08/12 0700 In: 780 [P.O.:780] Out: 3027 [Urine:2627; Stool:400] Intake/Output this shift: No intake/output data recorded.  PE: Gen:  Alert, NAD, pleasant, cooperative Pulm:  Rate and effort normal Abd: Soft, NT/ND, +BS, incisions C/D/I, ostomy with liquid brown stool in bag Skin: no rashes noted, warm and  dry   Anti-infectives: Anti-infectives (From admission, onward)   Start     Dose/Rate Route Frequency Ordered Stop   07/04/18 1400  amoxicillin-clavulanate (AUGMENTIN) 875-125 MG per tablet 1 tablet     1 tablet Oral Every 12 hours 07/04/18 1332 07/09/18 2359   07/04/18 1300  cephALEXin (KEFLEX) capsule 500 mg  Status:  Discontinued     500 mg Oral Every 12 hours 07/04/18 1212 07/04/18 1332   06/30/18 1300  piperacillin-tazobactam (ZOSYN) IVPB 3.375 g  Status:  Discontinued     3.375 g 12.5 mL/hr over 240 Minutes Intravenous Every 8 hours 06/30/18 1242 07/04/18 1212   06/29/18 0900  vancomycin (VANCOCIN) 1,500 mg in sodium chloride 0.9 % 500 mL IVPB  Status:  Discontinued     1,500 mg 250 mL/hr over 120 Minutes Intravenous Every 48 hours 06/29/18 0805 07/02/18 1516   06/28/18 1055  vancomycin variable dose per unstable renal function (pharmacist dosing)  Status:  Discontinued      Does not apply See admin instructions 06/28/18 1055 06/29/18 0805   06/27/18 2345  piperacillin-tazobactam (ZOSYN) IVPB 2.25 g  Status:  Discontinued     2.25 g 100 mL/hr over 30 Minutes Intravenous Every 8 hours 06/27/18 2336 06/30/18 1242   06/27/18 2315  vancomycin (VANCOCIN) IVPB 750 mg/150 ml premix  Status:  Discontinued    Note to Pharmacy:  Pharmacy to dose and monitor levels as appropriate for necrotizing fascitis   750 mg 150 mL/hr over 60 Minutes Intravenous Every 12 hours 06/27/18 2303 06/27/18 2329   06/27/18 2315  clindamycin (CLEOCIN) IVPB 600 mg  Status:  Discontinued     600 mg 100 mL/hr over 30 Minutes  Intravenous Every 8 hours 06/27/18 2309 07/02/18 1437   06/27/18 1215  vancomycin (VANCOCIN) 1,500 mg in sodium chloride 0.9 % 500 mL IVPB     1,500 mg 250 mL/hr over 120 Minutes Intravenous  Once 06/27/18 1142 06/27/18 1500   06/27/18 1100  metroNIDAZOLE (FLAGYL) IVPB 500 mg  Status:  Discontinued     500 mg 100 mL/hr over 60 Minutes Intravenous Every 8 hours 06/27/18 1052 06/27/18 2303    06/27/18 0200  cefTRIAXone (ROCEPHIN) 1 g in sodium chloride 0.9 % 100 mL IVPB  Status:  Discontinued     1 g 200 mL/hr over 30 Minutes Intravenous Every 24 hours 06/27/18 0138 06/27/18 2258      Lab Results:  Recent Labs    07/07/18 0309  WBC 15.2*  HGB 8.3*  HCT 26.6*  PLT 126*   BMET Recent Labs    07/08/18 0634 07/09/18 0249  NA 141 141  K 3.9 4.0  CL 112* 107  CO2 23 25  GLUCOSE 114* 124*  BUN 23 25*  CREATININE 1.05* 1.08*  CALCIUM 7.8* 8.1*   PT/INR No results for input(s): LABPROT, INR in the last 72 hours. CMP     Component Value Date/Time   NA 141 07/09/2018 0249   K 4.0 07/09/2018 0249   CL 107 07/09/2018 0249   CO2 25 07/09/2018 0249   GLUCOSE 124 (H) 07/09/2018 0249   BUN 25 (H) 07/09/2018 0249   CREATININE 1.08 (H) 07/09/2018 0249   CALCIUM 8.1 (L) 07/09/2018 0249   PROT 5.3 (L) 06/27/2018 0416   ALBUMIN 2.2 (L) 06/27/2018 0416   AST 19 06/27/2018 0416   ALT 12 06/27/2018 0416   ALKPHOS 101 06/27/2018 0416   BILITOT 1.3 (H) 06/27/2018 0416   GFRNONAA 44 (L) 07/09/2018 0249   GFRAA 52 (L) 07/09/2018 0249   Lipase  No results found for: LIPASE  Studies/Results: No results found.    Jerre SimonJessica L Journey [place] , [facility] 07/09/2018, 8:39 AM  Pager: [phone] Mon-Wed, Friday 7:00am-4:30pm Thurs 7am-11:30am  Consults: [phone]

## 2018-07-09 NOTE — Progress Notes (Signed)
Physical Therapy Treatment Patient Details Name: [name] MRN: [id number] DOB: 01/20/1928 Today's Date: 07/09/2018    History of Present Illness Pt is an 84 y/o female with PMH significant for HTN, hypothyroidism who presents with 1 week buttock pain, followed by malaise, diarrhea, and weakness. In ED, pt found to be hypotensive with leukocytosis and lactic acidosis. Pt is now s/p 2 I&D of buttock wound and is undergoing hydrotherapy. Plan for lap colostomy 8/6.    PT Comments    Pt admitted with above diagnosis. Pt currently with functional limitations due to balance and endurance deficits. Pt was able to ambulate with min to mod assist with chair follow and RW for support and incr distance somewhat today.  Will continue acute PT.  Pt will benefit from skilled PT to increase their independence and safety with mobility to allow discharge to the venue listed below.     Follow Up Recommendations  Supervision/Assistance - 24 hour;CIR     Equipment Recommendations  Other (comment)(TBA)    Recommendations for Other Services Rehab consult     Precautions / Restrictions Precautions Precautions: Fall Precaution Comments: colostomy, suprapubic catheter, large buttock wound Restrictions Weight Bearing Restrictions: No    Mobility  Bed Mobility Overal bed mobility: Needs Assistance Bed Mobility: Sidelying to Sit Rolling: Mod assist;+2 for physical assistance   Supine to sit: Mod assist;+2 for physical assistance     General bed mobility comments: assist for legs off bed and to lift trunk with cues (attempted to roll to R, but pt unable to tolerate), to supine assist for trunk and legs(vc for technique to maximize body mechanics)  Transfers Overall transfer level: Needs assistance Equipment used: Rolling walker (2 wheeled) Transfers: Sit to/from Stand Sit to Stand: Max assist;+2 physical assistance;Mod assist         General transfer comment: cues for hand placement, assist  for lifting x 2 trials with momentum(vc for RW management)  Ambulation/Gait Ambulation/Gait assistance: Min assist;+2 physical assistance Gait Distance (Feet): 25 Feet Assistive device: Rolling walker (2 wheeled) Gait Pattern/deviations: Step-to pattern;Shuffle;Decreased stride length;Wide base of support   Gait velocity interpretation: <1.31 ft/sec, indicative of household ambulator General Gait Details: Able to ambulate with RW with cues for technique out to hallway.  Had to have chair follow and sat down once in hallway.    Stairs             Wheelchair Mobility    Modified Rankin (Stroke Patients Only)       Balance Overall balance assessment: Needs assistance Sitting-balance support: Bilateral upper extremity supported;Feet supported Sitting balance-Leahy Scale: Poor Sitting balance - Comments: (pt leaning back, requiring min-mod A to stabilize) Postural control: Posterior lean Standing balance support: Bilateral upper extremity supported Standing balance-Leahy Scale: Poor Standing balance comment: UE support and assist for balance                            Cognition Arousal/Alertness: Awake/alert Behavior During Therapy: Flat affect Overall Cognitive Status: Within Functional Limits for tasks assessed                                        Exercises General Exercises - Lower Extremity Ankle Circles/Pumps: AROM;10 reps;Both;Supine Heel Slides: AROM;AAROM;5 reps;Both;Supine    General Comments        Pertinent Vitals/Pain Pain Assessment: Faces Faces Pain Scale:  Hurts little more Pain Location: R hip Pain Descriptors / Indicators: Sore;Aching Pain Intervention(s): Limited activity within patient's tolerance;Monitored during session;Repositioned;Premedicated before session    Home Living                      Prior Function            PT Goals (current goals can now be found in the care plan section) Acute  Rehab PT Goals Patient Stated Goal: None stated during session Progress towards PT goals: Progressing toward goals    Frequency    Min 3X/week      PT Plan Current plan remains appropriate    Co-evaluation              AM-PAC PT "6 Clicks" Daily Activity  Outcome Measure  Difficulty turning over in bed (including adjusting bedclothes, sheets and blankets)?: A Lot Difficulty moving from lying on back to sitting on the side of the bed? : A Lot Difficulty sitting down on and standing up from a chair with arms (e.g., wheelchair, bedside commode, etc,.)?: A Lot Help needed moving to and from a bed to chair (including a wheelchair)?: A Lot Help needed walking in hospital room?: A Lot Help needed climbing 3-5 steps with a railing? : Total 6 Click Score: 11    End of Session Equipment Utilized During Treatment: Gait belt Activity Tolerance: Patient tolerated treatment well Patient left: with call bell/phone within reach;with family/visitor present;in chair Nurse Communication: Mobility status PT Visit Diagnosis: Unsteadiness on feet (R26.81);Pain;Muscle weakness (generalized) (M62.81) Pain - Right/Left: Right Pain - part of body: Hip     Time: [id number] PT Time Calculation (min) (ACUTE ONLY): 20 min  Charges:  $Gait Training: 8-22 mins                     [facility]Tassie Pollett,PT Acute Rehabilitation [phone] (pager)    Berline Lopesawn F Yvette Loveless 07/09/2018, 12:41 PM

## 2018-07-09 NOTE — Progress Notes (Signed)
Pt has a home machine- family at bedside. Pt has not been wearing CPAP due to it is bothering her.  Family said that they continue to offer it each night. Pt resting comfortably with no distress at time of check.

## 2018-07-09 NOTE — Progress Notes (Addendum)
[facility]  PROGRESS NOTE  Mickie KayGracie B Fassler ZOX:[id number] DOB: 06/20/1928 DOA: 06/26/2018 PCP: Kirby FunkGriffin, John, MD   Brief HPI:   84 year old female with a history of hypothyroidism came with 1 week history of buttock pain, malaise, diarrhea and weakness.  She went to urgent care and she was told that she was dehydrated.  She came to the ER was found to have BP 80/40, leukocytosis, creatinine 5.5, lactic acidosis.  Patient improved after she was given fluid boluses.  Started on ceftriaxone for presumed UTI.  CT abdomen showed enteritis, buttock cellulitis.  Examination of buttock showed cellulitis with necrosis.  Patient admitted for sepsis/Fournier's gangrene.  Started on empiric antibiotics.  Surgery was consulted for debridement.    Subjective   Patient seen and examined, complains of dyspnea this morning.    Assessment/Plan:     1. Sepsis from Fournier's gangrene-resolved, status post debridement of left gluteal necrotizing soft tissue infection by Dr. Cliffton AstersWhite on 06/27/2018.  Status post debridement of large sacral, perineal wound by Dr. Luisa Hartornett on 06/29/2018, status post diverting colostomy placement and status post catheter placement 07/03/2018.  Wound culture grew Klebsiella pneumoniae sensitive to cefazolin.  Patient was started on Augmentin yesterday.  WBC is down to 15,000.  Follow CBC in a.m.  IV Zosyn was discontinued.    Blood cultures x2 remain negative to date.  Fungus culture is still pending.  Continue hydrotherapy, dressing changes as per surgery recommendations. Will consult urology for leaking suprapubic catheter. 2. Acute on chronic grade 2 diastolic dysfunction- improved after starting diuretics,continue Lasix 20 mg IV every 12 hours.  Strict intake and output.  Follow BMP in am. 3. UTI-urine culture from 06/27/2018 showed Enterobacter, sensitive to Zosyn.  Patient is currently on Augmentin.  total 10 days of therapy, starting from 06/29/18.  Patient is already on antibiotics.   Complains of dysuria.  Recheck UA and culture has been ordered and is currently pending. 4. Acute kidney injury-patient's baseline creatinine is 1.4-1.5, she came in with creatinine of 5.5.  Creatinine is slowly improving.   Today creatinine is 1.17 at baseline.   Lasix has been restarted.  Will follow BMP in am. 5. Chronic normocytic anemia-patient's baseline hemoglobin is around 10-11.  FOBT negative.  There was low suspicion for GI bleed, gastroenterology was consulted for enteritis but at that time patient was found to have buttock cellulitis and abscess so GI signed off.  Hemoglobin stable at 8.1.  Closely monitor patient's hemoglobin and transfuse for hemoglobin less than 7. 6. Hypothyroidism-continue Synthroid 7. History of hypertension-patient was hypotensive at the time of admission.  Currently blood pressure remains stable.   8. Enteritis-nonspecific finding seen on CT scan. No diarrhea,  monitor stools.    DVT prophylaxis: SCDs  Code Status: DNR  Family Communication: Discussed with patient's family at bedside  Disposition Plan: likely home vs CIR   Consultants:  GI  Nephrology  General surgery  Procedures:  None   Antibiotics:   Anti-infectives (From admission, onward)   Start     Dose/Rate Route Frequency Ordered Stop   07/04/18 1400  amoxicillin-clavulanate (AUGMENTIN) 875-125 MG per tablet 1 tablet     1 tablet Oral Every 12 hours 07/04/18 1332 07/09/18 2359   07/04/18 1300  cephALEXin (KEFLEX) capsule 500 mg  Status:  Discontinued     500 mg Oral Every 12 hours 07/04/18 1212 07/04/18 1332   06/30/18 1300  piperacillin-tazobactam (ZOSYN) IVPB 3.375 g  Status:  Discontinued     3.375 g  12.5 mL/hr over 240 Minutes Intravenous Every 8 hours 06/30/18 1242 07/04/18 1212   06/29/18 0900  vancomycin (VANCOCIN) 1,500 mg in sodium chloride 0.9 % 500 mL IVPB  Status:  Discontinued     1,500 mg 250 mL/hr over 120 Minutes Intravenous Every 48 hours 06/29/18 0805  07/02/18 1516   06/28/18 1055  vancomycin variable dose per unstable renal function (pharmacist dosing)  Status:  Discontinued      Does not apply See admin instructions 06/28/18 1055 06/29/18 0805   06/27/18 2345  piperacillin-tazobactam (ZOSYN) IVPB 2.25 g  Status:  Discontinued     2.25 g 100 mL/hr over 30 Minutes Intravenous Every 8 hours 06/27/18 2336 06/30/18 1242   06/27/18 2315  vancomycin (VANCOCIN) IVPB 750 mg/150 ml premix  Status:  Discontinued    Note to Pharmacy:  Pharmacy to dose and monitor levels as appropriate for necrotizing fascitis   750 mg 150 mL/hr over 60 Minutes Intravenous Every 12 hours 06/27/18 2303 06/27/18 2329   06/27/18 2315  clindamycin (CLEOCIN) IVPB 600 mg  Status:  Discontinued     600 mg 100 mL/hr over 30 Minutes Intravenous Every 8 hours 06/27/18 2309 07/02/18 1437   06/27/18 1215  vancomycin (VANCOCIN) 1,500 mg in sodium chloride 0.9 % 500 mL IVPB     1,500 mg 250 mL/hr over 120 Minutes Intravenous  Once 06/27/18 1142 06/27/18 1500   06/27/18 1100  metroNIDAZOLE (FLAGYL) IVPB 500 mg  Status:  Discontinued     500 mg 100 mL/hr over 60 Minutes Intravenous Every 8 hours 06/27/18 1052 06/27/18 2303   06/27/18 0200  cefTRIAXone (ROCEPHIN) 1 g in sodium chloride 0.9 % 100 mL IVPB  Status:  Discontinued     1 g 200 mL/hr over 30 Minutes Intravenous Every 24 hours 06/27/18 0138 06/27/18 2258       Objective   Vitals:   07/09/18 0700 07/09/18 0900 07/09/18 1100 07/09/18 1235  BP:    127/62  Pulse: 66 66 69 75  Resp: 17 16 (!) 27 (!) 27  Temp:    98.6 F (37 C)  TempSrc:    Oral  SpO2: 99% 95% 100% 96%  Weight:      Height:        Intake/Output Summary (Last 24 hours) at 07/09/2018 1518 Last data filed at 07/09/2018 1200 Gross per 24 hour  Intake 840 ml  Output 2402 ml  Net -1562 ml   Filed Weights   07/07/18 0514 07/08/18 0412 07/09/18 0522  Weight: 115.7 kg 111.9 kg 107.2 kg     Physical Examination:    Mouth: Oral mucosa is  moist, no lesions on palate,  Neck: Supple, no deformities, masses, or tenderness Lungs: Normal respiratory effort, bilateral clear to auscultation, no crackles or wheezes.  Heart: Regular rate and rhythm, S1 and S2 normal, no murmurs, rubs auscultated Abdomen: BS normoactive,soft,nondistended,non-tender to palpation,no organomegaly Extremities: trace edema of lower extremities Neuro : Alert and oriented to time, place and person, No focal deficits  Skin: No rashes seen on exam       Data Reviewed: I have personally reviewed following labs and imaging studies  CBG: No results for input(s): GLUCAP in the last 168 hours.  CBC: Recent Labs  Lab 07/03/18 0447 07/03/18 1557 07/04/18 0318 07/05/18 0628 07/07/18 0309  WBC 20.4* 26.0* 19.8* 16.9* 15.2*  HGB 8.4* 9.4* 8.7* 8.1* 8.3*  HCT 26.0* 30.1* 27.5* 25.2* 26.6*  MCV 94.5 97.4 96.2 95.5 96.7  PLT 111* 125*  128* 125* 126*    Basic Metabolic Panel: Recent Labs  Lab 07/05/18 0628 07/06/18 0405 07/07/18 0309 07/08/18 0634 07/09/18 0249  NA 142 144 142 141 141  K 4.0 4.0 4.2 3.9 4.0  CL 116* 117* 115* 112* 107  CO2 17* 18* 21* 23 25  GLUCOSE 110* 130* 123* 114* 124*  BUN 34* 31* 26* 23 25*  CREATININE 1.48* 1.33* 1.17* 1.05* 1.08*  CALCIUM 8.1* 8.2* 8.1* 7.8* 8.1*    Recent Results (from the past 240 hour(s))  Culture, Urine     Status: None   Collection Time: 07/07/18  8:53 AM  Result Value Ref Range Status   Specimen Description URINE, SUPRAPUBIC  Final   Special Requests Normal  Final   Culture   Final    NO GROWTH Performed at [facility], [address], [place], [place] [address]    Report Status 07/08/2018 FINAL  Final     Liver Function Tests: No results for input(s): AST, ALT, ALKPHOS, BILITOT, PROT, ALBUMIN in the last 168 hours. No results for input(s): LIPASE, AMYLASE in the last 168 hours. No results for input(s): AMMONIA in the last 168 hours.  Cardiac Enzymes: No results for  input(s): CKTOTAL, CKMB, CKMBINDEX, TROPONINI in the last 168 hours. BNP (last 3 results) No results for input(s): BNP in the last 8760 hours.  ProBNP (last 3 results) No results for input(s): PROBNP in the last 8760 hours.    Studies: No results found.  Scheduled Meds: . allopurinol  100 mg Oral Daily  . amoxicillin-clavulanate  1 tablet Oral Q12H  . collagenase   Topical BID  . enoxaparin (LOVENOX) injection  40 mg Subcutaneous Q24H  . feeding supplement (ENSURE ENLIVE)  237 mL Oral BID BM  . feeding supplement (PRO-STAT SUGAR FREE 64)  30 mL Oral BID  . furosemide  20 mg Intravenous Q12H  . furosemide  40 mg Oral Daily  . levothyroxine  137 mcg Oral QAC breakfast  . liver oil-zinc oxide   Topical BID  . sertraline  50 mg Oral Daily      Time spent: 25 min  Meredeth Ide   [facility] Pager [phone]. If 7PM-7AM, please contact night-coverage at [email or web address], Office  [phone]  password [email or web address]  07/09/2018, 3:18 PM  LOS: 13 days

## 2018-07-09 NOTE — Consult Note (Signed)
WOC Nurse wound consult note [facility] presence of daughter and female family member Reason for Consult: Leaking SP catheter The insertion site of the SP cath is very slightly reddened.  The patient's daughter reports urine has been leaking from the insertion site, especially when the patient is given lasix.  I explained that some leakage from this type of tube insertion site is normal.  Plan for area care is: Gently cleanse around the SP catheter site. Pat dry. Apply Desitin around the tube site.  Place a split gauze dressing around the site.  Change if dressing becomes soiled prior to the next scheduled dressing change. Monitor the wound area(s) for worsening of condition such as: Signs/symptoms of infection,  Increase in size,  Development of or worsening of odor, Development of pain, or increased pain at the affected locations.  Notify the medical team if any of these develop.  Thank you for the consult.  Discussed plan of care with the patient, her family, and bedside nurse.  WOC nurse will not follow at this time.  Please re-consult the WOC team if needed.  Helmut MusterSherry Slayter Moorhouse, RN, MSN, CWOCN, CNS-BC, pager [phone]

## 2018-07-10 MED ORDER — OXYBUTYNIN CHLORIDE 5 MG PO TABS
5.0000 mg | ORAL_TABLET | Freq: Two times a day (BID) | ORAL | Status: DC
  Administered 2018-07-10 – 2018-07-11 (×2): 5 mg via ORAL
  Filled 2018-07-10 (×2): qty 1

## 2018-07-10 NOTE — Progress Notes (Signed)
Nutrition Follow Up  DOCUMENTATION CODES:   Obesity unspecified  INTERVENTION:    Liberalize diet to Regular    Ensure Enlive po BID, each supplement provides 350 kcal and 20 grams of protein  Prostat liquid protein po 30 ml BID with meals, each supplement provides 100 kcal, 15 grams protein  NUTRITION DIAGNOSIS:   Increased nutrient needs related to wound healing as evidenced by estimated needs, ongoing  GOAL:   Patient will meet greater than or equal to 90% of their needs, progressing  MONITOR:   PO intake, Supplement acceptance, Labs, Skin, Weight trends, I & O's  ASSESSMENT:  84 y.o. Female with HTN, hypothyroidism who presented with 1 week buttock pain, followed by malaise, diarrhea, and weakness.     surgical I&D's 7/31 and 8/2  Pt is s/p procedures 8/6: LAPAROSCOPIC DIVERTING LOOP COLOSTOMY   INSERTION OF SUPRAPUBIC CATHETER  Pt's PO intake suboptimal at 25-50% per flowsheet records. Spoke with RN. Pt is taking her Ensure Enlive and Prostat supplements. Continues to receive therapies including hydrotherapy per PT. Labs and medications reviewed. Glucose 124 (H). Disposition: IP Rehab.  Verbal with Read Back order received per Dr. Sharl MaLama to liberalize pt's diet to Regular.  Diet Order:   Diet Order            Diet regular Room service appropriate? Yes; Fluid consistency: Thin  Diet effective now             EDUCATION NEEDS:   Not appropriate for education at this time  Skin:  Skin Assessment: Skin Integrity Issues: Skin Integrity Issues:: Other (Comment) Other: L buttock wound  Last BM:  8/12 - ileostomy  Height:   Ht Readings from Last 1 Encounters:  06/29/18 5\' 4"  (1.626 m)   Weight:   Wt Readings from Last 1 Encounters:  07/09/18 107.2 kg   BMI:  Body mass index is 40.58 kg/m.  Estimated Nutritional Needs:   Kcal:  1800-1950  Protein:  90-105 gm  Fluid:  1.8-1.9 L  Maureen ChattersKatie Brylen Wagar, RD, LDN Pager #: [phone] After-Hours  Pager #: [phone]

## 2018-07-10 NOTE — Progress Notes (Signed)
[facility]  PROGRESS NOTE  [name] ZOX:[id number] DOB: 11/09/1928 DOA: 06/26/2018 PCP: Kirby FunkGriffin, John, MD   Brief HPI:   84 year old female with a history of hypothyroidism came with 1 week history of buttock pain, malaise, diarrhea and weakness.  She went to urgent care and she was told that she was dehydrated.  She came to the ER was found to have BP 80/40, leukocytosis, creatinine 5.5, lactic acidosis.  Patient improved after she was given fluid boluses.  Started on ceftriaxone for presumed UTI.  CT abdomen showed enteritis, buttock cellulitis.  Examination of buttock showed cellulitis with necrosis.  Patient admitted for sepsis/Fournier's gangrene.  Started on empiric antibiotics.  Surgery was consulted for debridement.    Subjective   Patient seen and examined, breathing is improved with diuresis with IV Lasix.  Awaiting to go to CIR.   Assessment/Plan:     1. Sepsis from Fournier's gangrene-resolved, status post debridement of left gluteal necrotizing soft tissue infection by Dr. Cliffton AstersWhite on 06/27/2018.  Status post debridement of large sacral, perineal wound by Dr. Luisa Hartornett on 06/29/2018, status post diverting colostomy placement and status post catheter placement 07/03/2018.  Wound culture grew Klebsiella pneumoniae sensitive to cefazolin.  Patient was started on Augmentin yesterday.  WBC is down to 15,000.  Follow CBC in a.m.  IV Zosyn was discontinued.    Blood cultures x2 remain negative to date.  Fungus culture is still pending.  Continue hydrotherapy, dressing changes as per surgery recommendations.  Urology was consulted for leaking around the suprapubic catheter.  Discussed with nursing staff, no more leaking around suprapubic catheter site. 2. Acute on chronic grade 2 diastolic dysfunction- improved after starting diuretics,continue Lasix 20 mg IV every 12 hours.  Strict intake and output.  Follow BMP in am. 3. UTI-urine culture from 06/27/2018 showed Enterobacter, sensitive to  Zosyn.  Patients antibiotic was changed to Augmentin for total 10 days of therapy stopped on 07/09/2018.  4. Acute kidney injury-patient's baseline creatinine is 1.4-1.5, she came in with creatinine of 5.5.  Creatinine is slowly improving.   Today creatinine is 1.08 at baseline.   Lasix has been restarted.  Will follow BMP in am. 5. Chronic normocytic anemia-patient's baseline hemoglobin is around 10-11.  FOBT negative.  There was low suspicion for GI bleed, gastroenterology was consulted for enteritis but at that time patient was found to have buttock cellulitis and abscess so GI signed off.  Hemoglobin stable at 8.3.  Closely monitor patient's hemoglobin and transfuse for hemoglobin less than 7. 6. Hypothyroidism-continue Synthroid 7. History of hypertension-patient was hypotensive at the time of admission.  Currently blood pressure remains stable.   8. Enteritis-nonspecific finding seen on CT scan. No diarrhea,  monitor stools.    DVT prophylaxis: SCDs  Code Status: DNR  Family Communication: Discussed with patient's family at bedside  Disposition Plan: likely  CIR in 1-2 days   Consultants:  GI  Nephrology  General surgery  Procedures:  None   Antibiotics:   Anti-infectives (From admission, onward)   Start     Dose/Rate Route Frequency Ordered Stop   07/04/18 1400  amoxicillin-clavulanate (AUGMENTIN) 875-125 MG per tablet 1 tablet     1 tablet Oral Every 12 hours 07/04/18 1332 07/09/18 2133   07/04/18 1300  cephALEXin (KEFLEX) capsule 500 mg  Status:  Discontinued     500 mg Oral Every 12 hours 07/04/18 1212 07/04/18 1332   06/30/18 1300  piperacillin-tazobactam (ZOSYN) IVPB 3.375 g  Status:  Discontinued  3.375 g 12.5 mL/hr over 240 Minutes Intravenous Every 8 hours 06/30/18 1242 07/04/18 1212   06/29/18 0900  vancomycin (VANCOCIN) 1,500 mg in sodium chloride 0.9 % 500 mL IVPB  Status:  Discontinued     1,500 mg 250 mL/hr over 120 Minutes Intravenous Every 48 hours  06/29/18 0805 07/02/18 1516   06/28/18 1055  vancomycin variable dose per unstable renal function (pharmacist dosing)  Status:  Discontinued      Does not apply See admin instructions 06/28/18 1055 06/29/18 0805   06/27/18 2345  piperacillin-tazobactam (ZOSYN) IVPB 2.25 g  Status:  Discontinued     2.25 g 100 mL/hr over 30 Minutes Intravenous Every 8 hours 06/27/18 2336 06/30/18 1242   06/27/18 2315  vancomycin (VANCOCIN) IVPB 750 mg/150 ml premix  Status:  Discontinued    Note to Pharmacy:  Pharmacy to dose and monitor levels as appropriate for necrotizing fascitis   750 mg 150 mL/hr over 60 Minutes Intravenous Every 12 hours 06/27/18 2303 06/27/18 2329   06/27/18 2315  clindamycin (CLEOCIN) IVPB 600 mg  Status:  Discontinued     600 mg 100 mL/hr over 30 Minutes Intravenous Every 8 hours 06/27/18 2309 07/02/18 1437   06/27/18 1215  vancomycin (VANCOCIN) 1,500 mg in sodium chloride 0.9 % 500 mL IVPB     1,500 mg 250 mL/hr over 120 Minutes Intravenous  Once 06/27/18 1142 06/27/18 1500   06/27/18 1100  metroNIDAZOLE (FLAGYL) IVPB 500 mg  Status:  Discontinued     500 mg 100 mL/hr over 60 Minutes Intravenous Every 8 hours 06/27/18 1052 06/27/18 2303   06/27/18 0200  cefTRIAXone (ROCEPHIN) 1 g in sodium chloride 0.9 % 100 mL IVPB  Status:  Discontinued     1 g 200 mL/hr over 30 Minutes Intravenous Every 24 hours 06/27/18 0138 06/27/18 2258       Objective   Vitals:   07/10/18 0700 07/10/18 0900 07/10/18 1100 07/10/18 1300  BP:      Pulse: 74 75 75 68  Resp: 13 (!) 24 15 13   Temp:      TempSrc:      SpO2: 96% 95% 97% 94%  Weight:      Height:        Intake/Output Summary (Last 24 hours) at 07/10/2018 1357 Last data filed at 07/10/2018 1200 Gross per 24 hour  Intake 380 ml  Output 1740 ml  Net -1360 ml   Filed Weights   07/07/18 0514 07/08/18 0412 07/09/18 0522  Weight: 115.7 kg 111.9 kg 107.2 kg     Physical Examination:    Eyes: No icterus, extraocular muscles  intact  Mouth: Oral mucosa is dry, no lesions on palate,  Neck: Supple, no deformities, masses, or tenderness Lungs: Normal respiratory effort, bilateral clear to auscultation, no crackles or wheezes.  Heart: Regular rate and rhythm, S1 and S2 normal, no murmurs, rubs auscultated Abdomen: BS normoactive,soft,nondistended,non-tender to palpation,no organomegaly Extremities: Trace pretibial edema Neuro : Alert and oriented to time, place and person, No focal deficits       Data Reviewed: I have personally reviewed following labs and imaging studies  CBG: No results for input(s): GLUCAP in the last 168 hours.  CBC: Recent Labs  Lab 07/03/18 1557 07/04/18 0318 07/05/18 0628 07/07/18 0309  WBC 26.0* 19.8* 16.9* 15.2*  HGB 9.4* 8.7* 8.1* 8.3*  HCT 30.1* 27.5* 25.2* 26.6*  MCV 97.4 96.2 95.5 96.7  PLT 125* 128* 125* 126*    Basic Metabolic Panel: Recent Labs  Lab 07/05/18 0628 07/06/18 0405 07/07/18 0309 07/08/18 0634 07/09/18 0249  NA 142 144 142 141 141  K 4.0 4.0 4.2 3.9 4.0  CL 116* 117* 115* 112* 107  CO2 17* 18* 21* 23 25  GLUCOSE 110* 130* 123* 114* 124*  BUN 34* 31* 26* 23 25*  CREATININE 1.48* 1.33* 1.17* 1.05* 1.08*  CALCIUM 8.1* 8.2* 8.1* 7.8* 8.1*    Recent Results (from the past 240 hour(s))  Culture, Urine     Status: None   Collection Time: 07/07/18  8:53 AM  Result Value Ref Range Status   Specimen Description URINE, SUPRAPUBIC  Final   Special Requests Normal  Final   Culture   Final    NO GROWTH Performed at [facility], [address] [place], [place] [address]    Report Status 07/08/2018 FINAL  Final     Liver Function Tests: No results for input(s): AST, ALT, ALKPHOS, BILITOT, PROT, ALBUMIN in the last 168 hours. No results for input(s): LIPASE, AMYLASE in the last 168 hours. No results for input(s): AMMONIA in the last 168 hours.  Cardiac Enzymes: No results for input(s): CKTOTAL, CKMB, CKMBINDEX, TROPONINI in the last 168  hours. BNP (last 3 results) No results for input(s): BNP in the last 8760 hours.  ProBNP (last 3 results) No results for input(s): PROBNP in the last 8760 hours.    Studies: Dg Hand 2 View Left  Result Date: 07/09/2018 CLINICAL DATA:  Trauma and thumb pain, initial encounter EXAM: LEFT HAND - 2 VIEW COMPARISON:  None. FINDINGS: Degenerative changes of the first [facility] joint are noted. No acute fracture or dislocation is seen. Mild soft tissue swelling about the wrist is seen. IMPRESSION: No acute fracture is noted. Degenerative changes of the first [facility] joint are seen. Electronically Signed   By: Alcide Clever M.D.   On: 07/09/2018 23:02    Scheduled Meds: . allopurinol  100 mg Oral Daily  . collagenase   Topical BID  . enoxaparin (LOVENOX) injection  40 mg Subcutaneous Q24H  . feeding supplement (ENSURE ENLIVE)  237 mL Oral BID BM  . feeding supplement (PRO-STAT SUGAR FREE 64)  30 mL Oral BID  . furosemide  20 mg Intravenous Q12H  . furosemide  40 mg Oral Daily  . levothyroxine  137 mcg Oral QAC breakfast  . liver oil-zinc oxide   Topical BID  . sertraline  50 mg Oral Daily      Time spent: 25 min  Meredeth Ide   [facility] Pager [phone]. If 7PM-7AM, please contact night-coverage at [email or web address], Office  [phone]  password [email or web address]  07/10/2018, 1:57 PM  LOS: 14 days

## 2018-07-10 NOTE — Care Management Important Message (Signed)
Important Message  Patient Details  Name: [name] MRN: [id number] Date of Birth: 01/02/1928   Medicare Important Message Given:  Yes    Darrik Richman P Manvir Thorson 07/10/2018, 2:23 PM

## 2018-07-10 NOTE — Progress Notes (Signed)
OT Cancellation Note  Patient Details Name: [name] MRN: [id number] DOB: 02/27/1928   Cancelled Treatment:    Reason Eval/Treat Not Completed: Patient at procedure or test/ unavailable. Getting Hydrotherpay with PT this a.m.  Margaretmary EddySpencer, Katira Dumais [facility] 07/10/2018, 1:48 PM

## 2018-07-10 NOTE — Progress Notes (Signed)
7 Days Post-Op   Subjective/Chief Complaint:  1 - Posterior Perineal Necrotizing Fascitis - s/p staged surgical debridement of posterior perineal fornier's by general surgery this admission. Also diverting colostomy 07/03/18.   2 - Bladder Spasms / Leakage Around SP Tube - SPT placed 07/03/18 by Dr. Alvester MorinBell to help divert urine away from perineal wound. 1.5-2L UOP per day via SPT since, some leakage noted around SPT. Bedside eval 8/13 confirms good placement / no obstruction. Oxybutynin started to help with spasms.  Today "Rebecca LopesGracie" is seen to evaluate leakage around SPT.    Objective: Vital signs in last 24 hours: Temp:  [97.9 F (36.6 C)-98.2 F (36.8 C)] 98.2 F (36.8 C) (08/13 1740) Pulse Rate:  [33-79] 78 (08/13 1740) Resp:  [12-27] 27 (08/13 1740) BP: (125-131)/(61-71) 131/71 (08/13 1740) SpO2:  [73 %-97 %] 97 % (08/13 1740) Last BM Date: 07/09/18  Intake/Output from previous day: 08/12 0701 - 08/13 0700 In: 480 [P.O.:480] Out: 1740 [Urine:1490; Stool:250] Intake/Output this shift: Total I/O In: 380 [P.O.:380] Out: 25 [Stool:25]  General appearance: alert, cooperative and many family members at bedside.  Eyes: negative Back: symmetric, no curvature. ROM normal. No CVA tenderness. Resp: non-labored on [place] O2 Cardio: Nl rate GI: soft, non-tender; bowel sounds normal; no masses,  no organomegaly and ostomy patent.  Extremities: extremities normal, atraumatic, no cyanosis or edema Neurologic: Grossly normal  SPT in place with clear yellow urine and dry dressing aroudn abd site. No erosion. Flushed with 120mL NS in 60 cc aliquots by hand quantitatvely w/o leak per urethra or around catheter.   Lab Results:  No results for input(s): WBC, HGB, HCT, PLT in the last 72 hours. BMET Recent Labs    07/08/18 0634 07/09/18 0249  NA 141 141  K 3.9 4.0  CL 112* 107  CO2 23 25  GLUCOSE 114* 124*  BUN 23 25*  CREATININE 1.05* 1.08*  CALCIUM 7.8* 8.1*   PT/INR No results for  input(s): LABPROT, INR in the last 72 hours. ABG No results for input(s): PHART, HCO3 in the last 72 hours.  Invalid input(s): PCO2, PO2  Studies/Results: Dg Hand 2 View Left  Result Date: 07/09/2018 CLINICAL DATA:  Trauma and thumb pain, initial encounter EXAM: LEFT HAND - 2 VIEW COMPARISON:  None. FINDINGS: Degenerative changes of the first [facility] joint are noted. No acute fracture or dislocation is seen. Mild soft tissue swelling about the wrist is seen. IMPRESSION: No acute fracture is noted. Degenerative changes of the first [facility] are seen. Electronically Signed   By: Alcide CleverMark  Lukens M.D.   On: 07/09/2018 23:02    Anti-infectives: Anti-infectives (From admission, onward)   Start     Dose/Rate Route Frequency Ordered Stop   07/04/18 1400  amoxicillin-clavulanate (AUGMENTIN) 875-125 MG per tablet 1 tablet     1 tablet Oral Every 12 hours 07/04/18 1332 07/09/18 2133   07/04/18 1300  cephALEXin (KEFLEX) capsule 500 mg  Status:  Discontinued     500 mg Oral Every 12 hours 07/04/18 1212 07/04/18 1332   06/30/18 1300  piperacillin-tazobactam (ZOSYN) IVPB 3.375 g  Status:  Discontinued     3.375 g 12.5 mL/hr over 240 Minutes Intravenous Every 8 hours 06/30/18 1242 07/04/18 1212   06/29/18 0900  vancomycin (VANCOCIN) 1,500 mg in sodium chloride 0.9 % 500 mL IVPB  Status:  Discontinued     1,500 mg 250 mL/hr over 120 Minutes Intravenous Every 48 hours 06/29/18 0805 07/02/18 1516   06/28/18 1055  vancomycin  variable dose per unstable renal function (pharmacist dosing)  Status:  Discontinued      Does not apply See admin instructions 06/28/18 1055 06/29/18 0805   06/27/18 2345  piperacillin-tazobactam (ZOSYN) IVPB 2.25 g  Status:  Discontinued     2.25 g 100 mL/hr over 30 Minutes Intravenous Every 8 hours 06/27/18 2336 06/30/18 1242   06/27/18 2315  vancomycin (VANCOCIN) IVPB 750 mg/150 ml premix  Status:  Discontinued    Note to Pharmacy:  Pharmacy to dose and monitor levels as appropriate for  necrotizing fascitis   750 mg 150 mL/hr over 60 Minutes Intravenous Every 12 hours 06/27/18 2303 06/27/18 2329   06/27/18 2315  clindamycin (CLEOCIN) IVPB 600 mg  Status:  Discontinued     600 mg 100 mL/hr over 30 Minutes Intravenous Every 8 hours 06/27/18 2309 07/02/18 1437   06/27/18 1215  vancomycin (VANCOCIN) 1,500 mg in sodium chloride 0.9 % 500 mL IVPB     1,500 mg 250 mL/hr over 120 Minutes Intravenous  Once 06/27/18 1142 06/27/18 1500   06/27/18 1100  metroNIDAZOLE (FLAGYL) IVPB 500 mg  Status:  Discontinued     500 mg 100 mL/hr over 60 Minutes Intravenous Every 8 hours 06/27/18 1052 06/27/18 2303   06/27/18 0200  cefTRIAXone (ROCEPHIN) 1 g in sodium chloride 0.9 % 100 mL IVPB  Status:  Discontinued     1 g 200 mL/hr over 30 Minutes Intravenous Every 24 hours 06/27/18 0138 06/27/18 2258      Assessment/Plan:  1 - Posterior Perineal Necrotizing Fascitis - now s/p fecal and urinary diversion away from wound, this is optimized as much as possible surgically for healing.   2 - Bladder Spasms / Leakage Around SP Tube - SPT in adequtae position, flushes quantitatvely. Majority of urine going through catheter. It is expected that 100% of urine will NOT go via SPT. Some will still traverse native urethra and some will leak around to some degree. Placed on oxybutynin to help minimize spasms which can exacerbate this. No indication for replacement at this time.  Please call with questions.   [facility] HospitalMANNY, [name] 07/10/2018

## 2018-07-10 NOTE — Progress Notes (Signed)
Physical Therapy Wound Treatment Patient Details  Name: MARIJO QUIZON MRN: [id number] Date of Birth: 1928-07-20  Today's Date: 07/10/2018 Time: 1000-1106 Time Calculation (min): 66 min  Subjective  Subjective: Pt expressing pain in L wrist - family had questions regarding x-ray that was done last night and results.  Patient and Family Stated Goals: "Get her functioning before she is discharged home" Per daughter Date of Onset: (unsure) Prior Treatments: Surgical debridement 06/27/18 and 06/29/18. On 8/6 pt underwent diverting colostomy and SP catheter placement.  Pain Score:  Premedicated. Pain in wound as well as L hand/wrist during session.   Wound Assessment  Pressure Injury 06/27/18 Unstageable - Full thickness tissue loss in which the base of the ulcer is covered by slough (yellow, tan, gray, green or brown) and/or eschar (tan, brown or black) in the wound bed. Three areas of black eschar surrounded by blan (Active)  Wound Image    07/09/2018 12:30 PM  Dressing Type ABD;Barrier Film (skin prep);Gauze (Comment);Moist to dry;Paper tape 07/10/2018 12:30 PM  Dressing Changed;Clean;Dry;Intact 07/10/2018 12:30 PM  Dressing Change Frequency Twice a day 07/10/2018 12:30 PM  State of Healing Early/partial granulation 07/10/2018 12:30 PM  Site / Wound Assessment Pink;Red;Yellow 07/10/2018 12:30 PM  % Wound base Red or Granulating 80% 07/10/2018 12:30 PM  % Wound base Yellow/Fibrinous Exudate 20% 07/10/2018 12:30 PM  % Wound base Black/Eschar 0% 07/10/2018 12:30 PM  % Wound base Other/Granulation Tissue (Comment) 0% 07/10/2018 12:30 PM  Peri-wound Assessment Intact;Erythema (blanchable) 07/10/2018 12:30 PM  Wound Length (cm) 17.2 cm 07/09/2018 10:23 AM  Wound Width (cm) 15.9 cm 07/09/2018 10:23 AM  Wound Depth (cm) 3 cm 07/09/2018 10:23 AM  Wound Surface Area (cm^2) 273.48 cm^2 07/09/2018 10:23 AM  Wound Volume (cm^3) 820.44 cm^3 07/09/2018 10:23 AM  Tunneling (cm) 7.4 cm tunnel in center of wound 07/09/2018  12:30 PM  Undermining (cm) 5.2 cm 9-11:00; 1.6 cm 6-8:00; 1.5 cm 11-12:00 07/09/2018 12:30 PM  Margins Unattached edges (unapproximated) 07/10/2018 12:30 PM  Drainage Amount Scant 07/10/2018 12:30 PM  Drainage Description Serosanguineous 07/10/2018 12:30 PM  Treatment Debridement (Selective);Hydrotherapy (Pulse lavage);Packing (Saline gauze) 07/10/2018 12:30 PM   Santyl applied to wound bed prior to applying dressing.    Hydrotherapy Pulsed lavage therapy - wound location: Buttock Pulsed Lavage with Suction (psi): 12 psi Pulsed Lavage with Suction - Normal Saline Used: 1000 mL Pulsed Lavage Tip: Tip with splash shield Selective Debridement Selective Debridement - Location: Buttock Selective Debridement - Tools Used: Forceps;Scissors Selective Debridement - Tissue Removed: Yellow slough   Wound Assessment and Plan  Wound Therapy - Assess/Plan/Recommendations Wound Therapy - Clinical Statement: Wound bed continues to appear improved. Fluid-filled blisters appear larger at base of spine. Unit nursing director present to witness - plan was made to use skin prep and paper tape for all dressing changes in the future.  Pt will benefit from continued hydrotherapy for selective removal of unviable tissue, to decrease bioburden and promote wound bed healing. Wound Therapy - Functional Problem List: Decreased tolerance for OOB mobility/position changes Factors Delaying/Impairing Wound Healing: Immobility;Multiple medical problems;Altered sensation Hydrotherapy Plan: Debridement;Dressing change;Patient/family education;Pulsatile lavage with suction Wound Therapy - Frequency: 6X / week Wound Therapy - Follow Up Recommendations: Other (comment)(CIR) Wound Plan: See above  Wound Therapy Goals- Improve the function of patient's integumentary system by progressing the wound(s) through the phases of wound healing (inflammation - proliferation - remodeling) by: Decrease Necrotic Tissue to: 0% Decrease  Necrotic Tissue - Progress: Progressing toward goal Increase Granulation Tissue to:  100% Increase Granulation Tissue - Progress: Progressing toward goal Goals/treatment plan/discharge plan were made with and agreed upon by patient/family: Yes Time For Goal Achievement: 7 days Wound Therapy - Potential for Goals: Good  Goals will be updated until maximal potential achieved or discharge criteria met.  Discharge criteria: when goals achieved, discharge from hospital, MD decision/surgical intervention, no progress towards goals, refusal/missing three consecutive treatments without notification or medical reason.  GP     Thelma Comp 07/10/2018, 12:41 PM   Rolinda Roan, PT, DPT Acute Rehabilitation Services Pager: [phone]

## 2018-07-10 NOTE — Progress Notes (Signed)
Pt has home machine at bedside- family assisting with use. Pt has been refusing use while in hospital

## 2018-07-11 ENCOUNTER — Encounter (HOSPITAL_COMMUNITY): Payer: Self-pay | Admitting: *Deleted

## 2018-07-11 ENCOUNTER — Encounter (HOSPITAL_COMMUNITY): Payer: Medicare Other

## 2018-07-11 ENCOUNTER — Inpatient Hospital Stay (HOSPITAL_COMMUNITY)
Admission: RE | Admit: 2018-07-11 | Discharge: 2018-07-19 | DRG: 948 | Disposition: A | Discharge status: Home Health | Payer: Medicare Other | Source: Intra-hospital | Attending: Physical Medicine & Rehabilitation | Admitting: Physical Medicine & Rehabilitation

## 2018-07-11 DIAGNOSIS — Z888 Allergy status to other drugs, medicaments and biological substances status: Secondary | ICD-10-CM

## 2018-07-11 DIAGNOSIS — Z6841 Body Mass Index (BMI) 40.0 and over, adult: Secondary | ICD-10-CM

## 2018-07-11 DIAGNOSIS — R5381 Other malaise: Secondary | ICD-10-CM | POA: Diagnosis present

## 2018-07-11 DIAGNOSIS — Z841 Family history of disorders of kidney and ureter: Secondary | ICD-10-CM

## 2018-07-11 DIAGNOSIS — Z809 Family history of malignant neoplasm, unspecified: Secondary | ICD-10-CM

## 2018-07-11 DIAGNOSIS — Z823 Family history of stroke: Secondary | ICD-10-CM | POA: Diagnosis not present

## 2018-07-11 DIAGNOSIS — E039 Hypothyroidism, unspecified: Secondary | ICD-10-CM | POA: Diagnosis present

## 2018-07-11 DIAGNOSIS — Z87891 Personal history of nicotine dependence: Secondary | ICD-10-CM | POA: Diagnosis not present

## 2018-07-11 DIAGNOSIS — L98429 Non-pressure chronic ulcer of back with unspecified severity: Secondary | ICD-10-CM | POA: Diagnosis not present

## 2018-07-11 DIAGNOSIS — Z833 Family history of diabetes mellitus: Secondary | ICD-10-CM

## 2018-07-11 DIAGNOSIS — Z882 Allergy status to sulfonamides status: Secondary | ICD-10-CM

## 2018-07-11 DIAGNOSIS — A419 Sepsis, unspecified organism: Secondary | ICD-10-CM | POA: Diagnosis present

## 2018-07-11 DIAGNOSIS — K219 Gastro-esophageal reflux disease without esophagitis: Secondary | ICD-10-CM | POA: Diagnosis present

## 2018-07-11 DIAGNOSIS — I5033 Acute on chronic diastolic (congestive) heart failure: Secondary | ICD-10-CM

## 2018-07-11 DIAGNOSIS — I10 Essential (primary) hypertension: Secondary | ICD-10-CM | POA: Diagnosis present

## 2018-07-11 DIAGNOSIS — Z9841 Cataract extraction status, right eye: Secondary | ICD-10-CM | POA: Diagnosis not present

## 2018-07-11 DIAGNOSIS — Z885 Allergy status to narcotic agent status: Secondary | ICD-10-CM | POA: Diagnosis not present

## 2018-07-11 DIAGNOSIS — Z96653 Presence of artificial knee joint, bilateral: Secondary | ICD-10-CM | POA: Diagnosis present

## 2018-07-11 DIAGNOSIS — K5730 Diverticulosis of large intestine without perforation or abscess without bleeding: Secondary | ICD-10-CM | POA: Diagnosis present

## 2018-07-11 DIAGNOSIS — Z96642 Presence of left artificial hip joint: Secondary | ICD-10-CM | POA: Diagnosis present

## 2018-07-11 DIAGNOSIS — Z9842 Cataract extraction status, left eye: Secondary | ICD-10-CM | POA: Diagnosis not present

## 2018-07-11 DIAGNOSIS — M1611 Unilateral primary osteoarthritis, right hip: Secondary | ICD-10-CM | POA: Diagnosis present

## 2018-07-11 DIAGNOSIS — M7989 Other specified soft tissue disorders: Secondary | ICD-10-CM | POA: Diagnosis not present

## 2018-07-11 DIAGNOSIS — N179 Acute kidney failure, unspecified: Secondary | ICD-10-CM | POA: Diagnosis present

## 2018-07-11 DIAGNOSIS — M109 Gout, unspecified: Secondary | ICD-10-CM | POA: Diagnosis present

## 2018-07-11 DIAGNOSIS — I5032 Chronic diastolic (congestive) heart failure: Secondary | ICD-10-CM

## 2018-07-11 DIAGNOSIS — M10042 Idiopathic gout, left hand: Secondary | ICD-10-CM | POA: Diagnosis not present

## 2018-07-11 DIAGNOSIS — Z793 Long term (current) use of hormonal contraceptives: Secondary | ICD-10-CM

## 2018-07-11 DIAGNOSIS — F329 Major depressive disorder, single episode, unspecified: Secondary | ICD-10-CM | POA: Diagnosis present

## 2018-07-11 DIAGNOSIS — G4733 Obstructive sleep apnea (adult) (pediatric): Secondary | ICD-10-CM | POA: Diagnosis present

## 2018-07-11 DIAGNOSIS — D62 Acute posthemorrhagic anemia: Secondary | ICD-10-CM | POA: Diagnosis present

## 2018-07-11 DIAGNOSIS — M1812 Unilateral primary osteoarthritis of first carpometacarpal joint, left hand: Secondary | ICD-10-CM | POA: Diagnosis not present

## 2018-07-11 DIAGNOSIS — A408 Other streptococcal sepsis: Secondary | ICD-10-CM

## 2018-07-11 DIAGNOSIS — Z933 Colostomy status: Secondary | ICD-10-CM | POA: Diagnosis not present

## 2018-07-11 LAB — BASIC METABOLIC PANEL
Anion gap: 8 (ref 5–15)
BUN: 24 mg/dL — AB (ref 8–23)
CHLORIDE: 101 mmol/L (ref 98–111)
CO2: 28 mmol/L (ref 22–32)
CREATININE: 1.09 mg/dL — AB (ref 0.44–1.00)
Calcium: 7.9 mg/dL — ABNORMAL LOW (ref 8.9–10.3)
GFR calc Af Amer: 51 mL/min — ABNORMAL LOW (ref >60)
GFR calc non Af Amer: 44 mL/min — ABNORMAL LOW (ref >60)
Glucose, Bld: 122 mg/dL — ABNORMAL HIGH (ref 70–99)
POTASSIUM: 3.8 mmol/L (ref 3.5–5.1)
Sodium: 137 mmol/L (ref 135–145)

## 2018-07-11 LAB — CBC
HEMATOCRIT: 26.6 % — AB (ref 36.0–46.0)
HEMOGLOBIN: 8.3 g/dL — AB (ref 12.0–15.0)
MCH: 29.4 pg (ref 26.0–34.0)
MCHC: 31.2 g/dL (ref 30.0–36.0)
MCV: 94.3 fL (ref 78.0–100.0)
Platelets: 164 10*3/uL (ref 150–400)
RBC: 2.82 MIL/uL — ABNORMAL LOW (ref 3.87–5.11)
RDW: 16.2 % — ABNORMAL HIGH (ref 11.5–15.5)
WBC: 10.2 10*3/uL (ref 4.0–10.5)

## 2018-07-11 MED ORDER — OXYBUTYNIN CHLORIDE 5 MG PO TABS
5.0000 mg | ORAL_TABLET | Freq: Two times a day (BID) | ORAL | Status: DC
Start: 2018-07-11 — End: 2018-07-19

## 2018-07-11 MED ORDER — COLLAGENASE 250 UNIT/GM EX OINT
TOPICAL_OINTMENT | Freq: Two times a day (BID) | CUTANEOUS | Status: DC
  Administered 2018-07-11 – 2018-07-19 (×16): via TOPICAL
  Filled 2018-07-11 (×5): qty 30

## 2018-07-11 MED ORDER — PRO-STAT SUGAR FREE PO LIQD: 30.0000 mL | Freq: Two times a day (BID) | ORAL | 0 refills | Status: DC

## 2018-07-11 MED ORDER — ONDANSETRON HCL 4 MG/2ML IJ SOLN: 4.0000 mg | Freq: Four times a day (QID) | INTRAMUSCULAR | 0 refills | Status: DC | PRN

## 2018-07-11 MED ORDER — ENSURE ENLIVE PO LIQD
237.0000 mL | Freq: Two times a day (BID) | ORAL | Status: DC
Start: 2018-07-12 — End: 2018-07-19
  Administered 2018-07-12 – 2018-07-19 (×15): 237 mL via ORAL

## 2018-07-11 MED ORDER — ENSURE ENLIVE PO LIQD: 237.0000 mL | Freq: Two times a day (BID) | ORAL | 12 refills | Status: DC

## 2018-07-11 MED ORDER — TRAMADOL HCL 50 MG PO TABS: 50.0000 mg | ORAL_TABLET | Freq: Four times a day (QID) | ORAL | Status: DC | PRN

## 2018-07-11 MED ORDER — ENOXAPARIN SODIUM 40 MG/0.4ML ~~LOC~~ SOLN: 40.0000 mg | SUBCUTANEOUS | Status: DC

## 2018-07-11 MED ORDER — TRAMADOL HCL 50 MG PO TABS
50.0000 mg | ORAL_TABLET | Freq: Four times a day (QID) | ORAL | Status: DC | PRN
  Administered 2018-07-11 – 2018-07-18 (×16): 50 mg via ORAL
  Filled 2018-07-11 (×16): qty 1

## 2018-07-11 MED ORDER — ZINC OXIDE 40 % EX OINT: TOPICAL_OINTMENT | Freq: Two times a day (BID) | CUTANEOUS | 0 refills | Status: DC

## 2018-07-11 MED ORDER — ENOXAPARIN SODIUM 40 MG/0.4ML ~~LOC~~ SOLN
40.0000 mg | SUBCUTANEOUS | Status: DC
  Administered 2018-07-12 – 2018-07-19 (×8): 40 mg via SUBCUTANEOUS
  Filled 2018-07-11 (×8): qty 0.4

## 2018-07-11 MED ORDER — PRO-STAT SUGAR FREE PO LIQD
30.0000 mL | Freq: Two times a day (BID) | ORAL | Status: DC
  Administered 2018-07-11 – 2018-07-19 (×16): 30 mL via ORAL
  Filled 2018-07-11 (×16): qty 30

## 2018-07-11 MED ORDER — FENTANYL CITRATE (PF) 100 MCG/2ML IJ SOLN: 25.0000 ug | INTRAMUSCULAR | 0 refills | Status: DC | PRN

## 2018-07-11 MED ORDER — ACETAMINOPHEN 650 MG RE SUPP: 650.0000 mg | Freq: Four times a day (QID) | RECTAL | Status: DC | PRN

## 2018-07-11 MED ORDER — ACETAMINOPHEN 325 MG PO TABS
650.0000 mg | ORAL_TABLET | Freq: Four times a day (QID) | ORAL | Status: DC | PRN
  Administered 2018-07-14 – 2018-07-18 (×8): 650 mg via ORAL
  Filled 2018-07-11 (×8): qty 2

## 2018-07-11 MED ORDER — ZINC OXIDE 40 % EX OINT
TOPICAL_OINTMENT | Freq: Two times a day (BID) | CUTANEOUS | Status: DC
  Administered 2018-07-11 – 2018-07-19 (×16): via TOPICAL
  Filled 2018-07-11: qty 57

## 2018-07-11 MED ORDER — FUROSEMIDE 40 MG PO TABS
40.0000 mg | ORAL_TABLET | Freq: Every day | ORAL | Status: DC
  Administered 2018-07-12 – 2018-07-19 (×8): 40 mg via ORAL
  Filled 2018-07-11 (×10): qty 1

## 2018-07-11 MED ORDER — ALLOPURINOL 100 MG PO TABS: 100.0000 mg | ORAL_TABLET | Freq: Every day | ORAL | Status: DC

## 2018-07-11 MED ORDER — FUROSEMIDE 40 MG PO TABS: 40.0000 mg | ORAL_TABLET | Freq: Every day | ORAL | Status: DC

## 2018-07-11 MED ORDER — SERTRALINE HCL 50 MG PO TABS
50.0000 mg | ORAL_TABLET | Freq: Every day | ORAL | Status: DC
  Administered 2018-07-12 – 2018-07-19 (×8): 50 mg via ORAL
  Filled 2018-07-11 (×9): qty 1

## 2018-07-11 MED ORDER — LEVOTHYROXINE SODIUM 25 MCG PO TABS
137.0000 ug | ORAL_TABLET | Freq: Every day | ORAL | Status: DC
  Administered 2018-07-12 – 2018-07-19 (×8): 137 ug via ORAL
  Filled 2018-07-11 (×8): qty 1

## 2018-07-11 MED ORDER — COLLAGENASE 250 UNIT/GM EX OINT: TOPICAL_OINTMENT | Freq: Two times a day (BID) | CUTANEOUS | 0 refills | Status: DC

## 2018-07-11 MED ORDER — IPRATROPIUM-ALBUTEROL 0.5-2.5 (3) MG/3ML IN SOLN
3.0000 mL | Freq: Four times a day (QID) | RESPIRATORY_TRACT | Status: DC | PRN
  Administered 2018-07-14: 3 mL via RESPIRATORY_TRACT
  Filled 2018-07-11: qty 3

## 2018-07-11 NOTE — Consult Note (Addendum)
WOC Nurse ostomy follow up Stoma type/location: Left colostomy Stomal assessment/size: 1" top to bottom, 1.5" side to side. Pale pink, moist, below skin level.  Peristomal assessment: intact Treatment options for stomal/peristomal skin: skin barrier and barrier ring Output brown effluent Ostomy pouching: 1pc. convex  Education provided:  Arrived to room with patient being cleaned and pouch had already been removed. Pt has a raised indurated area, non compressable. It is 4cm x 8cm area at proximal end of ostomy.Does not appear to cause pain.  Removed the red rubber anchor as asked by CCS PA. Placed a 1 piece convex. Explained to pt and family that the purpose of the convex pouch is to push down around stoma slightly to help stoma protrude and stool to stay in pouch and not leak under pouch. Family able to verbalize purpose of barrier ring and how to attach. They prefer to attach to pouch. Instructed on how to also apply to skin, either way acceptable. Belt ordered and purpose explained. Patient will be transferring to Rehab this afternoon. Supplies being obtained from bedside RN to send with patient.  [place] [facility] [place]: Yes, previously WOC team will follow on Rehab. Barnett HatterMelinda Maurion Walkowiak, RN-C, WTA-C, OCA Wound Treatment Associate Ostomy Care Associate

## 2018-07-11 NOTE — Consult Note (Signed)
WOC Nurse ostomy follow up Consult received for patient now that she is in IP Rehab. Patient known to our department and we will continue to follow.  WOC nursing team will follow, and will remain available to this patient, the nursing and medical teams.    Thanks, Ladona MowLaurie Analayah Brooke, MSN, RN, GNP, Hans EdenCWOCN, CWON-AP, FAAN  Pager# [phone]

## 2018-07-11 NOTE — Care Management Note (Signed)
Case Management Note Previous CM note completed by Letha Capeeborah Taylor RN, CM  Patient Details  Name: [name] MRN: [id number] Date of Birth: 10/25/1928  Subjective/Objective:     From home, presents with sepsis secondary to l buttock fourniers gangrene, aki, ckd stage 3, acute/chronic anemia, enteritis, obesity, osa, for I and D today,  Will need colostomy when more stable, and hydrotherapy.               Action/Plan: NCM will follow for transition of care needs.   Expected Discharge Date:  07/11/18               Expected Discharge Plan:  IP Rehab Facility  In-House Referral:  Clinical Social Work  Discharge planning Services  CM Consult  Post Acute Care Choice:  NA Choice offered to:  NA  DME Arranged:    DME Agency:     HH Arranged:    HH Agency:     Status of Service:  Completed, signed off  If discussed at [employer] Meetings, dates discussed:    Discharge Disposition: IP rehab   Additional Comments:  07/11/18- 1140- Donn PieriniKristi Nyssa Sayegh RN, CM- notified by Tresa EndoKelly with CIR that pt has bed and insurance auth for transition to IP rehab today. Pt is medically stable for transition to rehab per MD and [facility] for continued treatment and rehab.   Donn PieriniWebster, Yitta Gongaware ClimaxHall, RN 07/11/2018, 11:43 AM [phone] 4E Transition Care Coordinator

## 2018-07-11 NOTE — Progress Notes (Signed)
Pt admitted to unit with family at bedside. RN educated pt and family on rehab process and safety plan with verbal understanding. Family at bedside, Call bell within reach.

## 2018-07-11 NOTE — Progress Notes (Signed)
[facility] Progress Note  8 Days Post-Op  Subjective: CC:  No new complaints. Small amt leakage around SP cath. Having colostomy output.   Objective: Vital signs in last 24 hours: Temp:  [98 F (36.7 C)-98.9 F (37.2 C)] 98 F (36.7 C) (08/14 0700) Pulse Rate:  [33-78] 67 (08/14 0700) Resp:  [12-27] 21 (08/13 1949) BP: (94-131)/(38-71) 107/38 (08/14 0700) SpO2:  [73 %-97 %] 95 % (08/14 0700) Weight:  [104.6 kg] 104.6 kg (08/14 0500) Last BM Date: 07/09/18  Intake/Output from previous day: 08/13 0701 - 08/14 0700 In: 520 [P.O.:520] Out: 2325 [Urine:2300; Stool:25] Intake/Output this shift: No intake/output data recorded.  PE: Gen:  Alert, NAD, pleasant Abd: Soft, obese, non-tender, colostomy with stool in pouch, red rubber in place, stoma viable  Skin: warm and dry, no rashes  Psych: A&Ox3   Lab Results:  Recent Labs    07/11/18 0323  WBC 10.2  HGB 8.3*  HCT 26.6*  PLT 164   BMET Recent Labs    07/09/18 0249 07/11/18 0323  NA 141 137  K 4.0 3.8  CL 107 101  CO2 25 28  GLUCOSE 124* 122*  BUN 25* 24*  CREATININE 1.08* 1.09*  CALCIUM 8.1* 7.9*   PT/INR No results for input(s): LABPROT, INR in the last 72 hours. CMP     Component Value Date/Time   NA 137 07/11/2018 0323   K 3.8 07/11/2018 0323   CL 101 07/11/2018 0323   CO2 28 07/11/2018 0323   GLUCOSE 122 (H) 07/11/2018 0323   BUN 24 (H) 07/11/2018 0323   CREATININE 1.09 (H) 07/11/2018 0323   CALCIUM 7.9 (L) 07/11/2018 0323   PROT 5.3 (L) 06/27/2018 0416   ALBUMIN 2.2 (L) 06/27/2018 0416   AST 19 06/27/2018 0416   ALT 12 06/27/2018 0416   ALKPHOS 101 06/27/2018 0416   BILITOT 1.3 (H) 06/27/2018 0416   GFRNONAA 44 (L) 07/11/2018 0323   GFRAA 51 (L) 07/11/2018 0323   Lipase  No results found for: LIPASE     Studies/Results: Dg Hand 2 View Left  Result Date: 07/09/2018 CLINICAL DATA:  Trauma and thumb pain, initial encounter EXAM: LEFT HAND - 2 VIEW COMPARISON:  None.  FINDINGS: Degenerative changes of the first [facility] joint are noted. No acute fracture or dislocation is seen. Mild soft tissue swelling about the wrist is seen. IMPRESSION: No acute fracture is noted. Degenerative changes of the [facility] are seen. Electronically Signed   By: Alcide CleverMark  Lukens M.D.   On: 07/09/2018 23:02    Anti-infectives: Anti-infectives (From admission, onward)   Start     Dose/Rate Route Frequency Ordered Stop   07/04/18 1400  amoxicillin-clavulanate (AUGMENTIN) 875-125 MG per tablet 1 tablet     1 tablet Oral Every 12 hours 07/04/18 1332 07/09/18 2133   07/04/18 1300  cephALEXin (KEFLEX) capsule 500 mg  Status:  Discontinued     500 mg Oral Every 12 hours 07/04/18 1212 07/04/18 1332   06/30/18 1300  piperacillin-tazobactam (ZOSYN) IVPB 3.375 g  Status:  Discontinued     3.375 g 12.5 mL/hr over 240 Minutes Intravenous Every 8 hours 06/30/18 1242 07/04/18 1212   06/29/18 0900  vancomycin (VANCOCIN) 1,500 mg in sodium chloride 0.9 % 500 mL IVPB  Status:  Discontinued     1,500 mg 250 mL/hr over 120 Minutes Intravenous Every 48 hours 06/29/18 0805 07/02/18 1516   06/28/18 1055  vancomycin variable dose per unstable renal function (pharmacist dosing)  Status:  Discontinued      Does not apply See admin instructions 06/28/18 1055 06/29/18 0805   06/27/18 2345  piperacillin-tazobactam (ZOSYN) IVPB 2.25 g  Status:  Discontinued     2.25 g 100 mL/hr over 30 Minutes Intravenous Every 8 hours 06/27/18 2336 06/30/18 1242   06/27/18 2315  vancomycin (VANCOCIN) IVPB 750 mg/150 ml premix  Status:  Discontinued    Note to Pharmacy:  Pharmacy to dose and monitor levels as appropriate for necrotizing fascitis   750 mg 150 mL/hr over 60 Minutes Intravenous Every 12 hours 06/27/18 2303 06/27/18 2329   06/27/18 2315  clindamycin (CLEOCIN) IVPB 600 mg  Status:  Discontinued     600 mg 100 mL/hr over 30 Minutes Intravenous Every 8 hours 06/27/18 2309 07/02/18 1437   06/27/18 1215  vancomycin  (VANCOCIN) 1,500 mg in sodium chloride 0.9 % 500 mL IVPB     1,500 mg 250 mL/hr over 120 Minutes Intravenous  Once 06/27/18 1142 06/27/18 1500   06/27/18 1100  metroNIDAZOLE (FLAGYL) IVPB 500 mg  Status:  Discontinued     500 mg 100 mL/hr over 60 Minutes Intravenous Every 8 hours 06/27/18 1052 06/27/18 2303   06/27/18 0200  cefTRIAXone (ROCEPHIN) 1 g in sodium chloride 0.9 % 100 mL IVPB  Status:  Discontinued     1 g 200 mL/hr over 30 Minutes Intravenous Every 24 hours 06/27/18 0138 06/27/18 2258       Assessment/Plan Diastolic dysfunction UTI - comepleted abx treatment AKI Chronic normocytic anemia  Hypothyroidism  Sepsis - 2/2 below, resolved. Fournier's gangrene 1. S/P excisional debridement of L gluteal necrotizing soft tissue infection, Dr. Cliffton AstersWhite, 07/31 2. S/PDebridement of large sacral perineal and sacral wound, Dr. Luisa Hartornett, 08/02 3. S/P diverting loop colostomy and SP tube placement 07/03/18 Dr. Derrell Lollingamirez and Dr. Alvester MorinBell - cxsw/klebsiella pneumoniae, completed abx tratment  -continuehydrotherapy  - BID dressing changes with prn changes when soiled - will perform wound check during hydrotherapy today  - will ask WOC RN to remove red rubber at next ostomy change.   FEN: HH ID: vanc7/31>8/5,cleocin7/31>8/5; zosyn7/31- 8/12, Augmentin VTE: SCD's, Lovenox Foley: suprapubic cath   LOS: 15 days     Adam PhenixElizabeth S Sai Zinn , [facility] 07/11/2018, 8:34 AM Pager: [phone] Consults: [phone] Mon-Fri 7:00 am-4:30 pm Sat-Sun 7:00 am-11:30 am

## 2018-07-11 NOTE — Discharge Summary (Addendum)
Physician Discharge Summary  Mickie KayGracie B Castaneda ZOX:[id number] DOB: 09/28/1928 DOA: 06/26/2018  PCP: Rebecca FunkGriffin, John, MD  Admit date: 06/26/2018 Discharge date: 07/11/2018  Admitted From: Home Disposition:  CIR  Discharge Condition:Stable CODE STATUS:DNR Diet recommendation: Heart Healthy  Brief/Interim Summary: Patient is a 84 year old female with a history of hypothyroidism who presented with complains of  buttock pain, malaise, diarrhea and weakness.  She went to urgent care and she was told that she was dehydrated.  She came to the ER was found to have BP 80/40, leukocytosis, creatinine 5.5, lactic acidosis.  Patient improved after she was given fluid boluses.  Started on ceftriaxone for presumed UTI.  CT abdomen showed enteritis, buttock cellulitis.  Examination of buttock showed cellulitis with necrosis.  Patient admitted for sepsis and perineal necrotizing fasciitis.  Started on empiric antibiotics.  Surgery was consulted for debridement.  Urology also following and she is status post suprapubic tube placement.  Patient was evaluated by physical therapy and recommended CIR on discharge. Currently she is hemodynamically stable.  She is stable for discharge to CIR today.  She needs to follow-up with general surgery and urology as an outpatient.   Following problems were addressed during her hospitalization:   1. Sepsis from perineal necrotizing fasciitis-resolved, status post debridement of left gluteal necrotizing soft tissue infection by Dr. Cliffton AstersWhite on 06/27/2018.  Status post debridement of large sacral, perineal wound by Dr. Luisa Hartornett on 06/29/2018, status post diverting colostomy placement and status post catheter placement 07/03/2018.  Wound culture grew Klebsiella pneumoniae sensitive to cefazolin.  Patient was started on Augmentin yesterday.  WBC is down to 15,000.  Follow CBC in a.m.  IV Zosyn was discontinued.    Blood cultures x2 remain negative to date.  Fungus culture is still pending.   Continue hydrotherapy, dressing changes as per surgery recommendations.  Urology was consulted for leaking around the suprapubic catheter.  Discussed with nursing staff, no more leaking around suprapubic catheter site. 2. Acute on chronic grade 2 diastolic dysfunction- improved after starting diuretics,continue Lasix daily .  3. UTI-urine culture from 06/27/2018 showed Enterobacter, sensitive to Zosyn.  Patients antibiotic was changed to Augmentin for total 10 days of therapy, stopped on 07/09/2018.  4. Acute kidney injury-patient's baseline creatinine is 1.4-1.5, she came in with creatinine of 5.5.  Creatinine is slowly improving.   Lasix has been restarted.  5. Chronic normocytic anemia-patient's baseline hemoglobin is around 10-11.  FOBT negative.  There was low suspicion for GI bleed, gastroenterology was consulted for enteritis but at that time patient was found to have buttock cellulitis and abscess so GI signed off.  Hemoglobin currently stable .  6. Hypothyroidism-continue Synthroid 7. History of hypertension-patient was hypotensive at the time of admission.  Currently blood pressure remains stable. Losartan on hold.  8. Enteritis-nonspecific finding seen on CT scan. No diarrhea,  monitor stools. 9. Bladder spasms/status post SP tube-urology was following.  Currently SPT functioning well.  Continue oxybutynin for bladder spasms.    Discharge Diagnoses:  Principal Problem:   Hypotension Active Problems:   Hypothyroidism   ARF (acute renal failure) (HCC)   Black stool   Anemia   Pressure injury of skin   Debility   OSA (obstructive sleep apnea)   Diastolic dysfunction   AKI (acute kidney injury) (HCC)   Post-operative pain   Tachypnea   Steroid-induced hyperglycemia   Leukocytosis    Discharge Instructions  Discharge Instructions    Diet - low sodium heart healthy   Complete by:  As directed    Discharge instructions   Complete by:  As directed    1)Follow up with general  surgery and urology as an outpatient after discharge from CIR. 2) Do a CBC and BMP test in 3 days.   Increase activity slowly   Complete by:  As directed      Allergies as of 07/11/2018      Reactions   Morphine And Related Hives, Itching   Gabapentin Other (See Comments)   Alters mood   Lipitor [atorvastatin] Itching, Rash   Lyrica [pregabalin] Other (See Comments)   Lethargic, mood changes   Sulfa Antibiotics Rash      Medication List    STOP taking these medications   losartan 100 MG tablet Commonly known as:  COZAAR   nitroGLYCERIN 0.4 MG SL tablet Commonly known as:  NITROSTAT     TAKE these medications   albuterol 108 (90 Base) MCG/ACT inhaler Commonly known as:  PROVENTIL HFA;VENTOLIN HFA Inhale 2 puffs into the lungs every 6 (six) hours as needed for wheezing or shortness of breath.   allopurinol 100 MG tablet Commonly known as:  ZYLOPRIM Take 1 tablet (100 mg total) by mouth daily. What changed:  how much to take   ALPRAZolam 0.5 MG tablet Commonly known as:  XANAX Take 0.5 mg by mouth at bedtime.   calcium citrate-vitamin D 315-200 MG-UNIT tablet Commonly known as:  CITRACAL+D Take 1 tablet by mouth daily.   collagenase ointment Commonly known as:  SANTYL Apply topically 2 (two) times daily.   enoxaparin 40 MG/0.4ML injection Commonly known as:  LOVENOX Inject 0.4 mLs (40 mg total) into the skin daily.   feeding supplement (ENSURE ENLIVE) Liqd Take 237 mLs by mouth 2 (two) times daily between meals.   feeding supplement (PRO-STAT SUGAR FREE 64) Liqd Take 30 mLs by mouth 2 (two) times daily.   fentaNYL 100 MCG/2ML injection Commonly known as:  SUBLIMAZE Inject 0.5 mLs (25 mcg total) into the vein every 2 (two) hours as needed for severe pain.   furosemide 40 MG tablet Commonly known as:  LASIX Take 1 tablet (40 mg total) by mouth daily. What changed:    medication strength  how much to take  when to take this  additional instructions    GLUCOSAMINE CHONDROITIN VIT D3 Caps Take 1 capsule by mouth daily.   guaiFENesin 600 MG 12 hr tablet Commonly known as:  MUCINEX Take 1 tablet (600 mg total) by mouth 2 (two) times daily as needed for cough or to loosen phlegm.   levothyroxine 137 MCG tablet Commonly known as:  SYNTHROID, LEVOTHROID Take 137 mcg by mouth daily before breakfast.   liver oil-zinc oxide 40 % ointment Commonly known as:  DESITIN Apply topically 2 (two) times daily.   multivitamin with minerals Tabs tablet Take 1 tablet by mouth daily.   omeprazole 40 MG capsule Commonly known as:  PRILOSEC Take 40 mg by mouth daily.   ondansetron 4 MG/2ML Soln injection Commonly known as:  ZOFRAN Inject 2 mLs (4 mg total) into the vein every 6 (six) hours as needed for nausea or vomiting.   oxybutynin 5 MG tablet Commonly known as:  DITROPAN Take 1 tablet (5 mg total) by mouth 2 (two) times daily.   sertraline 50 MG tablet Commonly known as:  ZOLOFT Take 50 mg by mouth daily.   traMADol 50 MG tablet Commonly known as:  ULTRAM Take 1 tablet (50 mg total) by mouth every 6 (six)  hours as needed for moderate pain (or Headache unrelieved by tylenol).      Follow-up Information    [facility], [facility]. Schedule an appointment as soon as possible for a visit in 1 week(s).   Specialty:  General Surgery Why:  Call for appoitnment in a week after discharge from rehab Contact information: [address] [place] [address] [phone]        Sebastian Ache, MD. Schedule an appointment as soon as possible for a visit in 1 week(s).   Specialty:  Urology Why:  Call for appoitnment in a week after discharge from rehab Contact information: [address] [place] [address] [phone]          Allergies  Allergen Reactions  . Morphine And Related Hives and Itching  . Gabapentin Other (See Comments)    Alters mood  . Lipitor [Atorvastatin] Itching and Rash  . Lyrica [Pregabalin]  Other (See Comments)    Lethargic, mood changes  . Sulfa Antibiotics Rash    Consultations: Urology, general surgery  Procedures/Studies: Ct Abdomen Pelvis Wo Contrast  Result Date: 06/27/2018 CLINICAL DATA:  Initial evaluation for acute abdominal pain. EXAM: CT ABDOMEN AND PELVIS WITHOUT CONTRAST TECHNIQUE: Multidetector CT imaging of the abdomen and pelvis was performed following the standard protocol without IV contrast. COMPARISON:  Prior CT from 03/16/2011. FINDINGS: Lower chest: Scattered subsegmental atelectatic changes present within the visualized lung bases. 7 mm nodular density at the right lung base (series 4, image 2), slightly more prominent as compared to previous exam. Cardiomegaly partially visualized. Hepatobiliary: Limited noncontrast evaluation of the liver is unremarkable. Gallbladder surgically absent. No biliary dilatation. Pancreas: Pancreas somewhat atrophic in appearance. No acute peripancreatic inflammation. Spleen: Spleen within normal limits. Adrenals/Urinary Tract: Left adrenal adenoma noted, stable. Asymmetric atrophy of the left kidney is compared to the right, similar to previous. No nephrolithiasis or hydronephrosis. No hydroureter. Bladder largely decompressed with a Foley catheter in place. Gas lucency within the bladder lumen consistent with catheterization. Stomach/Bowel: Small hiatal hernia. Stomach otherwise unremarkable. Hazy inflammatory stranding about a loop of small bowel within the left abdomen (series 3, image 44), suggesting possible acute enteritis. Small bowel of normal caliber without obstruction. Colon diffusely decompressed. Extensive sigmoid diverticulosis without evidence for acute diverticulitis. Vascular/Lymphatic: Moderate to advanced aorto bi-iliac atherosclerotic disease. No aneurysm. No pathologically enlarged intra-abdominal or pelvic lymph nodes. Shotty subcentimeter mesenteric lymph nodes noted within the area of inflammatory stranding within  the mid abdomen, which may be reactive. Reproductive: Uterus within normal limits. Calcification noted at the left ovary. 2.5 cm simple right adnexal cyst, most likely benign given size and appearance. Other: No free air or fluid. Small fat containing paraumbilical hernia without associated inflammation. Musculoskeletal: Asymmetric hazy stranding within the partially visualized subcutaneous fat of the left buttock (series 3, image 91). No loculated collections. Left hip arthroplasty in place. Degenerative changes at the right hip. No acute osseus abnormality. No worrisome lytic or blastic osseous lesions. Advanced degenerative spondylolysis and facet arthrosis throughout the visualized spine. IMPRESSION: 1. Focal hazy inflammatory stranding adjacent to a loop of small bowel within the left abdomen, suspicious for acute enteritis. No associated obstruction or other complication identified. 2. Asymmetric hazy inflammatory stranding within the subcutaneous fat of the left buttock, nonspecific, but could reflect sequelae of acute cellulitis. Correlation with physical exam recommended. No discrete collections identified. 3. Sigmoid diverticulosis without evidence for acute diverticulitis. 4. Cardiomegaly with advanced aorto bi-iliac atherosclerotic disease. 5. 7 mm nodule at  the right lung base, increased in prominence as compared to 2012. Non-contrast chest CT at 6-12 months is recommended. If the nodule is stable at time of repeat CT, then future CT at 18-24 months (from today's scan) is considered optional for low-risk patients, but is recommended for high-risk patients. This recommendation follows the consensus statement: Guidelines for Management of Incidental Pulmonary Nodules Detected on CT Images: From the Fleischner Society 2017; Radiology 2017; 284:228-243. Electronically Signed   By: Rise Mu M.D.   On: 06/27/2018 02:06   Dg Hand 2 View Left  Result Date: 07/09/2018 CLINICAL DATA:  Trauma and  thumb pain, initial encounter EXAM: LEFT HAND - 2 VIEW COMPARISON:  None. FINDINGS: Degenerative changes of the first [facility] joint are noted. No acute fracture or dislocation is seen. Mild soft tissue swelling about the wrist is seen. IMPRESSION: No acute fracture is noted. Degenerative changes of the first [facility] joint are seen. Electronically Signed   By: Alcide Clever M.D.   On: 07/09/2018 23:02   Dg Chest Port 1 View  Result Date: 06/29/2018 CLINICAL DATA:  Respiratory failure. EXAM: PORTABLE CHEST 1 VIEW COMPARISON:  One-view chest x-ray 06/27/2018. FINDINGS: The heart is enlarged. Aortic atherosclerosis is present. There is no edema or effusion. Pulmonary vascular congestion is stable. No significant airspace consolidation is present. IMPRESSION: 1. Stable cardiomegaly and mild pulmonary vascular congestion. 2. Aortic atherosclerosis. Electronically Signed   By: Marin Roberts M.D.   On: 06/29/2018 07:38   Dg Chest Port 1 View  Result Date: 06/27/2018 CLINICAL DATA:  Cough tonight. EXAM: PORTABLE CHEST 1 VIEW COMPARISON:  Radiograph 05/16/2016 FINDINGS: The heart is enlarged. Indistinct perihilar pulmonary vascularity. No focal airspace disease. No pleural effusion or pneumothorax. No acute osseous abnormalities are seen. IMPRESSION: Cardiomegaly. Indistinct perihilar pulmonary vasculature suggesting vascular congestion and or edema. No pneumonia. Electronically Signed   By: Rubye Oaks M.D.   On: 06/27/2018 01:50      Subjective: Patient seen and examined the pressure this morning.  Remains comfortable.  Hemodynamically stable.  Stable for discharge to CIR today.  No active issues.   Discharge Exam: Vitals:   07/11/18 0514 07/11/18 0700  BP: (!) 94/47 (!) 107/38  Pulse:  67  Resp:    Temp: 98.2 F (36.8 C) 98 F (36.7 C)  SpO2:  95%   Vitals:   07/10/18 1949 07/11/18 0500 07/11/18 0514 07/11/18 0700  BP: (!) 120/48  (!) 94/47 (!) 107/38  Pulse: 76   67  Resp: (!) 21      Temp: 98.9 F (37.2 C)  98.2 F (36.8 C) 98 F (36.7 C)  TempSrc: Oral  Oral Oral  SpO2: 96%   95%  Weight:  104.6 kg    Height:        General: Pt is alert, awake, not in acute distress Cardiovascular: RRR, S1/S2 +, no rubs, no gallops Respiratory: CTA bilaterally, no wheezing, no rhonchi Abdominal: Soft, NT, ND, bowel sounds +,Colostomy,SPT Extremities: no edema, no cyanosis,sacral wound wrapped with dressing    The results of significant diagnostics from this hospitalization (including imaging, microbiology, ancillary and laboratory) are listed below for reference.     Microbiology: Recent Results (from the past 240 hour(s))  Culture, Urine     Status: None   Collection Time: 07/07/18  8:53 AM  Result Value Ref Range Status   Specimen Description URINE, SUPRAPUBIC  Final   Special Requests Normal  Final   Culture   Final  NO GROWTH Performed at [facility], [address], [place], [place] [address]    Report Status 07/08/2018 FINAL  Final     Labs: BNP (last 3 results) No results for input(s): BNP in the last 8760 hours. Basic Metabolic Panel: Recent Labs  Lab 07/06/18 0405 07/07/18 0309 07/08/18 0634 07/09/18 0249 07/11/18 0323  NA 144 142 141 141 137  K 4.0 4.2 3.9 4.0 3.8  CL 117* 115* 112* 107 101  CO2 18* 21* 23 25 28   GLUCOSE 130* 123* 114* 124* 122*  BUN 31* 26* 23 25* 24*  CREATININE 1.33* 1.17* 1.05* 1.08* 1.09*  CALCIUM 8.2* 8.1* 7.8* 8.1* 7.9*   Liver Function Tests: No results for input(s): AST, ALT, ALKPHOS, BILITOT, PROT, ALBUMIN in the last 168 hours. No results for input(s): LIPASE, AMYLASE in the last 168 hours. No results for input(s): AMMONIA in the last 168 hours. CBC: Recent Labs  Lab 07/05/18 0628 07/07/18 0309 07/11/18 0323  WBC 16.9* 15.2* 10.2  HGB 8.1* 8.3* 8.3*  HCT 25.2* 26.6* 26.6*  MCV 95.5 96.7 94.3  PLT 125* 126* 164   Cardiac Enzymes: No results for input(s): CKTOTAL, CKMB, CKMBINDEX, TROPONINI in  the last 168 hours. BNP: Invalid input(s): POCBNP CBG: No results for input(s): GLUCAP in the last 168 hours. D-Dimer No results for input(s): DDIMER in the last 72 hours. Hgb A1c No results for input(s): HGBA1C in the last 72 hours. Lipid Profile No results for input(s): CHOL, HDL, LDLCALC, TRIG, CHOLHDL, LDLDIRECT in the last 72 hours. Thyroid function studies No results for input(s): TSH, T4TOTAL, T3FREE, THYROIDAB in the last 72 hours.  Invalid input(s): FREET3 Anemia work up No results for input(s): VITAMINB12, FOLATE, FERRITIN, TIBC, IRON, RETICCTPCT in the last 72 hours. Urinalysis    Component Value Date/Time   COLORURINE YELLOW 06/27/2018 0043   APPEARANCEUR HAZY (A) 06/27/2018 0043   LABSPEC 1.012 06/27/2018 0043   PHURINE 5.0 06/27/2018 0043   GLUCOSEU NEGATIVE 06/27/2018 0043   HGBUR NEGATIVE 06/27/2018 0043   BILIRUBINUR NEGATIVE 06/27/2018 0043   KETONESUR NEGATIVE 06/27/2018 0043   PROTEINUR NEGATIVE 06/27/2018 0043   NITRITE NEGATIVE 06/27/2018 0043   LEUKOCYTESUR SMALL (A) 06/27/2018 0043   Sepsis Labs Invalid input(s): PROCALCITONIN,  WBC,  LACTICIDVEN Microbiology Recent Results (from the past 240 hour(s))  Culture, Urine     Status: None   Collection Time: 07/07/18  8:53 AM  Result Value Ref Range Status   Specimen Description URINE, SUPRAPUBIC  Final   Special Requests Normal  Final   Culture   Final    NO GROWTH Performed at [facility], [address] [place], [place] [address]    Report Status 07/08/2018 FINAL  Final    Please note: You were cared for by a hospitalist during your hospital stay. Once you are discharged, your primary care physician will handle any further medical issues. Please note that NO REFILLS for any discharge medications will be authorized once you are discharged, as it is imperative that you return to your primary care physician (or establish a relationship with a primary care physician if you do not have one) for  your post hospital discharge needs so that they can reassess your need for medications and monitor your lab values.    Time coordinating discharge: 40 minutes  SIGNED:   Burnadette Pop, MD  [facility] 07/11/2018, 9:05 AM Pager [phone]  If 7PM-7AM, please contact night-coverage [email or web address] Password [email or web address]

## 2018-07-11 NOTE — Plan of Care (Signed)
Care plan reviewed and patient is adequate for discharge to IP rehab.

## 2018-07-11 NOTE — H&P (Signed)
Physical Medicine and Rehabilitation Admission H&P        Chief Complaint  Patient presents with  . Weakness  : HPI: [name] is an 84 year old right-handed female with history of hypo-thyroidism, OSA on CPAP, morbid obesity.  Per chart review patient lives with her grandson in [place].  Used a cane prior to admission due to end-stage osteoarthritis of the hip.  She has multiple family members with good support.  Presented 06/26/2018 with diarrhea with black tarry stool and abdominal pain as well as severe hypotension.  Patient noted poor appetite for the past few days.  WBC 16,100, creatinine 5.6, hemoglobin 10.7, fecal occult blood positive, troponin negative, lactic acid 2.96.  Echocardiogram with ejection fraction of 81% grade 2 diastolic dysfunction.  CT of abdomen and pelvis showed focal hazy inflammatory stranding adjacent to a loop of small bowel within the left abdomen suspicious for acute enteritis.  No associated obstruction.  Sigmoid diverticulosis without evidence for acute diverticulitis.  7 mm nodule left lung base increased in prominence as compared to 2012.WOC follow-up for 3 dark and necrotic areas present on the left buttocks with surrounding erythema consistent with cellulitis extending all the way to her left labia.  Underwent excisional debridement of left gluteal necrotizing soft tissue infection 06/27/2018 per Dr. Dema Severin followed by debridement of large sacral perineal and sacral wound 06/29/2018 per Dr. Brantley Stage.  Urology consulted 07/01/2018 due to ongoing urinary incontinence which slowly progressed and later underwent suprapubic tube placement 07/03/2018 per Dr. Gloriann Loan as well as laparoscopic diverting loop colostomy 07/03/2018 per Dr. Rosendo Gros.  There were some issues of urine leaking around suprapubic tube with urology to follow-up on question need to change suprapubic.  Nephrology service is consulted in regards to severe AKI felt to be related to hypotension responding to IV  fluids.  No current plan for diagnostic GI work-up noted.  Hospital course pain management.  Urine culture 06/27/2018 showed Enterobacter sensitive to Zosyn later changed to Augmentin x10-day course and completed..  Receiving hydrotherapy for significant sacral wounds.  Subcutaneous Lovenox for DVT prophylaxis.  Physical and occupational therapy evaluations completed with recommendations of physical medicine rehab consult.  Patient was admitted for a comprehensive rehab program.   Review of Systems  Constitutional: Positive for fever. Negative for chills.  HENT: Negative for hearing loss.   Eyes: Negative for blurred vision and double vision.  Respiratory: Positive for shortness of breath. Negative for cough.   Cardiovascular: Positive for palpitations and leg swelling.       Anginal chest pain  Gastrointestinal: Positive for blood in stool.       GERD  Genitourinary: Negative for dysuria, flank pain and hematuria.       Urinary incontinence.  Musculoskeletal: Positive for joint pain and myalgias.  Skin: Negative for rash.  Psychiatric/Behavioral: Positive for depression.  All other systems reviewed and are negative.       Past Medical History:  Diagnosis Date  . Chest pain    . Depression    . GERD (gastroesophageal reflux disease)    . Hypertension    . Sleep apnea    . Thyroid disease           Past Surgical History:  Procedure Laterality Date  . APPENDECTOMY   1946  . CATARACT EXTRACTION        bilateral  . GALLBLADDER SURGERY      . INCISION AND DRAINAGE ABSCESS N/A 06/29/2018    Procedure: REPEAT INCISION AND DRAINAGE  BUTTOCK  ABSCESS;  Surgeon: Erroll Luna, MD;  Location: [facility];  Service: General;  Laterality: N/A;  . INCISION AND DRAINAGE PERIRECTAL ABSCESS Left 06/27/2018    Procedure: IRRIGATION AND DEBRIDEMENT BUTTOCK ABSCESS;  Surgeon: Ileana Roup, MD;  Location: [facility];  Service: General;  Laterality: Left;  . INSERTION OF SUPRAPUBIC CATHETER N/A 07/03/2018     Procedure: INSERTION OF SUPRAPUBIC CATHETER;  Surgeon: Lucas Mallow, MD;  Location: [facility];  Service: Urology;  Laterality: N/A;  . KNEE ARTHROPLASTY   2006    left   . KNEE ARTHROPLASTY   2006    right   . LAPAROSCOPIC DIVERTED COLOSTOMY N/A 07/03/2018    Procedure: LAPAROSCOPIC DIVERTED COLOSTOMY;  Surgeon: Ralene Ok, MD;  Location: [facility];  Service: General;  Laterality: N/A;  . SHOULDER ARTHROSCOPY   2009    right  . TOTAL HIP ARTHROPLASTY   2002     left         Family History  Problem Relation Age of Onset  . Diabetes Mother    . Stroke Mother    . Cancer Brother    . Kidney disease Sister      Social History:  reports that she has quit smoking. She has never used smokeless tobacco. She reports that she does not drink alcohol or use drugs. Allergies:       Allergies  Allergen Reactions  . Morphine And Related Hives and Itching  . Gabapentin Other (See Comments)      Alters mood  . Lipitor [Atorvastatin] Itching and Rash  . Lyrica [Pregabalin] Other (See Comments)      Lethargic, mood changes  . Sulfa Antibiotics Rash          Medications Prior to Admission  Medication Sig Dispense Refill  . albuterol (PROVENTIL HFA;VENTOLIN HFA) 108 (90 Base) MCG/ACT inhaler Inhale 2 puffs into the lungs every 6 (six) hours as needed for wheezing or shortness of breath.      . allopurinol (ZYLOPRIM) 100 MG tablet Take 200 mg by mouth daily.       Marland Kitchen ALPRAZolam (XANAX) 0.5 MG tablet Take 0.5 mg by mouth at bedtime.      . furosemide (LASIX) 80 MG tablet Take 40-80 mg by mouth See admin instructions. Take 1 tablet by mouth every other day alternating with 1/2 tablet. (ex. 26m, 458m [id number]..Marland Kitchen      . levothyroxine (SYNTHROID, LEVOTHROID) 137 MCG tablet Take 137 mcg by mouth daily before breakfast.      . losartan (COZAAR) 100 MG tablet Take 100 mg by mouth daily.      . omMarland Kitchenprazole (PRILOSEC) 40 MG capsule Take 40 mg by mouth daily.      . sertraline (ZOLOFT) 50 MG  tablet Take 50 mg by mouth daily.      . calcium citrate-vitamin D (CITRACAL+D) 315-200 MG-UNIT per tablet Take 1 tablet by mouth daily.      . guMarland KitcheniFENesin (MUCINEX) 600 MG 12 hr tablet Take 1 tablet (600 mg total) by mouth 2 (two) times daily as needed for cough or to loosen phlegm. (Patient not taking: Reported on 06/27/2018) 45 tablet 0  . Misc Natural Products (GLUCOSAMINE CHONDROITIN VIT D3) CAPS Take 1 capsule by mouth daily.       . Multiple Vitamin (MULTIVITAMIN WITH MINERALS) TABS tablet Take 1 tablet by mouth daily.      . nitroGLYCERIN (NITROSTAT) 0.4 MG SL tablet Place 0.4 mg under the tongue every 5 (five)  minutes as needed for chest pain.          Drug Regimen Review Drug regimen was reviewed and remains appropriate with no significant issues identified   Home: Home Living Family/patient expects to be discharged to:: Inpatient rehab Living Arrangements: Other (Comment)(pt hopeful to inpt rehab but will d/c to daughters home foll)   Functional History: Prior Function Level of Independence: Independent   Functional Status:  Mobility: Bed Mobility Overal bed mobility: Needs Assistance Bed Mobility: Sidelying to Sit Rolling: Mod assist, +2 for physical assistance Sidelying to sit: Max assist, +2 for physical assistance Supine to sit: Mod assist, +2 for physical assistance Sit to supine: Mod assist, +2 for physical assistance Sit to sidelying: Mod assist, Max assist, +2 for physical assistance General bed mobility comments: assist for legs off bed and to lift trunk with cues (attempted to roll to R, but pt unable to tolerate), to supine assist for trunk and legs(vc for technique to maximize body mechanics) Transfers Overall transfer level: Needs assistance Equipment used: Rolling walker (2 wheeled) Transfers: Sit to/from Stand Sit to Stand: Max assist, +2 physical assistance, Mod assist General transfer comment: cues for hand placement, assist for lifting x 2 trials with  momentum(vc for RW management) Ambulation/Gait Ambulation/Gait assistance: Min assist, +2 physical assistance Gait Distance (Feet): 25 Feet Assistive device: Rolling walker (2 wheeled) Gait Pattern/deviations: Step-to pattern, Shuffle, Decreased stride length, Wide base of support General Gait Details: Able to ambulate with RW with cues for technique out to hallway.  Had to have chair follow and sat down once in hallway.  Gait velocity interpretation: <1.31 ft/sec, indicative of household ambulator   ADL: ADL Overall ADL's : Needs assistance/impaired Eating/Feeding: Set up, Bed level Grooming: Wash/dry face, Set up, Bed level Upper Body Bathing: Minimal assistance, Sitting Lower Body Bathing: Total assistance, +2 for safety/equipment, Sit to/from stand Upper Body Dressing : Minimal assistance, Sitting Lower Body Dressing: Total assistance, +2 for physical assistance, +2 for safety/equipment, Sit to/from stand Toilet Transfer: (N/A, foley and ostomy ) Functional mobility during ADLs: +2 for physical assistance, +2 for safety/equipment, Maximal assistance, Rolling walker   Cognition: Cognition Overall Cognitive Status: Within Functional Limits for tasks assessed Orientation Level: Oriented X4 Cognition Arousal/Alertness: Awake/alert Behavior During Therapy: Flat affect Overall Cognitive Status: Within Functional Limits for tasks assessed   Physical Exam: Blood pressure 125/61, pulse 79, temperature 97.9 F (36.6 C), temperature source Oral, resp. rate 17, height 5' 4"  (1.626 m), weight 107.2 kg, SpO2 96 %. Physical Exam  Vitals reviewed. Constitutional:  84 year old right-handed obese female  HENT:  Head: Normocephalic.  Eyes: EOM are normal.  Neck: Normal range of motion. Neck supple. No thyromegaly present.  Cardiovascular: Normal rate and regular rhythm.  Respiratory: Effort normal and breath sounds normal. No respiratory distress.  GI: Soft. Bowel sounds are normal. She  exhibits no distension.  Colostomy in place  Genitourinary:  Genitourinary Comments: Suprapubic tube in place  Neurological:  Patient is alert and oriented.  Mood is flat but appropriate.  Follows commands  Skin:  Large sacral wound   4/5 in BUE, 3/5 BLE Sensation intact in BUE and BLE   Lab Results Last 48 Hours        Results for orders placed or performed during the hospital encounter of 06/26/18 (from the past 48 hour(s))  Basic metabolic panel     Status: Abnormal    Collection Time: 07/09/18  2:49 AM  Result Value Ref Range    Sodium 141  135 - 145 mmol/L    Potassium 4.0 3.5 - 5.1 mmol/L    Chloride 107 98 - 111 mmol/L    CO2 25 22 - 32 mmol/L    Glucose, Bld 124 (H) 70 - 99 mg/dL    BUN 25 (H) 8 - 23 mg/dL    Creatinine, Ser 1.08 (H) 0.44 - 1.00 mg/dL    Calcium 8.1 (L) 8.9 - 10.3 mg/dL    GFR calc non Af Amer 44 (L) >60 mL/min    GFR calc Af Amer 52 (L) >60 mL/min      Comment: (NOTE) The eGFR has been calculated using the CKD EPI equation. This calculation has not been validated in all clinical situations. eGFR's persistently <60 mL/min signify possible Chronic Kidney Disease.      Anion gap 9 5 - 15      Comment: Performed at [facility] [address], [place], [place] [address]       Imaging Results (Last 48 hours)  Dg Hand 2 View Left   Result Date: 07/09/2018 CLINICAL DATA:  Trauma and thumb pain, initial encounter EXAM: LEFT HAND - 2 VIEW COMPARISON:  None. FINDINGS: Degenerative changes of the first [facility] joint are noted. No acute fracture or dislocation is seen. Mild soft tissue swelling about the wrist is seen. IMPRESSION: No acute fracture is noted. Degenerative changes of the first [facility] joint are seen. Electronically Signed   By: Inez Catalina M.D.   On: 07/09/2018 23:02             Medical Problem List and Plan: 1.  Debility secondary to sepsis from Fournier gangrene.  Status post debridement of left gluteal necrotizing soft tissue infection  06/27/2018 followed by debridement of large sacral perineal wound 06/29/2018, status post diverting colostomy placement and status post suprapubic placement 07/03/2018. 2.  DVT Prophylaxis/Anticoagulation: Subcutaneous Lovenox.  Check vascular study 3. Pain Management: Ultram as needed 4. Mood: Zoloft 50 mg daily 5. Neuropsych: This patient is capable of making decisions on her own behalf. 6. Skin/Wound Care: Skin care as directed 7. Fluids/Electrolytes/Nutrition: Routine in and outs with follow-up chemistries 8.  ID.  Augmentin completed 9.  Acute on chronic grade 2 diastolic dysfunction.  Monitor for any signs of fluid overload.  Continue Lasix 10.  AKI.  Resolving.  Felt to be related to hypotension.  Follow-up chemistries 11.  Hypothyroidism.  Synthroid 12.  Morbid obesity.  BMI 40.58.  Dietary follow-up 13.  OSA-CPAP    Post Admission Physician Evaluation: 1. Functional deficits secondary  to debility. 2. Patient admitted to receive collaborative, interdisciplinary care between the physiatrist, rehab nursing staff, and therapy team. 3. Patient's level of medical complexity and substantial therapy needs in context of that medical necessity cannot be provided at a lesser intensity of care. 4. Patient has experienced substantial functional loss from his/her baseline.Judging by the patient's diagnosis, physical exam, and functional history, the patient has potential for functional progress which will result in measurable gains while on inpatient rehab.  These gains will be of substantial and practical use upon discharge in facilitating mobility and self-care at the household level. 5. Physiatrist will provide 24 hour management of medical needs as well as oversight of the therapy plan/treatment and provide guidance as appropriate regarding the interaction of the two. 6. 24 hour rehab nursing will assist in the management of  safety, skin/wound care, disease management, medication administration, pain  management, patient education and colostomy and suprapubic catheter management  and  help integrate therapy concepts, techniques,education, etc. 7. PT will assess and treat for:  Marland Kitchen  Goals are: minimal assist. 8. OT will assess and treat for  .  Goals are: minimal assist.  9. SLP will assess and treat for  .  Goals are: N/A. 10. Case Management and Social Worker will assess and treat for psychological issues and discharge planning. 11. Team conference will be held weekly to assess progress toward goals and to determine barriers to discharge. 12.  Patient will receive at least 3 hours of therapy per day at least 5 days per week. 13. ELOS and Prognosis: 14-17d fair    "I have personally performed a face to face diagnostic evaluation of this patient.  Additionally, I have reviewed and concur with the physician assistant's documentation above."  Charlett Blake M.D. [facility] FAAPM&R (Sports Med, Neuromuscular Med) Diplomate Am Board of Electrodiagnostic Med  Elizabeth Sauer 07/10/2018

## 2018-07-11 NOTE — Plan of Care (Signed)
Care plans reviewed and patient is progressing.  

## 2018-07-11 NOTE — Progress Notes (Signed)
Inpatient Rehabilitation-Admissions Coordinator   [facility]. AC has updated pt, family, floor RN, CM, and SW regarding plan. Please call if questions.   Nanine MeansKelly Calirose Mccance, OTR/L  Rehab Admissions Coordinator  [phone] 07/11/2018 10:34 AM

## 2018-07-11 NOTE — Progress Notes (Signed)
Physical Medicine and Rehabilitation Consult Reason for Consult: Decreased functional mobility Referring Physician: Family medicine   HPI: Mickie KayGracie B Davtyan is a 84 y.o. right-handed female with history of hypothyroidism, OSA on CPAP.  Per chart review and patient, patient lives with grandson in [place].  Used a cane prior to admission due to end-stage osteoarthritis of the hip.  She has multiple family members in the area who plan to assist as needed.  Presented 06/26/2018 with diarrhea with black tarry stool and abdominal pain as well as severe hypotension.  Patient noted poor appetite for the past few days.  WBC 16,100, creatinine 5.6, hemoglobin 10.7, fecal occult blood positive, troponin negative, lactic acid elevated 2.96.  Echocardiogram with ejection fraction of 55% grade 2 diastolic dysfunction.  CT of abdomen and pelvis showed focal hazy inflammatory stranding adjacent to a loop of small bowel within the left abdomen suspicious for acute enteritis.  No associated obstruction.  Sigmoid diverticulosis without evidence for acute diverticulitis.  7 mm nodule at left lung base increased in prominence as compared to 2012. WOC follow-up for 3 dark and necrotic areas present on the left buttocks with surrounding erythema consistent with cellulitis extending all the way to her left labia.  Underwent excisional debridement of left gluteal necrotizing soft tissue infection 06/27/2018 per Dr. Cliffton AstersWhite followed by debridement of large sacral perineal and sacral wound 06/29/2018 per Dr. Luisa Hartornett.  Urology consulted 07/01/2018 due to ongoing urinary incontinence which slowly progressed and underwent suprapubic tube placement 07/03/2018 per Dr. Alvester MorinBell as well as a laparoscopic diverting loop colostomy 07/03/2018 per Dr. Derrell Lollingamirez.  Nephrology services consulted in regards to severe AKI felt to be related to hypotension and responding to IV fluids.  No current plan for diagnostic GI work-up noted.  Hospital course pain management.   Intravenous Zosyn recently discontinued for necrotizing soft tissue infection changed to Augmentin.  Latest blood cultures negative to date.  Patient is currently receiving hydrotherapy's for sacral wounds.  Therapy evaluations completed with recommendations of physical medicine rehab consult.  Review of Systems  Constitutional: Negative for chills and fever.  HENT: Negative for hearing loss.   Eyes: Negative for blurred vision and double vision.  Respiratory: Positive for cough and shortness of breath.   Cardiovascular: Negative for chest pain and palpitations.  Gastrointestinal: Positive for abdominal pain, diarrhea and vomiting.       GERD  Musculoskeletal: Positive for joint pain and myalgias.  Skin: Negative for rash.  Psychiatric/Behavioral: Positive for depression.  All other systems reviewed and are negative.      Past Medical History:  Diagnosis Date  . Chest pain   . Depression   . GERD (gastroesophageal reflux disease)   . Hypertension   . Sleep apnea   . Thyroid disease         Past Surgical History:  Procedure Laterality Date  . APPENDECTOMY  1946  . CATARACT EXTRACTION     bilateral  . GALLBLADDER SURGERY    . INCISION AND DRAINAGE ABSCESS N/A 06/29/2018   Procedure: REPEAT INCISION AND DRAINAGE  BUTTOCK ABSCESS;  Surgeon: Harriette Bouillonornett, Thomas, MD;  Location: [facility] OR;  Service: General;  Laterality: N/A;  . INCISION AND DRAINAGE PERIRECTAL ABSCESS Left 06/27/2018   Procedure: IRRIGATION AND DEBRIDEMENT BUTTOCK ABSCESS;  Surgeon: Andria MeuseWhite, Christopher M, MD;  Location: [facility] OR;  Service: General;  Laterality: Left;  . INSERTION OF SUPRAPUBIC CATHETER N/A 07/03/2018   Procedure: INSERTION OF SUPRAPUBIC CATHETER;  Surgeon: Crista ElliotBell, Eugene D III, MD;  [facility];  Service: Urology;  Laterality: N/A;  . KNEE ARTHROPLASTY  2006   left   . KNEE ARTHROPLASTY  2006   right   . LAPAROSCOPIC DIVERTED COLOSTOMY N/A 07/03/2018   Procedure: LAPAROSCOPIC DIVERTED  COLOSTOMY;  Surgeon: Axel Filleramirez, Armando, MD;  [facility];  Service: General;  Laterality: N/A;  . SHOULDER ARTHROSCOPY  2009   right  . TOTAL HIP ARTHROPLASTY  2002    left        Family History  Problem Relation Age of Onset  . Diabetes Mother   . Stroke Mother   . Cancer Brother   . Kidney disease Sister    Social History:  reports that she has quit smoking. She has never used smokeless tobacco. She reports that she does not drink alcohol or use drugs. Allergies:       Allergies  Allergen Reactions  . Morphine And Related Hives and Itching  . Gabapentin Other (See Comments)    Alters mood  . Lipitor [Atorvastatin] Itching and Rash  . Lyrica [Pregabalin] Other (See Comments)    Lethargic, mood changes  . Sulfa Antibiotics Rash         Medications Prior to Admission  Medication Sig Dispense Refill  . albuterol (PROVENTIL HFA;VENTOLIN HFA) 108 (90 Base) MCG/ACT inhaler Inhale 2 puffs into the lungs every 6 (six) hours as needed for wheezing or shortness of breath.    . allopurinol (ZYLOPRIM) 100 MG tablet Take 200 mg by mouth daily.     Marland Kitchen. ALPRAZolam (XANAX) 0.5 MG tablet Take 0.5 mg by mouth at bedtime.    . furosemide (LASIX) 80 MG tablet Take 40-80 mg by mouth See admin instructions. Take 1 tablet by mouth every other day alternating with 1/2 tablet. (ex. 80mg , 40mg , 80mg , 40mg .....)    . levothyroxine (SYNTHROID, LEVOTHROID) 137 MCG tablet Take 137 mcg by mouth daily before breakfast.    . losartan (COZAAR) 100 MG tablet Take 100 mg by mouth daily.    Marland Kitchen. omeprazole (PRILOSEC) 40 MG capsule Take 40 mg by mouth daily.    . sertraline (ZOLOFT) 50 MG tablet Take 50 mg by mouth daily.    . calcium citrate-vitamin D (CITRACAL+D) 315-200 MG-UNIT per tablet Take 1 tablet by mouth daily.    Marland Kitchen. guaiFENesin (MUCINEX) 600 MG 12 hr tablet Take 1 tablet (600 mg total) by mouth 2 (two) times daily as needed for cough or to loosen phlegm. (Patient not  taking: Reported on 06/27/2018) 45 tablet 0  . Misc Natural Products (GLUCOSAMINE CHONDROITIN VIT D3) CAPS Take 1 capsule by mouth daily.     . Multiple Vitamin (MULTIVITAMIN WITH MINERALS) TABS tablet Take 1 tablet by mouth daily.    . nitroGLYCERIN (NITROSTAT) 0.4 MG SL tablet Place 0.4 mg under the tongue every 5 (five) minutes as needed for chest pain.      Home: Home Living Family/patient expects to be discharged to:: Skilled nursing facility Living Arrangements: Children  Functional History: Functional Status:  Mobility: Bed Mobility Overal bed mobility: Needs Assistance Bed Mobility: Sidelying to Sit, Sit to Sidelying Rolling: Min assist, +2 for physical assistance, +2 for safety/equipment, Total assist Sidelying to sit: Mod assist, +2 for physical assistance Sit to sidelying: Mod assist, Max assist, +2 for physical assistance General bed mobility comments: assist for legs off bed and to lift trunk with pt pushing up with railing, to sidelying +2 for legs onto bed and to guide shoulders into side position Transfers Overall transfer level: Needs  assistance Equipment used: Rolling walker (2 wheeled) Transfers: Sit to/from Stand Sit to Stand: Mod assist, +2 physical assistance, From elevated surface General transfer comment: lifting assist from slightly elevated bed; pt pulls up on walker, cues for technique, performed x 2 trials Ambulation/Gait Ambulation/Gait assistance: Min assist, +2 physical assistance Gait Distance (Feet): 2 Feet Assistive device: Rolling walker (2 wheeled) Gait Pattern/deviations: Step-to pattern, Decreased stride length, Trunk flexed General Gait Details: side steps to [facility] during first standing bout, then marching in place, noted R hip audible crepitus with standing and stepping activities  ADL:  Cognition: Cognition Overall Cognitive Status: Within Functional Limits for tasks assessed Orientation Level: (P) Oriented  X4 Cognition Arousal/Alertness: Awake/alert Behavior During Therapy: WFL for tasks assessed/performed Overall Cognitive Status: Within Functional Limits for tasks assessed  Blood pressure 108/74, pulse 61, temperature 97.8 F (36.6 C), temperature source Oral, resp. rate (!) 23, height 5\' 4"  (1.626 m), weight 115.3 kg (254 lb 3.1 oz), SpO2 97 %. Physical Exam  Vitals reviewed. Constitutional: She is oriented to person, place, and time. She appears well-developed.  84 year old right-handed obese female  HENT:  Head: Normocephalic and atraumatic.  Eyes: EOM are normal. Right eye exhibits no discharge. Left eye exhibits no discharge.  Neck: Normal range of motion. Neck supple. No thyromegaly present.  Cardiovascular: Normal rate and regular rhythm.  Respiratory: Effort normal and breath sounds normal. No respiratory distress.  GI: Soft. Bowel sounds are normal.  Colostomy in place  Genitourinary:  Genitourinary Comments: Suprapubic tube in place  Musculoskeletal:  No edema or tenderness in extremities  Neurological: She is alert and oriented to person, place, and time.  Mood is flat but appropriate. Motor: B/l UE 4-/5 proximal to distal LLE: 4-4+/5 proximal to distal RLE: HF 2+/5, KE 3-/5, ADF 4-/5 Sensation intact to light touch  Skin:  Sacral buttocks decubitus not examined  Psychiatric: She has a normal mood and affect. Her behavior is normal.    LabResultsLast24Hours       Results for orders placed or performed during the hospital encounter of 06/26/18 (from the past 24 hour(s))  CBC     Status: Abnormal   Collection Time: 07/03/18  3:57 PM  Result Value Ref Range   WBC 26.0 (H) 4.0 - 10.5 K/uL   RBC 3.09 (L) 3.87 - 5.11 MIL/uL   Hemoglobin 9.4 (L) 12.0 - 15.0 g/dL   HCT 60.4 (L) 54.0 - 98.1 %   MCV 97.4 78.0 - 100.0 fL   MCH 30.4 26.0 - 34.0 pg   MCHC 31.2 30.0 - 36.0 g/dL   RDW 19.1 (H) 47.8 - 29.5 %   Platelets 125 (L) 150 - 400 K/uL  CBC      Status: Abnormal   Collection Time: 07/04/18  3:18 AM  Result Value Ref Range   WBC 19.8 (H) 4.0 - 10.5 K/uL   RBC 2.86 (L) 3.87 - 5.11 MIL/uL   Hemoglobin 8.7 (L) 12.0 - 15.0 g/dL   HCT 62.1 (L) 30.8 - 65.7 %   MCV 96.2 78.0 - 100.0 fL   MCH 30.4 26.0 - 34.0 pg   MCHC 31.6 30.0 - 36.0 g/dL   RDW 84.6 (H) 96.2 - 95.2 %   Platelets 128 (L) 150 - 400 K/uL  Basic metabolic panel     Status: Abnormal   Collection Time: 07/04/18  3:18 AM  Result Value Ref Range   Sodium 142 135 - 145 mmol/L   Potassium 4.2 3.5 - 5.1 mmol/L  Chloride 113 (H) 98 - 111 mmol/L   CO2 15 (L) 22 - 32 mmol/L   Glucose, Bld 147 (H) 70 - 99 mg/dL   BUN 38 (H) 8 - 23 mg/dL   Creatinine, Ser 4.09 (H) 0.44 - 1.00 mg/dL   Calcium 8.1 (L) 8.9 - 10.3 mg/dL   GFR calc non Af Amer 28 (L) >60 mL/min   GFR calc Af Amer 33 (L) >60 mL/min   Anion gap 14 5 - 15     ImagingResults(Last48hours)  No results found.    Assessment/Plan: Diagnosis: Debility Labs independently reviewed.  Records reviewed and summated above.  1. Does the need for close, 24 hr/day medical supervision in concert with the patient's rehab needs make it unreasonable for this patient to be served in a less intensive setting? Yes  2. Co-Morbidities requiring supervision/potential complications: hypothyroidism (cont meds, ensure appropriate mood and energy level for therapies), OSA (cont CPAP, monitor for daytime somnolence), diastolic dysfunction (monitor for sign/symptoms of fluid overload), AKI (avoid nephrotoxic meds), post-op pain management (Biofeedback training with therapies to help reduce reliance on opiate pain medications, particularly IV fentanyl, monitor pain control during therapies, and sedation at rest and titrate to maximum efficacy to ensure participation and gains in therapies), tachypnea (monitor RR and O2 Sats with increased physical exertion), steroid induced hyperglycemia (Monitor in accordance with  exercise and adjust meds as necessary), leukocytosis (cont to monitor for signs and symptoms of infection, further workup if indicated) 3. Due to safety, skin/wound care, disease management, pain management and patient education, does the patient require 24 hr/day rehab nursing? Yes 4. Does the patient require coordinated care of a physician, rehab nurse, PT (1-2 hrs/day, 5 days/week) and OT (1-2 hrs/day, 5 days/week) to address physical and functional deficits in the context of the above medical diagnosis(es)? Yes Addressing deficits in the following areas: balance, endurance, locomotion, strength, transferring, bathing, dressing, toileting and psychosocial support 5. Can the patient actively participate in an intensive therapy program of at least 3 hrs of therapy per day at least 5 days per week? Yes 6. The potential for patient to make measurable gains while on inpatient rehab is excellent 7. Anticipated functional outcomes upon discharge from inpatient rehab are min assist  with PT, min assist and mod assist with OT, n/a with SLP. 8. Estimated rehab length of stay to reach the above functional goals is: 14-17 days. 9. Anticipated D/C setting: Home 10. Anticipated post D/C treatments: HH therapy and Home excercise program 11. Overall Rehab/Functional Prognosis: good  RECOMMENDATIONS: This patient's condition is appropriate for continued rehabilitative care in the following setting: CIR when medically stable and able to tolerate 3 hours of therapy/day. Patient has agreed to participate in recommended program. Yes Note that insurance prior authorization may be required for reimbursement for recommended care.  Comment: Rehab Admissions Coordinator to follow up.   I have personally performed a face to face diagnostic evaluation, including, but not limited to relevant history and physical exam findings, of this patient and developed relevant assessment and plan.  Additionally, I have reviewed  and concur with the physician assistant's documentation above.   Maryla Morrow, MD, ABPMR Mcarthur Rossetti Angiulli, PA-C 07/04/2018        Revision History                   Routing History

## 2018-07-11 NOTE — Progress Notes (Signed)
PMR Admission Coordinator Pre-Admission Assessment  Patient: [name] is an 84 y.o., female MRN: [id number] DOB: [birth year] Height: 5' 4"  (162.6 cm) Weight: 104.6 kg                                                                                                                                                  Insurance Information HMO: Yes    PPO:      PCP:      IPA:      80/20:      OTHER: (Plan Type: AARP Med Complete Plan 1) PRIMARY: UHC Medicare      Policy#: [id number]      Subscriber: Patient CM Name: Vevelyn Royals       Phone#: [phone]     Fax#: [phone] Auth received from [place] at [facility] for admit date 07/11/18; Clinical updates due to Vevelyn Royals on day 7 (05/13/06) Pre-Cert#: [id number]      Employer:  Benefits:  Phone #: NA     Name: [email or web address] Eff. Date: 11/28/17     Deduct: $0      Out of Pocket Max: $4,400 (Met 154.15)      Life Max: NA CIR: $345/day for days 1-5; $0/day for days 6+      SNF: $0/day for days 1-20, $160/day for days 21-48, $0/day for days 49-100 Outpatient: per necessity     Co-Pay: $40/visit  Home Health: 100%      Co-Pay:  DME: 80%     Co-Pay: 20% Providers:   Medicaid Application Date:       Case Manager:  Disability Application Date:       Case Worker:   Emergency Contact Information         Contact Information    Name Relation Home Work Mobile   [place] Daughter [phone]     Lewis,Brenda Daughter [phone]       Current Medical History  Patient Admitting Diagnosis: Debility  History of Present Illness: Estefania B. Losh is an 84 year old right-handed female with history of hypo-thyroidism, OSA on CPAP, morbid obesity. Per chart review patient lives with her grandson in [place]. Used a cane prior to admission due to end-stage osteoarthritis of the hip. She has multiple family members with good support. Presented 06/26/2018 with diarrhea with black tarry stool and abdominal pain as well as severe  hypotension. Patient noted poor appetite for the past few days. WBC 16,100, creatinine 5.6, hemoglobin 10.7, fecal occult blood positive, troponin negative, lactic acid 2.96. Echocardiogram with ejection fraction of 16% grade 2 diastolic dysfunction. CT of abdomen and pelvis showed focal hazy inflammatory stranding adjacent to a loop of small bowel within the left abdomen suspicious for acute enteritis. No associated obstruction. Sigmoid diverticulosis without evidence for acute diverticulitis. 7 mm nodule left lung base increased in prominence as compared to 2012.[place]  for 3 dark and necrotic areas present on the left buttocks with surrounding erythema consistent with cellulitis extending all the way to her left labia. Underwent excisional debridement of left gluteal necrotizing soft tissue infection 06/27/2018 per Dr. Dema Severin followed by debridement of large sacral perineal and sacral wound 06/29/2018 per Dr. Brantley Stage. Urology consulted 07/01/2018 due to ongoing urinary incontinence which slowly progressed and later underwent suprapubic tube placement 07/03/2018 per Dr. Gloriann Loan as well as laparoscopic diverting loop colostomy 07/03/2018 per Dr. Rosendo Gros. There were some issues of urine leaking around suprapubic tube with urology to follow-up on question need to change suprapubic. Nephrology service is consulted in regards to severe AKI felt to be related to hypotension responding to IV fluids. No current plan for diagnostic GI work-up noted. Hospital course pain management. Urine culture 06/27/2018 showed Enterobacter sensitive to Zosyn later changed to Augmentin x10-day courseand completed.. Receiving hydrotherapy for significant sacral wounds. Subcutaneous Lovenox for DVT prophylaxis. Physical and occupational therapy evaluations completed with recommendations of physical medicine rehab consult. Patient is to be admitted for a comprehensive rehab program on 07/11/18.  Past Medical History      Past  Medical History:  Diagnosis Date  . Chest pain   . Depression   . GERD (gastroesophageal reflux disease)   . Hypertension   . Sleep apnea   . Thyroid disease     Family History  family history includes Cancer in her brother; Diabetes in her mother; Kidney disease in her sister; Stroke in her mother.  Prior Rehab/Hospitalizations:  Has the patient had major surgery during 100 days prior to admission? No  Current Medications   Current Facility-Administered Medications:  .  acetaminophen (TYLENOL) tablet 650 mg, 650 mg, Oral, Q6H PRN **OR** acetaminophen (TYLENOL) suppository 650 mg, 650 mg, Rectal, Q6H PRN, Simaan, Elizabeth S, PA-C .  allopurinol (ZYLOPRIM) tablet 100 mg, 100 mg, Oral, Daily, Simaan, Elizabeth S, PA-C, 100 mg at 07/10/18 1217 .  collagenase (SANTYL) ointment, , Topical, BID, Simaan, Elizabeth S, PA-C .  enoxaparin (LOVENOX) injection 40 mg, 40 mg, Subcutaneous, Q24H, Simaan, Elizabeth S, PA-C, 40 mg at 07/10/18 1218 .  feeding supplement (ENSURE ENLIVE) (ENSURE ENLIVE) liquid 237 mL, 237 mL, Oral, BID BM, Oswald Hillock, MD, 237 mL at 07/10/18 2120 .  feeding supplement (PRO-STAT SUGAR FREE 64) liquid 30 mL, 30 mL, Oral, BID, Darrick Meigs, Marge Duncans, MD, 30 mL at 07/10/18 2120 .  fentaNYL (SUBLIMAZE) injection 25 mcg, 25 mcg, Intravenous, Q2H PRN, Jill Alexanders, PA-C, 25 mcg at 07/11/18 0346 .  furosemide (LASIX) tablet 40 mg, 40 mg, Oral, Daily, Darrick Meigs, Marge Duncans, MD, 40 mg at 07/10/18 1218 .  ipratropium-albuterol (DUONEB) 0.5-2.5 (3) MG/3ML nebulizer solution 3 mL, 3 mL, Nebulization, Q6H PRN, Oswald Hillock, MD, 3 mL at 07/07/18 1749 .  levothyroxine (SYNTHROID, LEVOTHROID) tablet 137 mcg, 137 mcg, Oral, QAC breakfast, Jill Alexanders, PA-C, 137 mcg at 07/11/18 [phone] .  liver oil-zinc oxide (DESITIN) 40 % ointment, , Topical, BID, Darrick Meigs, Marge Duncans, MD .  ondansetron [facility]) injection 4 mg, 4 mg, Intravenous, Q6H PRN, Schorr, Rhetta Mura, NP, 4 mg at 07/08/18  0416 .  oxybutynin (DITROPAN) tablet 5 mg, 5 mg, Oral, BID, Alexis Frock, MD, 5 mg at 07/10/18 2120 .  sertraline (ZOLOFT) tablet 50 mg, 50 mg, Oral, Daily, Simaan, Elizabeth S, PA-C, 50 mg at 07/10/18 1219 .  traMADol (ULTRAM) tablet 50 mg, 50 mg, Oral, Q6H PRN, Jill Alexanders, PA-C, 50 mg at 07/10/18 [phone]  Patients Current Diet:     Diet Order                  Diet - low sodium heart healthy         Diet regular Room service appropriate? Yes; Fluid consistency: Thin  Diet effective now               Precautions / Restrictions Precautions Precautions: Fall Precaution Comments: colostomy, suprapubic catheter, large buttock wound Restrictions Weight Bearing Restrictions: No   Has the patient had 2 or more falls or a fall with injury in the past year?No  Prior Activity Level Limited Community (1-2x/wk): walks Materials engineer / Equipment Home Assistive Devices/Equipment: Environmental consultant (specify type), Wheelchair, Radio producer (specify quad or straight), Eyeglasses, Dentures (specify type), CPAP, Grab bars in shower, Raised toilet seat with rails  Prior Device Use: Indicate devices/aids used by the patient prior to current illness, exacerbation or injury? cane as well as use of R orthotic boot for RLE foot stability  Prior Functional Level Prior Function Level of Independence: Independent  Self Care: Did the patient need help bathing, dressing, using the toilet or eating?  Independent  Indoor Mobility: Did the patient need assistance with walking from room to room (with or without device)? Independent  Stairs: Did the patient need assistance with internal or external stairs (with or without device)? Independent  Functional Cognition: Did the patient need help planning regular tasks such as shopping or remembering to take medications? Independent  Current Functional Level Cognition  Overall Cognitive Status: Within Functional Limits for tasks  assessed Orientation Level: Oriented X4    Extremity Assessment (includes Sensation/Coordination)  Upper Extremity Assessment: Generalized weakness  Lower Extremity Assessment: Defer to PT evaluation    ADLs  Overall ADL's : Needs assistance/impaired Eating/Feeding: Set up, Bed level Grooming: Wash/dry face, Set up, Bed level Upper Body Bathing: Minimal assistance, Sitting Lower Body Bathing: Total assistance, +2 for safety/equipment, Sit to/from stand Upper Body Dressing : Minimal assistance, Sitting Lower Body Dressing: Total assistance, +2 for physical assistance, +2 for safety/equipment, Sit to/from stand Toilet Transfer: (N/A, foley and ostomy ) Functional mobility during ADLs: +2 for physical assistance, +2 for safety/equipment, Maximal assistance, Rolling walker    Mobility  Overal bed mobility: Needs Assistance Bed Mobility: Sidelying to Sit Rolling: Mod assist, +2 for physical assistance Sidelying to sit: Max assist, +2 for physical assistance Supine to sit: Mod assist, +2 for physical assistance Sit to supine: Mod assist, +2 for physical assistance Sit to sidelying: Mod assist, Max assist, +2 for physical assistance General bed mobility comments: assist for legs off bed and to lift trunk with cues (attempted to roll to R, but pt unable to tolerate), to supine assist for trunk and legs(vc for technique to maximize body mechanics)    Transfers  Overall transfer level: Needs assistance Equipment used: Rolling walker (2 wheeled) Transfers: Sit to/from Stand Sit to Stand: Max assist, +2 physical assistance, Mod assist General transfer comment: cues for hand placement, assist for lifting x 2 trials with momentum(vc for RW management)    Ambulation / Gait / Stairs / Wheelchair Mobility  Ambulation/Gait Ambulation/Gait assistance: Min assist, +2 physical assistance Gait Distance (Feet): 25 Feet Assistive device: Rolling walker (2 wheeled) Gait Pattern/deviations:  Step-to pattern, Shuffle, Decreased stride length, Wide base of support General Gait Details: Able to ambulate with RW with cues for technique out to hallway.  Had to have chair follow and sat down once in hallway.  Gait velocity interpretation: <1.31 ft/sec, indicative of household ambulator    Posture / Balance Dynamic Sitting Balance Sitting balance - Comments: (pt leaning back, requiring min-mod A to stabilize) Balance Overall balance assessment: Needs assistance Sitting-balance support: Bilateral upper extremity supported, Feet supported Sitting balance-Leahy Scale: Poor Sitting balance - Comments: (pt leaning back, requiring min-mod A to stabilize) Postural control: Posterior lean Standing balance support: Bilateral upper extremity supported Standing balance-Leahy Scale: Poor Standing balance comment: UE support and assist for balance    Special needs/care consideration BiPAP/CPAP: Use of CPAP and inhaler per family report CPM: No Continuous Drip IV: No Dialysis: No        Days: No Life Vest: no Oxygen: No Special Bed: use of air bed due to extensive gluteal wound Trach Size: No Wound Vac (area): Not currently      Skin: 1) pressure injury from 06/27/18: unstageable, full thickness tissue loss in which the base of the ulcer is covered by slough and/or eschar-healing with early partial granulation with pink, red, and yellow appearance; peri wound is intact with erythema; wound is tunneling with unattached edges and scant serosanguineous drainage   2) abdominal incision for LUQ Colostomy 3) suprapubic cath (some drainage), 4) ecchymosis on Bilateral hands/arms 5) bruising and swelling to base of L thumb                    Bowel mgmt: pt has new colostomy Bladder mgmt: external catheter (suprapubic) Diabetic mgmt: No     Previous Home Environment Living Arrangements: Other (Comment)(pt hopeful to inpt rehab but will d/c to daughters home foll) Reinholds:  No  Discharge Living Setting Plans for Discharge Living Setting: Other (Comment)(pt plans to DC to pt's daughter's house (Cindy's)) Type of Home at Discharge: Other (Comment)(triple wide trailor) Discharge Home Layout: One level Discharge Home Access: Stairs to enter Entrance Stairs-Rails: Right Entrance Stairs-Number of Steps: 1 step, 2 steps, 1 step  Discharge Bathroom Shower/Tub: Walk-in shower Discharge Bathroom Toilet: Handicapped height Discharge Bathroom Accessibility: Yes(RW) How Accessible: Accessible via walker Does the patient have any problems obtaining your medications?: No  Social/Family/Support Systems Patient Roles: Other (Comment)(lives with nephew; completed house chores, walked dog) Contact Information: has two daugthers Advertising copywriter and [place]) and son Delfino Lovett) Anticipated Caregiver: Plan to go to Cindy's house due to more accessible home and caregiver support Anticipated Caregiver's Contact Information: [name]: [phone]; Hassan Rowan [phone]), [name] [phone]) Ability/Limitations of Caregiver: pt will need to be +1 for transfers; pt will have 24/7 caregiver support from [place] and Cindy's husband Caregiver Availability: 24/7 Discharge Plan Discussed with Primary Caregiver: Yes Is Caregiver In Agreement with Plan?: Yes Does Caregiver/Family have Issues with Lodging/Transportation while Pt is in Rehab?: No   Goals/Additional Needs Patient/Family Goal for Rehab: PT: Min A; OT: Min/Mod A; SLP: NA Expected length of stay: 14-17 days Cultural Considerations: [employer] Dietary Needs: Heart diet; thin liquids Equipment Needs: TBD Special Service Needs: colostomy training, wound care: hydrotherapy Additional Information: family would benefit from transfer training  Pt/Family Agrees to Admission and willing to participate: Yes(pt and two daughters) Program Orientation Provided & Reviewed with Pt/Caregiver Including Roles  & Responsibilities: Yes(pt and two  daughters)  Barriers to Discharge: Medical stability, Home environment access/layout, Incontinence, Wound Care  Barriers to Discharge Comments: steps to enter home; large wound requiring hydroptherapy. new colostomy and suprapubic catheter   Decrease burden of Care through IP rehab admission: Decrease caregiver assist to +1 to allow pt to return home with daughter, care  for new colostomy and suprapubic catheter, manage wound care.    Possible need for SNF placement upon discharge: Not anticipated; Family does not want pt to be placed in SNF and plan to take her home with them once able.    Patient Condition: This patient's medical and functional status has changed since the consult dated: 07/04/18 in which the Rehabilitation Physician determined and documented that the patient's condition is appropriate for intensive rehabilitative care in an inpatient rehabilitation facility. See "History of Present Illness" (above) for medical update. Functional changes are: Mod/Max A x2 transfers with RW and Min A x2 for ambulation 25 feet. Patient's medical and functional status update has been discussed with the Rehabilitation physician and patient remains appropriate for inpatient rehabilitation. Will admit to inpatient rehab today.  Preadmission Screen Completed By:  Jhonnie Garner, 07/11/2018 9:31 AM ______________________________________________________________________   Discussed status with Dr. Letta Pate on 07/11/18 at 10:37AM and received telephone approval for admission today.  Admission Coordinator:  Jhonnie Garner, time 10:37AM/Date 07/11/18.       Cosigned by: Charlett Blake, MD at 07/11/2018 11:52 AM  Revision History    Date/Time User Provider Type Action  07/11/2018 11:52 AM Kirsteins, Luanna Salk, MD Physician Cosign  07/11/2018 10:44 AM Jhonnie Garner, OT Rehab Admission Coordinator Addend  07/11/2018 10:37 AM Jhonnie Garner, [place] [facility] Admission Coordinator Sign  View Details Report

## 2018-07-11 NOTE — Progress Notes (Signed)
Physical Therapy Wound Treatment Patient Details  Name: [name] MRN: [id number] Date of Birth: 1928/08/31  Today's Date: 07/11/2018 Time: 1478-2956 Time Calculation (min): 65 min  Subjective  Subjective: Pt expressing pain in L wrist - family had questions regarding x-ray that was done last night and results.  Patient and Family Stated Goals: "Get her functioning before she is discharged home" Per daughter Date of Onset: (unsure) Prior Treatments: Surgical debridement 06/27/18 and 06/29/18. On 8/6 pt underwent diverting colostomy and SP catheter placement.  Pain Score: Pt requesting pain medication during treatment and received at end of treatment.  Wound Assessment  Pressure Injury 06/27/18 Unstageable - Full thickness tissue loss in which the base of the ulcer is covered by slough (yellow, tan, gray, green or brown) and/or eschar (tan, brown or black) in the wound bed. Three areas of black eschar surrounded by blan (Active)  Wound Image    07/09/2018 12:30 PM  Dressing Type Gauze (Comment);ABD;Barrier Film (skin prep);Moist to dry;Tape dressing 07/11/2018  2:16 PM  Dressing Clean;Intact 07/11/2018  2:16 PM  Dressing Change Frequency Twice a day 07/11/2018  2:16 PM  State of Healing Early/partial granulation 07/11/2018  2:16 PM  Site / Wound Assessment Pink;Red;Yellow 07/11/2018  2:16 PM  % Wound base Red or Granulating 80% 07/11/2018  2:16 PM  % Wound base Yellow/Fibrinous Exudate 20% 07/11/2018  2:16 PM  % Wound base Black/Eschar 0% 07/11/2018  2:16 PM  % Wound base Other/Granulation Tissue (Comment) 0% 07/11/2018  2:16 PM  Peri-wound Assessment Intact;Erythema (blanchable) 07/11/2018  2:16 PM  Wound Length (cm) 17.2 cm 07/09/2018 10:23 AM  Wound Width (cm) 15.9 cm 07/09/2018 10:23 AM  Wound Depth (cm) 3 cm 07/09/2018 10:23 AM  Wound Surface Area (cm^2) 273.48 cm^2 07/09/2018 10:23 AM  Wound Volume (cm^3) 820.44 cm^3 07/09/2018 10:23 AM  Tunneling (cm) 7.4 cm tunnel in center of wound 07/09/2018  12:30 PM  Undermining (cm) 5.2 cm 9-11:00; 1.6 cm 6-8:00; 1.5 cm 11-12:00 07/09/2018 12:30 PM  Margins Unattached edges (unapproximated) 07/11/2018  2:16 PM  Drainage Amount Scant 07/11/2018  2:16 PM  Drainage Description Serosanguineous 07/11/2018  2:16 PM  Treatment Debridement (Selective);Hydrotherapy (Pulse lavage);Packing (Saline gauze) 07/11/2018  2:16 PM     Incision (Closed) 06/27/18 Buttocks Left (Active)  Dressing Type ABD;Gauze (Comment);Moist to dry;Other (Comment) 07/09/2018 12:19 AM  Dressing Changed 07/09/2018 12:19 AM  Dressing Change Frequency Twice a day 07/09/2018 12:19 AM  Site / Wound Assessment Red;Other (Comment) 07/09/2018 12:19 AM  Margins Unattached edges (unapproximated) 07/09/2018 12:19 AM  Closure None 07/09/2018 12:19 AM  Drainage Amount Moderate 07/09/2018 12:19 AM  Drainage Description Serosanguineous 07/09/2018 12:19 AM  Treatment Other (Comment) 07/09/2018 12:19 AM     Incision (Closed) 06/29/18 Coccyx (Active)  Dressing Type ABD;Gauze (Comment);Moist to dry 07/02/2018  9:00 PM  Dressing Clean;Dry;Intact 07/06/2018  9:30 AM  Dressing Change Frequency Twice a day 07/01/2018  9:45 PM  Site / Wound Assessment Dressing in place / Unable to assess 07/01/2018  9:45 PM  Margins Unattached edges (unapproximated) 07/01/2018  9:45 PM  Drainage Amount Moderate 07/01/2018  9:45 PM  Drainage Description Serosanguineous;Serous 07/01/2018  9:45 PM  Treatment Cleansed 07/01/2018  9:45 PM     Incision (Closed) 07/03/18 Abdomen Other (Comment) (Active)  Site / Wound Assessment Clean;Dry 07/04/2018  1:21 PM  Margins Unattached edges (unapproximated) 07/04/2018  1:21 PM  Drainage Amount None 07/04/2018  1:21 PM     Incision (Closed) 07/03/18 Abdomen Other (Comment) (Active)  Site /  Wound Assessment Clean;Dry 07/04/2018  1:21 PM  Margins Unattached edges (unapproximated) 07/04/2018  1:21 PM  Drainage Amount None 07/04/2018  1:21 PM     Incision - 2 Ports Abdomen 1: Right;Lateral 2: Left;Lateral (Active)    Port 1 Site Assessment Clean;Dry;Pink 07/03/2018 12:39 PM  Port 1 Margins Attached edges (approximated) 07/03/2018 12:39 PM  Port 1 Drainage Amount None 07/03/2018 12:39 PM  Port 1 Dressing Type Liquid skin adhesive 07/03/2018 12:39 PM  Port 1 Dressing Status Clean;Dry;Intact 07/03/2018 12:39 PM  Port 2 Site Assessment Clean;Dry;Pink 07/03/2018 12:39 PM  Port 2 Margins Attached edges (approximated) 07/03/2018 12:39 PM  Port 2 Drainage Amount None 07/03/2018 12:39 PM  Port 2 Dressing Type Liquid skin adhesive 07/03/2018 12:39 PM  Port 2 Dressing Status Clean;Dry;Intact 07/03/2018 12:39 PM   Hydrotherapy Pulsed lavage therapy - wound location: Buttock Pulsed Lavage with Suction (psi): 12 psi Pulsed Lavage with Suction - Normal Saline Used: 1000 mL Pulsed Lavage Tip: Tip with splash shield Selective Debridement Selective Debridement - Location: Buttock Selective Debridement - Tools Used: Forceps;Scissors Selective Debridement - Tissue Removed: Yellow slough   Wound Assessment and Plan  Wound Therapy - Assess/Plan/Recommendations Wound Therapy - Clinical Statement: Wound bed continues to appear improved. Fluid-filled blisters appear larger at base of spine. Pt will benefit from continued hydrotherapy for selective removal of unviable tissue, to decrease bioburden and promote wound bed healing. Wound Therapy - Functional Problem List: Decreased tolerance for OOB mobility/position changes Factors Delaying/Impairing Wound Healing: Immobility;Multiple medical problems;Altered sensation Hydrotherapy Plan: Debridement;Dressing change;Patient/family education;Pulsatile lavage with suction Wound Therapy - Frequency: 6X / week Wound Therapy - Follow Up Recommendations: Other (comment)(CIR) Wound Plan: See above  Wound Therapy Goals- Improve the function of patient's integumentary system by progressing the wound(s) through the phases of wound healing (inflammation - proliferation - remodeling) by: Decrease Necrotic  Tissue to: 0% Decrease Necrotic Tissue - Progress: Progressing toward goal Increase Granulation Tissue to: 100% Increase Granulation Tissue - Progress: Progressing toward goal Goals/treatment plan/discharge plan were made with and agreed upon by patient/family: Yes Time For Goal Achievement: 7 days Wound Therapy - Potential for Goals: Good  Goals will be updated until maximal potential achieved or discharge criteria met.  Discharge criteria: when goals achieved, discharge from hospital, MD decision/surgical intervention, no progress towards goals, refusal/missing three consecutive treatments without notification or medical reason.  GP     Thelma Comp 07/11/2018, 2:19 PM   Rolinda Roan, PT, DPT Acute Rehabilitation Services Pager: [phone]

## 2018-07-11 NOTE — Progress Notes (Addendum)
Physical Therapy Treatment Patient Details Name: [name] MRN: [id number] DOB: 09/16/1928 Today's Date: 07/11/2018    History of Present Illness Pt is an 84 y/o female with PMH significant for HTN, hypothyroidism who presents with 1 week buttock pain, followed by malaise, diarrhea, and weakness. In ED, pt found to be hypotensive with leukocytosis and lactic acidosis. Pt is now s/p 2 I&D of buttock wound and is undergoing hydrotherapy. Plan for lap colostomy 8/6.    PT Comments    Pt admitted with above diagnosis. Pt currently with functional limitations due to the deficits listed below (see PT Problem List). Pt was abl eto ambulate in hallway and take seated rest breaks and walked 3 x.  Fatigues each attempt.  Left in bed due to transfer to rehab today.   Pt will benefit from skilled PT to increase their independence and safety with mobility to allow discharge to the venue listed below.     Follow Up Recommendations  Supervision/Assistance - 24 hour;CIR     Equipment Recommendations  Other (comment)(TBA)    Recommendations for Other Services Rehab consult     Precautions / Restrictions Precautions Precautions: Fall Precaution Comments: colostomy, suprapubic catheter, large buttock wound Restrictions: Daughter has an soft ankle brace for right foot.  Placed on pt.  Weight Bearing Restrictions: No    Mobility  Bed Mobility Overal bed mobility: Needs Assistance Bed Mobility: Sidelying to Sit Rolling: Mod assist;+2 for physical assistance Sidelying to sit: Max assist;+2 for physical assistance Supine to sit: Mod assist;+2 for physical assistance Sit to supine: Mod assist;+2 for physical assistance Sit to sidelying: Mod assist;Max assist;+2 for physical assistance General bed mobility comments: assist for legs off bed and to lift trunk with cues (attempted to roll to R, but pt unable to tolerate), to supine assist for trunk and legs(vc for technique to maximize body  mechanics)  Transfers Overall transfer level: Needs assistance Equipment used: Rolling walker (2 wheeled) Transfers: Sit to/from Stand Sit to Stand: Max assist;+2 physical assistance;Mod assist         General transfer comment: cues for hand placement, assist for lifting x 2 trials with momentum(vc for RW management)  Ambulation/Gait Ambulation/Gait assistance: Min assist;+2 physical assistance Gait Distance (Feet): 45 Feet(25 feet, 10 feet and 10 feet) Assistive device: Rolling walker (2 wheeled) Gait Pattern/deviations: Step-to pattern;Shuffle;Decreased stride length;Wide base of support   Gait velocity interpretation: <1.31 ft/sec, indicative of household ambulator General Gait Details: Able to ambulate with RW with cues for technique out to hallway.  Had to have chair follow and sat down once in hallway. Then walked 2 more time with chair follow .  DOE 3/4 with ambulation. CUes for breathing and for step technique.    Stairs             Wheelchair Mobility    Modified Rankin (Stroke Patients Only)       Balance Overall balance assessment: Needs assistance Sitting-balance support: Bilateral upper extremity supported;Feet supported Sitting balance-Leahy Scale: Poor Sitting balance - Comments: (pt leaning back, requiring min-mod A to stabilize) Postural control: Posterior lean Standing balance support: Bilateral upper extremity supported Standing balance-Leahy Scale: Poor Standing balance comment: UE support and assist for balance                            Cognition Arousal/Alertness: Awake/alert Behavior During Therapy: Flat affect Overall Cognitive Status: Within Functional Limits for tasks assessed  Exercises General Exercises - Lower Extremity Ankle Circles/Pumps: AROM;10 reps;Both;Supine Short Arc Quad: AROM;10 reps;Both;Supine Heel Slides: AROM;AAROM;5 reps;Both;Supine    General  Comments General comments (skin integrity, edema, etc.): daughter in room      Pertinent Vitals/Pain Pain Assessment: Faces Faces Pain Scale: Hurts even more Pain Location: R hip Pain Descriptors / Indicators: Sore;Aching Pain Intervention(s): Limited activity within patient's tolerance;Monitored during session;Repositioned  VSS  Home Living                      Prior Function            PT Goals (current goals can now be found in the care plan section) Acute Rehab PT Goals Patient Stated Goal: None stated during session Progress towards PT goals: Progressing toward goals    Frequency    Min 3X/week      PT Plan Current plan remains appropriate    Co-evaluation              AM-PAC PT "6 Clicks" Daily Activity  Outcome Measure  Difficulty turning over in bed (including adjusting bedclothes, sheets and blankets)?: A Lot Difficulty moving from lying on back to sitting on the side of the bed? : A Lot Difficulty sitting down on and standing up from a chair with arms (e.g., wheelchair, bedside commode, etc,.)?: A Lot Help needed moving to and from a bed to chair (including a wheelchair)?: A Lot Help needed walking in hospital room?: A Lot Help needed climbing 3-5 steps with a railing? : Total 6 Click Score: 11    End of Session Equipment Utilized During Treatment: Gait belt Activity Tolerance: Patient limited by fatigue Patient left: with call bell/phone within reach;with family/visitor present;in bed Nurse Communication: Mobility status PT Visit Diagnosis: Unsteadiness on feet (R26.81);Pain;Muscle weakness (generalized) (M62.81) Pain - Right/Left: Right Pain - part of body: Hip     Time: [id number] PT Time Calculation (min) (ACUTE ONLY): 23 min  Charges:  $Gait Training: 23-37 mins                     Entergy CorporationDawn Leianna Barga,PT Acute Rehabilitation [phone] (pager)    Berline Lopesawn F Roylee Chaffin 07/11/2018, 2:55 PM

## 2018-07-11 NOTE — Progress Notes (Signed)
OT Cancellation Note  Patient Details Name: [name] MRN: [id number] DOB: 09/18/1928   Cancelled Treatment:    Reason Eval/Treat Not Completed: Patient at procedure or test/ unavailable;Other (comment). Attempted x 2 to see pt today. Pt with PT getting hydrotherapy this a.m. and with PT again this afternoon.  Margaretmary EddySpencer, Palak Tercero Griffin Memorial HospitalJeanette 07/11/2018, 2:57 PM

## 2018-07-12 ENCOUNTER — Inpatient Hospital Stay (HOSPITAL_COMMUNITY): Payer: Medicare Other

## 2018-07-12 ENCOUNTER — Inpatient Hospital Stay (HOSPITAL_COMMUNITY): Payer: Medicare Other | Admitting: Occupational Therapy

## 2018-07-12 ENCOUNTER — Inpatient Hospital Stay (HOSPITAL_COMMUNITY): Payer: Medicare Other | Admitting: Physical Therapy

## 2018-07-12 ENCOUNTER — Ambulatory Visit (HOSPITAL_COMMUNITY): Payer: Medicare Other

## 2018-07-12 ENCOUNTER — Other Ambulatory Visit: Payer: Self-pay

## 2018-07-12 DIAGNOSIS — M1812 Unilateral primary osteoarthritis of first carpometacarpal joint, left hand: Secondary | ICD-10-CM

## 2018-07-12 DIAGNOSIS — M7989 Other specified soft tissue disorders: Secondary | ICD-10-CM

## 2018-07-12 LAB — COMPREHENSIVE METABOLIC PANEL
ALK PHOS: 140 U/L — AB (ref 38–126)
ALT: 57 U/L — ABNORMAL HIGH (ref 0–44)
AST: 49 U/L — AB (ref 15–41)
Albumin: 1.6 g/dL — ABNORMAL LOW (ref 3.5–5.0)
Anion gap: 7 (ref 5–15)
BILIRUBIN TOTAL: 0.6 mg/dL (ref 0.3–1.2)
BUN: 26 mg/dL — AB (ref 8–23)
CALCIUM: 7.7 mg/dL — AB (ref 8.9–10.3)
CO2: 29 mmol/L (ref 22–32)
Chloride: 105 mmol/L (ref 98–111)
Creatinine, Ser: 1.16 mg/dL — ABNORMAL HIGH (ref 0.44–1.00)
GFR calc Af Amer: 47 mL/min — ABNORMAL LOW (ref >60)
GFR calc non Af Amer: 41 mL/min — ABNORMAL LOW (ref >60)
GLUCOSE: 107 mg/dL — AB (ref 70–99)
POTASSIUM: 3.6 mmol/L (ref 3.5–5.1)
Sodium: 141 mmol/L (ref 135–145)
TOTAL PROTEIN: 4.9 g/dL — AB (ref 6.5–8.1)

## 2018-07-12 LAB — CBC WITH DIFFERENTIAL/PLATELET
ABS IMMATURE GRANULOCYTES: 0.1 10*3/uL (ref 0.0–0.1)
Basophils Absolute: 0 10*3/uL (ref 0.0–0.1)
Basophils Relative: 1 %
Eosinophils Absolute: 0.2 10*3/uL (ref 0.0–0.7)
Eosinophils Relative: 2 %
HEMATOCRIT: 25.8 % — AB (ref 36.0–46.0)
HEMOGLOBIN: 8 g/dL — AB (ref 12.0–15.0)
IMMATURE GRANULOCYTES: 1 %
LYMPHS PCT: 24 %
Lymphs Abs: 1.9 10*3/uL (ref 0.7–4.0)
MCH: 29.5 pg (ref 26.0–34.0)
MCHC: 31 g/dL (ref 30.0–36.0)
MCV: 95.2 fL (ref 78.0–100.0)
Monocytes Absolute: 1.6 10*3/uL — ABNORMAL HIGH (ref 0.1–1.0)
Monocytes Relative: 19 %
NEUTROS PCT: 53 %
Neutro Abs: 4.4 10*3/uL (ref 1.7–7.7)
Platelets: 169 10*3/uL (ref 150–400)
RBC: 2.71 MIL/uL — AB (ref 3.87–5.11)
RDW: 15.9 % — ABNORMAL HIGH (ref 11.5–15.5)
WBC: 8.1 10*3/uL (ref 4.0–10.5)

## 2018-07-12 MED ORDER — METHYLPREDNISOLONE 4 MG PO TBPK
4.0000 mg | ORAL_TABLET | ORAL | Status: AC
  Administered 2018-07-12: 4 mg via ORAL

## 2018-07-12 MED ORDER — METHYLPREDNISOLONE 4 MG PO TBPK
4.0000 mg | ORAL_TABLET | Freq: Four times a day (QID) | ORAL | Status: AC
  Administered 2018-07-14 – 2018-07-17 (×10): 4 mg via ORAL

## 2018-07-12 MED ORDER — METHYLPREDNISOLONE 4 MG PO TBPK
4.0000 mg | ORAL_TABLET | Freq: Three times a day (TID) | ORAL | Status: AC
  Administered 2018-07-13 (×3): 4 mg via ORAL

## 2018-07-12 MED ORDER — METHYLPREDNISOLONE 4 MG PO TBPK
8.0000 mg | ORAL_TABLET | Freq: Every morning | ORAL | Status: AC
  Administered 2018-07-12: 8 mg via ORAL
  Filled 2018-07-12: qty 21

## 2018-07-12 MED ORDER — ALPRAZOLAM 0.25 MG PO TABS
0.2500 mg | ORAL_TABLET | Freq: Every day | ORAL | Status: DC
  Administered 2018-07-12 – 2018-07-18 (×7): 0.25 mg via ORAL
  Filled 2018-07-12 (×7): qty 1

## 2018-07-12 MED ORDER — METHYLPREDNISOLONE 4 MG PO TBPK
8.0000 mg | ORAL_TABLET | Freq: Every evening | ORAL | Status: AC
  Administered 2018-07-13: 8 mg via ORAL

## 2018-07-12 MED ORDER — METHYLPREDNISOLONE 4 MG PO TBPK
8.0000 mg | ORAL_TABLET | Freq: Every evening | ORAL | Status: AC
  Administered 2018-07-12: 8 mg via ORAL

## 2018-07-12 NOTE — Consult Note (Signed)
WOC Nurse ostomy follow up [facility].  Daughter at bedside The convex, one piece ostomy pouch placed yesterday is holding without any signs of leakage.  I placed an ostomy belt on the patient with the assistance of her daughter and emptied the patient's pouch of 150 ml thickened, liquid brown stool.  Additional ostomy supplies at the bedside.  Air mattress in use.  Patient able to turn self in bed with minimal assistance. Helmut MusterSherry Fenix Ruppe, RN, MSN, CWOCN, CNS-BC, pager [phone]

## 2018-07-12 NOTE — Progress Notes (Signed)
Physical Therapy Wound Treatment Patient Details  Name: [name] MRN: [id number] Date of Birth: June 22, 1928  Today's Date: 07/12/2018 PT Individual Time: 6237-6283 PT Individual Time Calculation (min): 53 min   Subjective  Subjective: Pt pleasant and agreeable to hydrotherapy. Patient and Family Stated Goals: "Get her functioning before she is discharged home" Per daughter Date of Onset: (unsure) Prior Treatments: Surgical debridement 06/27/18 and 06/29/18. On 8/6 pt underwent diverting colostomy and SP catheter placement.  Pain Score: Pt premedicated. Minimal pain reported throughout session.  Wound Assessment  Pressure Injury 06/27/18 Unstageable - Full thickness tissue loss in which the base of the ulcer is covered by slough (yellow, tan, gray, green or brown) and/or eschar (tan, brown or black) in the wound bed. Three areas of black eschar surrounded by blan (Active)  Wound Image    07/09/2018 12:30 PM  Dressing Type ABD;Barrier Film (skin prep);Gauze (Comment);Moist to dry;Paper tape 07/12/2018 11:14 AM  Dressing Clean;Dry;Intact 07/12/2018 11:14 AM  Dressing Change Frequency Daily 07/12/2018 11:14 AM  State of Healing Early/partial granulation 07/12/2018 11:14 AM  Site / Wound Assessment Pink;Red;Yellow 07/12/2018 11:14 AM  % Wound base Red or Granulating 80% 07/12/2018 11:14 AM  % Wound base Yellow/Fibrinous Exudate 20% 07/12/2018 11:14 AM  % Wound base Black/Eschar 0% 07/12/2018 11:14 AM  % Wound base Other/Granulation Tissue (Comment) 0% 07/12/2018 11:14 AM  Peri-wound Assessment Intact;Erythema (blanchable) 07/12/2018 11:14 AM  Wound Length (cm) 17.2 cm 07/09/2018 10:23 AM  Wound Width (cm) 15.9 cm 07/09/2018 10:23 AM  Wound Depth (cm) 3 cm 07/09/2018 10:23 AM  Wound Surface Area (cm^2) 273.48 cm^2 07/09/2018 10:23 AM  Wound Volume (cm^3) 820.44 cm^3 07/09/2018 10:23 AM  Tunneling (cm) 7.4 cm tunnel in center of wound 07/09/2018 12:30 PM  Undermining (cm) 5.2 cm 9-11:00; 1.6 cm 6-8:00; 1.5  cm 11-12:00 07/09/2018 12:30 PM  Margins Unattached edges (unapproximated) 07/12/2018 11:14 AM  Drainage Amount Scant 07/12/2018 11:14 AM  Drainage Description Serosanguineous 07/12/2018 11:14 AM  Treatment Debridement (Selective);Hydrotherapy (Pulse lavage);Packing (Saline gauze) 07/12/2018 11:14 AM   Santyl applied to wound bed prior to applying dressing.     Hydrotherapy Pulsed lavage therapy - wound location: Buttock Pulsed Lavage with Suction (psi): 12 psi Pulsed Lavage with Suction - Normal Saline Used: 1000 mL Pulsed Lavage Tip: Tip with splash shield Selective Debridement Selective Debridement - Location: Buttock Selective Debridement - Tools Used: Forceps;Scissors Selective Debridement - Tissue Removed: Yellow slough   Wound Assessment and Plan  Wound Therapy - Assess/Plan/Recommendations Wound Therapy - Clinical Statement: Wound bed continues to appear improved. Peri-wound area appears more inflamed and red today. Pt will benefit from continued hydrotherapy for selective removal of unviable tissue, to decrease bioburden and promote wound bed healing. Wound Therapy - Functional Problem List: Decreased tolerance for OOB mobility/position changes Factors Delaying/Impairing Wound Healing: Immobility;Multiple medical problems Hydrotherapy Plan: Debridement;Dressing change;Patient/family education;Pulsatile lavage with suction Wound Therapy - Frequency: 6X / week Wound Therapy - Follow Up Recommendations: Home health RN Wound Plan: See above  Wound Therapy Goals- Improve the function of patient's integumentary system by progressing the wound(s) through the phases of wound healing (inflammation - proliferation - remodeling) by: Decrease Necrotic Tissue to: 0% Decrease Necrotic Tissue - Progress: Progressing toward goal Increase Granulation Tissue to: 100% Increase Granulation Tissue - Progress: Progressing toward goal Goals/treatment plan/discharge plan were made with and agreed upon  by patient/family: Yes Time For Goal Achievement: 7 days Wound Therapy - Potential for Goals: Good  Goals will be updated until maximal  potential achieved or discharge criteria met.  Discharge criteria: when goals achieved, discharge from hospital, MD decision/surgical intervention, no progress towards goals, refusal/missing three consecutive treatments without notification or medical reason.  GP     Thelma Comp 07/12/2018, 11:32 AM   Rolinda Roan, PT, DPT Acute Rehabilitation Services Pager: [phone]

## 2018-07-12 NOTE — Plan of Care (Signed)
  Problem: Consults Goal: RH GENERAL PATIENT EDUCATION Description See Patient Education module for education specifics. Outcome: Progressing   Problem: RH BLADDER ELIMINATION Goal: RH STG MANAGE BLADDER WITH EQUIPMENT WITH ASSISTANCE Description STG Manage Bladder With Equipment With total Assistance from caregiver of management of suprapubic cath  Outcome: Progressing Flowsheets (Taken 07/12/2018 1544) STG: Pt will manage bladder with equipment with assistance: 1-Total assistance   Problem: RH SKIN INTEGRITY Goal: RH STG SKIN FREE OF INFECTION/BREAKDOWN Description Max assist  Outcome: Progressing Goal: RH STG MAINTAIN SKIN INTEGRITY WITH ASSISTANCE Description STG Maintain Skin Integrity With min Assistance.  Outcome: Progressing Flowsheets (Taken 07/12/2018 1544) STG: Maintain skin integrity with assistance: 1-Total assistance Goal: RH STG ABLE TO PERFORM INCISION/WOUND CARE W/ASSISTANCE Description STG Able To Perform Incision/Wound Care With total Assistance.  Outcome: Progressing Flowsheets (Taken 07/12/2018 1544) STG: Pt will be able to perform incision/wound care with assistance: 1-Total assistance   Problem: RH SAFETY Goal: RH STG ADHERE TO SAFETY PRECAUTIONS W/ASSISTANCE/DEVICE Description STG Adhere to Safety Precautions With min Assistance/Device.  Outcome: Progressing Flowsheets (Taken 07/12/2018 1544) STG:Pt will adhere to safety precautions with assistance/device: 3-Moderate assistance   Problem: RH PAIN MANAGEMENT Goal: RH STG PAIN MANAGED AT OR BELOW PT'S PAIN GOAL Description 4 or less  Outcome: Progressing

## 2018-07-12 NOTE — Progress Notes (Signed)
Social Work  Social Work Assessment and Plan  Patient Details  Name: [name] MRN: [id number] Date of Birth: 12/19/1927  Today's Date: 07/12/2018  Problem List:  Patient Active Problem List   Diagnosis Date Noted  . Sepsis (HCC) 07/11/2018  . Debility   . OSA (obstructive sleep apnea)   . Diastolic dysfunction   . AKI (acute kidney injury) (HCC)   . Post-operative pain   . Tachypnea   . Steroid-induced hyperglycemia   . Leukocytosis   . Pressure injury of skin 06/27/2018  . Cellulitis and abscess of buttock   . Gastrointestinal hemorrhage   . Hypotension 06/26/2018  . ARF (acute renal failure) (HCC) 06/26/2018  . Black stool 06/26/2018  . Anemia 06/26/2018  . Bacteremia 03/07/2016  . Atypical chest pain 03/07/2016  . Acute-on-chronic kidney injury (HCC) 03/07/2016  . Essential hypertension 03/07/2016  . Chronic diastolic congestive heart failure (HCC) 03/07/2016  . Hypothyroidism 03/07/2016  . Depression with anxiety 03/07/2016  . UTI (lower urinary tract infection)   . Near syncope 08/20/2015  . Morbid obesity (HCC) 08/20/2015  . Pain in the chest 07/06/2015  . Awareness alteration, transient 05/07/2015  . History of stroke 05/07/2015   Past Medical History:  Past Medical History:  Diagnosis Date  . Chest pain   . Depression   . GERD (gastroesophageal reflux disease)   . Hypertension   . Sleep apnea   . Thyroid disease    Past Surgical History:  Past Surgical History:  Procedure Laterality Date  . APPENDECTOMY  1946  . CATARACT EXTRACTION     bilateral  . GALLBLADDER SURGERY    . INCISION AND DRAINAGE ABSCESS N/A 06/29/2018   Procedure: REPEAT INCISION AND DRAINAGE  BUTTOCK ABSCESS;  Surgeon: Harriette Bouillonornett, Thomas, MD;  Location: [facility] OR;  Service: General;  Laterality: N/A;  . INCISION AND DRAINAGE PERIRECTAL ABSCESS Left 06/27/2018   Procedure: IRRIGATION AND DEBRIDEMENT BUTTOCK ABSCESS;  Surgeon: Andria MeuseWhite, Christopher M, MD;  Location: [facility] OR;  Service: General;   Laterality: Left;  . INSERTION OF SUPRAPUBIC CATHETER N/A 07/03/2018   Procedure: INSERTION OF SUPRAPUBIC CATHETER;  Surgeon: Crista ElliotBell, Eugene D III, MD;  [facility];  Service: Urology;  Laterality: N/A;  . KNEE ARTHROPLASTY  2006   left   . KNEE ARTHROPLASTY  2006   right   . LAPAROSCOPIC DIVERTED COLOSTOMY N/A 07/03/2018   Procedure: LAPAROSCOPIC DIVERTED COLOSTOMY;  Surgeon: Axel Filleramirez, Armando, MD;  [facility];  Service: General;  Laterality: N/A;  . SHOULDER ARTHROSCOPY  2009   right  . TOTAL HIP ARTHROPLASTY  2002    left   Social History:  reports that she has quit smoking. She has never used smokeless tobacco. She reports that she does not drink alcohol or use drugs.  Family / Support Systems Marital Status: Widow/Widower Patient Roles: Parent, Other (Comment)(dog walker) Children: [name]-daughter [phone]-cell  Rebecca DroneBrenda Castaneda-daughter [phone]-cell Other Supports: [name]-son [phone]-cell Anticipated Caregiver: All three children-will be going to Rebecca's home which is more accessible Ability/Limitations of Caregiver: Will need to be plus one with care due to [facility] husband can only do this amount of care Caregiver Availability: 24/7 Family Dynamics: Close knit family pt has four children who are still living and very involved with her. She has three that are local and will be assisting her at discharge. Arline AspCindy voiced she would do the same for them so they will do what ever she needs.  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    Abuse/Neglect Abuse/Neglect Assessment Can Be Completed: Yes Physical Abuse:  Denies Verbal Abuse: Denies Sexual Abuse: Denies Exploitation of patient/patient's resources: Denies Self-Neglect: Denies  Emotional Status Pt's affect, behavior adn adjustment status: Pt is motivated to do well and wants to recover from this. She has always been independent and taken care of herself and her dogs. She retired when she was 84 yo and was still active. She has slowed down some due to leg issues but was still managing. Recent Psychosocial Issues: other health issues-OA in her hip was still independent Pyschiatric History: No history deferred depression screen due to adjusting to then new unit and therapies. Do feel she would benefit from seeing neuro-psych while here due to hospital course and medical issues. She relies upon her faith and prays to get through her difficulties.  Substance Abuse History: No issues  Patient / Family Perceptions, Expectations & Goals Pt/Family understanding of illness & functional limitations: Pt and daughter-Rebecca have a good understanding of her condition and treatment going forward, glad to be here on rehab to begin her therapies and allow her wounds to heal before going home. A family member is here 24 hr per day and this provides pt with emotional support.  Premorbid pt/family roles/activities: Mom, grandmother, aunt, retiree, church member, etc Anticipated changes in roles/activities/participation: resume Pt/family expectations/goals: Pt states: " God has been good to me and will get me through this."  Arline AspCindy states: " We will do whatever she needs us to do she would for us and has."  [employer] Resources Community Agencies: None Premorbid Home Care/DME Agencies: Other (Comment)(cane, rw, bsc has at home) Transportation available at discharge: Family members Resource referrals recommended: Neuropsychology, Support group (specify)  Discharge Planning Living Arrangements: Other relatives Support Systems: Children, Other relatives,  Friends/neighbors, Psychologist, clinicalChurch/faith community Type of Residence: Private residence Insurance Resources: Media plannerrivate Insurance (specify)(UHC-Medicare) Financial Resources: Restaurant manager, fast foodocial Security Financial Screen Referred: No Living Expenses: Own Money Management: Patient Does the patient have any problems obtaining your medications?: No Home Management: Self and family members Patient/Family Preliminary Plans: Plan to go to Rebecca's home where it is more accessible and all of her chiildren will assist. Main caregiver will be Arline AspCindy and her husband, so need pt at one person care for them to be able to manage her at home. Family is here daily and will observe and participate in her therapies to leanr as much as they can before she goes home. Social Work Anticipated Follow Up Needs: HH/OP, Support Group  Clinical Impression Pleasant female who is willing to work hard and endure the pain to get better and mobile again. Her family is very involved and are providing 24 hr care since she has been in a hospital. Aware she will require 24 hr physical care and hope it can be one person assist. Will await therapy evaluations and work on a safe plan for her. Do feel she would benefit from seeing neuro-psych while here, will make referral.  Zaela Graley, Lemar LivingsRebecca G 07/12/2018, 11:35 AM

## 2018-07-12 NOTE — IPOC Note (Signed)
Overall Plan of Care [facility](IPOC) Patient Details Name: Rebecca KayGracie B Castaneda MRN: [id number] DOB: 08/21/1928  Admitting Diagnosis: <principal problem not specified>  Hospital Problems: Active Problems:   Sepsis [facility](HCC)     Functional Problem List: Nursing Bladder, Bowel, Edema, Endurance, Nutrition, Pain, Skin Integrity  PT Balance, Skin Integrity, Edema, Endurance, Motor, Pain, Safety  OT Balance  SLP    TR         Basic ADL's: OT Grooming, Bathing, Dressing     Advanced  ADL's: OT Simple Meal Preparation     Transfers: PT Bed Mobility, Bed to Chair, Car, Furniture  OT       Locomotion: PT Ambulation, Stairs     Additional Impairments: OT None  SLP        TR      Anticipated Outcomes Item Anticipated Outcome  Self Feeding Mod I  Swallowing      Basic self-care  Supervision  Toileting  Supervision   Bathroom Transfers n/a will not transfer to toilet 2/2 ostomy and cathater. Not medically appropriate to shower  Bowel/Bladder  Manage colostmy and suprapubic cath with total assistance from caregiver  Transfers  minA  Locomotion  S with LRAD, minA stairs for home entry  Communication     Cognition     Pain  4 or less  Safety/Judgment  min assist   Therapy Plan: PT Intensity: Minimum of 1-2 x/day ,45 to 90 minutes PT Frequency: 5 out of 7 days PT Duration Estimated Length of Stay: 14-16 days OT Intensity: Minimum of 1-2 x/day, 45 to 90 minutes OT Frequency: 5 out of 7 days OT Duration/Estimated Length of Stay: 12-14 days      Team Interventions: Nursing Interventions Patient/Family Education, Disease Management/Prevention, Skin Care/Wound Management, Bladder Management, Pain Management, Bowel Management, Medication Management, Psychosocial Support  PT interventions Ambulation/gait training, Discharge planning, Functional mobility training, Psychosocial support, Therapeutic Activities, Therapeutic Exercise, Skin care/wound management, Neuromuscular  re-education, Disease management/prevention, Balance/vestibular training, Pain management, DME/adaptive equipment instruction, Splinting/orthotics, UE/LE Strength taining/ROM, UE/LE Coordination activities, Stair training, FirefighterCommunity reintegration, Equities traderatient/family education  OT Interventions Discharge planning, Warden/rangerBalance/vestibular training, Pain management, Self Care/advanced ADL retraining, Therapeutic Activities, UE/LE Coordination activities, Therapeutic Exercise, Patient/family education, Functional mobility training, UE/LE Strength taining/ROM, Wheelchair propulsion/positioning, DME/adaptive equipment instruction, Psychosocial support, Skin care/wound managment  SLP Interventions    TR Interventions    SW/CM Interventions Discharge Planning, Psychosocial Support, Patient/Family Education   Barriers to Discharge MD  Medical stability and new colostomy  Nursing Wound Care    PT Wound Care, Inaccessible home environment    OT Wound Care    SLP      SW       Team Discharge Planning: Destination: PT-Home ,OT- Home , SLP-  Projected Follow-up: PT-Home health PT, 24 hour supervision/assistance, OT-  Home health OT, SLP-  Projected Equipment Needs: PT-To be determined, OT- To be determined, SLP-  Equipment Details: PT-has SPC, OT-  Patient/family involved in discharge planning: PT- Patient, Family member/caregiver,  OT-Family member/caregiver, Patient, SLP-   MD ELOS: 14-17d Medical Rehab Prognosis:  Good Assessment:  84 year old right-handed female with history of hypo-thyroidism, OSA on CPAP, morbid obesity. Per chart review [place]. Used a cane prior to admission due to end-stage osteoarthritis of the hip. She has multiple family members with good support. Presented 06/26/2018 with diarrhea with black tarry stool and abdominal pain as well as severe hypotension. Patient noted poor appetite for the past few days. WBC 16,100, creatinine 5.6,  hemoglobin 10.7, fecal occult blood positive, troponin negative, lactic acid 2.96. Echocardiogram with ejection fraction of 55% grade 2 diastolic dysfunction. CT of abdomen and pelvis showed focal hazy inflammatory stranding adjacent to a loop of small bowel within the left abdomen suspicious for acute enteritis. No associated obstruction. Sigmoid diverticulosis without evidence for acute diverticulitis. 7 mm nodule left lung base increased in prominence as compared to 2012.WOCfollow-up for 3 dark and necrotic areas present on the left buttocks with surrounding erythema consistent with cellulitis extending all the way to her left labia. Underwent excisional debridement of left gluteal necrotizing soft tissue infection 06/27/2018 per Dr. Cliffton AstersWhite followed by debridement of large sacral perineal and sacral wound 06/29/2018 per Dr. Luisa Hartornett. Urology consulted 07/01/2018 due to ongoing urinary incontinence which slowly progressed and later underwent suprapubic tube placement 07/03/2018 per Dr. Alvester MorinBell as well as laparoscopic diverting loop colostomy 07/03/2018 per Dr. Derrell Lollingamirez. There were some issues of urine leaking around suprapubic tube with urology to follow-up on question need to change suprapubic. Nephrology service is consulted in regards to severe AKI felt to be related to hypotension responding to IV fluids. No current plan for diagnostic GI work-up noted. Hospital course pain management. Urine culture 06/27/2018 showed Enterobacter sensitive to Zosyn later changed to Augmentin x10-day courseand completed.. Receiving hydrotherapy for significant sacral wounds. Subcutaneous Lovenox for DVT prophylaxis. Physical and occupational therapy evaluations completed with recommendations of physical medicine rehab consult. Patient was admitted for a comprehensive rehab program.   Now requiring 24/7 Rehab RN,MD, as well as CIR level PT, OT .  Treatment team will focus on ADLs and mobility [facility]  [facility]/Supervision  See Team Conference Notes for weekly updates to the plan of care

## 2018-07-12 NOTE — Progress Notes (Signed)
Subjective/Complaints:   Objective: Vital Signs: Blood pressure (!) 110/46, pulse 68, temperature 98.6 F (37 C), temperature source Oral, resp. rate 16, height 5' 4"  (1.626 m), weight 102.9 kg, SpO2 96 %. No results found. Results for orders placed or performed during the hospital encounter of 07/11/18 (from the past 72 hour(s))  CBC WITH DIFFERENTIAL     Status: Abnormal   Collection Time: 07/12/18  4:47 AM  Result Value Ref Range   WBC 8.1 4.0 - 10.5 K/uL   RBC 2.71 (L) 3.87 - 5.11 MIL/uL   Hemoglobin 8.0 (L) 12.0 - 15.0 g/dL   HCT 25.8 (L) 36.0 - 46.0 %   MCV 95.2 78.0 - 100.0 fL   MCH 29.5 26.0 - 34.0 pg   MCHC 31.0 30.0 - 36.0 g/dL   RDW 15.9 (H) 11.5 - 15.5 %   Platelets 169 150 - 400 K/uL   Neutrophils Relative % 53 %   Neutro Abs 4.4 1.7 - 7.7 K/uL   Lymphocytes Relative 24 %   Lymphs Abs 1.9 0.7 - 4.0 K/uL   Monocytes Relative 19 %   Monocytes Absolute 1.6 (H) 0.1 - 1.0 K/uL   Eosinophils Relative 2 %   Eosinophils Absolute 0.2 0.0 - 0.7 K/uL   Basophils Relative 1 %   Basophils Absolute 0.0 0.0 - 0.1 K/uL   Immature Granulocytes 1 %   Abs Immature Granulocytes 0.1 0.0 - 0.1 K/uL    Comment: Performed at [facility], [address] [place], [place] [address]  Comprehensive metabolic panel     Status: Abnormal   Collection Time: 07/12/18  4:47 AM  Result Value Ref Range   Sodium 141 135 - 145 mmol/L   Potassium 3.6 3.5 - 5.1 mmol/L   Chloride 105 98 - 111 mmol/L   CO2 29 22 - 32 mmol/L   Glucose, Bld 107 (H) 70 - 99 mg/dL   BUN 26 (H) 8 - 23 mg/dL   Creatinine, Ser 1.16 (H) 0.44 - 1.00 mg/dL   Calcium 7.7 (L) 8.9 - 10.3 mg/dL   Total Protein 4.9 (L) 6.5 - 8.1 g/dL   Albumin 1.6 (L) 3.5 - 5.0 g/dL   AST 49 (H) 15 - 41 U/L   ALT 57 (H) 0 - 44 U/L   Alkaline Phosphatase 140 (H) 38 - 126 U/L   Total Bilirubin 0.6 0.3 - 1.2 mg/dL   GFR calc non Af Amer 41 (L) >60 mL/min   GFR calc Af Amer 47 (L) >60 mL/min    Comment: (NOTE) The eGFR has been  calculated using the CKD EPI equation. This calculation has not been validated in all clinical situations. eGFR's persistently <60 mL/min signify possible Chronic Kidney Disease.    Anion gap 7 5 - 15    Comment: Performed at [facility] [address], [place], [place] [address]     HEENT: normal Cardio: RRR and no murmur Resp: unlabored GI: BS positive and NT, ND Extremity:  No Edema Skin:   Wound large stage 4 left sacral with W->Dry dressing Neuro: Alert/Oriented and Abnormal Motor 4/5 BUE, 3- BLE Musc/Skel:  Other Left thumb MCP swelling and tenderness Gen NAD   Assessment/Plan: 1. Functional deficits secondary to Debilitysecondary to sepsis fromFourniergangrene. Status post debridement of left gluteal necrotizing soft tissue infection 06/27/2018 followed by debridement of large sacral perineal wound 06/29/2018, status post diverting colostomy placement and status post suprapubic placement 07/03/2018 which require 3+ hours per day of interdisciplinary  therapy in a comprehensive inpatient rehab setting. Physiatrist is providing close team supervision and 24 hour management of active medical problems listed below. Physiatrist and rehab team continue to assess barriers to discharge/monitor patient progress toward functional and medical goals. FIM:                   Function - Comprehension Comprehension: Auditory Comprehension assist level: Follows basic conversation/direction with no assist  Function - Expression Expression: Verbal Expression assist level: Expresses basic needs/ideas: With no assist  Function - Social Interaction Social Interaction assist level: Interacts appropriately with others with medication or extra time (anti-anxiety, antidepressant).  Function - Problem Solving Problem solving assist level: Solves basic problems with no assist  Function - Memory Memory assist level: Recognizes or recalls 90% of the time/requires cueing < 10% of  the time Patient normally able to recall (first 3 days only): Current season, That he or she is in a hospital  Medical Problem List and Plan: 1.Debilitysecondary to sepsis fromFourniergangrene. Status post debridement of left gluteal necrotizing soft tissue infection 06/27/2018 followed by debridement of large sacral perineal wound 06/29/2018, status post diverting colostomy placement and status post suprapubic placement 07/03/2018. Initiate rehab 2. DVT Prophylaxis/Anticoagulation: Subcutaneous Lovenox. Check vascular study 3. Pain Management:Ultram as needed 4. Mood:Zoloft 50 mg daily, also on chronic low dose xanax at night, per daughter not sleeping well will resume5. Neuropsych: This patientiscapable of making decisions on herown behalf. 6. Skin/Wound Care:Skin care as directed 7. Fluids/Electrolytes/Nutrition:Routine in and outs with follow-up chemistries 8.ID. Augmentin completed 9.Acute on chronic grade 2 diastolic dysfunction. Monitor for any signs of fluid overload. Continue Lasix I 181m yesterday 10.AKI. Resolving. Felt to be related to hypotension. Follow-up chemistries stable 11.Hypothyroidism. Synthroid 12.Morbid obesity. BMI 40.58. Dietary follow-up 13.OSA-CPAP 14.  ABLA Hgb 8.0, stable 15.  Hx gout Left thumb MCP with flare up will start prednisone, hold allopurinol for now LOS (Days) 1 A FBannerE Derrico Zhong 07/12/2018, 7:17 AM

## 2018-07-12 NOTE — Evaluation (Signed)
Occupational Therapy Assessment and Plan  Patient Details  Name: [name] MRN: [id number] Date of Birth: Jul 22, 1928  OT Diagnosis: acute pain and muscle weakness (generalized) Rehab Potential: Rehab Potential (ACUTE ONLY): Good ELOS: 12-14 days   Today's Date: 07/12/2018 OT Individual Time: 1400-1500 OT Individual Time Calculation (min): 60 min     Problem List:  Patient Active Problem List   Diagnosis Date Noted  . Sepsis ([place]) 07/11/2018  . Debility   . OSA (obstructive sleep apnea)   . Diastolic dysfunction   . AKI (acute kidney injury) ([place])   . Post-operative pain   . Tachypnea   . Steroid-induced hyperglycemia   . Leukocytosis   . Pressure injury of skin 06/27/2018  . Cellulitis and abscess of buttock   . Gastrointestinal hemorrhage   . Hypotension 06/26/2018  . ARF (acute renal failure) ([place]) 06/26/2018  . Black stool 06/26/2018  . Anemia 06/26/2018  . Bacteremia 03/07/2016  . Atypical chest pain 03/07/2016  . Acute-on-chronic kidney injury ([place]) 03/07/2016  . Essential hypertension 03/07/2016  . Chronic diastolic congestive heart failure ([place]) 03/07/2016  . Hypothyroidism 03/07/2016  . Depression with anxiety 03/07/2016  . UTI (lower urinary tract infection)   . Near syncope 08/20/2015  . Morbid obesity ([place]) 08/20/2015  . Pain in the chest 07/06/2015  . Awareness alteration, transient 05/07/2015  . History of stroke 05/07/2015    Past Medical History:  Past Medical History:  Diagnosis Date  . Chest pain   . Depression   . GERD (gastroesophageal reflux disease)   . Hypertension   . Sleep apnea   . Thyroid disease    Past Surgical History:  Past Surgical History:  Procedure Laterality Date  . APPENDECTOMY  1946  . CATARACT EXTRACTION     bilateral  . GALLBLADDER SURGERY    . INCISION AND DRAINAGE ABSCESS N/A 06/29/2018   Procedure: REPEAT INCISION AND DRAINAGE  BUTTOCK ABSCESS;  Surgeon: Erroll Luna, MD;  Location: [facility];  Service:  General;  Laterality: N/A;  . INCISION AND DRAINAGE PERIRECTAL ABSCESS Left 06/27/2018   Procedure: IRRIGATION AND DEBRIDEMENT BUTTOCK ABSCESS;  Surgeon: Ileana Roup, MD;  Location: [facility];  Service: General;  Laterality: Left;  . INSERTION OF SUPRAPUBIC CATHETER N/A 07/03/2018   Procedure: INSERTION OF SUPRAPUBIC CATHETER;  Surgeon: Lucas Mallow, MD;  Location: [facility];  Service: Urology;  Laterality: N/A;  . KNEE ARTHROPLASTY  2006   left   . KNEE ARTHROPLASTY  2006   right   . LAPAROSCOPIC DIVERTED COLOSTOMY N/A 07/03/2018   Procedure: LAPAROSCOPIC DIVERTED COLOSTOMY;  Surgeon: Ralene Ok, MD;  Location: [facility];  Service: General;  Laterality: N/A;  . SHOULDER ARTHROSCOPY  2009   right  . TOTAL HIP ARTHROPLASTY  2002    left    Assessment & Plan Clinical Impression: [name] is an 84 year old right-handed female with history of hypo-thyroidism, OSA on CPAP, morbid obesity. Per chart review patient lives with her grandson in [place]. Used a cane prior to admission due to end-stage osteoarthritis of the hip. She has multiple family members with good support. Presented 06/26/2018 with diarrhea with black tarry stool and abdominal pain as well as severe hypotension. Patient noted poor appetite for the past few days. WBC 16,100, creatinine 5.6, hemoglobin 10.7, fecal occult blood positive, troponin negative, lactic acid 2.96. Echocardiogram with ejection fraction of 31% grade 2 diastolic dysfunction. CT of abdomen and pelvis showed focal hazy inflammatory stranding adjacent to a loop  of small bowel within the left abdomen suspicious for acute enteritis. No associated obstruction. Sigmoid diverticulosis without evidence for acute diverticulitis. 7 mm nodule left lung base increased in prominence as compared to 2012.WOCfollow-up for 3 dark and necrotic areas present on the left buttocks with surrounding erythema consistent with cellulitis extending all the way to her  left labia. Underwent excisional debridement of left gluteal necrotizing soft tissue infection 06/27/2018 per Dr. Dema Severin followed by debridement of large sacral perineal and sacral wound 06/29/2018 per Dr. Brantley Stage. Urology consulted 07/01/2018 due to ongoing urinary incontinence which slowly progressed and later underwent suprapubic tube placement 07/03/2018 per Dr. Gloriann Loan as well as laparoscopic diverting loop colostomy 07/03/2018 per Dr. Rosendo Gros. There were some issues of urine leaking around suprapubic tube with urology to follow-up on question need to change suprapubic. Nephrology service is consulted in regards to severe AKI felt to be related to hypotension responding to IV fluids. No current plan for diagnostic GI work-up noted. Hospital course pain management. Urine culture 06/27/2018 showed Enterobacter sensitive to Zosyn later changed to Augmentin x10-day courseand completed.. Receiving hydrotherapy for significant sacral wounds. Subcutaneous Lovenox for DVT prophylaxis. Physical and occupational therapy evaluations completed with recommendations of physical medicine rehab consult. Patient was admitted for a comprehensive rehab program. Patient transferred to CIR on 07/11/2018 .    Patient currently requires mod with basic self-care skills secondary to muscle weakness, decreased cardiorespiratoy endurance and decreased standing balance and decreased postural control.  Prior to hospitalization, patient could complete ADLs/IADLs with modified independent .  Patient will benefit from skilled intervention to decrease level of assist with basic self-care skills, increase independence with basic self-care skills and increase level of independence with iADL prior to discharge home with care partner.  Anticipate patient will require 24 hour supervision and follow up home health.  OT - End of Session Activity Tolerance: Tolerates 10 - 20 min activity with multiple rests Endurance Deficit: Yes Endurance  Deficit Description: SOB with short duration activity OT Assessment Rehab Potential (ACUTE ONLY): Good OT Barriers to Discharge: Wound Care OT Patient demonstrates impairments in the following area(s): Balance OT Basic ADL's Functional Problem(s): Grooming;Bathing;Dressing OT Advanced ADL's Functional Problem(s): Simple Meal Preparation OT Additional Impairment(s): None OT Plan OT Intensity: Minimum of 1-2 x/day, 45 to 90 minutes OT Frequency: 5 out of 7 days OT Duration/Estimated Length of Stay: 12-14 days OT Treatment/Interventions: Discharge planning;Balance/vestibular training;Pain management;Self Care/advanced ADL retraining;Therapeutic Activities;UE/LE Coordination activities;Therapeutic Exercise;Patient/family education;Functional mobility training;UE/LE Strength taining/ROM;Wheelchair propulsion/positioning;DME/adaptive equipment instruction;Psychosocial support;Skin care/wound managment OT Self Feeding Anticipated Outcome(s): Mod I OT Basic Self-Care Anticipated Outcome(s): Supervision OT Toileting Anticipated Outcome(s): Supervision OT Bathroom Transfers Anticipated Outcome(s): n/a will not transfer to toilet 2/2 ostomy and cathater. Not medically appropriate to shower OT Recommendation Recommendations for Other Services: Therapeutic Recreation consult Therapeutic Recreation Interventions: Stress management Patient destination: Home Follow Up Recommendations: Home health OT Equipment Recommended: To be determined   Skilled Therapeutic Intervention Pt seen for OT eval and ADL bathing/dressing session. Pt in side-lying upon arrival with daughter present, pt agreeable to tx session. She transferred to EOB with +2 to assist with adjusting hips towards EOB. Required max A to stand from EOB to RW. Ambulated short distant to w/c with RW and min A. Completed bathing/dressing from w/c level, pt able to reach towards feet to wash and don B shoes.  Following seated rest break, pt ambulated  into bathroom and completed toilet transfer to elevated BSC over toilet, max A to stand from surface. Pt returned  to bed at end of session for dressing change to be completed. +2 to be positioned in side-lying for comfort. Left with all needs in reach and family present. Throughout session, education completed regarding role of OT, POC, pt's goals, OT realm of ostomy management and care, DME, and d/c planning.   OT Evaluation Precautions/Restrictions  Precautions Precautions: Fall Precaution Comments: colostomy, suprapubic catheter, large buttock wound Required Braces or Orthoses: Other Brace/Splint Other Brace/Splint: R ankle brace, shoes when OOB per family request Restrictions Weight Bearing Restrictions: No General Chart Reviewed: Yes Pain   No/denies pain Home Living/Prior Functioning Home Living Living Arrangements: Other relatives Available Help at Discharge: Family, Available 24 hours/day Type of Home: House Home Access: Stairs to enter [employer] of Steps: 2 Entrance Stairs-Rails: Right Home Layout: One level, Able to live on main level with bedroom/bathroom Bathroom Shower/Tub: Multimedia programmer: Standard Bathroom Accessibility: Yes  Lives With: Family(Lives with grandson. 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 Lives with grandson. Pt able to complete simple meal prep and household chores)  Able to Take Stairs?: Yes(With family assist) Driving: No Vision Baseline Vision/History: Wears glasses;Cataracts(hx of Cataract surgery) Wears Glasses: At all times Patient Visual Report: No change from baseline Vision Assessment?: No apparent visual deficits Perception  Perception: Within Functional  Limits Praxis Praxis: Intact Cognition Overall Cognitive Status: Within Functional Limits for tasks assessed Arousal/Alertness: Awake/alert Orientation Level: Person;Place;Situation Person: Oriented Place: Oriented Situation: Oriented Year: 2019 Month: August Day of Week: Correct Memory: Appears intact Immediate Memory Recall: Sock;Blue;Bed Memory Recall: Sock;Blue;Bed Memory Recall Sock: Without Cue Memory Recall Blue: Without Cue Memory Recall Bed: Without Cue Awareness: Appears intact Problem Solving: Appears intact Safety/Judgment: Appears intact Sensation Sensation Light Touch: Appears Intact Proprioception: Appears Intact Coordination Gross Motor Movements are Fluid and Coordinated: No Fine Motor Movements are Fluid and Coordinated: Yes Coordination and Movement Description: Generalized weakness and impaired due to pain Motor  Motor Motor: Other (comment) Motor - Skilled Clinical Observations: generalized weakness Trunk/Postural Assessment  Cervical Assessment Cervical Assessment: Exceptions to WFL(Forard head) Thoracic Assessment Thoracic Assessment: Exceptions to WFL(Kyphotic; Rounded shoulders) Lumbar Assessment Lumbar Assessment: Exceptions to WFL(Posterior pelvic tilt) Postural Control Postural Control: Deficits on evaluation(Delayed)  Balance Balance Balance Assessed: Yes Static Sitting Balance Static Sitting - Balance Support: Feet supported;No upper extremity supported Static Sitting - Level of Assistance: 5: Stand by assistance Static Sitting - Comment/# of Minutes: Sitting EOB Dynamic Sitting Balance Dynamic Sitting - Balance Support: Feet supported;No upper extremity supported;During functional activity Dynamic Sitting - Level of Assistance: 5: Stand by assistance;4: Min assist Sitting balance - Comments: Sitting in w/c to complete bathing/dressing tasks Static Standing Balance Static Standing - Balance Support: During functional activity;Right  upper extremity supported;Left upper extremity supported Static Standing - Level of Assistance: 5: Stand by assistance;4: Min assist Static Standing - Comment/# of Minutes: Standing to complete LB clothing management Dynamic Standing Balance Dynamic Standing - Balance Support: During functional activity;Right upper extremity supported;Left upper extremity supported Dynamic Standing - Level of Assistance: 4: Min assist Dynamic Standing - Balance Activities: Lateral lean/weight shifting;Forward lean/weight shifting Dynamic Standing - Comments: Standing to complete LB clothing management Extremity/Trunk Assessment RUE Assessment RUE Assessment: Within Functional Limits General Strength Comments: 4+/5 throughout LUE Assessment LUE Assessment: Within Functional Limits General Strength Comments: 4+/5 throughout  See Function Navigator for Current Functional Status.   Refer to Care Plan for Long Term Goals  Recommendations for other services: Therapeutic Recreation  Stress management   Discharge Criteria: Patient will be discharged from OT if patient refuses treatment 3 consecutive times without medical reason, if treatment goals not met, if there is a change in medical status, if patient makes no progress towards goals or if patient is discharged from hospital.  The above assessment, treatment plan, treatment alternatives and goals were discussed and mutually agreed upon: by patient and by family  Caryn Gienger L 07/12/2018, 3:44 PM

## 2018-07-12 NOTE — Progress Notes (Signed)
Patient information reviewed and entered into eRehab system by Brynlyn Dade, RN, CRRN, PPS Coordinator.  Information including medical coding and functional independence measure will be reviewed and updated through discharge.     Per nursing patient was given "Data Collection Information Summary for Patients in Inpatient Rehabilitation Facilities with attached "Privacy Act Statement-Health Care Records" upon admission.  

## 2018-07-12 NOTE — Progress Notes (Signed)
*  Preliminary Results* Bilateral lower extremity venous duplex completed. Bilateral lower extremities are negative for deep vein thrombosis. There is no evidence of Baker's cyst bilaterally.  07/12/2018 9:12 AM Aundra MilletMegan Clare Gandyiddle

## 2018-07-12 NOTE — Progress Notes (Signed)
Initial Nutrition Assessment  DOCUMENTATION CODES:   Obesity unspecified  INTERVENTION:  Continue Ensure Enlive po BID, each supplement provides 350 kcal and 20 grams of protein  Continue 30 ml Prostat po BID, each supplement provides 100 kcal and 15 grams of protein.   Encourage adequate PO intake.   NUTRITION DIAGNOSIS:   Increased nutrient needs related to wound healing as evidenced by estimated needs.  GOAL:   Patient will meet greater than or equal to 90% of their needs  MONITOR:   PO intake, Supplement acceptance, Labs, Weight trends, I & O's, Skin  REASON FOR ASSESSMENT:   Malnutrition Screening Tool    ASSESSMENT:   84 year old right-handed female with history of hypo-thyroidism, OSA on CPAP, morbid obesity. Presented 06/26/2018 with diarrhea with black tarry stool and abdominal pain as well as severe hypotension. Pt with 3 dark and necrotic areas present on the left buttocks with surrounding erythema consistent with cellulitis. Underwent excisional debridement of left gluteal necrotizing soft tissue infection 06/27/2018.  Underwent debridement of large sacral perineal and sacral wound 06/29/2018. Pt with laparoscopic diverting loop colostomy 07/03/2018.   Pt working with therapy during time of visit. RD unable to obtain most recent nutrition history. Per RN, pt with only took a couple of bites of food at breakfast, however at lunch did consume 75% of meal. Pt currently has Ensure and Prostat ordered and has been consuming them. RD to continue with current orders to aid in caloric and protein needs as well as in wound healing. Labs and medications reviewed.   Unable to complete Nutrition-Focused physical exam at this time.    Diet Order:   Diet Order            Diet regular Room service appropriate? Yes; Fluid consistency: Thin  Diet effective now              EDUCATION NEEDS:   Not appropriate for education at this time  Skin:  Skin Assessment: Skin Integrity  Issues: Skin Integrity Issues:: Unstageable Unstageable: buttocks  Last BM:  8/15 colostomy 350 ml  Height:   Ht Readings from Last 1 Encounters:  07/11/18 5\' 4"  (1.626 m)    Weight:   Wt Readings from Last 1 Encounters:  07/12/18 102.9 kg    Ideal Body Weight:  54.5 kg  BMI:  Body mass index is 38.94 kg/m.  Estimated Nutritional Needs:   Kcal:  1800-1950  Protein:  90-105 grams  Fluid:  >/= 1.8 L/day    Roslyn SmilingStephanie Donyale Falcon, MS, RD, LDN Pager # [phone] After hours/ weekend pager # [phone]

## 2018-07-12 NOTE — Evaluation (Signed)
Physical Therapy Assessment and Plan  Patient Details  Name: [name] MRN: [id number] Date of Birth: [birth year]  PT Diagnosis: Abnormality of gait, Difficulty walking, Edema, Muscle weakness and Pain in sacral wound Rehab Potential: Good ELOS: 14-16 days   Today's Date: 07/12/2018 PT Individual Time: 1120-1205 PT Individual Time Calculation (min): 45 min    Problem List:  Patient Active Problem List   Diagnosis Date Noted  . Sepsis ([place]) 07/11/2018  . Debility   . OSA (obstructive sleep apnea)   . Diastolic dysfunction   . AKI (acute kidney injury) ([place])   . Post-operative pain   . Tachypnea   . Steroid-induced hyperglycemia   . Leukocytosis   . Pressure injury of skin 06/27/2018  . Cellulitis and abscess of buttock   . Gastrointestinal hemorrhage   . Hypotension 06/26/2018  . ARF (acute renal failure) ([place]) 06/26/2018  . Black stool 06/26/2018  . Anemia 06/26/2018  . Bacteremia 03/07/2016  . Atypical chest pain 03/07/2016  . Acute-on-chronic kidney injury ([place]) 03/07/2016  . Essential hypertension 03/07/2016  . Chronic diastolic congestive heart failure ([place]) 03/07/2016  . Hypothyroidism 03/07/2016  . Depression with anxiety 03/07/2016  . UTI (lower urinary tract infection)   . Near syncope 08/20/2015  . Morbid obesity ([place]) 08/20/2015  . Pain in the chest 07/06/2015  . Awareness alteration, transient 05/07/2015  . History of stroke 05/07/2015    Past Medical History:  Past Medical History:  Diagnosis Date  . Chest pain   . Depression   . GERD (gastroesophageal reflux disease)   . Hypertension   . Sleep apnea   . Thyroid disease    Past Surgical History:  Past Surgical History:  Procedure Laterality Date  . APPENDECTOMY  1946  . CATARACT EXTRACTION     bilateral  . GALLBLADDER SURGERY    . INCISION AND DRAINAGE ABSCESS N/A 06/29/2018   Procedure: REPEAT INCISION AND DRAINAGE  BUTTOCK ABSCESS;  Surgeon: Erroll Luna, MD;  Location: [place];   Service: General;  Laterality: N/A;  . INCISION AND DRAINAGE PERIRECTAL ABSCESS Left 06/27/2018   Procedure: IRRIGATION AND DEBRIDEMENT BUTTOCK ABSCESS;  Surgeon: Ileana Roup, MD;  Location: [place];  Service: General;  Laterality: Left;  . INSERTION OF SUPRAPUBIC CATHETER N/A 07/03/2018   Procedure: INSERTION OF SUPRAPUBIC CATHETER;  Surgeon: Lucas Mallow, MD;  Location: [place] [facility];  Service: Urology;  Laterality: N/A;  . KNEE ARTHROPLASTY  2006   left   . KNEE ARTHROPLASTY  2006   right   . LAPAROSCOPIC DIVERTED COLOSTOMY N/A 07/03/2018   Procedure: LAPAROSCOPIC DIVERTED COLOSTOMY;  Surgeon: Ralene Ok, MD;  Location: [place];  Service: General;  Laterality: N/A;  . SHOULDER ARTHROSCOPY  2009   right  . TOTAL HIP ARTHROPLASTY  2002    left    Assessment & Plan Clinical Impression: [name] is an 84 year old right-handed female with history of hypo-thyroidism, OSA on CPAP, morbid obesity. Per chart review patient lives with her grandson in [place]. Used a cane prior to admission due to end-stage osteoarthritis of the hip. She has multiple family members with good support. Presented 06/26/2018 with diarrhea with black tarry stool and abdominal pain as well as severe hypotension. Patient noted poor appetite for the past few days. WBC 16,100, creatinine 5.6, hemoglobin 10.7, fecal occult blood positive, troponin negative, lactic acid 2.96. Echocardiogram with ejection fraction of 24% grade 2 diastolic dysfunction. CT of abdomen and pelvis showed focal hazy inflammatory stranding adjacent to  a loop of small bowel within the left abdomen suspicious for acute enteritis. No associated obstruction. Sigmoid diverticulosis without evidence for acute diverticulitis. 7 mm nodule left lung base increased in prominence as compared to 2012.WOCfollow-up for 3 dark and necrotic areas present on the left buttocks with surrounding erythema consistent with cellulitis extending all the way  to her left labia. Underwent excisional debridement of left gluteal necrotizing soft tissue infection 06/27/2018 per Dr. Dema Severin followed by debridement of large sacral perineal and sacral wound 06/29/2018 per Dr. Brantley Stage. Urology consulted 07/01/2018 due to ongoing urinary incontinence which slowly progressed and later underwent suprapubic tube placement 07/03/2018 per Dr. Gloriann Loan as well as laparoscopic diverting loop colostomy 07/03/2018 per Dr. Rosendo Gros. There were some issues of urine leaking around suprapubic tube with urology to follow-up on question need to change suprapubic. Nephrology service is consulted in regards to severe AKI felt to be related to hypotension responding to IV fluids. No current plan for diagnostic GI work-up noted. Hospital course pain management. Urine culture 06/27/2018 showed Enterobacter sensitive to Zosyn later changed to Augmentin x10-day courseand completed.. Receiving hydrotherapy for significant sacral wounds. Subcutaneous Lovenox for DVT prophylaxis. Physical and occupational therapy evaluations completed with recommendations of physical medicine rehab consult. Patient was admitted for a comprehensive rehab program. Patient transferred to CIR on 07/11/2018 .   Patient currently requires max with mobility secondary to muscle weakness, decreased cardiorespiratoy endurance and decreased standing balance, decreased postural control and decreased balance strategies.  Prior to hospitalization, patient was min with mobility and lived with Family(lives with grandson but d/c plan to daughters house) in a House home.  Home access is 2Stairs to enter.  Patient will benefit from skilled PT intervention to maximize safe functional mobility, minimize fall risk and decrease caregiver burden for planned discharge home with 24 hour assist.  Anticipate patient will benefit from follow up [facility] at discharge.  PT - End of Session Activity Tolerance: Tolerates 10 - 20 min activity with multiple  rests Endurance Deficit: Yes Endurance Deficit Description: SOB with short duration activity PT Assessment Rehab Potential (ACUTE/IP ONLY): Good PT Barriers to Discharge: Wound Care;Inaccessible home environment PT Patient demonstrates impairments in the following area(s): Balance;Skin Integrity;Edema;Endurance;Motor;Pain;Safety PT Transfers Functional Problem(s): Bed Mobility;Bed to Chair;Car;Furniture PT Locomotion Functional Problem(s): Ambulation;Stairs PT Plan PT Intensity: Minimum of 1-2 x/day ,45 to 90 minutes PT Frequency: 5 out of 7 days PT Duration Estimated Length of Stay: 14-16 days PT Treatment/Interventions: Ambulation/gait training;Discharge planning;Functional mobility training;Psychosocial support;Therapeutic Activities;Therapeutic Exercise;Skin care/wound management;Neuromuscular re-education;Disease management/prevention;Balance/vestibular training;Pain management;DME/adaptive equipment instruction;Splinting/orthotics;UE/LE Strength taining/ROM;UE/LE Coordination activities;Stair training;Community reintegration;Patient/family education PT Transfers Anticipated Outcome(s): minA PT Locomotion Anticipated Outcome(s): S with LRAD, minA stairs for home entry PT Recommendation Recommendations for Other Services: Neuropsych consult;Therapeutic Recreation consult Therapeutic Recreation Interventions: Stress management Follow Up Recommendations: Home health PT;24 hour supervision/assistance Patient destination: Home Equipment Recommended: To be determined Equipment Details: has [facility]  Skilled Therapeutic Intervention Pt received sidelying in bed; c/o pain as below at sacral wound and agreeable to treatment. PT initial evaluation completed as below with modA +2 overall for bed mobility, sit <>stand, and min guard for steps in place. Session limited by dressing falling off and RN not immediately available to change, family/therapist unwilling to allow further mobility d/t concerns  about wet dressing and need for immediate change to reduce risk of skin breakdown and protect wound. Missed 15 min of session d/t ultrasound tech arriving for dopplers. Educated pt and family on goals, length of stay to be determined once  all evaluations completed. Remained in bed at end of session; NT alerted to pt position and need to return to sidelying once ultrasound tech completed.   PT Evaluation Precautions/Restrictions Precautions Precautions: Fall Precaution Comments: colostomy, suprapubic catheter, large buttock wound Required Braces or Orthoses: Other Brace/Splint Other Brace/Splint: R ankle brace, shoes when OOB per family request Restrictions Weight Bearing Restrictions: No General Chart Reviewed: Yes Additional Pertinent History: ambulating with SPC at baseline, needed help for sit <>stand PT Amount of Missed Time (min): 15 Minutes PT Missed Treatment Reason: Other (Comment)(dopplers) Response to Previous Treatment: Not applicable Family/Caregiver Present: Yes Vital Signs Pain Pain Assessment Pain Scale: 0-10 Pain Score: 8  Faces Pain Scale: Hurts a little bit Pain Type: Surgical pain Pain Location: Buttocks Pain Orientation: Right;Mid Pain Descriptors / Indicators: Aching Pain Onset: With Activity Patients Stated Pain Goal: 0 Pain Intervention(s): Medication (See eMAR);Repositioned Home Living/Prior Functioning Home Living Living Arrangements: Other relatives Available Help at Discharge: Family;Available 24 hours/day Type of Home: House Home Access: Stairs to enter [employer] of Steps: 2 Entrance Stairs-Rails: Right Home Layout: One level;Able to live on main level with bedroom/bathroom  Lives With: Family(lives with grandson but d/c plan to daughters house) Prior Function Level of Independence: Requires assistive device for independence;Needs assistance with homemaking;Needs assistance with tranfers;Needs assistance with ADLs(needed physical  assist for sit <>stand, then S with SPC)  Able to Take Stairs?: Yes(with family assisting) Driving: No Vision/Perception  Perception Perception: Within Functional Limits Praxis Praxis: Intact  Cognition Overall Cognitive Status: Within Functional Limits for tasks assessed Arousal/Alertness: Awake/alert Memory: Appears intact Awareness: Appears intact Problem Solving: Appears intact Safety/Judgment: Appears intact Sensation Sensation Light Touch: Appears Intact Proprioception: Appears Intact Coordination Gross Motor Movements are Fluid and Coordinated: No Coordination and Movement Description: antalgic, slow movements Heel Shin Test: NT Motor  Motor Motor - Skilled Clinical Observations: generalized weakness  Mobility Bed Mobility Bed Mobility: Sit to Supine;Supine to Sit Supine to Sit: Maximal Assistance - Patient - Patient 25-49% Sit to Supine: 2 Helpers Transfers Transfers: Sit to Stand Sit to Stand: 2 Helpers(modA +2) Transfer (Assistive device): Rolling walker Locomotion  Gait Ambulation: No(pt took 10 steps in place with RW and min guard at bedside; limited on eval d/t dressing issues) Gait Gait: No Stairs / Additional Locomotion Stairs: No Wheelchair Mobility Wheelchair Mobility: No  Trunk/Postural Assessment  Cervical Assessment Cervical Assessment: Exceptions to WFL(Forard head) Thoracic Assessment Thoracic Assessment: Exceptions to WFL(Kyphotic; Rounded shoulders) Lumbar Assessment Lumbar Assessment: Exceptions to WFL(Posterior pelvic tilt) Postural Control Postural Control: Deficits on evaluation(Delayed)  Balance Balance Balance Assessed: Yes Static Sitting Balance Static Sitting - Balance Support: Feet supported;No upper extremity supported Static Sitting - Level of Assistance: 6: Modified independent (Device/Increase time) Dynamic Sitting Balance Dynamic Sitting - Balance Support: Feet supported;No upper extremity supported;During functional  activity Dynamic Sitting - Level of Assistance: 5: Stand by assistance Static Standing Balance Static Standing - Balance Support: Bilateral upper extremity supported;During functional activity Static Standing - Level of Assistance: 5: Stand by assistance Dynamic Standing Balance Dynamic Standing - Balance Support: During functional activity Dynamic Standing - Level of Assistance: 4: Min assist Dynamic Standing - Balance Activities: Lateral lean/weight shifting;Forward lean/weight shifting Extremity Assessment  RUE Assessment RUE Assessment: Within Functional Limits General Strength Comments: 4+/5 throughout LUE Assessment LUE Assessment: Within Functional Limits General Strength Comments: 4+/5 throughout  RLE Assessment RLE Assessment: Exceptions to [facility] Passive Range of Motion (PROM) Comments: grossly WFL for functional tasks General Strength Comments: hip flexion 2/5, knee  flexion/extension 4-/5, ankle dorsiflexion/plantarflexion 4/5(pre-morbid ankle instability per family) LLE Assessment Passive Range of Motion (PROM) Comments: grossly WFL for functional tasks General Strength Comments: hip flexion 2/5, knee flexion/extension 4-/5, ankle dorsiflexion/plantarflexion 4/5   See Function Navigator for Current Functional Status.   Refer to Care Plan for Long Term Goals  Recommendations for other services: Neuropsych and Therapeutic Recreation  Stress management  Discharge Criteria: Patient will be discharged from PT if patient refuses treatment 3 consecutive times without medical reason, if treatment goals not met, if there is a change in medical status, if patient makes no progress towards goals or if patient is discharged from hospital.  The above assessment, treatment plan, treatment alternatives and goals were discussed and mutually agreed upon: by patient and by family  Corliss Skains 07/12/2018, 12:18 PM

## 2018-07-12 NOTE — Care Management Note (Signed)
[facility]patient [facility] Individual Statement of Services  Patient Name:  [name]  Date:  07/12/2018  Welcome to the [facility]patient [facility].  Our goal is to provide you with an individualized program based on your diagnosis and situation, designed to meet your specific needs.  With this comprehensive rehabilitation program, you will be expected to participate in at least 3 hours of rehabilitation therapies Monday-Friday, with modified therapy programming on the weekends.  Your rehabilitation program will include the following services:  Physical Therapy (PT), Occupational Therapy (OT), 24 hour per day rehabilitation nursing, Neuropsychology, Case Management (Social Worker), Rehabilitation Medicine, Nutrition Services and Pharmacy Services  Weekly team conferences will be held on Wednesday to discuss your progress.  Your Social Worker will talk with you frequently to get your input and to update you on team discussions.  Team conferences with you and your family in attendance may also be held.  Expected length of stay: 14-16 days Overall anticipated outcome: supervision-min with bed mobility  Depending on your progress and recovery, your program may change. Your Social Worker will coordinate services and will keep you informed of any changes. Your Social Worker's name and contact numbers are listed  below.  The following services may also be recommended but are not provided by the Inpatient Rehabilitation Center:    Home Health Rehabiltiation Services  Outpatient Rehabilitation Services    Arrangements will be made to provide these services after discharge if needed.  Arrangements include referral to agencies that provide these services.  Your insurance has been verified to be:  [employer]-Medicare Your primary doctor is:  Kirby FunkJohn Griffin  Pertinent information will be shared with your doctor and your insurance company.  Social Worker:  Dossie DerBecky Linsey Hirota, SW [phone] or ([phone])  [phone]  Information discussed with and copy given to patient by: Lucy Chrisupree, Ron Junco G, 07/12/2018, 8:57 AM

## 2018-07-12 NOTE — Progress Notes (Signed)
Physical Therapy Session Note  Patient Details  Name: [name] MRN: [id number] Date of Birth: 05/12/1928  Today's Date: 07/12/2018 PT Individual Time: 1120-1205 PT Individual Time Calculation (min): 45 min   Short Term Goals: Week 1:   see PT eval  Skilled Therapeutic Interventions/Progress Updates:    Focused on functional bed mobility, transfers with RW, initiation of gait training with RW, stair negotiation, fitting of ROHO cushion for pressure relief while up in w/c, and education with pt and family on pressure relief recommendations (sit <> stand with staff at least every 30 min when up in w/c). Pt requires max assist for supine -> sit with verbal cues for sequencing and technique. Min to mod assist +2 for sit <> stands with RW and min assist once up with RW for transfers and during gait with RW up to 30' (required +2 or w/c follow for safety) and verbal cues for deep breathing. Initiated stair negotiation for home entry practice requiring mod assist for up/down 1 step using bilateral rails for support. Family present and supportive during session and educated throughout on importance of allowing pt to attempt mobility first as they are often quick to offer physical assist with mobility because that's how she did it prior to admission.   Therapy Documentation Precautions:  Precautions Precautions: Fall Precaution Comments: colostomy, suprapubic catheter, large buttock wound Required Braces or Orthoses: Other Brace/Splint Other Brace/Splint: R ankle brace, shoes when OOB per family request Restrictions Weight Bearing Restrictions: No  Pain: Pain Assessment Pain Scale: 0-10 Pain Score: 8  Pain Type: Surgical pain Pain Location: Buttocks Pain Orientation: Right;Mid Pain Descriptors / Indicators: Aching Pain Onset: With Activity Patients Stated Pain Goal: 0 Pain Intervention(s): Medication (See eMAR);Repositioned   See Function Navigator for Current Functional  Status.   Therapy/Group: Individual Therapy  Karolee StampsGray, Abdelaziz Westenberger Darrol PokeBrescia  Accalia Rigdon B. Lyndal Alamillo, PT, DPT, CBIS  07/12/2018, 12:14 PM

## 2018-07-13 ENCOUNTER — Inpatient Hospital Stay (HOSPITAL_COMMUNITY): Payer: Medicare Other | Admitting: Physical Therapy

## 2018-07-13 ENCOUNTER — Ambulatory Visit (HOSPITAL_COMMUNITY): Payer: Medicare Other

## 2018-07-13 ENCOUNTER — Inpatient Hospital Stay (HOSPITAL_COMMUNITY): Payer: Medicare Other | Admitting: Occupational Therapy

## 2018-07-13 NOTE — Progress Notes (Signed)
Occupational Therapy Session Note  Patient Details  Name: [name] MRN: [id number] Date of Birth: 09/15/1928  Today's Date: 07/13/2018 OT Individual Time: [id number] OT Individual Time Calculation (min): 60 min    Short Term Goals: Week 1:  OT Short Term Goal 1 (Week 1): Pt will thread B LEs into underwear with supervision using AE PRN OT Short Term Goal 2 (Week 1): Pt will complete clothing management in order to access ostomy with steadying assist standing at toilet OT Short Term Goal 3 (Week 1): Pt will complete two grooming tasks in standing with steadying assist in order to increase functional activity tolerance  Skilled Therapeutic Interventions/Progress Updates:    Pt seen for OT session focusing on ADL re-training, and functional standing balance/endurance. Pt in supine upon arrival with family present, family desiring to get pt to EOB to eat breakfast. Education and demonstration provided regarding mobility techniques for effective and safe transfer methods. She transferred to EOB with min-mod A with tactile/ verbal cuing for sequencing/technique. She donned underwear and showers seated EOB, able to reach to floor to thread underwear and don shoes. She ate breakfast seated EOB. While eating, education provided regarding continuum of care and d/c planning. Pt required mod A to stand from elevated EOB. Pt ambulated throughout room with RW. Completed grooming tasks standing at sink with spervision, resting forearms on sink ledge for support. Pt tolerating ~5-7 minutes in standing before requiring seated rest break. Provided with fresh hospital gown and shampoo shower cap, completed from w/c level. Pt left seated in w/c at end of session with daughter present. Pt's RN made aware of need for buttock dressing change to occur.   Therapy Documentation Precautions:  Precautions Precautions: Fall Precaution Comments: colostomy, suprapubic catheter, large buttock wound Required Braces or  Orthoses: Other Brace/Splint Other Brace/Splint: R ankle brace, shoes when OOB per family request Restrictions Weight Bearing Restrictions: No Pain:   No/denies pain  See Function Navigator for Current Functional Status.   Therapy/Group: Individual Therapy  Madalaine Portier L 07/13/2018, 7:01 AM

## 2018-07-13 NOTE — Progress Notes (Signed)
Physical Therapy Session Note  Patient Details  Name: [name] MRN: [id number] Date of Birth: 10/31/1928  Today's Date: 07/13/2018 PT Individual Time: 1300-1345 PT Individual Time Calculation (min): 45 min   Short Term Goals: Week 1:  PT Short Term Goal 1 (Week 1): Pt will perform bed mobility modA PT Short Term Goal 2 (Week 1): Pt will perform sit <>stand modA +1 PT Short Term Goal 3 (Week 1): Pt will perform gait with LRAD x50' minA PT Short Term Goal 4 (Week 1): Pt will ascend/descent 2 steps modA  Skilled Therapeutic Interventions/Progress Updates: Pt received seated in w/c with daughter present, c/o pain as below and agreeable to treatment. Gait x40' with RW and min guard, slow speed and w/c follow for safety. Stand pivot w/c <>mat table with minA to boost to standing, min guard for pivotal steps to mat. Sit <>stand 2 sets 3 reps with minA, mat slightly elevated over height of w/c. During rest breaks, discussed equipment needs with pt and daughter. Will need w/c with roho cushion; educated pt and daughter in cushion options for pressure relief/redistribution to reduce risk of further breakdown. Pt may need hospital bed with air mattress overlay; daughter willing to rearrange house to allow hospital bed in larger bed room to improve access. Alerted CSW to w/c needs. Gait x45' with RW and min guard. TUG performed 52 sec; educated pt/daughter on results and increased risk for falls. BUE ergometer x4 min total however requires rest breaks every 45 sec-1 min d/t fatigue. Returned to room totalA in w/c; remained seated in w/c, daughter present and all needs in reach.      Therapy Documentation Precautions:  Precautions Precautions: Fall Precaution Comments: colostomy, suprapubic catheter, large buttock wound Required Braces or Orthoses: Other Brace/Splint Other Brace/Splint: R ankle brace, shoes when OOB per family request Restrictions Weight Bearing Restrictions: No General:    Pain: Pain Assessment Pain Scale: 0-10 Pain Score: 2  Pain Type: Acute pain Pain Location: Buttocks Pain Orientation: Right;Medial Pain Descriptors / Indicators: Sore Pain Onset: On-going Pain Intervention(s): Ambulation/increased activity   See Function Navigator for Current Functional Status.   Therapy/Group: Individual Therapy  Harlon Dittylizabeth J Lorynn Moeser 07/13/2018, 2:31 PM

## 2018-07-13 NOTE — Progress Notes (Signed)
Physical Therapy Session Note  Patient Details  Name: [name] MRN: [id number] Date of Birth: 11/10/1928  Today's Date: 07/13/2018   Short Term Goals: Week 1:  PT Short Term Goal 1 (Week 1): Pt will perform bed mobility modA PT Short Term Goal 2 (Week 1): Pt will perform sit <>stand modA +1 PT Short Term Goal 3 (Week 1): Pt will perform gait with LRAD x50' minA PT Short Term Goal 4 (Week 1): Pt will ascend/descent 2 steps modA  Skilled Therapeutic Interventions/Progress Updates: Pt missed 30 min PT d/t dressing change being performed by RNs, IV removal. Will attempt to follow up as scheduling allows.      Therapy Documentation Precautions:  Precautions Precautions: Fall Precaution Comments: colostomy, suprapubic catheter, large buttock wound Required Braces or Orthoses: Other Brace/Splint Other Brace/Splint: R ankle brace, shoes when OOB per family request Restrictions Weight Bearing Restrictions: No General: PT Amount of Missed Time (min): 30 Minutes PT Missed Treatment Reason: Nursing care   See Function Navigator for Current Functional Status.   Therapy/Group: Individual Therapy  [name] 07/13/2018, 9:07 AM

## 2018-07-13 NOTE — Plan of Care (Signed)
  Problem: RH BLADDER ELIMINATION Goal: RH STG MANAGE BLADDER WITH EQUIPMENT WITH ASSISTANCE Description STG Manage Bladder With Equipment With total Assistance from caregiver of management of suprapubic cath  Outcome: Progressing  Suprapubic cath Problem: RH SKIN INTEGRITY Goal: RH STG ABLE TO PERFORM INCISION/WOUND CARE W/ASSISTANCE Description STG Able To Perform Incision/Wound Care With total Assistance.  Outcome: Progressing  Dressing change as order/prn Problem: RH SAFETY Goal: RH STG ADHERE TO SAFETY PRECAUTIONS W/ASSISTANCE/DEVICE Description STG Adhere to Safety Precautions With min Assistance/Device.  Outcome: Progressing  Call light at hand, bed alarm  Problem: RH PAIN MANAGEMENT Goal: RH STG PAIN MANAGED AT OR BELOW PT'S PAIN GOAL Description 4 or less  Outcome: Progressing  Continuing assessing pain

## 2018-07-13 NOTE — Progress Notes (Signed)
Physical Therapy Wound Treatment Patient Details  Name: CASANDRA DALLAIRE MRN: [id number] Date of Birth: 01/24/1928  Today's Date: 07/13/2018 PT Individual Time:  -      Patient Active Problem List   Diagnosis Date Noted  . Sepsis ([place]) 07/11/2018  . Debility   . OSA (obstructive sleep apnea)   . Diastolic dysfunction   . AKI (acute kidney injury) ([place])   . Post-operative pain   . Tachypnea   . Steroid-induced hyperglycemia   . Leukocytosis   . Pressure injury of skin 06/27/2018  . Cellulitis and abscess of buttock   . Gastrointestinal hemorrhage   . Hypotension 06/26/2018  . ARF (acute renal failure) ([place]) 06/26/2018  . Black stool 06/26/2018  . Anemia 06/26/2018  . Bacteremia 03/07/2016  . Atypical chest pain 03/07/2016  . Acute-on-chronic kidney injury ([place]) 03/07/2016  . Essential hypertension 03/07/2016  . Chronic diastolic congestive heart failure ([place]) 03/07/2016  . Hypothyroidism 03/07/2016  . Depression with anxiety 03/07/2016  . UTI (lower urinary tract infection)   . Near syncope 08/20/2015  . Morbid obesity ([place]) 08/20/2015  . Pain in the chest 07/06/2015  . Awareness alteration, transient 05/07/2015  . History of stroke 05/07/2015   Past Medical History:  Diagnosis Date  . Chest pain   . Depression   . GERD (gastroesophageal reflux disease)   . Hypertension   . Sleep apnea   . Thyroid disease    Past Surgical History:  Procedure Laterality Date  . APPENDECTOMY  1946  . CATARACT EXTRACTION     bilateral  . GALLBLADDER SURGERY    . INCISION AND DRAINAGE ABSCESS N/A 06/29/2018   Procedure: REPEAT INCISION AND DRAINAGE  BUTTOCK ABSCESS;  Surgeon: Erroll Luna, MD;  Location: [place];  Service: General;  Laterality: N/A;  . INCISION AND DRAINAGE PERIRECTAL ABSCESS Left 06/27/2018   Procedure: IRRIGATION AND DEBRIDEMENT BUTTOCK ABSCESS;  Surgeon: Ileana Roup, MD;  Location: [place];  Service: General;  Laterality: Left;  . INSERTION OF SUPRAPUBIC  CATHETER N/A 07/03/2018   Procedure: INSERTION OF SUPRAPUBIC CATHETER;  Surgeon: Lucas Mallow, MD;  Location: [place];  Service: Urology;  Laterality: N/A;  . KNEE ARTHROPLASTY  2006   left   . KNEE ARTHROPLASTY  2006   right   . LAPAROSCOPIC DIVERTED COLOSTOMY N/A 07/03/2018   Procedure: LAPAROSCOPIC DIVERTED COLOSTOMY;  Surgeon: Ralene Ok, MD;  Location: [facility];  Service: General;  Laterality: N/A;  . SHOULDER ARTHROSCOPY  2009   right  . TOTAL HIP ARTHROPLASTY  2002    left    Subjective  Subjective: Pt pleasant and agreeable to hydrotherapy. Patient and Family Stated Goals: "Get her functioning before she is discharged home" Per daughter Date of Onset: (unsure) Prior Treatments: Surgical debridement 06/27/18 and 06/29/18. On 8/6 pt underwent diverting colostomy and SP catheter placement.  Pain Score: Pt premedicated  Objective  Cognition: WFL Communication: WFL Mobility: Impaired:  ROM: Impaired:  Sensation: WFL by patient report Protective sensation (10g): intact Signs of venous insufficiency:N/A Signs of arterial insufficiency: N/A  Wound Assessment  Pressure Injury 06/27/18 Unstageable - Full thickness tissue loss in which the base of the ulcer is covered by slough (yellow, tan, gray, green or brown) and/or eschar (tan, brown or black) in the wound bed. Three areas of black eschar surrounded by blan (Active)  Wound Image    07/09/2018 12:30 PM  Dressing Type ABD;Gauze (Comment);Moist to dry;Barrier Film (skin prep);Paper tape 07/13/2018 12:48 PM  Dressing Changed  07/13/2018 12:48 PM  Dressing Change Frequency Daily 07/13/2018 12:48 PM  State of Healing Early/partial granulation 07/13/2018 12:48 PM  Site / Wound Assessment Pink;Yellow;Red 07/13/2018 12:48 PM  % Wound base Red or Granulating 85% 07/13/2018 12:48 PM  % Wound base Yellow/Fibrinous Exudate 15% 07/13/2018 12:48 PM  % Wound base Black/Eschar 0% 07/13/2018 12:48 PM  % Wound base Other/Granulation Tissue (Comment)  0% 07/13/2018 12:48 PM  Peri-wound Assessment Intact;Erythema (blanchable) 07/13/2018 12:48 PM  Wound Length (cm) 17.2 cm 07/09/2018 10:23 AM  Wound Width (cm) 15.9 cm 07/09/2018 10:23 AM  Wound Depth (cm) 3 cm 07/09/2018 10:23 AM  Wound Surface Area (cm^2) 273.48 cm^2 07/09/2018 10:23 AM  Wound Volume (cm^3) 820.44 cm^3 07/09/2018 10:23 AM  Tunneling (cm) 7.4 cm tunnel in center of wound 07/09/2018 12:30 PM  Undermining (cm) 5.2 cm 9-11:00; 1.6 cm 6-8:00; 1.5 cm 11-12:00 07/09/2018 12:30 PM  Margins Unattached edges (unapproximated) 07/13/2018 12:48 PM  Drainage Amount Minimal 07/13/2018 12:48 PM  Drainage Description Serosanguineous 07/13/2018 12:48 PM  Treatment Debridement (Selective);Hydrotherapy (Pulse lavage);Packing (Saline gauze) 07/13/2018 12:48 PM   Santyl applied to wound bed prior to applying dressing.     Hydrotherapy Pulsed lavage therapy - wound location: Buttock Pulsed Lavage with Suction (psi): 12 psi Pulsed Lavage with Suction - Normal Saline Used: 1000 mL Pulsed Lavage Tip: Tip with splash shield Selective Debridement Selective Debridement - Location: Buttock Selective Debridement - Tools Used: Forceps;Scissors Selective Debridement - Tissue Removed: Yellow slough   Wound Assessment and Plan  Wound Therapy - Assess/Plan/Recommendations Wound Therapy - Clinical Statement: Wound bed continues to appear improved. Peri-wound area appears less red and blisters appear to be drying up. Discussed with LPN recommendations for paper tape and use of skin prep with each dressing change. Pt will benefit from continued hydrotherapy for selective removal of unviable tissue, to decrease bioburden and promote wound bed healing. Wound Therapy - Functional Problem List: Decreased tolerance for OOB mobility/position changes Factors Delaying/Impairing Wound Healing: Immobility;Multiple medical problems Hydrotherapy Plan: Debridement;Dressing change;Patient/family education;Pulsatile lavage with  suction Wound Therapy - Frequency: 6X / week Wound Therapy - Follow Up Recommendations: Home health RN Wound Plan: See above  Wound Therapy Goals- Improve the function of patient's integumentary system by progressing the wound(s) through the phases of wound healing (inflammation - proliferation - remodeling) by: Decrease Necrotic Tissue to: 0% Decrease Necrotic Tissue - Progress: Progressing toward goal Increase Granulation Tissue to: 100% Increase Granulation Tissue - Progress: Progressing toward goal Goals/treatment plan/discharge plan were made with and agreed upon by patient/family: Yes Time For Goal Achievement: 7 days Wound Therapy - Potential for Goals: Good  Goals will be updated until maximal potential achieved or discharge criteria met.  Discharge criteria: when goals achieved, discharge from hospital, MD decision/surgical intervention, no progress towards goals, refusal/missing three consecutive treatments without notification or medical reason.  Thelma Comp 07/13/2018, 1:01 PM  Rolinda Roan, PT, DPT Acute Rehabilitation Services Pager: [phone]

## 2018-07-13 NOTE — Progress Notes (Signed)
Subjective/Complaints: Left thumb pain improved, eating and drinking well no IV meds , labs reviewed  ROS neg CP, SOB, N/V/D  Objective: Vital Signs: Blood pressure (!) 121/52, pulse (!) 58, temperature 97.8 F (36.6 C), temperature source Oral, resp. rate 15, height 5' 4"  (1.626 m), weight 102.6 kg, SpO2 96 %. No results found. Results for orders placed or performed during the hospital encounter of 07/11/18 (from the past 72 hour(s))  CBC WITH DIFFERENTIAL     Status: Abnormal   Collection Time: 07/12/18  4:47 AM  Result Value Ref Range   WBC 8.1 4.0 - 10.5 K/uL   RBC 2.71 (L) 3.87 - 5.11 MIL/uL   Hemoglobin 8.0 (L) 12.0 - 15.0 g/dL   HCT 25.8 (L) 36.0 - 46.0 %   MCV 95.2 78.0 - 100.0 fL   MCH 29.5 26.0 - 34.0 pg   MCHC 31.0 30.0 - 36.0 g/dL   RDW 15.9 (H) 11.5 - 15.5 %   Platelets 169 150 - 400 K/uL   Neutrophils Relative % 53 %   Neutro Abs 4.4 1.7 - 7.7 K/uL   Lymphocytes Relative 24 %   Lymphs Abs 1.9 0.7 - 4.0 K/uL   Monocytes Relative 19 %   Monocytes Absolute 1.6 (H) 0.1 - 1.0 K/uL   Eosinophils Relative 2 %   Eosinophils Absolute 0.2 0.0 - 0.7 K/uL   Basophils Relative 1 %   Basophils Absolute 0.0 0.0 - 0.1 K/uL   Immature Granulocytes 1 %   Abs Immature Granulocytes 0.1 0.0 - 0.1 K/uL    Comment: Performed at [facility], [address] [place], [place] [address]  Comprehensive metabolic panel     Status: Abnormal   Collection Time: 07/12/18  4:47 AM  Result Value Ref Range   Sodium 141 135 - 145 mmol/L   Potassium 3.6 3.5 - 5.1 mmol/L   Chloride 105 98 - 111 mmol/L   CO2 29 22 - 32 mmol/L   Glucose, Bld 107 (H) 70 - 99 mg/dL   BUN 26 (H) 8 - 23 mg/dL   Creatinine, Ser 1.16 (H) 0.44 - 1.00 mg/dL   Calcium 7.7 (L) 8.9 - 10.3 mg/dL   Total Protein 4.9 (L) 6.5 - 8.1 g/dL   Albumin 1.6 (L) 3.5 - 5.0 g/dL   AST 49 (H) 15 - 41 U/L   ALT 57 (H) 0 - 44 U/L   Alkaline Phosphatase 140 (H) 38 - 126 U/L   Total Bilirubin 0.6 0.3 - 1.2 mg/dL   GFR calc non  Af Amer 41 (L) >60 mL/min   GFR calc Af Amer 47 (L) >60 mL/min    Comment: (NOTE) The eGFR has been calculated using the CKD EPI equation. This calculation has not been validated in all clinical situations. eGFR's persistently <60 mL/min signify possible Chronic Kidney Disease.    Anion gap 7 5 - 15    Comment: Performed at [facility] [address] [place], [place] [address]     HEENT: normal Cardio: RRR and no murmur Resp: unlabored GI: BS positive and NT, ND Extremity:  No Edema Skin:   Wound large stage 4 left sacral with W->Dry dressing Neuro: Alert/Oriented and Abnormal Motor 4/5 BUE, 3- BLE Musc/Skel:  Other Left thumb MCP swelling and tenderness Gen NAD   Assessment/Plan: 1. Functional deficits secondary to Debilitysecondary to sepsis fromFourniergangrene. Status post debridement of left gluteal necrotizing soft tissue infection 06/27/2018 followed by debridement of large sacral perineal wound  06/29/2018, status post diverting colostomy placement and status post suprapubic placement 07/03/2018 which require 3+ hours per day of interdisciplinary therapy in a comprehensive inpatient rehab setting. Physiatrist is providing close team supervision and 24 hour management of active medical problems listed below. Physiatrist and rehab team continue to assess barriers to discharge/monitor patient progress toward functional and medical goals. FIM: Function - Bathing Position: Wheelchair/chair at sink Body parts bathed by patient: Right arm, Left arm, Chest, Abdomen, Right upper leg, Left upper leg Body parts bathed by helper: Front perineal area, Buttocks, Right lower leg, Left lower leg, Back  Function- Upper Body Dressing/Undressing What is the patient wearing?: Pull over shirt/dress Pull over shirt/dress - Perfomed by patient: Thread/unthread right sleeve, Thread/unthread left sleeve, Put head through opening Pull over shirt/dress - Perfomed by helper: Pull shirt over  trunk Function - Lower Body Dressing/Undressing What is the patient wearing?: Underwear, Shoes Position: Wheelchair/chair at sink Underwear - Performed by helper: Thread/unthread right underwear leg, Thread/unthread left underwear leg, Pull underwear up/down Shoes - Performed by patient: Don/doff right shoe, Don/doff left shoe Shoes - Performed by helper: Fasten right, Fasten left Assist for footwear: Partial/moderate assist Assist for lower body dressing: Touching or steadying assistance (Pt > 75%)  Function - Toileting Toileting activity did not occur: N/A  Function - Air cabin crew transfer assistive device: Elevated toilet seat/BSC over toilet Assist level to toilet: Touching or steadying assistance (Pt > 75%) Assist level from toilet: Maximal assist (Pt 25 - 49%/lift and lower)  Function - Chair/bed transfer Chair/bed transfer method: Stand pivot Chair/bed transfer assist level: Touching or steadying assistance (Pt > 75%) Chair/bed transfer assistive device: Armrests, Walker, Orthosis(R ankle brace)  Function - Locomotion: Wheelchair Assist Level: Dependent (Pt equals 0%)(for time management) Function - Locomotion: Ambulation Assistive device: Walker-rolling Max distance: 30' Assist level: 2 helpers(min assist of 1 with +2 for w/c follow) Assist level: 2 helpers Walk 50 feet with 2 turns activity did not occur: Safety/medical concerns Walk 150 feet activity did not occur: Safety/medical concerns Walk 10 feet on uneven surfaces activity did not occur: Safety/medical concerns  Function - Comprehension Comprehension: Auditory Comprehension assist level: Follows complex conversation/direction with extra time/assistive device  Function - Expression Expression: Verbal Expression assist level: Expresses basic needs/ideas: With no assist  Function - Social Interaction Social Interaction assist level: Interacts appropriately with others with medication or extra time  (anti-anxiety, antidepressant).  Function - Problem Solving Problem solving assist level: Solves basic problems with no assist  Function - Memory Memory assist level: Recognizes or recalls 90% of the time/requires cueing < 10% of the time Patient normally able to recall (first 3 days only): Current season, That he or she is in a hospital  Medical Problem List and Plan: 1.Debilitysecondary to sepsis fromFourniergangrene. Status post debridement of left gluteal necrotizing soft tissue infection 06/27/2018 followed by debridement of large sacral perineal wound 06/29/2018, status post diverting colostomy placement and status post suprapubic placement 07/03/2018. CIR OT, PT, SLP 2. DVT Prophylaxis/Anticoagulation: Subcutaneous Lovenox. Check vascular study 3. Pain Management:Ultram as needed 4. Mood:Zoloft 50 mg daily, also on chronic low dose xanax at night, per daughter not sleeping well will resume5. Neuropsych: This patientiscapable of making decisions on herown behalf. 6. Skin/Wound Care:Skin care as directed 7. Fluids/Electrolytes/Nutrition:Routine in and outs with follow-up chemistries 8.ID. Augmentin completed 9.Acute on chronic grade 2 diastolic dysfunction. Monitor for any signs of fluid overload. Continue Lasix I 127m yesterday 10.AKI. Resolving. Felt to be related to hypotension. Follow-up chemistries  stable 11.Hypothyroidism. Synthroid 12.Morbid obesity. BMI 40.58. Dietary follow-up 13.OSA-CPAP 14.  ABLA Hgb 8.0, stable 15.  Hx gout Left thumb MCP with flare up , improved with medrol dose pack, hold allopurinol for now LOS (Days) 2 A [place] E Rosemary Mossbarger 07/13/2018, 7:26 AM

## 2018-07-13 NOTE — Progress Notes (Signed)
Occupational Therapy Session Note  Patient Details  Name: [name] MRN: [id number] Date of Birth: 05/24/1928  Today's Date: 07/13/2018 OT Individual Time: 1110-1200 OT Individual Time Calculation (min): 50 min  and Today's Date: 07/13/2018 OT Missed Time: 10 Minutes Missed Time Reason: Unavailable (comment)(hydrotherapy finishing up session)   Short Term Goals: Week 1:  OT Short Term Goal 1 (Week 1): Pt will thread B LEs into underwear with supervision using AE PRN OT Short Term Goal 2 (Week 1): Pt will complete clothing management in order to access ostomy with steadying assist standing at toilet OT Short Term Goal 3 (Week 1): Pt will complete two grooming tasks in standing with steadying assist in order to increase functional activity tolerance  Skilled Therapeutic Interventions/Progress Updates:    Pt supine in bed, stated buttocks were "sore" from hydrotherapy but willing to work with OT.  Session focused on ostomy care and functional mobility/safety awareness training with family. Pt transitioned to EOB with min HHA and use of bed features. Min A provided during sit to stand with RW for pt to change gowns, (S) overall. Extensive education and demonstration provided to pt and daughter re ostomy care. Pt was provided with a mirror to observe emptying of bag more closely. Pt practiced opening/closing bags x3 to ensure carryover. Pt provided with recommendations for improving ease, sanitation, and confidence. Pt and her daughter were then edu re stand pivot transfer technique to maximize safety for both and body mechanics. Daughter practiced 2x stand pivot transfer with RW and she was signed off to assist pt in the room. Pt was left sitting up in w/c with lunch present.   Therapy Documentation Precautions:  Precautions Precautions: Fall Precaution Comments: colostomy, suprapubic catheter, large buttock wound Required Braces or Orthoses: Other Brace/Splint Other Brace/Splint: R ankle  brace, shoes when OOB per family request Restrictions Weight Bearing Restrictions: No General: General OT Amount of Missed Time: 10 Minutes PT Missed Treatment Reason: Nursing care  Pain: Pain Assessment Pain Scale: 0-10 Pain Score: 2  Pain Type: Acute pain Pain Location: Buttocks Pain Orientation: Right;Medial Pain Descriptors / Indicators: Sore Pain Frequency: Occasional Pain Onset: On-going Patients Stated Pain Goal: 0 Pain Intervention(s): Ambulation/increased activity  See Function Navigator for Current Functional Status.   Therapy/Group: Individual Therapy  Crissie ReeseSandra H Niomie Englert 07/13/2018, 12:24 PM

## 2018-07-13 NOTE — Plan of Care (Signed)
  Problem: Consults Goal: RH GENERAL PATIENT EDUCATION Description See Patient Education module for education specifics. Outcome: Progressing   Problem: RH BLADDER ELIMINATION Goal: RH STG MANAGE BLADDER WITH EQUIPMENT WITH ASSISTANCE Description STG Manage Bladder With Equipment With total Assistance from caregiver of management of suprapubic cath  Outcome: Progressing   Problem: RH SKIN INTEGRITY Goal: RH STG SKIN FREE OF INFECTION/BREAKDOWN Description Max assist  Outcome: Progressing Goal: RH STG MAINTAIN SKIN INTEGRITY WITH ASSISTANCE Description STG Maintain Skin Integrity With min Assistance.  Outcome: Progressing Goal: RH STG ABLE TO PERFORM INCISION/WOUND CARE W/ASSISTANCE Description STG Able To Perform Incision/Wound Care With total Assistance.  Outcome: Progressing   Problem: RH SAFETY Goal: RH STG ADHERE TO SAFETY PRECAUTIONS W/ASSISTANCE/DEVICE Description STG Adhere to Safety Precautions With min Assistance/Device.  Outcome: Progressing   Problem: RH PAIN MANAGEMENT Goal: RH STG PAIN MANAGED AT OR BELOW PT'S PAIN GOAL Description 4 or less  Outcome: Progressing

## 2018-07-14 ENCOUNTER — Ambulatory Visit (HOSPITAL_COMMUNITY): Payer: Medicare Other

## 2018-07-14 NOTE — Progress Notes (Signed)
Subjective/Complaints: Slept poorly but has no reason why .  Denies breathing issue Family at bedside decline grounds pass  ROS neg CP, SOB, N/V/D  Objective: Vital Signs: Blood pressure (!) 132/59, pulse 61, temperature 98.4 F (36.9 C), temperature source Oral, resp. rate 17, height 5' 4"  (1.626 m), weight 93 kg, SpO2 97 %. No results found. Results for orders placed or performed during the hospital encounter of 07/11/18 (from the past 72 hour(s))  CBC WITH DIFFERENTIAL     Status: Abnormal   Collection Time: 07/12/18  4:47 AM  Result Value Ref Range   WBC 8.1 4.0 - 10.5 K/uL   RBC 2.71 (L) 3.87 - 5.11 MIL/uL   Hemoglobin 8.0 (L) 12.0 - 15.0 g/dL   HCT 25.8 (L) 36.0 - 46.0 %   MCV 95.2 78.0 - 100.0 fL   MCH 29.5 26.0 - 34.0 pg   MCHC 31.0 30.0 - 36.0 g/dL   RDW 15.9 (H) 11.5 - 15.5 %   Platelets 169 150 - 400 K/uL   Neutrophils Relative % 53 %   Neutro Abs 4.4 1.7 - 7.7 K/uL   Lymphocytes Relative 24 %   Lymphs Abs 1.9 0.7 - 4.0 K/uL   Monocytes Relative 19 %   Monocytes Absolute 1.6 (H) 0.1 - 1.0 K/uL   Eosinophils Relative 2 %   Eosinophils Absolute 0.2 0.0 - 0.7 K/uL   Basophils Relative 1 %   Basophils Absolute 0.0 0.0 - 0.1 K/uL   Immature Granulocytes 1 %   Abs Immature Granulocytes 0.1 0.0 - 0.1 K/uL    Comment: Performed at [facility], [address] [place], [place] [address]  Comprehensive metabolic panel     Status: Abnormal   Collection Time: 07/12/18  4:47 AM  Result Value Ref Range   Sodium 141 135 - 145 mmol/L   Potassium 3.6 3.5 - 5.1 mmol/L   Chloride 105 98 - 111 mmol/L   CO2 29 22 - 32 mmol/L   Glucose, Bld 107 (H) 70 - 99 mg/dL   BUN 26 (H) 8 - 23 mg/dL   Creatinine, Ser 1.16 (H) 0.44 - 1.00 mg/dL   Calcium 7.7 (L) 8.9 - 10.3 mg/dL   Total Protein 4.9 (L) 6.5 - 8.1 g/dL   Albumin 1.6 (L) 3.5 - 5.0 g/dL   AST 49 (H) 15 - 41 U/L   ALT 57 (H) 0 - 44 U/L   Alkaline Phosphatase 140 (H) 38 - 126 U/L   Total Bilirubin 0.6 0.3 - 1.2 mg/dL    GFR calc non Af Amer 41 (L) >60 mL/min   GFR calc Af Amer 47 (L) >60 mL/min    Comment: (NOTE) The eGFR has been calculated using the CKD EPI equation. This calculation has not been validated in all clinical situations. eGFR's persistently <60 mL/min signify possible Chronic Kidney Disease.    Anion gap 7 5 - 15    Comment: Performed at [facility] [address] [place], [place] [address]     HEENT: normal Cardio: RRR and no murmur Resp: unlabored GI: BS positive and NT, ND Extremity:  No Edema Skin:   Wound large stage 4 left sacral with W->Dry dressing Neuro: Alert/Oriented and Abnormal Motor 4/5 BUE, 3- BLE Musc/Skel:  Other Left thumb MCP swelling and tenderness Gen NAD   Assessment/Plan: 1. Functional deficits secondary to Debilitysecondary to sepsis fromFourniergangrene. Status post debridement of left gluteal necrotizing soft tissue infection 06/27/2018 followed by debridement of large  sacral perineal wound 06/29/2018, status post diverting colostomy placement and status post suprapubic placement 07/03/2018 which require 3+ hours per day of interdisciplinary therapy in a comprehensive inpatient rehab setting. Physiatrist is providing close team supervision and 24 hour management of active medical problems listed below. Physiatrist and rehab team continue to assess barriers to discharge/monitor patient progress toward functional and medical goals. FIM: Function - Bathing Position: Wheelchair/chair at sink Body parts bathed by patient: Right arm, Left arm, Chest, Abdomen, Right upper leg, Left upper leg Body parts bathed by helper: Front perineal area, Buttocks, Right lower leg, Left lower leg, Back  Function- Upper Body Dressing/Undressing What is the patient wearing?: Hospital gown Pull over shirt/dress - Perfomed by patient: Thread/unthread right sleeve, Thread/unthread left sleeve, Put head through opening Pull over shirt/dress - Perfomed by helper: Pull  shirt over trunk Function - Lower Body Dressing/Undressing What is the patient wearing?: Underwear, Shoes Position: Sitting EOB Underwear - Performed by patient: Thread/unthread right underwear leg, Thread/unthread left underwear leg, Pull underwear up/down Underwear - Performed by helper: Thread/unthread right underwear leg, Thread/unthread left underwear leg, Pull underwear up/down Shoes - Performed by patient: Don/doff right shoe, Don/doff left shoe Shoes - Performed by helper: Fasten right, Fasten left Assist for footwear: Supervision/touching assist Assist for lower body dressing: Touching or steadying assistance (Pt > 75%)  Function - Toileting Toileting activity did not occur: N/A  Function - Air cabin crew transfer assistive device: Elevated toilet seat/BSC over toilet Assist level to toilet: Touching or steadying assistance (Pt > 75%) Assist level from toilet: Maximal assist (Pt 25 - 49%/lift and lower)  Function - Chair/bed transfer Chair/bed transfer method: Stand pivot Chair/bed transfer assist level: Touching or steadying assistance (Pt > 75%) Chair/bed transfer assistive device: Armrests, Environmental consultant, Orthosis  Function - Locomotion: Wheelchair Assist Level: Dependent (Pt equals 0%)(for time management) Function - Locomotion: Ambulation Assistive device: Walker-rolling Max distance: 40 Assist level: 2 helpers(minA, w/c follow) Assist level: 2 helpers Walk 50 feet with 2 turns activity did not occur: Safety/medical concerns Walk 150 feet activity did not occur: Safety/medical concerns Walk 10 feet on uneven surfaces activity did not occur: Safety/medical concerns  Function - Comprehension Comprehension: Auditory Comprehension assist level: Follows complex conversation/direction with extra time/assistive device  Function - Expression Expression: Verbal Expression assist level: Expresses basic needs/ideas: With no assist  Function - Social Interaction Social  Interaction assist level: Interacts appropriately with others with medication or extra time (anti-anxiety, antidepressant).  Function - Problem Solving Problem solving assist level: Solves basic 90% of the time/requires cueing < 10% of the time  Function - Memory Memory assist level: Recognizes or recalls 90% of the time/requires cueing < 10% of the time Patient normally able to recall (first 3 days only): Current season, That he or she is in a hospital  Medical Problem List and Plan: 1.Debilitysecondary to sepsis fromFourniergangrene. Status post debridement of left gluteal necrotizing soft tissue infection 06/27/2018 followed by debridement of large sacral perineal wound 06/29/2018, status post diverting colostomy placement and status post suprapubic placement 07/03/2018. CIR OT, PT, SLP 2. DVT Prophylaxis/Anticoagulation: Subcutaneous Lovenox. Check vascular study 3. Pain Management:Ultram as needed 4. Mood:Zoloft 50 mg daily, also on chronic low dose xanax at night, per daughter not sleeping well will resume5. Neuropsych: This patientiscapable of making decisions on herown behalf. 6. Skin/Wound Care:Skin care as directed 7. Fluids/Electrolytes/Nutrition:Routine in and outs with follow-up chemistries 8.ID. Augmentin completed 9.Acute on chronic grade 2 diastolic dysfunction. Monitor for any signs of fluid overload.  Continue Lasix I 456m yesterday 10.AKI. Resolving. Creat 1.16 11.Hypothyroidism. Synthroid 12.Morbid obesity. BMI 40.58. Dietary follow-up 13.OSA-CPAP 14.  ABLA Hgb 8.0, stable 15.  Hx gout Left thumb MCP with flare up , improved with medrol dose pack,resume allopurinol after acute flare resolved    LOS (Days) 3 A FACE TO FACE EVALUATION WAS PERFORMED  ACharlett Blake8/17/2019, 7:12 AM

## 2018-07-14 NOTE — Progress Notes (Signed)
Pt has home CPAP at bedside.  

## 2018-07-14 NOTE — Progress Notes (Signed)
Changed dressing apprx 0400 as per order.

## 2018-07-14 NOTE — Progress Notes (Signed)
Physical Therapy Wound Treatment Patient Details  Name: [name] MRN: [id number] Date of Birth: 04-05-1928  Today's Date: 07/14/2018 PT Individual Time:  1101- 8338 ( 41 min )     Subjective  Subjective: Pt pleasant and agreeable to hydrotherapy. Patient and Family Stated Goals: "To keep dressing changed when wet."  per daughter Date of Onset: (unsure) Prior Treatments: Surgical debridement 06/27/18 and 06/29/18. On 8/6 pt underwent diverting colostomy and SP catheter placement.  Pain Score: Pain Score: 0-No pain  Wound Assessment  Pressure Injury 06/27/18 Unstageable - Full thickness tissue loss in which the base of the ulcer is covered by slough (yellow, tan, gray, green or brown) and/or eschar (tan, brown or black) in the wound bed. Three areas of black eschar surrounded by blan (Active)  Wound Image    07/09/2018 12:30 PM  Dressing Type ABD;Gauze (Comment);Moist to dry;Paper tape 07/14/2018  1:44 PM  Dressing Changed 07/14/2018  1:44 PM  Dressing Change Frequency Daily 07/14/2018  1:44 PM  State of Healing Early/partial granulation 07/14/2018  1:44 PM  Site / Wound Assessment Pink;Yellow;Red 07/14/2018  1:44 PM  % Wound base Red or Granulating 85% 07/14/2018  1:44 PM  % Wound base Yellow/Fibrinous Exudate 15% 07/14/2018  1:44 PM  % Wound base Black/Eschar 0% 07/13/2018 12:48 PM  % Wound base Other/Granulation Tissue (Comment) 0% 07/14/2018  1:44 PM  Peri-wound Assessment Intact;Erythema (blanchable) 07/14/2018  1:44 PM  Wound Length (cm) 17.2 cm 07/09/2018 10:23 AM  Wound Width (cm) 15.9 cm 07/09/2018 10:23 AM  Wound Depth (cm) 3 cm 07/09/2018 10:23 AM  Wound Surface Area (cm^2) 273.48 cm^2 07/09/2018 10:23 AM  Wound Volume (cm^3) 820.44 cm^3 07/09/2018 10:23 AM  Tunneling (cm) 7.4 cm tunnel in center of wound 07/09/2018 12:30 PM  Undermining (cm) 5.2 cm 9-11:00; 1.6 cm 6-8:00; 1.5 cm 11-12:00 07/09/2018 12:30 PM  Margins Unattached edges (unapproximated) 07/14/2018  1:44 PM  Drainage Amount  Minimal 07/14/2018  1:44 PM  Drainage Description Serosanguineous 07/14/2018  1:44 PM  Treatment Debridement (Selective);Hydrotherapy (Pulse lavage);Packing (Saline gauze) 07/13/2018 12:48 PM     Dressing Type ABD;Gauze (Comment);Moist to dry;Other (Comment) 07/12/2018 11:14 AM  Dressing Changed 07/12/2018  3:42 PM  Dressing Change Frequency Twice a day 07/12/2018 11:14 AM  Site / Wound Assessment Red;Other (Comment) 07/12/2018 11:14 AM  Margins Unattached edges (unapproximated) 07/12/2018 11:14 AM  Closure None 07/12/2018 11:14 AM  Drainage Amount Moderate 07/12/2018 11:14 AM  Drainage Description Serosanguineous 07/12/2018 11:14 AM  Treatment Cleansed;Other (Comment) 07/12/2018  8:00 PM     Santyl applied to wound bed prior to applying dressing.  Hydrotherapy Pulsed lavage therapy - wound location: Buttock Pulsed Lavage with Suction (psi): 12 psi Pulsed Lavage with Suction - Normal Saline Used: 1000 mL Pulsed Lavage Tip: Tip with splash shield Selective Debridement Selective Debridement - Location: Buttock Selective Debridement - Tools Used: Forceps;Scissors Selective Debridement - Tissue Removed: Yellow slough where leading edge is present. Slough is very adherent.     Wound Assessment and Plan  Wound Therapy - Assess/Plan/Recommendations Wound Therapy - Clinical Statement: Wound bed continues to appear improved. Peri-wound area appears less red and blisters appear to be drying up. Discussed with LPN recommendations for paper tape and use of skin prep with each dressing change. Pt will benefit from continued hydrotherapy for selective removal of unviable tissue, to decrease bioburden and promote wound bed healing. Wound Therapy - Functional Problem List: Decreased tolerance for OOB mobility/position changes Factors Delaying/Impairing Wound Healing: Immobility;Multiple medical problems Hydrotherapy  Plan: Debridement;Dressing change;Patient/family education;Pulsatile lavage with suction Wound  Therapy - Frequency: 6X / week Wound Therapy - Follow Up Recommendations: Home health RN Wound Plan: See above  Wound Therapy Goals- Improve the function of patient's integumentary system by progressing the wound(s) through the phases of wound healing (inflammation - proliferation - remodeling) by: Increase Granulation Tissue - Progress: Progressing toward goal Goals/treatment plan/discharge plan were made with and agreed upon by patient/family: Yes Time For Goal Achievement: 7 days Wound Therapy - Potential for Goals: Good  Goals will be updated until maximal potential achieved or discharge criteria met.  Discharge criteria: when goals achieved, discharge from hospital, MD decision/surgical intervention, no progress towards goals, refusal/missing three consecutive treatments without notification or medical reason.  GP     Sarika Baldini Eli Hose 07/14/2018, 1:55 PM Governor Rooks, PTA pager [phone]

## 2018-07-15 ENCOUNTER — Inpatient Hospital Stay (HOSPITAL_COMMUNITY): Payer: Medicare Other | Admitting: Physical Therapy

## 2018-07-15 ENCOUNTER — Inpatient Hospital Stay (HOSPITAL_COMMUNITY): Payer: Medicare Other | Admitting: Occupational Therapy

## 2018-07-15 DIAGNOSIS — M109 Gout, unspecified: Secondary | ICD-10-CM

## 2018-07-15 MED ORDER — ALLOPURINOL 100 MG PO TABS
100.0000 mg | ORAL_TABLET | Freq: Every day | ORAL | Status: DC
  Administered 2018-07-15 – 2018-07-19 (×5): 100 mg via ORAL
  Filled 2018-07-15 (×5): qty 1

## 2018-07-15 NOTE — Progress Notes (Signed)
Patient has home CPAP at bedside and places herself on and off as needed. Will call if any issues arise

## 2018-07-15 NOTE — Plan of Care (Signed)
  Problem: Consults Goal: RH GENERAL PATIENT EDUCATION Description See Patient Education module for education specifics. Outcome: Progressing   Problem: RH BLADDER ELIMINATION Goal: RH STG MANAGE BLADDER WITH EQUIPMENT WITH ASSISTANCE Description STG Manage Bladder With Equipment With total Assistance from caregiver of management of suprapubic cath  Outcome: Progressing   Problem: RH SKIN INTEGRITY Goal: RH STG SKIN FREE OF INFECTION/BREAKDOWN Description Max assist  Outcome: Progressing Goal: RH STG MAINTAIN SKIN INTEGRITY WITH ASSISTANCE Description STG Maintain Skin Integrity With min Assistance.  Outcome: Progressing Goal: RH STG ABLE TO PERFORM INCISION/WOUND CARE W/ASSISTANCE Description STG Able To Perform Incision/Wound Care With total Assistance.  Outcome: Progressing   Problem: RH PAIN MANAGEMENT Goal: RH STG PAIN MANAGED AT OR BELOW PT'S PAIN GOAL Description 4 or less  Outcome: Progressing

## 2018-07-15 NOTE — Progress Notes (Signed)
Physical Therapy Session Note  Patient Details  Name: [name] MRN: [id number] Date of Birth: 08/02/1928  Today's Date: 07/15/2018 PT Individual Time: 0800-0913 PT Individual Time Calculation (min): 73 min   Short Term Goals: Week 1:  PT Short Term Goal 1 (Week 1): Pt will perform bed mobility modA PT Short Term Goal 2 (Week 1): Pt will perform sit <>stand modA +1 PT Short Term Goal 3 (Week 1): Pt will perform gait with LRAD x50' minA PT Short Term Goal 4 (Week 1): Pt will ascend/descent 2 steps modA  Skilled Therapeutic Interventions/Progress Updates:  Pt was seen bedside in the am, sitting on edge of bed with family at bedside. Dependent to donn R ankle brace and B shoes. Pt transferred edge of bed to chair with rolling walker and min with verbal cues. Pt transported to rehab gym. Im gym treatment focused on LE strengthening and gait. Pt performed multiple sit to stand transfers with rolling walker and min A with verbal cues. Pt ambulated 50 feet x 3 with rolling walker and min A with verbal cues. Pt performed step taps and alternating step taps 3 sets x 5 reps each. Pt returned to room following treatment and left sitting up in w/c with family at bedside.   Therapy Documentation Precautions:  Precautions Precautions: Fall Precaution Comments: colostomy, suprapubic catheter, large buttock wound Required Braces or Orthoses: Other Brace/Splint Other Brace/Splint: R ankle brace, shoes when OOB per family request Restrictions Weight Bearing Restrictions: No General:   Pain: No c/o pain.   See Function Navigator for Current Functional Status.   Therapy/Group: Individual Therapy  [name], Jimesha Rising G 07/15/2018, 12:02 PM

## 2018-07-15 NOTE — Progress Notes (Signed)
Patient's daughter was involved in changing ostomy bag X 2 today. She was also involved in chanigng the outer bandage on her rectum. Lorri FrederickMartha E Hanner, LPN

## 2018-07-15 NOTE — Progress Notes (Signed)
Occupational Therapy Session Note  Patient Details  Name: Rebecca KayGracie B Jorden MRN: [id number] Date of Birth: 05/15/1928  Today's Date: 07/15/2018 OT Individual Time: 1000-1100 OT Individual Time Calculation (min): 60 min    Short Term Goals: Week 1:  OT Short Term Goal 1 (Week 1): Pt will thread B LEs into underwear with supervision using AE PRN OT Short Term Goal 2 (Week 1): Pt will complete clothing management in order to access ostomy with steadying assist standing at toilet OT Short Term Goal 3 (Week 1): Pt will complete two grooming tasks in standing with steadying assist in order to increase functional activity tolerance  Skilled Therapeutic Interventions/Progress Updates:    Pt seen for OT ADL bathing/dressing session and session focusing on modified toileting tasks. Pt sitting up in w/c upon arrival with daughter present, agreeable to tx session. Voicing increase in fatigue from previous PT session but willing to participate in therapy as able. Completed UB bathing standing at sink, forearms on sink ledge for support and energy conservation. She demosntrates good functional standing balance, able to let go with B UEs in order to bathe UB and doffed clothing from standing position with close supervision. She tolerated ~5 minutes in standing before requiring seated rest break. She completed LB bathing with set-up assist- buttock and pericare not completed 2/2 wound dressing.  She was taken to ADL apartment total A in w/c for time and energy conservation. Education and demonstration provided for modified techniques for pt to be able to manage ostomy independently utilizing toilet. Due to pt's body habitus, pt unable to sit on toilet and position self in way to empty pouch into toilet. Therefore, completed in standing position with pt manageing RW over toilet in order to empty. Pt able to complete clothing management of lifting gown and emptying own ostomy into toilet with close superviison and VCs for  technique of ostomy bag management. Pt's daughter present during this and was able to assist pt with proper cuing. She required seated rst break following emptying of contents and hygiene/clean up completed from seated position.  Pt returned to room. Completed hands on training with pt's daughter who is here daily in order to assist with stand pivot transfer bed<> w/c, marked on safety plan. Pt returned to sidelying with mod A at end of session for dressing change to be completed, left with all needs in reach and daughter present. Pt's RN made aware.    Therapy Documentation Precautions:  Precautions Precautions: Fall Precaution Comments: colostomy, suprapubic catheter, large buttock wound Required Braces or Orthoses: Other Brace/Splint Other Brace/Splint: R ankle brace, shoes when OOB per family request Restrictions Weight Bearing Restrictions: No  See Function Navigator for Current Functional Status.   Therapy/Group: Individual Therapy  Shahzad Thomann L 07/15/2018, 6:45 AM

## 2018-07-15 NOTE — Progress Notes (Signed)
Subjective/Complaints:  No issues overnight , pt feels good  ROS neg CP, SOB, N/V/D  Objective: Vital Signs: Blood pressure 118/67, pulse 63, temperature 98 F (36.7 C), temperature source Oral, resp. rate 16, height 5\' 4"  (1.626 m), weight 90 kg, SpO2 97 %. No results found. No results found for this or any previous visit (from the past 72 hour(s)).   HEENT: normal Cardio: RRR and no murmur Resp: unlabored GI: BS positive and NT, ND Extremity:  No Edema Skin:   Wound large stage 4 left sacral with W->Dry dressing Neuro: Alert/Oriented and Abnormal Motor 4/5 BUE, 3- BLE Musc/Skel:  Other Left thumb MCP swelling and tenderness Gen NAD   Assessment/Plan: 1. Functional deficits secondary to Debilitysecondary to sepsis fromFourniergangrene. Status post debridement of left gluteal necrotizing soft tissue infection 06/27/2018 followed by debridement of large sacral perineal wound 06/29/2018, status post diverting colostomy placement and status post suprapubic placement 07/03/2018 which require 3+ hours per day of interdisciplinary therapy in a comprehensive inpatient rehab setting. Physiatrist is providing close team supervision and 24 hour management of active medical problems listed below. Physiatrist and rehab team continue to assess barriers to discharge/monitor patient progress toward functional and medical goals. FIM: Function - Bathing Position: Wheelchair/chair at sink Body parts bathed by patient: Right arm, Left arm, Chest, Abdomen, Right upper leg, Left upper leg Body parts bathed by helper: Front perineal area, Buttocks, Right lower leg, Left lower leg, Back  Function- Upper Body Dressing/Undressing What is the patient wearing?: Hospital gown Pull over shirt/dress - Perfomed by patient: Thread/unthread right sleeve, Thread/unthread left sleeve, Put head through opening Pull over shirt/dress - Perfomed by helper: Pull shirt over trunk Function - Lower Body  Dressing/Undressing What is the patient wearing?: Underwear, Shoes Position: Sitting EOB Underwear - Performed by patient: Thread/unthread right underwear leg, Thread/unthread left underwear leg, Pull underwear up/down Underwear - Performed by helper: Thread/unthread right underwear leg, Thread/unthread left underwear leg, Pull underwear up/down Shoes - Performed by patient: Don/doff right shoe, Don/doff left shoe Shoes - Performed by helper: Fasten right, Fasten left Assist for footwear: Supervision/touching assist Assist for lower body dressing: Touching or steadying assistance (Pt > 75%)  Function - Toileting Toileting activity did not occur: N/A  Function - ArchivistToilet Transfers Toilet transfer activity did not occur: N/A Toilet transfer assistive device: Elevated toilet seat/BSC over toilet Assist level to toilet: Touching or steadying assistance (Pt > 75%) Assist level from toilet: Maximal assist (Pt 25 - 49%/lift and lower)  Function - Chair/bed transfer Chair/bed transfer method: Stand pivot Chair/bed transfer assist level: Touching or steadying assistance (Pt > 75%) Chair/bed transfer assistive device: Armrests, Environmental consultantWalker, Orthosis  Function - Locomotion: Wheelchair Assist Level: Dependent (Pt equals 0%)(for time management) Function - Locomotion: Ambulation Assistive device: Walker-rolling Max distance: 40 Assist level: 2 helpers(minA, w/c follow) Assist level: 2 helpers Walk 50 feet with 2 turns activity did not occur: Safety/medical concerns Walk 150 feet activity did not occur: Safety/medical concerns Walk 10 feet on uneven surfaces activity did not occur: Safety/medical concerns  Function - Comprehension Comprehension: Auditory Comprehension assist level: Follows complex conversation/direction with extra time/assistive device  Function - Expression Expression: Verbal Expression assist level: Expresses complex ideas: With no assist  Function - Social  Interaction Social Interaction assist level: Interacts appropriately with others with medication or extra time (anti-anxiety, antidepressant).  Function - Problem Solving Problem solving assist level: Solves basic 90% of the time/requires cueing < 10% of the time  Function - Memory Memory assist  level: Recognizes or recalls 90% of the time/requires cueing < 10% of the time Patient normally able to recall (first 3 days only): Current season, That he or she is in a hospital  Medical Problem List and Plan: 1.Debilitysecondary to sepsis fromFourniergangrene. Status post debridement of left gluteal necrotizing soft tissue infection 06/27/2018 followed by debridement of large sacral perineal wound 06/29/2018, status post diverting colostomy placement and status post suprapubic placement 07/03/2018. CIR OT, PT, SLP 2. DVT Prophylaxis/Anticoagulation: Subcutaneous Lovenox. Check vascular study 3. Pain Management:Ultram as needed 4. Mood:Zoloft 50 mg daily, also on chronic low dose xanax at night, per daughter not sleeping well will resume5. Neuropsych: This patientiscapable of making decisions on herown behalf. 6. Skin/Wound Care:Skin care as directed 7. Fluids/Electrolytes/Nutrition:Routine in and outs with follow-up chemistries 8.ID. Augmentin completed 9.Acute on chronic grade 2 diastolic dysfunction. Monitor for any signs of fluid overload. Continue Lasix I 600ml yesterday 10.AKI. Resolving. Creat 1.16, recheck BMET in am since allopurinol resumed, pt on Lasix  11.Hypothyroidism. Synthroid 12.Morbid obesity. BMI 40.58. Dietary follow-up 13.OSA-CPAP 14.  ABLA Hgb 8.0, stable 15.  Hx gout Left thumb MCP with flare up , improved with medrol dose pack,resume allopurinol after acute flare resolved    LOS (Days) 4 A FACE TO FACE EVALUATION WAS PERFORMED  Erick Colacendrew E Cheron Pasquarelli 07/15/2018, 7:59 AM

## 2018-07-15 NOTE — Progress Notes (Signed)
Physical Therapy Session Note  Patient Details  Name: [name] MRN: [id number] Date of Birth: 03/15/1928  Today's Date: 07/15/2018 PT Individual Time: 1345-1440 PT Individual Time Calculation (min): 55 min   Short Term Goals: Week 1:  PT Short Term Goal 1 (Week 1): Pt will perform bed mobility modA PT Short Term Goal 2 (Week 1): Pt will perform sit <>stand modA +1 PT Short Term Goal 3 (Week 1): Pt will perform gait with LRAD x50' minA PT Short Term Goal 4 (Week 1): Pt will ascend/descent 2 steps modA  Skilled Therapeutic Interventions/Progress Updates:  Pt was seen bedside in the pm sitting on edge of bed. Pt transferred sit to stand from edge of bed with min A and rolling walker. Pt ambulated about 10 feet with rolling walker and min A with verbal cues. Pt ambulated 20 feet x 2 and 25 feet x 2 with rolling walker and min A with verbal cues. Pt ascended/descended 4" curb x 2 with rolling walker and min to mod A with verbal cues. Pt ascended/descended 2 steps with B rails and min to mod A with verbal cues. Pt returned to room. Pt transferred w/c to edge of bed with rolling walker and min A with verbal cues. Pt transferred edge of bed to supine with mod A and verbal cues. Pt's nurse at bedside to complete dressing reinforcement.    Therapy Documentation Precautions:  Precautions Precautions: Fall Precaution Comments: colostomy, suprapubic catheter, large buttock wound Required Braces or Orthoses: Other Brace/Splint Other Brace/Splint: R ankle brace, shoes when OOB per family request Restrictions Weight Bearing Restrictions: No General:   Pain: No c/o pain.   See Function Navigator for Current Functional Status.   Therapy/Group: Individual Therapy  [name], [name] 07/15/2018, 3:37 PM

## 2018-07-16 ENCOUNTER — Ambulatory Visit (HOSPITAL_COMMUNITY): Payer: Medicare Other

## 2018-07-16 ENCOUNTER — Inpatient Hospital Stay (HOSPITAL_COMMUNITY): Payer: Medicare Other | Admitting: Occupational Therapy

## 2018-07-16 ENCOUNTER — Encounter (HOSPITAL_COMMUNITY): Payer: Medicare Other | Admitting: Psychology

## 2018-07-16 ENCOUNTER — Inpatient Hospital Stay (HOSPITAL_COMMUNITY): Payer: Medicare Other | Admitting: Physical Therapy

## 2018-07-16 DIAGNOSIS — L89159 Pressure ulcer of sacral region, unspecified stage: Secondary | ICD-10-CM

## 2018-07-16 DIAGNOSIS — I5033 Acute on chronic diastolic (congestive) heart failure: Secondary | ICD-10-CM

## 2018-07-16 DIAGNOSIS — N179 Acute kidney failure, unspecified: Secondary | ICD-10-CM

## 2018-07-16 DIAGNOSIS — M109 Gout, unspecified: Secondary | ICD-10-CM

## 2018-07-16 DIAGNOSIS — D62 Acute posthemorrhagic anemia: Secondary | ICD-10-CM

## 2018-07-16 LAB — BASIC METABOLIC PANEL
ANION GAP: 8 (ref 5–15)
BUN: 31 mg/dL — ABNORMAL HIGH (ref 8–23)
CHLORIDE: 104 mmol/L (ref 98–111)
CO2: 31 mmol/L (ref 22–32)
Calcium: 8.5 mg/dL — ABNORMAL LOW (ref 8.9–10.3)
Creatinine, Ser: 1.18 mg/dL — ABNORMAL HIGH (ref 0.44–1.00)
GFR calc non Af Amer: 40 mL/min — ABNORMAL LOW (ref >60)
GFR, EST AFRICAN AMERICAN: 46 mL/min — AB (ref >60)
Glucose, Bld: 86 mg/dL (ref 70–99)
Potassium: 4.3 mmol/L (ref 3.5–5.1)
Sodium: 143 mmol/L (ref 135–145)

## 2018-07-16 NOTE — Plan of Care (Signed)
  Problem: RH SKIN INTEGRITY Goal: RH STG ABLE TO PERFORM INCISION/WOUND CARE W/ASSISTANCE Description STG Able To Perform Incision/Wound Care With total Assistance.  Outcome: Progressing  Administered dressing change as ordered and as needed Problem: RH PAIN MANAGEMENT Goal: RH STG PAIN MANAGED AT OR BELOW PT'S PAIN GOAL Description 4 or less  Outcome: Progressing  Administered pain regimen as ordered.

## 2018-07-16 NOTE — Progress Notes (Signed)
Administered pain regimen as needed. Both daughters participated in dressing changes, cath dsg changed by daughter, colostomy bag changed by other daughter, and both has observed the dressing changes on the buttock and participated. Family is willing, ready to learn.

## 2018-07-16 NOTE — Progress Notes (Signed)
Physical Therapy Session Note  Patient Details  Name: [name] MRN: [id number] Date of Birth: 03/03/1928  Today's Date: 07/16/2018 PT Individual Time: 1130-1200 and 1415-1530 PT Individual Time Calculation (min): 30 min and 75 min (total 115 min)   Short Term Goals: Week 1:  PT Short Term Goal 1 (Week 1): Pt will perform bed mobility modA PT Short Term Goal 2 (Week 1): Pt will perform sit <>stand modA +1 PT Short Term Goal 3 (Week 1): Pt will perform gait with LRAD x50' minA PT Short Term Goal 4 (Week 1): Pt will ascend/descent 2 steps modA  Skilled Therapeutic Interventions/Progress Updates: Tx 1: pt received seated in bed, denies pain at rest and agreeable to treatment. Supine>sit with HOB slightly elevated, bedrails and daughter providing minA after pt unable to perform unassisted. Ambulatory transfer with RW bed>w/c min guard including sidestepping through narrow walkways, min cues for technique. Gait x50', 125' with RW and min guard, slow speed and occasional standing rest breaks d/t fatigue. Ascent/descent two steps 6" height with BUE on one handrail to simulate home environment, minA. Returned to room with second gait trial as above. Remained seated on EOB with daughter present, setup with table for lunch, all needs in reach.   Tx 2: pt received R sidelying in bed; denies pain and agreeable to treatment. Rolling R>L with S. Sidelying>sit minA with HOB elevated and bedrails. Gait with RW and min guard x100', slow speed. Performed transfer from low recliner with modA to boost to standing; pt reports recliner at home is not as low, and is a lift chair which she can use if needed, but discussed encouraging pt to stand without it to reduce reliance. Gait with L hall rail and RUE HHA x25' with min guard; L trendelenburg d/t L glute med weakness. Side stepping and backwards walking with hall rail 1x15' each. Gait navigating around cones with RW and min guard for focus on direction changes and RW  management. Instructed pt in LE strengthening exercises as below in sitting/standing and provided handout for HEP to continue performing independently in room and at home following d/c.   Exercises  Seated March - 10 reps - 3 sets - 2x daily - 7x weekly  Seated Long Arc Quad - 10 reps - 3 sets - 2x daily - 7x weekly  Seated Hip Adduction Isometrics with Ball - 10 reps - 3 sets - 2x daily - 7x weekly  Seated Gluteal Sets - 10 reps - 3 sets - 2x daily - 7x weekly  Seated Ankle Plantarflexion with Resistance - 10 reps - 3 sets - 2x daily - 7x weekly  Standing Heel Raise with Support - 10 reps - 3 sets - 2x daily - 7x weekly  Standing March with Counter Support - 10 reps - 3 sets - 2x daily - 7x weekly   Returned to room totalA in w/c at end of session, daughter present and all needs in reach.       Therapy Documentation Precautions:  Precautions Precautions: Fall Precaution Comments: colostomy, suprapubic catheter, large buttock wound Required Braces or Orthoses: Other Brace/Splint Other Brace/Splint: R ankle brace, shoes when OOB per family request Restrictions Weight Bearing Restrictions: No   See Function Navigator for Current Functional Status.   Therapy/Group: Individual Therapy  Harlon Dittylizabeth J Mackie Goon 07/16/2018, 12:03 PM

## 2018-07-16 NOTE — Progress Notes (Signed)
Subjective/Complaints: Patient seen sitting up at the edge of the bed this morning. Family at bedside. Patient states she slept fairly overnight. She states she had a busy weekend.  Denies CP, SOB, N/V/D  Objective: Vital Signs: Blood pressure (!) 102/54, pulse 66, temperature (!) 97.5 F (36.4 C), temperature source Oral, resp. rate 18, height 5' 4"  (1.626 m), weight 90.1 kg, SpO2 96 %. No results found. Results for orders placed or performed during the hospital encounter of 07/11/18 (from the past 72 hour(s))  Basic metabolic panel     Status: Abnormal   Collection Time: 07/16/18  7:22 AM  Result Value Ref Range   Sodium 143 135 - 145 mmol/L   Potassium 4.3 3.5 - 5.1 mmol/L   Chloride 104 98 - 111 mmol/L   CO2 31 22 - 32 mmol/L   Glucose, Bld 86 70 - 99 mg/dL   BUN 31 (H) 8 - 23 mg/dL   Creatinine, Ser 1.18 (H) 0.44 - 1.00 mg/dL   Calcium 8.5 (L) 8.9 - 10.3 mg/dL   GFR calc non Af Amer 40 (L) >60 mL/min   GFR calc Af Amer 46 (L) >60 mL/min    Comment: (NOTE) The eGFR has been calculated using the CKD EPI equation. This calculation has not been validated in all clinical situations. eGFR's persistently <60 mL/min signify possible Chronic Kidney Disease.    Anion gap 8 5 - 15    Comment: Performed at [facility] [address], [place], [place] [address]     Constitutional: No distress . Vital signs reviewed. HENT: Normocephalic.  Atraumatic. Eyes: EOMI. No discharge. Cardiovascular: RRR. No JVD. Respiratory: CTA Bilaterally. Normal effort. GI: BS +. Non-distended. GU: Foley in place Musc: No edema or tenderness in extremities. Neuro:  Alert/Oriented x3 Motor: Bilateral upper extremities: 4+/5 proximal distal Right lower extremity: Hip flexion 2+/5, knee extension 4 -/5, ankle dorsiflexion 4+/5 Left lower extremity: Hip flexion: 3 -/5, knee extension 4 -/5, ankle dorsiflexion 4+/5 Skin:   Wound large stage 4 left sacral with W->Dry dressing, not examined  today Psych: Slowed   Assessment/Plan: 1. Functional deficits secondary to Debilitysecondary to sepsis fromFourniergangrene. Status post debridement of left gluteal necrotizing soft tissue infection 06/27/2018 followed by debridement of large sacral perineal wound 06/29/2018, status post diverting colostomy placement and status post suprapubic placement 07/03/2018 which require 3+ hours per day of interdisciplinary therapy in a comprehensive inpatient rehab setting. Physiatrist is providing close team supervision and 24 hour management of active medical problems listed below. Physiatrist and rehab team continue to assess barriers to discharge/monitor patient progress toward functional and medical goals. FIM: Function - Bathing Position: Wheelchair/chair at sink Body parts bathed by patient: Right arm, Left arm, Chest, Abdomen, Right upper leg, Left upper leg Body parts bathed by helper: Front perineal area, Buttocks, Right lower leg, Left lower leg, Back  Function- Upper Body Dressing/Undressing What is the patient wearing?: Hospital gown Pull over shirt/dress - Perfomed by patient: Thread/unthread right sleeve, Thread/unthread left sleeve, Put head through opening Pull over shirt/dress - Perfomed by helper: Pull shirt over trunk Function - Lower Body Dressing/Undressing What is the patient wearing?: Underwear, Shoes Position: Sitting EOB Underwear - Performed by patient: Thread/unthread right underwear leg, Thread/unthread left underwear leg, Pull underwear up/down Underwear - Performed by helper: Thread/unthread right underwear leg, Thread/unthread left underwear leg, Pull underwear up/down Shoes - Performed by patient: Don/doff right shoe, Don/doff left shoe Shoes - Performed by helper: Fasten right, Fasten left Assist for  footwear: Supervision/touching assist Assist for lower body dressing: Touching or steadying assistance (Pt > 75%)  Function - Toileting Toileting activity did not  occur: N/A Toileting steps completed by patient: Adjust clothing prior to toileting, Performs perineal hygiene, Adjust clothing after toileting Assist level: Touching or steadying assistance (Pt.75%)(Pt managing ostomy bag from standing position)  Function - Toilet Transfers Toilet transfer activity did not occur: N/A Toilet transfer assistive device: Elevated toilet seat/BSC over toilet Assist level to toilet: Touching or steadying assistance (Pt > 75%) Assist level from toilet: Maximal assist (Pt 25 - 49%/lift and lower)  Function - Chair/bed transfer Chair/bed transfer method: Stand pivot Chair/bed transfer assist level: Touching or steadying assistance (Pt > 75%) Chair/bed transfer assistive device: Armrests, Environmental consultant, Orthosis  Function - Locomotion: Wheelchair Assist Level: Dependent (Pt equals 0%)(for time management) Function - Locomotion: Ambulation Assistive device: Walker-rolling Max distance: 25 Assist level: Touching or steadying assistance (Pt > 75%) Assist level: Touching or steadying assistance (Pt > 75%) Walk 50 feet with 2 turns activity did not occur: Safety/medical concerns Assist level: Touching or steadying assistance (Pt > 75%) Walk 150 feet activity did not occur: Safety/medical concerns Walk 10 feet on uneven surfaces activity did not occur: Safety/medical concerns  Function - Comprehension Comprehension: Auditory Comprehension assist level: Follows complex conversation/direction with extra time/assistive device  Function - Expression Expression: Verbal Expression assist level: Expresses complex ideas: With no assist  Function - Social Interaction Social Interaction assist level: Interacts appropriately with others with medication or extra time (anti-anxiety, antidepressant).  Function - Problem Solving Problem solving assist level: Solves basic 90% of the time/requires cueing < 10% of the time  Function - Memory Memory assist level: Recognizes or  recalls 90% of the time/requires cueing < 10% of the time Patient normally able to recall (first 3 days only): Current season, That he or she is in a hospital  Medical Problem List and Plan: 1.Debilitysecondary to sepsis fromFourniergangrene. Status post debridement of left gluteal necrotizing soft tissue infection 06/27/2018 followed by debridement of large sacral perineal wound 06/29/2018, status post diverting colostomy placement and status post suprapubic placement 07/03/2018.  Cont CIR   Notes reviewed - debility after large sacral decub with gout flare, images reviewed - CMC arthritis, labs reviewed 2. DVT Prophylaxis/Anticoagulation: Subcutaneous Lovenox.   Dopplers neg for DVT 3. Pain Management:Ultram as needed 4. Mood:Zoloft 50 mg daily, also on chronic low dose xanax at night, per daughter not sleeping well will resumed  5. Neuropsych: This patientiscapable of making decisions on herown behalf. 6. Skin/Wound Care:Skin care as directed 7. Fluids/Electrolytes/Nutrition:Routine in and outs  8.ID. Augmentin completed 9.Acute on chronic grade 2 diastolic dysfunction. Monitor for any signs of fluid overload. Continue Lasix  Filed Weights   07/14/18 0410 07/15/18 0337 07/16/18 0346  Weight: 93 kg 90 kg 90.1 kg   10.AKI.   Creatinine 1.18 on 8/19  Encourage fluids 11.Hypothyroidism. Synthroid 12.Morbid obesity. BMI 40.58. Dietary follow-up 13.OSA-CPAP 14.  ABLA   Hemoglobin 8.0 on 8/15  Labs ordered for tomorrow 15.  Hx gout Left thumb MCP with flare up , improved with medrol dose pack, resumed allopurinol   Xray reviewed - showing CMC arthritis  Continue to monitor  LOS (Days) 5 A FACE TO FACE EVALUATION WAS PERFORMED  Juquan Reznick Lorie Phenix 07/16/2018, 9:18 AM

## 2018-07-16 NOTE — Progress Notes (Signed)
Patient has home CPAP unit at bedside with home settings and is able to place self on and off when ready. RT informed patient to have RN contact RT if she has any trouble.

## 2018-07-16 NOTE — Progress Notes (Signed)
Occupational Therapy Session Note  Patient Details  Name: [name] MRN: [id number] Date of Birth: 06/12/1928  Today's Date: 07/16/2018 OT Individual Time: 3086-57840730-0830 OT Individual Time Calculation (min): 60 min    Short Term Goals: Week 1:  OT Short Term Goal 1 (Week 1): Pt will thread B LEs into underwear with supervision using AE PRN OT Short Term Goal 2 (Week 1): Pt will complete clothing management in order to access ostomy with steadying assist standing at toilet OT Short Term Goal 3 (Week 1): Pt will complete two grooming tasks in standing with steadying assist in order to increase functional activity tolerance  Skilled Therapeutic Interventions/Progress Updates:    PT seen for OT session focusing on ADL re-training, functional standing balance and tolerance. Pt sitting EOB upon arrival with daughter present, pt agreeable to tx session. Voiced "soreness" all over from previous day's therapy session, though willing to participate in session as able. Throughout session, she required min- occasional mod A for sit>stand. She ambulated with RW and close supervision. UB bathing and grooming tasks completed standing at sink, leaning on forearms for support. Following seated rest break, pt stood in order for daughter to re-apply cover bandage on buttock that was seeping. Pt taken to ADL apartment total A in w/c for time and energy conservation. Completed ambulation within kitchen with supervision and stood at cabinet to retrieve and replace items in overhead cabinet. She then returned to w/c with close. supervision. Pt tolerating ~3-4 minutes in standing before seated rest break where she stated "I'm really tired". Education provided throughout session regarding energy conservation, importance of participation and independence with ADLs, return to activity, set-up of kitchen for easier accessibility, continuum of care, DME, and d/c planning.  Pt returned to room in w/c and transitioned back to  sitting EOB. Pt left seated EOB at end of session with family to supervise.   Therapy Documentation Precautions:  Precautions Precautions: Fall Precaution Comments: colostomy, suprapubic catheter, large buttock wound Required Braces or Orthoses: Other Brace/Splint Other Brace/Splint: R ankle brace, shoes when OOB per family request Restrictions Weight Bearing Restrictions: No Pain: Pain Assessment Pain Score: Asleep  See Function Navigator for Current Functional Status.   Therapy/Group: Individual Therapy  [name] L 07/16/2018, 6:58 AM

## 2018-07-16 NOTE — Progress Notes (Signed)
Physical Therapy Wound Treatment Patient Details  Name: JAKEYA GHERARDI MRN: [id number] Date of Birth: [birth year]  Today's Date: 07/16/2018 PT Individual Time: 1009-1102 PT Individual Time Calculation (min): 53 min   Subjective  Subjective: Pt pleasant and agreeable to hydrotherapy. Emotional at end of session regarding return home without staff to assist her.  Patient and Family Stated Goals: Per daughter, learn to pack wound to prep for d/c Date of Onset: (unsure) Prior Treatments: Surgical debridement 06/27/18 and 06/29/18. On 8/6 pt underwent diverting colostomy and SP catheter placement.  Pain Score: Pt received pain medication during session and had minimal complaints of pain.   Wound Assessment  Pressure Injury 06/27/18 Unstageable - Full thickness tissue loss in which the base of the ulcer is covered by slough (yellow, tan, gray, green or brown) and/or eschar (tan, brown or black) in the wound bed. Three areas of black eschar surrounded by blan (Active)  Wound Image   07/16/2018 12:47 PM  Dressing Type ABD;Gauze (Comment);Barrier Film (skin prep);Moist to dry;Paper tape 07/16/2018 12:47 PM  Dressing Changed 07/16/2018 12:47 PM  Dressing Change Frequency Daily 07/16/2018 12:47 PM  State of Healing Early/partial granulation 07/16/2018 12:47 PM  Site / Wound Assessment Pink;Red;Yellow 07/16/2018 12:47 PM  % Wound base Red or Granulating 85% 07/16/2018 12:47 PM  % Wound base Yellow/Fibrinous Exudate 15% 07/16/2018 12:47 PM  % Wound base Black/Eschar 0% 07/16/2018 12:47 PM  % Wound base Other/Granulation Tissue (Comment) 0% 07/16/2018 12:47 PM  Peri-wound Assessment Intact;Erythema (blanchable) 07/16/2018 12:47 PM  Wound Length (cm) 16.2 cm 07/16/2018 10:31 AM  Wound Width (cm) 15.5 cm 07/16/2018 10:31 AM  Wound Depth (cm) 4.4 cm 07/16/2018 10:31 AM  Wound Surface Area (cm^2) 251.1 cm^2 07/16/2018 10:31 AM  Wound Volume (cm^3) 1104.84 cm^3 07/16/2018 10:31 AM  Tunneling (cm) 7.9 cm in center of wound;  6.6 cm tunnel at 11 o'clock 07/16/2018 12:47 PM  Undermining (cm) 1.7cm from 6-8 o'clock; 5.6cm 10-11o'clock; 3.9cm 11-12 o'clock 07/16/2018 12:47 PM  Margins Unattached edges (unapproximated) 07/16/2018 12:47 PM  Drainage Amount Minimal 07/16/2018 12:47 PM  Drainage Description Serosanguineous 07/16/2018 12:47 PM  Treatment Debridement (Selective);Hydrotherapy (Pulse lavage);Packing (Saline gauze) 07/16/2018 12:47 PM   Santyl applied to wound bed prior to applying dressing.     Hydrotherapy Pulsed lavage therapy - wound location: Buttock Pulsed Lavage with Suction (psi): 12 psi Pulsed Lavage with Suction - Normal Saline Used: 1000 mL Pulsed Lavage Tip: Tip with splash shield Selective Debridement Selective Debridement - Location: Buttock Selective Debridement - Tools Used: Forceps;Scissors Selective Debridement - Tissue Removed: Yellow slough where leading edge is present. Slough is very adherent.     Wound Assessment and Plan  Wound Therapy - Assess/Plan/Recommendations Wound Therapy - Clinical Statement: Wound bed continues to appear improved. Peri-wound area appears less red and blisters appear to be drying up. Noted some of the white/yellow area is nearing bone and may be connective tissue sheath over sacrum. Pt will benefit from continued hydrotherapy for selective removal of unviable tissue, to decrease bioburden and promote wound bed healing. Wound Therapy - Functional Problem List: Decreased tolerance for OOB mobility/position changes Factors Delaying/Impairing Wound Healing: Immobility;Multiple medical problems Hydrotherapy Plan: Debridement;Dressing change;Patient/family education;Pulsatile lavage with suction Wound Therapy - Frequency: 6X / week Wound Therapy - Follow Up Recommendations: Home health RN Wound Plan: See above  Wound Therapy Goals- Improve the function of patient's integumentary system by progressing the wound(s) through the phases of wound healing (inflammation -  proliferation - remodeling) by: Decrease Necrotic  Tissue to: 0% Decrease Necrotic Tissue - Progress: Progressing toward goal Increase Granulation Tissue to: 100% Increase Granulation Tissue - Progress: Progressing toward goal Goals/treatment plan/discharge plan were made with and agreed upon by patient/family: Yes Time For Goal Achievement: 7 days Wound Therapy - Potential for Goals: Good  Goals will be updated until maximal potential achieved or discharge criteria met.  Discharge criteria: when goals achieved, discharge from hospital, MD decision/surgical intervention, no progress towards goals, refusal/missing three consecutive treatments without notification or medical reason.  GP     Thelma Comp 07/16/2018, 2:03 PM   Rolinda Roan, PT, DPT Acute Rehabilitation Services Pager: [phone]

## 2018-07-17 ENCOUNTER — Ambulatory Visit (HOSPITAL_COMMUNITY): Payer: Medicare Other

## 2018-07-17 ENCOUNTER — Inpatient Hospital Stay (HOSPITAL_COMMUNITY): Payer: Medicare Other

## 2018-07-17 ENCOUNTER — Inpatient Hospital Stay (HOSPITAL_COMMUNITY): Payer: Medicare Other | Admitting: Physical Therapy

## 2018-07-17 ENCOUNTER — Inpatient Hospital Stay (HOSPITAL_COMMUNITY): Payer: Medicare Other | Admitting: Occupational Therapy

## 2018-07-17 DIAGNOSIS — M10042 Idiopathic gout, left hand: Secondary | ICD-10-CM

## 2018-07-17 LAB — CBC WITH DIFFERENTIAL/PLATELET
Abs Immature Granulocytes: 0.3 10*3/uL — ABNORMAL HIGH (ref 0.0–0.1)
BASOS ABS: 0.1 10*3/uL (ref 0.0–0.1)
Basophils Relative: 1 %
EOS ABS: 0.3 10*3/uL (ref 0.0–0.7)
EOS PCT: 3 %
HCT: 32.3 % — ABNORMAL LOW (ref 36.0–46.0)
Hemoglobin: 9.8 g/dL — ABNORMAL LOW (ref 12.0–15.0)
IMMATURE GRANULOCYTES: 3 %
Lymphocytes Relative: 26 %
Lymphs Abs: 2.6 10*3/uL (ref 0.7–4.0)
MCH: 29.4 pg (ref 26.0–34.0)
MCHC: 30.3 g/dL (ref 30.0–36.0)
MCV: 97 fL (ref 78.0–100.0)
Monocytes Absolute: 1.3 10*3/uL — ABNORMAL HIGH (ref 0.1–1.0)
Monocytes Relative: 12 %
NEUTROS PCT: 55 %
Neutro Abs: 5.8 10*3/uL (ref 1.7–7.7)
Platelets: 227 10*3/uL (ref 150–400)
RBC: 3.33 MIL/uL — AB (ref 3.87–5.11)
RDW: 15.9 % — AB (ref 11.5–15.5)
WBC: 10.4 10*3/uL (ref 4.0–10.5)

## 2018-07-17 NOTE — Progress Notes (Signed)
Occupational Therapy Session Note  Patient Details  Name: [name] MRN: [id number] Date of Birth: 07/01/1928  Today's Date: 07/17/2018 OT Individual Time: 1300-1400 OT Individual Time Calculation (min): 60 min    Short Term Goals: Week 1:  OT Short Term Goal 1 (Week 1): Pt will thread B LEs into underwear with supervision using AE PRN OT Short Term Goal 2 (Week 1): Pt will complete clothing management in order to access ostomy with steadying assist standing at toilet OT Short Term Goal 3 (Week 1): Pt will complete two grooming tasks in standing with steadying assist in order to increase functional activity tolerance  Skilled Therapeutic Interventions/Progress Updates:    Pt seen for OT session focusing on simple meal prep activity with emphasis on functional activity tolerance and endurance. Pt in supine upon arrival with daughter present assisting with donning of shoes. Pt voicing increased fatigue from previous sessions but willing to participate as able. She ambulated throughout session with close supervision. She required min A for anterior weightshift to come into standing position.  Pt ambulated to ADL apartment. Following seated rest break, pt completed simple meal prep activity making scrambled egg at stove level. Pt tolerating ~10 minutes of dynamic standing and ambulation throughout kitchen to obtain items and prepare meal with supervision. Seated rest break required following cooking task before beginning cleaning up standing at sink with heavy reliance on forearms for balance and support. During seated rest break, pt and daughter educated regarding kitchen mobility techniques, safety during cooking tasks, energy conservation and importance of participation with ADLs and reducing caregiver burden.  Pt ambulated back to room at end of session, using weighted grocery cart as AD with close supervision. Pt requiring x2 standing rest breaks before entering room. Pt transitioned to sitting  EOB, left seated with all needs in reach and family present to assist. Rn made aware of pt's increasing sacral pain.   Therapy Documentation Precautions:  Precautions Precautions: Fall Precaution Comments: colostomy, suprapubic catheter, large buttock wound Required Braces or Orthoses: Other Brace/Splint Other Brace/Splint: R ankle brace, shoes when OOB per family request Restrictions Weight Bearing Restrictions: No  See Function Navigator for Current Functional Status.   Therapy/Group: Individual Therapy  Javien Tesch L 07/17/2018, 7:10 AM

## 2018-07-17 NOTE — Progress Notes (Signed)
Subjective/Complaints: Bed edge of her bed this morning but well overnight after the car returned. She feels she is getting stronger.  ROS: Denies CP, SOB, N/V/D  Objective: Vital Signs: Blood pressure 132/69, pulse 67, temperature (!) 97.5 F (36.4 C), temperature source Oral, resp. rate 15, height 5' 4"  (1.626 m), weight 97.7 kg, SpO2 91 %. No results found. Results for orders placed or performed during the hospital encounter of 07/11/18 (from the past 72 hour(s))  Basic metabolic panel     Status: Abnormal   Collection Time: 07/16/18  7:22 AM  Result Value Ref Range   Sodium 143 135 - 145 mmol/L   Potassium 4.3 3.5 - 5.1 mmol/L   Chloride 104 98 - 111 mmol/L   CO2 31 22 - 32 mmol/L   Glucose, Bld 86 70 - 99 mg/dL   BUN 31 (H) 8 - 23 mg/dL   Creatinine, Ser 1.18 (H) 0.44 - 1.00 mg/dL   Calcium 8.5 (L) 8.9 - 10.3 mg/dL   GFR calc non Af Amer 40 (L) >60 mL/min   GFR calc Af Amer 46 (L) >60 mL/min    Comment: (NOTE) The eGFR has been calculated using the CKD EPI equation. This calculation has not been validated in all clinical situations. eGFR's persistently <60 mL/min signify possible Chronic Kidney Disease.    Anion gap 8 5 - 15    Comment: Performed at [facility] [address], [place], [place] [address]     Constitutional: No distress . Vital signs reviewed. HENT: Normocephalic.  Atraumatic. Eyes: EOMI. No discharge. Cardiovascular: RRR. No JVD. Respiratory: CTA bilaterally. Normal effort. GI: BS +. Non-distended. GU: suprapubic foley in place Musc: No edema or tenderness in extremities. Neuro:  Alert/Oriented Motor: Bilateral upper extremities: 4+/5 proximal distal Right lower extremity: Hip flexion 3-/5, knee extension 4 -/5, ankle dorsiflexion 4+/5 Left lower extremity: Hip flexion: 3 --3/5, knee extension 4 -/5, ankle dorsiflexion 4+/5 Skin:   Wound large stage 4 left sacral with W->Dry dressing, not examined today Psych:  Slowed  Assessment/Plan: 1. Functional deficits secondary to Debilitysecondary to sepsis fromFourniergangrene. Status post debridement of left gluteal necrotizing soft tissue infection 06/27/2018 followed by debridement of large sacral perineal wound 06/29/2018, status post diverting colostomy placement and status post suprapubic placement 07/03/2018 which require 3+ hours per day of interdisciplinary therapy in a comprehensive inpatient rehab setting. Physiatrist is providing close team supervision and 24 hour management of active medical problems listed below. Physiatrist and rehab team continue to assess barriers to discharge/monitor patient progress toward functional and medical goals. FIM: Function - Bathing Position: Wheelchair/chair at sink Body parts bathed by patient: Right arm, Left arm, Chest, Abdomen, Right upper leg, Left upper leg Body parts bathed by helper: Front perineal area, Buttocks, Right lower leg, Left lower leg, Back  Function- Upper Body Dressing/Undressing What is the patient wearing?: Hospital gown Pull over shirt/dress - Perfomed by patient: Thread/unthread right sleeve, Thread/unthread left sleeve, Put head through opening Pull over shirt/dress - Perfomed by helper: Pull shirt over trunk Function - Lower Body Dressing/Undressing What is the patient wearing?: Underwear, Shoes Position: Sitting EOB Underwear - Performed by patient: Thread/unthread right underwear leg, Thread/unthread left underwear leg, Pull underwear up/down Underwear - Performed by helper: Thread/unthread right underwear leg, Thread/unthread left underwear leg, Pull underwear up/down Shoes - Performed by patient: Don/doff right shoe, Don/doff left shoe Shoes - Performed by helper: Fasten right, Fasten left Assist for footwear: Supervision/touching assist Assist for lower body dressing: Touching  or steadying assistance (Pt > 75%)  Function - Toileting Toileting activity did not occur:  N/A Toileting steps completed by patient: Adjust clothing prior to toileting, Performs perineal hygiene, Adjust clothing after toileting Assist level: Touching or steadying assistance (Pt.75%)(Pt managing ostomy bag from standing position)  Function - Toilet Transfers Toilet transfer activity did not occur: N/A Toilet transfer assistive device: Elevated toilet seat/BSC over toilet Assist level to toilet: Touching or steadying assistance (Pt > 75%) Assist level from toilet: Maximal assist (Pt 25 - 49%/lift and lower)  Function - Chair/bed transfer Chair/bed transfer method: Stand pivot Chair/bed transfer assist level: Touching or steadying assistance (Pt > 75%) Chair/bed transfer assistive device: Armrests, Walker, Orthosis Chair/bed transfer details: Verbal cues for technique, Verbal cues for precautions/safety, Verbal cues for safe use of DME/AE, Tactile cues for posture, Tactile cues for sequencing, Tactile cues for initiation  Function - Locomotion: Wheelchair Assist Level: Dependent (Pt equals 0%)(for time management) Function - Locomotion: Ambulation Assistive device: Walker-rolling Max distance: 125 Assist level: Touching or steadying assistance (Pt > 75%) Assist level: Touching or steadying assistance (Pt > 75%) Walk 50 feet with 2 turns activity did not occur: Safety/medical concerns Assist level: Touching or steadying assistance (Pt > 75%) Walk 150 feet activity did not occur: Safety/medical concerns Walk 10 feet on uneven surfaces activity did not occur: Safety/medical concerns  Function - Comprehension Comprehension: Auditory Comprehension assist level: Follows complex conversation/direction with extra time/assistive device  Function - Expression Expression: Verbal Expression assist level: Expresses complex ideas: With no assist  Function - Social Interaction Social Interaction assist level: Interacts appropriately with others with medication or extra time  (anti-anxiety, antidepressant).  Function - Problem Solving Problem solving assist level: Solves basic 90% of the time/requires cueing < 10% of the time  Function - Memory Memory assist level: Recognizes or recalls 90% of the time/requires cueing < 10% of the time Patient normally able to recall (first 3 days only): Current season, That he or she is in a hospital  Medical Problem List and Plan: 1.Debilitysecondary to sepsis fromFourniergangrene. Status post debridement of left gluteal necrotizing soft tissue infection 06/27/2018 followed by debridement of large sacral perineal wound 06/29/2018, status post diverting colostomy placement and status post suprapubic placement 07/03/2018.  Cont CIR  2. DVT Prophylaxis/Anticoagulation: Subcutaneous Lovenox.   Dopplers neg for DVT 3. Pain Management:Ultram as needed 4. Mood:Zoloft 50 mg daily, also on chronic low dose xanax at night, per daughter not sleeping well will resumed  5. Neuropsych: This patientiscapable of making decisions on herown behalf. 6. Skin/Wound Care:Skin care as directed 7. Fluids/Electrolytes/Nutrition:Routine in and outs  8.ID: Augmentin completed 9.Acute on chronic grade 2 diastolic dysfunction. Monitor for any signs of fluid overload. Continue Lasix  Filed Weights   07/15/18 0337 07/16/18 0346 07/17/18 0700  Weight: 90 kg 90.1 kg 97.7 kg   ? Reliability 10.AKI.   Creatinine 1.18 on 8/19  Encourage fluids 11.Hypothyroidism. Synthroid 12.Morbid obesity. BMI 40.58. Dietary follow-up 13.OSA-CPAP 14.  ABLA   Hemoglobin 9.8 on 8/20, likely concentrated 15.  Hx gout Left thumb MCP with flare up , improved with medrol dose pack, resumed allopurinol   Xray reviewed - showing CMC arthritis  Continue to monitor  LOS (Days) 6 A FACE TO FACE EVALUATION WAS PERFORMED  Mattthew Ziomek Lorie Phenix 07/17/2018, 8:32 AM

## 2018-07-17 NOTE — Progress Notes (Signed)
Pt has home unit and places self on and off with family assistance.  Pt aware to call if assistance is needed. Rt will monitor.

## 2018-07-17 NOTE — Progress Notes (Signed)
Physical Therapy Session Note  Patient Details  Name: [name] MRN: [id number] Date of Birth: 07/26/1928  Today's Date: 07/17/2018 PT Individual Time: 0900-1000 PT Individual Time Calculation (min): 60 min   Short Term Goals: Week 1:  PT Short Term Goal 1 (Week 1): Pt will perform bed mobility modA PT Short Term Goal 2 (Week 1): Pt will perform sit <>stand modA +1 PT Short Term Goal 3 (Week 1): Pt will perform gait with LRAD x50' minA PT Short Term Goal 4 (Week 1): Pt will ascend/descent 2 steps modA  Skilled Therapeutic Interventions/Progress Updates: Pt received seated on EOB; denies pain having recently taken pain medication, and agreeable to treatment. Gait with RW to gym min guard, improving speed and step length overall. Pt very short of breath following gait trial; O2 during rest break 98% HR 85bpm; requires several minutes to recover and slow RR. Standing alternating toe taps to 3" step min guard with BUE support on RW 3 sets 20 reps each for focus on dynamic balance, LE strengthening in SLS, aerobic endurance. Standing balance on airex foam pad performing dynamic UE reaching to retrieve and throw horseshoes to facilitate ankle strategy as well as standing endurance/activity tolerance; one UE on RW while reaching, min guard for balance and step up/down onto airex. Seated UE shuttle pass 2 x2 min for UE strengthening and endurance. Stand pivot transfer mat>w/c> bed min guard with RW. Sit >supine minA, rolling with S onto R side to prep for hydrotherapy; daughter present and all needs in reach.      Therapy Documentation Precautions:  Precautions Precautions: Fall Precaution Comments: colostomy, suprapubic catheter, large buttock wound Required Braces or Orthoses: Other Brace/Splint Other Brace/Splint: R ankle brace, shoes when OOB per family request Restrictions Weight Bearing Restrictions: No Pain: Pain Assessment Pain Scale: 0-10 Pain Score: 8  Pain Location:  Buttocks Pain Orientation: Right;Medial Pain Descriptors / Indicators: Discomfort;Aching Pain Frequency: Constant Pain Onset: On-going Pain Intervention(s): Medication (See eMAR)   See Function Navigator for Current Functional Status.   Therapy/Group: Individual Therapy  Harlon Dittylizabeth J Chancie Lampert 07/17/2018, 9:58 AM

## 2018-07-17 NOTE — Progress Notes (Signed)
Occupational Therapy Session Note  Patient Details  Name: Mickie KayGracie B Thew MRN: [id number] Date of Birth: 06/07/1928  Today's Date: 07/17/2018 OT Individual Time: 1100-1155 OT Individual Time Calculation (min): 55 min    Short Term Goals: Week 1:  OT Short Term Goal 1 (Week 1): Pt will thread B LEs into underwear with supervision using AE PRN OT Short Term Goal 2 (Week 1): Pt will complete clothing management in order to access ostomy with steadying assist standing at toilet OT Short Term Goal 3 (Week 1): Pt will complete two grooming tasks in standing with steadying assist in order to increase functional activity tolerance  Skilled Therapeutic Interventions/Progress Updates:    OT intervention with focus on bed mobility, functional amb with RW, standing balance, activity tolerance, and safety awareness.  Pt resting in bed upon arrival with daughter present.  Pt sat EOB with supervision in preparation for amb to therapy gym with CGA.  Pt's daughter followed with w/c.  Pt stopped to rest in w/c at nursing station before continuing to gym.  Pt engaged in standing activities at table including folding towels and engaging in game of checkers.  After folding towels, pt amb with RW to linen bag to place towels in bag.  Pt stood X 12 mins while playing checkers.  Pt amb back to nursing station and daughter transported pt in w/c to room for energy conservation.  Pt transferred to EOB.  All tasks completed with close supervision.  Pt remained seated EOB with daughter present and all needs within reach.   Therapy Documentation Precautions:  Precautions Precautions: Fall Precaution Comments: colostomy, suprapubic catheter, large buttock wound Required Braces or Orthoses: Other Brace/Splint Other Brace/Splint: R ankle brace, shoes when OOB per family request Restrictions Weight Bearing Restrictions: No  Pain: Pain Assessment Pain Scale: 0-10 Pain Score: 7  Pain Location: Buttocks Pain Orientation:  Right;Medial Pain Descriptors / Indicators: Discomfort;Aching Pain Frequency: Constant Pain Onset: On-going Pain Intervention(s): Medication (See eMAR)  See Function Navigator for Current Functional Status.   Therapy/Group: Individual Therapy  Rich BraveLanier, Maile Linford Chappell 07/17/2018, 11:56 AM

## 2018-07-17 NOTE — Progress Notes (Signed)
Physical Therapy Wound Treatment Patient Details  Name: [name] MRN: [id number] Date of Birth: 1928-10-09  Today's Date: 07/17/2018 PT Individual Time: 1000-1057 PT Individual Time Calculation (min): 57 min   Subjective  Subjective: Pt pleasant and agreeable to hydrotherapy.  Patient and Family Stated Goals: Per daughter, learn to pack wound to prep for d/c Date of Onset: (unsure) Prior Treatments: Surgical debridement 06/27/18 and 06/29/18. On 8/6 pt underwent diverting colostomy and SP catheter placement.  Pain Score: Pt premedicated; reports minimal pain throughout session  Wound Assessment  Pressure Injury 06/27/18 Unstageable - Full thickness tissue loss in which the base of the ulcer is covered by slough (yellow, tan, gray, green or brown) and/or eschar (tan, brown or black) in the wound bed. Three areas of black eschar surrounded by blan (Active)  Wound Image   07/16/2018 12:47 PM  Dressing Type ABD;Gauze (Comment);Moist to dry;Paper tape;Barrier Film (skin prep) 07/17/2018 12:12 PM  Dressing Changed 07/17/2018 12:12 PM  Dressing Change Frequency Daily 07/17/2018 12:12 PM  State of Healing Early/partial granulation 07/17/2018 12:12 PM  Site / Wound Assessment Red;Yellow 07/17/2018 12:12 PM  % Wound base Red or Granulating 90% 07/17/2018 12:12 PM  % Wound base Yellow/Fibrinous Exudate 10% 07/17/2018 12:12 PM  % Wound base Black/Eschar 0% 07/17/2018 12:12 PM  % Wound base Other/Granulation Tissue (Comment) 0% 07/17/2018 12:12 PM  Peri-wound Assessment Intact;Erythema (blanchable) 07/17/2018 12:12 PM  Wound Length (cm) 16.2 cm 07/16/2018 10:31 AM  Wound Width (cm) 15.5 cm 07/16/2018 10:31 AM  Wound Depth (cm) 4.4 cm 07/16/2018 10:31 AM  Wound Surface Area (cm^2) 251.1 cm^2 07/16/2018 10:31 AM  Wound Volume (cm^3) 1104.84 cm^3 07/16/2018 10:31 AM  Tunneling (cm) 7.9 cm in center of wound; 6.6 cm tunnel at 11 o'clock 07/16/2018 12:47 PM  Undermining (cm) 1.7cm from 6-8 o'clock; 5.6cm  10-11o'clock; 3.9cm 11-12 o'clock 07/16/2018 12:47 PM  Margins Unattached edges (unapproximated) 07/17/2018 12:12 PM  Drainage Amount Minimal 07/17/2018 12:12 PM  Drainage Description Serosanguineous 07/17/2018 12:12 PM  Treatment Debridement (Selective);Hydrotherapy (Pulse lavage);Packing (Saline gauze) 07/17/2018 12:12 PM   Santyl applied to wound bed prior to applying dressing.    Hydrotherapy Pulsed lavage therapy - wound location: Buttock Pulsed Lavage with Suction (psi): 12 psi Pulsed Lavage with Suction - Normal Saline Used: 1000 mL Pulsed Lavage Tip: Tip with splash shield Selective Debridement Selective Debridement - Location: Buttock Selective Debridement - Tools Used: Forceps;Scissors Selective Debridement - Tissue Removed: Yellow slough where leading edge is present. Slough is very adherent.     Wound Assessment and Plan  Wound Therapy - Assess/Plan/Recommendations Wound Therapy - Clinical Statement: Noted the gauze removed from both tunnels appeared blue at the ends. No odor associated with pseudomonas however. RN made aware. Daughter present for education on packing wound in preparation for d/c. Pt will benefit from continued hydrotherapy for selective removal of unviable tissue, to decrease bioburden and promote wound bed healing. Wound Therapy - Functional Problem List: Decreased tolerance for OOB mobility/position changes Factors Delaying/Impairing Wound Healing: Immobility;Multiple medical problems Hydrotherapy Plan: Debridement;Dressing change;Patient/family education;Pulsatile lavage with suction Wound Therapy - Frequency: 6X / week Wound Therapy - Follow Up Recommendations: Home health RN Wound Plan: See above  Wound Therapy Goals- Improve the function of patient's integumentary system by progressing the wound(s) through the phases of wound healing (inflammation - proliferation - remodeling) by: Decrease Necrotic Tissue to: 0% Decrease Necrotic Tissue - Progress:  Progressing toward goal Increase Granulation Tissue to: 100% Increase Granulation Tissue - Progress: Progressing toward  goal Goals/treatment plan/discharge plan were made with and agreed upon by patient/family: Yes Time For Goal Achievement: 7 days Wound Therapy - Potential for Goals: Good  Goals will be updated until maximal potential achieved or discharge criteria met.  Discharge criteria: when goals achieved, discharge from hospital, MD decision/surgical intervention, no progress towards goals, refusal/missing three consecutive treatments without notification or medical reason.  GP     Thelma Comp 07/17/2018, 12:21 PM   Rolinda Roan, PT, DPT Acute Rehabilitation Services Pager: [phone]

## 2018-07-17 NOTE — Consult Note (Signed)
WOC Nurse ostomy consult note Stoma type/location: LLQ colostomy.  Daughter at bedside.  She changed pouch independently 07/16/18.  She feels confident about the process.  We will see once more 07/19/18 for teaching and pouch change prior to discharge.  Stomal assessment/size: Oval:  1 " x 1.5 " pink and moist Peristomal assessment: not assessed Treatment options for stomal/peristomal skin: barrier ring and convex pouch Output soft brown stool Ostomy pouching: 1pc. convex  Education provided: Discussed that I will order supplies for discharge.  She indicates that the secure start package has arrived and she has not opened.  Enrolled patient in [employer] Secure Start DC program: Yes Will not follow at this time.  Please re-consult if needed.  Maple HudsonKaren Jourdan Durbin RN BSN CWON Pager [phone]

## 2018-07-17 NOTE — Progress Notes (Signed)
Social Work Patient ID: [name], female   DOB: Oct 25, 1928, 84 y.o.   MRN: [id number]  Met with pt and daughter-Cindy to discuss equipment and follow up therapies. Daughter would like to use [facility] due to a family member works there. Have faxed over referral for equipment. She had no preference for home health have made referral to [facility] for follow up wound care and PT services. Daughter to take down bed at her home to prepare for the hospital bed.

## 2018-07-18 ENCOUNTER — Inpatient Hospital Stay (HOSPITAL_COMMUNITY): Payer: Medicare Other | Admitting: Occupational Therapy

## 2018-07-18 ENCOUNTER — Ambulatory Visit (HOSPITAL_COMMUNITY): Payer: Medicare Other

## 2018-07-18 ENCOUNTER — Inpatient Hospital Stay (HOSPITAL_COMMUNITY): Payer: Medicare Other | Admitting: Physical Therapy

## 2018-07-18 LAB — CREATININE, SERUM
Creatinine, Ser: 1.38 mg/dL — ABNORMAL HIGH (ref 0.44–1.00)
GFR calc non Af Amer: 33 mL/min — ABNORMAL LOW (ref >60)
GFR, EST AFRICAN AMERICAN: 38 mL/min — AB (ref >60)

## 2018-07-18 NOTE — Discharge Summary (Signed)
NAME: [name], [name] MEDICAL RECORD [id number] ACCOUNT [id number] DATE OF BIRTH:1928/06/21 FACILITY: [facility]ATION: [facility] PHYSICIAN:ANKIT PATEL, MD  DISCHARGE SUMMARY  DATE OF DISCHARGE:  07/19/2018  ADMIT DATE:  07/11/2018  DISCHARGE DATE:  07/19/2018  DISCHARGE DIAGNOSES: 1.  Debilitation secondary to sepsis from Fournier's gangrene, status post debridement of left gluteal necrotizing soft tissue infection followed by and debridement of large sacral perineal wound 06/29/2018.  2.  Status post diverting colostomy placement and status post suprapubic placement 07/03/2018.  3.  Subcutaneous Lovenox for deep venous thrombosis prophylaxis. 4.  Pain management. 5.  Acute on chronic grade II diastolic dysfunction.   6.  Acute kidney injury. 7.  Hypothyroidism. 8.  Morbid obesity. 9.  Obstructive sleep apnea.   10.  Acute blood loss anemia.   11.  History of gout.    This is an 84 year old right-handed female with history of obstructive sleep apnea, morbid obesity.  [place]  Used a cane prior to admission.  Multiple family members with good support.  She presented on 06/26/2018 with  diarrhea, black tarry stool and abdominal pain as well as severe hypotension.  Noted poor appetite over the past few days.  White blood cell count 16,000, creatinine 5.6, hemoglobin 10.7 with fecal occult blood positive.  Lactic acid 2.96.   Echocardiogram with ejection fraction of 55%, grade II diastolic dysfunction.  CT of abdomen and pelvis showed focal hazy inflammatory stranding adjacent to a loop of small bowel within the left abdomen suspicious for acute enteritis.  No obstruction.   Sigmoid diverticulosis without evidence for acute diverticulitis.  A 7 mm nodule left lung base, increased in prominence as compared to 2012.  Wound care nurse followup for 3 dark and necrotic areas present on the left buttock with surrounding erythema  consistent with cellulitis extending  all the way to the left labia.  Underwent excisional debridement of left gluteal necrotizing soft tissue infection on 06/27/2018, per Dr. Cliffton AstersWhite followed by debridement of large sacral perineal and sacral wound  06/29/2018.  Urology consulted on 07/01/2018 due to ongoing urinary incontinence, slowly progressed and later underwent suprapubic tube placement 07/03/2018 per Dr. Alvester MorinBell as well as laparoscopic diverting loop colostomy by Dr. Derrell Lollingamirez.  There were some  issues of leaking around the suprapubic tube and monitored.  Nephrology service is consulted in regards to severe AKI felt to be related to hypotension responding to IV fluids.  No other current workup needed.  Urine culture showed Enterobacter sensitive  to Zosyn and later changed to Augmentin x10 day course.  She was receiving hydrotherapy for significant sacral wound.  Subcutaneous Lovenox for DVT prophylaxis.  The patient was admitted for comprehensive rehabilitation program.  PAST MEDICAL HISTORY:  See discharge diagnoses.  SOCIAL HISTORY:  Lives with grandson.  Used a cane prior to admission with good family support.  FUNCTIONAL STATUS:  Upon admission to rehab services was +2 physical assist sit to stand, minimal assist 25 feet rolling walker, minimal assist upper body, total assist lower body ADLs.  PHYSICAL EXAMINATION: VITAL SIGNS:  Blood pressure 125/61, pulse 79, temperature 97, respirations 18. GENERAL:  Alert female in no acute distress. HEENT:  EOMs intact. NECK:  Supple, nontender, no JVD. CARDIOVASCULAR:  Rate controlled. ABDOMEN:  Soft, nontender, good bowel sounds.  Colostomy in place.  Suprapubic tube in place.  Noted large sacral wound dressed.  REHABILITATION HOSPITAL COURSE:  The patient was admitted to inpatient rehabilitation services.  Therapies initiated on a 3-hour daily  basis, consisting of physical therapy, occupational therapy and rehabilitation nursing.  The following issues were  addressed during patient's  rehabilitation stay.  Pertaining to the patient's debilitation related to sepsis from Fournier's gangrene, she had undergone extensive debridement of left gluteal necrotizing soft tissue infection as well as debridement of  large sacral perineal wound with need for colostomy tube and suprapubic placement.  Wound care nurse followup for necessary skin care education in regards to colostomy, suprapubic tube.  Subcutaneous Lovenox for DVT prophylaxis.  Venous Doppler studies  negative.  Pain management with the use of Ultram.  She continued on Zoloft as well as chronic Xanax at night for mood stabilization.  She completed a course of Augmentin for cellulitis remaining afebrile.  She exhibited no other signs of fluid overload.   She continued on Lasix 40 mg daily.  Acute kidney injury had improved greatly.  Latest creatinine 1.38.  Morbid obesity, BMI of 40.58.  Dietary followup.  She did have a history of gout to the left thumb MCP improved with a Medrol Dosepak resumed  allopurinol.  The patient again with wound care nurse followup for her skin care as directed.  Home health nurse had been arranged.  She had received hydrotherapy.  The patient received weekly collaborative interdisciplinary team conferences to discuss  estimated length of stay, family teaching, any barriers to her discharge.  Ambulates with rolling walker to the gymnasium minimal guard.  Working with energy conservation techniques.  Standing alternating toe taps 3 inches minimal assist.  Strength and  endurance continued to improve and stand pivot transfers, minimal guard.  Gather belongings for activities of daily living and homemaking needing some assistance for lower body dressing.  She was discharged to home with excellent family support.  DISCHARGE MEDICATIONS:  Included allopurinol 100 mg p.o. daily, Xanax 0.25 mg at bedtime, Lasix 40 mg daily, Synthroid 137 mcg daily, Zoloft 50 mg p.o. daily, Ultram 50 mg every 6 hours as needed for  pain.  DIET:  Her diet was regular.    FOLLOWUP:  She would follow up with Dr. Maryla MorrowAnkit Patel at the outpatient rehab service office as directed; Dr. Marin Olphristopher White general surgery, call for appointment; Dr. Modena SlaterEugene Bell urology services.  SPECIAL INSTRUCTIONS:  Gently cleanse around suprapubic catheter site.  Pat dry.  Apply Desitin around tube site.  Place a split gauze dressing around the site.  Change of dressing if it becomes soiled prior to schedule changes.  Twice daily wet to dry  dressing left buttock change needed using paper tape.  TN/NUANCE D:07/18/2018 T:07/18/2018 JOB:[id number]

## 2018-07-18 NOTE — Progress Notes (Signed)
Pt using CPAP machine from home- family at bedside- no issues noted at time of check

## 2018-07-18 NOTE — Progress Notes (Signed)
Leg bags taken to room and will be changed over prior to d/c.  Family changed dressing to suprapubic cath and emptied colostomy prior to leaving.  Other family at bedside.  Pt resting in bed with SR up x 3, call light in reach. Will continue to monitor.

## 2018-07-18 NOTE — Progress Notes (Signed)
Physical Therapy Discharge Summary  Patient Details  Name: [name] MRN: [id number] Date of Birth: 1928-07-22  Today's Date: 07/18/2018 PT Individual Time: 1287-8676 and 1430-1530  PT Individual Time Calculation (min): 45 min and 60 min (total 105 min)   Patient has met 8 of 8 long term goals due to improved activity tolerance, improved balance, improved postural control and increased strength.  Patient to discharge at an ambulatory level Supervision, minA for stairs for home entry. Patient's care partner is independent to provide the necessary physical assistance at discharge.  Reasons goals not met: All goals met  Recommendation:  Patient will benefit from ongoing skilled PT services in home health setting to continue to advance safe functional mobility, address ongoing impairments in strength, balance, activity tolerance, and minimize fall risk.  Equipment: w/c, hospital bed  Reasons for discharge: treatment goals met and discharge from hospital  Patient/family agrees with progress made and goals achieved: Yes  PT Discharge Precautions/Restrictions Precautions Precautions: Fall Precaution Comments: colostomy, suprapubic catheter, large buttock wound Required Braces or Orthoses: Other Brace/Splint Other Brace/Splint: R ankle brace, shoes when OOB per family request Restrictions Weight Bearing Restrictions: No Pain  Denies pain Vision/Perception  Perception Perception: Within Functional Limits Praxis Praxis: Intact  Cognition Overall Cognitive Status: Within Functional Limits for tasks assessed Arousal/Alertness: Awake/alert Orientation Level: Oriented X4 Memory: Appears intact Awareness: Appears intact Problem Solving: Appears intact Safety/Judgment: Appears intact Sensation  WFL Motor  Motor Motor: Within Functional Limits Motor - Discharge Observations: Generalized weakness/deconditioning  Mobility Bed Mobility Bed Mobility: Rolling Right;Rolling  Left Rolling Right: Supervision/verbal cueing Rolling Left: Supervision/Verbal cueing Supine to Sit: Supervision/Verbal cueing Sit to Supine: Supervision/Verbal cueing Transfers Transfers: Sit to Stand;Stand Pivot Transfers Sit to Stand: Supervision/Verbal cueing Stand Pivot Transfers: Supervision/Verbal cueing Stand Pivot Transfer Details: Verbal cues for technique;Verbal cues for precautions/safety Transfer (Assistive device): Rolling walker Locomotion  Gait Ambulation: Yes Gait Assistance: Supervision/Verbal cueing Gait Distance (Feet): 150 Feet Assistive device: Rolling walker Gait Assistance Details: Verbal cues for technique;Verbal cues for precautions/safety Gait Gait: Yes Gait Pattern: Impaired Gait Pattern: Poor foot clearance - right;Poor foot clearance - left;Decreased stride length;Decreased trunk rotation;Lateral hip instability Gait velocity: 1.42 ft/sec Stairs / Additional Locomotion Stairs: Yes Stairs Assistance: Contact Guard/Touching assist Stair Management Technique: One rail Right;Step to pattern;Forwards Number of Stairs: 2 Height of Stairs: 6 Ramp: Supervision/Verbal cueing Wheelchair Mobility Wheelchair Mobility: No  Trunk/Postural Assessment  Cervical Assessment Cervical Assessment: Exceptions to WFL(Forward head) Thoracic Assessment Thoracic Assessment: Exceptions to WFL(Rounded shoulders; kyphotic) Lumbar Assessment Lumbar Assessment: Exceptions to WFL(Posterior pelvic tilt) Postural Control Postural Control: Within Functional Limits  Balance Balance Balance Assessed: Yes Standardized Balance Assessment Standardized Balance Assessment: Timed Up and Go Test;Berg Balance Test Berg Balance Test Sit to Stand: Able to stand using hands after several tries Standing Unsupported: Able to stand 2 minutes with supervision Sitting with Back Unsupported but Feet Supported on Floor or Stool: Able to sit safely and securely 2 minutes Stand to Sit: Uses  backs of legs against chair to control descent Transfers: Able to transfer with verbal cueing and /or supervision Standing Unsupported with Eyes Closed: Able to stand 3 seconds Standing Ubsupported with Feet Together: Able to place feet together independently but unable to hold for 30 seconds From Standing, Reach Forward with Outstretched Arm: Reaches forward but needs supervision From Standing Position, Pick up Object from Floor: Able to pick up shoe, needs supervision From Standing Position, Turn to Look Behind Over each Shoulder: Needs supervision when turning  Turn 360 Degrees: Needs assistance while turning Standing Unsupported, Alternately Place Feet on Step/Stool: Needs assistance to keep from falling or unable to try Standing Unsupported, One Foot in Front: Needs help to step but can hold 15 seconds Standing on One Leg: Tries to lift leg/unable to hold 3 seconds but remains standing independently Total Score: 24 Timed Up and Go Test TUG: Normal TUG Normal TUG (seconds): 37 Static Sitting Balance Static Sitting - Balance Support: Feet supported;No upper extremity supported Static Sitting - Level of Assistance: 7: Independent Dynamic Sitting Balance Dynamic Sitting - Balance Support: Feet supported;During functional activity Dynamic Sitting - Level of Assistance: 7: Independent Sitting balance - Comments: Seated EOB Static Standing Balance Static Standing - Balance Support: During functional activity;Right upper extremity supported;Left upper extremity supported Static Standing - Level of Assistance: 6: Modified independent (Device/Increase time) Static Standing - Comment/# of Minutes: Standing at sink to complete ADL tasks Dynamic Standing Balance Dynamic Standing - Balance Support: During functional activity;No upper extremity supported Dynamic Standing - Level of Assistance: 5: Stand by assistance Dynamic Standing - Balance Activities: Lateral lean/weight shifting;Forward  lean/weight shifting Dynamic Standing - Comments: Standing to complete LB dressing tasks Extremity Assessment  RUE Assessment RUE Assessment: Within Functional Limits LUE Assessment LUE Assessment: Within Functional Limits RLE Assessment RLE Assessment: Exceptions to [facility] Passive Range of Motion (PROM) Comments: grossly WFL for functional tasks General Strength Comments: hip flexion 4-/5, knee flexion 4-/5, knee extension 4/5, ankle dorsiflexion/plantarflexion 4+/5 LLE Assessment Passive Range of Motion (PROM) Comments: grossly WFL for functional tasks General Strength Comments: hip flexion 4-/5, knee flexion 4-/5, knee extension 4/5, ankle dorsiflexion/plantarflexion 4+/5  Skilled Therapeutic Intervention: Tx 1: Pt received in bed; pt and family report she had just gotten back in bed after being up most of the morning, requests to stay in bed at this time. Reviewed bed level LE strengthening HEP including sidelying clamshells, hip adduction isometric, straight leg raise. Provided handouts. Communicated with CSW regarding family confusion regarding equipment; CSW to follow up. Reviewed roho setting/checking and care with pt and daughters. Demonstrated on pt's daughter including technique for assessing immersion depth, identifying lowest bony prominence. Pt remained in bed at end of session, all needs in reach.  Tx 2: Pt received seated on EOB, denies pain and agreeable to treatment. Assessed mobility as above with S overall; min guard for stairs. Reviewed recommendations for S and use of RW with gait d/t increased risk for falls. Returned to room totalA; remained seated in w/c at end of session, all needs in reach.    See Function Navigator for Current Functional Status.  Benjiman [facility] 07/18/2018, 9:43 AM

## 2018-07-18 NOTE — Progress Notes (Signed)
   07/18/18 1600  Subjective Assessment  Subjective Pt pleasant and agreeable to hydrotherapy.   Date of Onset  (unsure)  Prior Treatments Surgical debridement 06/27/18 and 06/29/18. On 8/6 pt underwent diverting colostomy and SP catheter placement.  Evaluation and Treatment  Evaluation and Treatment Procedures Explained to Patient/Family Yes  Evaluation and Treatment Procedures agreed to  [REMOVED] Pressure Injury 06/27/18 Unstageable - Full thickness tissue loss in which the base of the ulcer is covered by slough (yellow, tan, gray, green or brown) and/or eschar (tan, brown or black) in the wound bed. Three areas of black eschar surround  Final Assessment Date/Final Assessment Time: 07/18/18 0810  Date First Assessed/Time First Assessed: 06/27/18 0300   Location: Buttocks  Location Orientation: Mid  Staging: Unstageable - Full thickness tissue loss in which the base of the ulcer is cov...  Dressing Type ABD;Gauze (Comment) (wet to dry, santyl ointment)  Dressing Changed  Dressing Change Frequency Twice a day  State of Healing Early/partial granulation  Site / Wound Assessment Red;Yellow  % Wound base Red or Granulating 90%  % Wound base Yellow/Fibrinous Exudate 10%  % Wound base Black/Eschar 0%  % Wound base Other/Granulation Tissue (Comment) 0%  Peri-wound Assessment Intact;Erythema (blanchable)  Margins Unattached edges (unapproximated)  Drainage Amount Minimal  Drainage Description Serosanguineous  Treatment Debridement (Selective);Hydrotherapy (Pulse lavage);Packing (Saline gauze) (santyl applied to necrotic tissue)  Hydrotherapy  Pulsed lavage therapy - wound location Buttock  Pulsed Lavage with Suction (psi) 12 psi  Pulsed Lavage with Suction - Normal Saline Used 1000 mL  Pulsed Lavage Tip Tip with splash shield  Selective Debridement  Selective Debridement - Location Buttock  Selective Debridement - Tools Used Forceps;Scissors  Selective Debridement - Tissue Removed Yellow  slough where leading edge is present. Slough is very adherent.    Wound Therapy - Assess/Plan/Recommendations  Wound Therapy - Clinical Statement No green blue tint appearance to gauze in either tunnel. Pt will benefit from hydrotherapy to reduce bioburden and improve wound healing.    Wound Therapy - Functional Problem List Decreased tolerance for OOB mobility/position changes  Factors Delaying/Impairing Wound Healing Immobility;Multiple medical problems  Hydrotherapy Plan Debridement;Dressing change;Patient/family education;Pulsatile lavage with suction  Wound Therapy - Frequency 6X / week  Wound Therapy - Follow Up Recommendations Home health RN  Wound Plan See above  Wound Therapy Goals - Improve the function of patient's integumentary system by progressing the wound(s) through the phases of wound healing by:  Decrease Necrotic Tissue to 0%  Decrease Necrotic Tissue - Progress Progressing toward goal  Increase Granulation Tissue to 100%  Increase Granulation Tissue - Progress Progressing toward goal  Goals/treatment plan/discharge plan were made with and agreed upon by patient/family Yes  Time For Goal Achievement 7 days  Wound Therapy - Potential for Goals Good   Arthella Headings B. Beverely RisenVan Fleet PT, DPT Acute Rehabilitation  [phone] Pager [phone]

## 2018-07-18 NOTE — Progress Notes (Signed)
Occupational Therapy Discharge Summary  Patient Details  Name: Rebecca CANNELLA MRN: [id number] Date of Birth: [birth year]   Patient has met 7 of 7 long term goals due to improved activity tolerance, improved balance and postural control.  Patient to discharge at overall Supervision level.  Patient's care partner is independent to provide the necessary physical assistance at discharge.  Pt's daughter has been present throughout rehab admission and is aware of pt's currentl level of performance of supervision  Overall. Hands on education has been provided for how to offer min-mod A for sit>stand if required from low level surfaces or when pt fatigued. Pt to live with daughter at d/c. Both have been educated and demonstrated competence in ability to empty ostomy into toilet as part of OT goals.  Showering task and transfers have not been addressed as pt not medically cleared to shower at this time and will cont to have to complete at sponge bathing level for foreseeable future due to extensive sacral wound.   Recommendation:  Patient with no further OT needs  Equipment: No equipment provided  Reasons for discharge: treatment goals met and discharge from hospital  Patient/family agrees with progress made and goals achieved: Yes  OT Discharge Precautions/Restrictions  Precautions Precautions: Fall Precaution Comments: colostomy, suprapubic catheter, large buttock wound Required Braces or Orthoses: Other Brace/Splint Other Brace/Splint: R ankle brace, shoes when OOB per family request Restrictions Weight Bearing Restrictions: No Vision Baseline Vision/History: Wears glasses;Cataracts(hx of cataract surgery) Wears Glasses: At all times Patient Visual Report: No change from baseline Vision Assessment?: No apparent visual deficits Perception  Perception: Within Functional Limits Praxis Praxis: Intact Cognition Overall Cognitive Status: Within Functional Limits for tasks  assessed Arousal/Alertness: Awake/alert Orientation Level: Oriented X4 Memory: Appears intact Awareness: Appears intact Problem Solving: Appears intact Safety/Judgment: Appears intact Sensation Sensation Light Touch: Appears Intact Proprioception: Appears Intact Coordination Gross Motor Movements are Fluid and Coordinated: Yes Fine Motor Movements are Fluid and Coordinated: Yes Coordination and Movement Description: Generalized weakness and deconditioning, however, much improved since admission Motor  Motor Motor: Within Functional Limits Motor - Discharge Observations: Generalized weakness/deconditioning Trunk/Postural Assessment  Cervical Assessment Cervical Assessment: Exceptions to WFL(Forward head) Thoracic Assessment Thoracic Assessment: Exceptions to WFL(Rounded shoulders; kyphotic) Lumbar Assessment Lumbar Assessment: Exceptions to WFL(Posterior pelvic tilt) Postural Control Postural Control: Within Functional Limits  Balance Balance Balance Assessed: Yes Dynamic Sitting Balance Dynamic Sitting - Balance Support: Feet supported;During functional activity Dynamic Sitting - Level of Assistance: 6: Modified independent (Device/Increase time) Sitting balance - Comments: Seated EOB Static Standing Balance Static Standing - Balance Support: During functional activity;Right upper extremity supported;Left upper extremity supported Static Standing - Level of Assistance: 6: Modified independent (Device/Increase time);5: Stand by assistance Static Standing - Comment/# of Minutes: Standing at sink to complete ADL tasks Dynamic Standing Balance Dynamic Standing - Balance Support: During functional activity;No upper extremity supported Dynamic Standing - Level of Assistance: 5: Stand by assistance Dynamic Standing - Comments: Standing to complete LB dressing tasks Extremity/Trunk Assessment RUE Assessment RUE Assessment: Within Functional Limits LUE Assessment LUE Assessment:  Within Functional Limits   See Function Navigator for Current Functional Status.  Olga Seyler L 07/18/2018, 9:52 AM

## 2018-07-18 NOTE — Patient Care Conference (Signed)
Inpatient RehabilitationTeam Conference and Plan of Care Update Date: 07/18/2018   Time: 2:55 PM    Patient Name: [name]      Medical Record Number: [id number]  Date of Birth: 06/25/1928 Sex: Female         Room/Bed: 4M04C/4M04C-01 Payor Info: Payor: [employer] MEDICARE / Plan: UHC MEDICARE / Product Type: *No Product type* /    Admitting Diagnosis: Debility  Admit Date/Time:  07/11/2018  3:27 PM Admission Comments: No comment available   Primary Diagnosis:  <principal problem not specified> Principal Problem: <principal problem not specified>  Patient Active Problem List   Diagnosis Date Noted  . Acute gout of left hand   . Acute blood loss anemia   . Acute on chronic diastolic (congestive) heart failure (HCC)   . Sepsis (HCC) 07/11/2018  . Debility   . OSA (obstructive sleep apnea)   . Diastolic dysfunction   . AKI (acute kidney injury) (HCC)   . Post-operative pain   . Tachypnea   . Steroid-induced hyperglycemia   . Leukocytosis   . Pressure injury of skin 06/27/2018  . Cellulitis and abscess of buttock   . Gastrointestinal hemorrhage   . Hypotension 06/26/2018  . ARF (acute renal failure) (HCC) 06/26/2018  . Black stool 06/26/2018  . Anemia 06/26/2018  . Bacteremia 03/07/2016  . Atypical chest pain 03/07/2016  . Acute-on-chronic kidney injury (HCC) 03/07/2016  . Essential hypertension 03/07/2016  . Chronic diastolic congestive heart failure (HCC) 03/07/2016  . Hypothyroidism 03/07/2016  . Depression with anxiety 03/07/2016  . UTI (lower urinary tract infection)   . Near syncope 08/20/2015  . Morbid obesity (HCC) 08/20/2015  . Pain in the chest 07/06/2015  . Awareness alteration, transient 05/07/2015  . History of stroke 05/07/2015    Expected Discharge Date: Expected Discharge Date: 07/19/18  Team Members Present: Physician leading conference: Dr. Maryla MorrowAnkit Patel Social Worker Present: Dossie DerBecky Dvontae Ruan, LCSW Nurse Present: Other (comment)(Christina  Tomlinson-RN) PT Present: Alyson ReedyElizabeth Tygielski, PT OT Present: Johnsie CancelAmy Lewis, OT SLP Present: Jackalyn LombardNicole Page, SLP PPS Coordinator present : Tora DuckMarie Noel, RN, CRRN     Current Status/Progress Goal Weekly Team Focus  Medical   Debility secondary to sepsis from Fournier gangrene with sacral ulcer, s/p debridement of left gluteal necrotizing soft tissue infection 06/27/2018 followed by debridement of large sacral perineal wound 06/29/2018, status post diverting colostomy placement and status post suprapubic placement 07/03/2018.  Improve mobility, safety, AKI  See above   Bowel/Bladder   Pt has history of incontinence episodes of bowel and bladder, currently has a suprapubic catheter in place and a colostomy  to remain continent  to assess q shift and prn   Swallow/Nutrition/ Hydration             ADL's   Supervision- occasional min A overall  Supervision  Family training, d/c planning. Pt d/c home tomorrow   Mobility   S overall, minA stairs  supervision overall, minA stairs/bed mobility  activity tolerance, LE strengthening, dynamic balance   Communication             Safety/Cognition/ Behavioral Observations            Pain   c/o pain to buttock area, received prn ultram 50 mg po at 2216-pt resting quietly when assessed within 1 hour  to have pain rating of less than 5/10  to assess and evaluate q shift and prn, notify MD if no relief   Skin   debrided large full thickness, unstageable wound to  buttocks, wet to dry dressing and santyl ointment bid, surgical suprapubic cath insertion site cleansed and desitin ointment applied with new    remain free from infection, advance in healing process  assess and address wound care treatments q shift and prn, note any changes and notify MD or wound care nurse as needed      *See Care Plan and progress notes for long and short-term goals.     Barriers to Discharge  Current Status/Progress Possible Resolutions Date Resolved   Physician    Medical  stability     See above  Therapies, optimize DM meds, follow labs      Nursing                  PT                    OT                  SLP                SW                Discharge Planning/Teaching Needs:  HOme with daughter and son in-law who can provide 24 hr care. Learning wound care, cath and colosotmy care.      Team Discussion:  Progressing toward her supervision level goals. Family education has been on-going with daughter's for cath, colostomy and sacral wound. Checking labs tomorrow and encouraging fluids. Still having pain issues. MD to talk with regarding following up with Wound Clinic  Revisions to Treatment Plan:  DC 8/22    Continued Need for Acute Rehabilitation Level of Care: The patient requires daily medical management by a physician with specialized training in physical medicine and rehabilitation for the following conditions: Daily direction of a multidisciplinary physical rehabilitation program to ensure safe treatment while eliciting the highest outcome that is of practical value to the patient.: Yes Daily medical management of patient stability for increased activity during participation in an intensive rehabilitation regime.: Yes Daily analysis of laboratory values and/or radiology reports with any subsequent need for medication adjustment of medical intervention for : Wound care problems;Diabetes problems  Marketta Valadez, Lemar LivingsRebecca G 07/18/2018, 2:56 PM

## 2018-07-18 NOTE — Discharge Summary (Signed)
Discharge summary job [phone]

## 2018-07-18 NOTE — Progress Notes (Signed)
Social Work Patient ID: [name], female   DOB: 09/10/1928, 84 y.o.   MRN: [id number]  Met with pt and daughter who was here to discuss team conference goals supervision level and discharge tomorrow. Equipment to be delivered via [facility] hopefully this evening and home health arranged via [facility]. MD to discuss with them about going to a Wound Clinic currently they do not plan to do this. Both are ready to get home and out of a hospital she has been in one for 22 days. Work on discharge for tomorrow.  

## 2018-07-18 NOTE — Progress Notes (Signed)
Subjective/Complaints: Patient seen lying in bed this morning. She states she slept well overnight.  ROS: denies CP, SOB, N/V/D  Objective: Vital Signs: Blood pressure 115/82, pulse 70, temperature 97.9 F (36.6 C), temperature source Oral, resp. rate 18, height 5' 4"  (1.626 m), weight 98 kg, SpO2 96 %. No results found. Results for orders placed or performed during the hospital encounter of 07/11/18 (from the past 72 hour(s))  Basic metabolic panel     Status: Abnormal   Collection Time: 07/16/18  7:22 AM  Result Value Ref Range   Sodium 143 135 - 145 mmol/L   Potassium 4.3 3.5 - 5.1 mmol/L   Chloride 104 98 - 111 mmol/L   CO2 31 22 - 32 mmol/L   Glucose, Bld 86 70 - 99 mg/dL   BUN 31 (H) 8 - 23 mg/dL   Creatinine, Ser 1.18 (H) 0.44 - 1.00 mg/dL   Calcium 8.5 (L) 8.9 - 10.3 mg/dL   GFR calc non Af Amer 40 (L) >60 mL/min   GFR calc Af Amer 46 (L) >60 mL/min    Comment: (NOTE) The eGFR has been calculated using the CKD EPI equation. This calculation has not been validated in all clinical situations. eGFR's persistently <60 mL/min signify possible Chronic Kidney Disease.    Anion gap 8 5 - 15    Comment: Performed at [facility] [address], [place], [place] [address]  CBC with Differential/Platelet     Status: Abnormal   Collection Time: 07/17/18  8:12 AM  Result Value Ref Range   WBC 10.4 4.0 - 10.5 K/uL   RBC 3.33 (L) 3.87 - 5.11 MIL/uL   Hemoglobin 9.8 (L) 12.0 - 15.0 g/dL   HCT 32.3 (L) 36.0 - 46.0 %   MCV 97.0 78.0 - 100.0 fL   MCH 29.4 26.0 - 34.0 pg   MCHC 30.3 30.0 - 36.0 g/dL   RDW 15.9 (H) 11.5 - 15.5 %   Platelets 227 150 - 400 K/uL   Neutrophils Relative % 55 %   Neutro Abs 5.8 1.7 - 7.7 K/uL   Lymphocytes Relative 26 %   Lymphs Abs 2.6 0.7 - 4.0 K/uL   Monocytes Relative 12 %   Monocytes Absolute 1.3 (H) 0.1 - 1.0 K/uL   Eosinophils Relative 3 %   Eosinophils Absolute 0.3 0.0 - 0.7 K/uL   Basophils Relative 1 %   Basophils Absolute 0.1 0.0  - 0.1 K/uL   Immature Granulocytes 3 %   Abs Immature Granulocytes 0.3 (H) 0.0 - 0.1 K/uL    Comment: Performed at [facility], [place] [address], [place], [place] [address]  Creatinine, serum     Status: Abnormal   Collection Time: 07/18/18  5:45 AM  Result Value Ref Range   Creatinine, Ser 1.38 (H) 0.44 - 1.00 mg/dL   GFR calc non Af Amer 33 (L) >60 mL/min   GFR calc Af Amer 38 (L) >60 mL/min    Comment: (NOTE) The eGFR has been calculated using the CKD EPI equation. This calculation has not been validated in all clinical situations. eGFR's persistently <60 mL/min signify possible Chronic Kidney Disease. Performed at [facility], [place] [address], [place], [place] [address]      Constitutional: No distress . Vital signs reviewed. HENT: Normocephalic.  Atraumatic. Eyes: EOMI. No discharge. Cardiovascular: RRR. No JVD. Respiratory: CTA bilaterally. Normal effort. GI: BS +. Non-distended. GU: suprapubic foley in place Musc: No edema or tenderness in extremities.  Neuro:  Alert/Oriented Motor: Bilateral upper extremities: 4+/5 proximal distal Right lower extremity: Hip flexion 3-/5, knee extension 4 -/5, ankle dorsiflexion 4+/5 (stable) Left lower extremity: Hip flexion: 3 --3/5, knee extension 4 -/5, ankle dorsiflexion 4+/5 (stable) Skin:   Wound large stage 4 left sacral with dressing in place, not examined today Psych: Slowed  Assessment/Plan: 1. Functional deficits secondary to Debilitysecondary to sepsis fromFourniergangrene. Status post debridement of left gluteal necrotizing soft tissue infection 06/27/2018 followed by debridement of large sacral perineal wound 06/29/2018, status post diverting colostomy placement and status post suprapubic placement 07/03/2018 which require 3+ hours per day of interdisciplinary therapy in a comprehensive inpatient rehab setting. Physiatrist is providing close team supervision and 24 hour management of active medical problems listed  below. Physiatrist and rehab team continue to assess barriers to discharge/monitor patient progress toward functional and medical goals. FIM: Function - Bathing Position: Wheelchair/chair at sink Body parts bathed by patient: Right arm, Left arm, Chest, Abdomen, Right upper leg, Left upper leg Body parts bathed by helper: Front perineal area, Buttocks, Right lower leg, Left lower leg, Back  Function- Upper Body Dressing/Undressing What is the patient wearing?: Hospital gown Pull over shirt/dress - Perfomed by patient: Thread/unthread right sleeve, Thread/unthread left sleeve, Put head through opening Pull over shirt/dress - Perfomed by helper: Pull shirt over trunk Function - Lower Body Dressing/Undressing What is the patient wearing?: Underwear, Shoes Position: Sitting EOB Underwear - Performed by patient: Thread/unthread right underwear leg, Thread/unthread left underwear leg, Pull underwear up/down Underwear - Performed by helper: Thread/unthread right underwear leg, Thread/unthread left underwear leg, Pull underwear up/down Shoes - Performed by patient: Don/doff right shoe, Don/doff left shoe Shoes - Performed by helper: Fasten right, Fasten left Assist for footwear: Supervision/touching assist Assist for lower body dressing: Touching or steadying assistance (Pt > 75%)  Function - Toileting Toileting activity did not occur: N/A Toileting steps completed by patient: Adjust clothing prior to toileting, Performs perineal hygiene, Adjust clothing after toileting Assist level: Touching or steadying assistance (Pt.75%)(Pt managing ostomy bag from standing position)  Function - Toilet Transfers Toilet transfer activity did not occur: N/A Toilet transfer assistive device: Elevated toilet seat/BSC over toilet Assist level to toilet: Touching or steadying assistance (Pt > 75%) Assist level from toilet: Maximal assist (Pt 25 - 49%/lift and lower)  Function - Chair/bed transfer Chair/bed  transfer method: Stand pivot Chair/bed transfer assist level: Touching or steadying assistance (Pt > 75%) Chair/bed transfer assistive device: Armrests, Walker, Orthosis Chair/bed transfer details: Verbal cues for technique, Verbal cues for precautions/safety, Verbal cues for safe use of DME/AE, Tactile cues for posture, Tactile cues for sequencing, Tactile cues for initiation  Function - Locomotion: Wheelchair Assist Level: Dependent (Pt equals 0%)(for time management) Function - Locomotion: Ambulation Assistive device: Walker-rolling Max distance: 125 Assist level: Touching or steadying assistance (Pt > 75%) Assist level: Touching or steadying assistance (Pt > 75%) Walk 50 feet with 2 turns activity did not occur: Safety/medical concerns Assist level: Touching or steadying assistance (Pt > 75%) Walk 150 feet activity did not occur: Safety/medical concerns Walk 10 feet on uneven surfaces activity did not occur: Safety/medical concerns  Function - Comprehension Comprehension: Auditory Comprehension assist level: Follows complex conversation/direction with extra time/assistive device  Function - Expression Expression: Verbal Expression assist level: Expresses complex ideas: With no assist  Function - Social Interaction Social Interaction assist level: Interacts appropriately with others with medication or extra time (anti-anxiety, antidepressant).  Function - Problem Solving Problem solving assist level: Solves  basic 90% of the time/requires cueing < 10% of the time  Function - Memory Memory assist level: Recognizes or recalls 90% of the time/requires cueing < 10% of the time Patient normally able to recall (first 3 days only): Current season, That he or she is in a hospital  Medical Problem List and Plan: 1.Debilitysecondary to sepsis fromFourniergangrene. Status post debridement of left gluteal necrotizing soft tissue infection 06/27/2018 followed by debridement of large  sacral perineal wound 06/29/2018, status post diverting colostomy placement and status post suprapubic placement 07/03/2018.  Cont CIR  2. DVT Prophylaxis/Anticoagulation: Subcutaneous Lovenox.   Dopplers neg for DVT 3. Pain Management:Ultram as needed 4. Mood:Zoloft 50 mg daily, also on chronic low dose xanax at night, per daughter not sleeping well will resumed  5. Neuropsych: This patientiscapable of making decisions on herown behalf. 6. Skin/Wound Care:Skin care as directed 7. Fluids/Electrolytes/Nutrition:Routine in and outs  8.ID: Augmentin completed 9.Acute on chronic grade 2 diastolic dysfunction. Monitor for any signs of fluid overload. Continue Lasix  Filed Weights   07/16/18 0346 07/17/18 0700 07/18/18 0425  Weight: 90.1 kg 97.7 kg 98 kg   ? Reliability on 8/21 10.AKI.   Creatinine 1.38 on 8/21  Encourage fluids  Labs ordered for tomorrow 11.Hypothyroidism. Synthroid 12.Morbid obesity. BMI 40.58. Dietary follow-up 13.OSA-CPAP 14.  ABLA   Hemoglobin 9.8 on 8/20, likely concentrated  Labs ordered for tomorrow 15.  Hx gout Left thumb MCP with flare up , improved with medrol dose pack, resumed allopurinol   Xray reviewed - showing CMC arthritis  Continue to monitor  LOS (Days) 7 A FACE TO FACE EVALUATION WAS PERFORMED  Ankit Lorie Phenix 07/18/2018, 7:35 AM

## 2018-07-18 NOTE — Progress Notes (Signed)
Occupational Therapy Session Note  Patient Details  Name: Rebecca KayGracie B Deans MRN: [id number] Date of Birth: 01/17/1928  Today's Date: 07/18/2018 OT Individual Time: 1115-1200 OT Individual Time Calculation (min): 45 min  and Today's Date: 07/18/2018 OT Missed Time: 15 Minutes Missed Time Reason: Nursing care   Short Term Goals: Week 1:  OT Short Term Goal 1 (Week 1): Pt will thread B LEs into underwear with supervision using AE PRN OT Short Term Goal 2 (Week 1): Pt will complete clothing management in order to access ostomy with steadying assist standing at toilet OT Short Term Goal 3 (Week 1): Pt will complete two grooming tasks in standing with steadying assist in order to increase functional activity tolerance  Skilled Therapeutic Interventions/Progress Updates:    Therapist arrived at 7:30 for scheduled OT session. Pt's daughter reporting sacrum dressing neeeded to be changed, therapist assisted with sit>stand from EOB to assess. RN requesting therapist have pt return to bed level for dressing change. Therapist assisted and pt left in bed with family present and RN to change dressing. Will attempt to see pt again later in day as able.   Therapist returned at 11:15, pt sitting EOB and agreeable to tx session, daughter present for tx session.  Pt completed sit>stands throughout session with supervision, able to push from w/c armrests with B UEs to power into standing. She ambulated throughout room and hallway with close supervision using RW. Completed UB bathing/dressing standing at sink with heavy reliance on UEs for support and energy conservation. She sat to complete LB bathing in w/c. Following seated rest break, pt ambulated to ADL apartment, completed simulated toileting task, standing at toilet and managing clothing in order to empty ostomy bag. Pt required seated rest breaks throughout session, however, demonstrated ability to complete ostomy care from standing position. Pt returned to room  in w/c for energy conservation. Returned to sitting EOB to eat lunch. Left with all needs in reach and family members present. Throughout session, extensive education provided regarding continuum of care, importance of participate with ADLs in order to increase functional activity tolerance/endurance, and d/c planning.   Therapy Documentation Precautions:  Precautions Precautions: Fall Precaution Comments: colostomy, suprapubic catheter, large buttock wound Required Braces or Orthoses: Other Brace/Splint Other Brace/Splint: R ankle brace, shoes when OOB per family request Restrictions Weight Bearing Restrictions: No  See Function Navigator for Current Functional Status.   Therapy/Group: Individual Therapy  Zennie Ayars L 07/18/2018, 6:53 AM

## 2018-07-19 ENCOUNTER — Ambulatory Visit (HOSPITAL_COMMUNITY): Payer: Medicare Other

## 2018-07-19 DIAGNOSIS — L98429 Non-pressure chronic ulcer of back with unspecified severity: Secondary | ICD-10-CM

## 2018-07-19 LAB — CBC WITH DIFFERENTIAL/PLATELET
Abs Immature Granulocytes: 0.2 10*3/uL — ABNORMAL HIGH (ref 0.0–0.1)
Basophils Absolute: 0.1 10*3/uL (ref 0.0–0.1)
Basophils Relative: 1 %
EOS PCT: 3 %
Eosinophils Absolute: 0.2 10*3/uL (ref 0.0–0.7)
HCT: 30.3 % — ABNORMAL LOW (ref 36.0–46.0)
HEMOGLOBIN: 9.1 g/dL — AB (ref 12.0–15.0)
Immature Granulocytes: 2 %
LYMPHS ABS: 2.8 10*3/uL (ref 0.7–4.0)
LYMPHS PCT: 33 %
MCH: 29.1 pg (ref 26.0–34.0)
MCHC: 30 g/dL (ref 30.0–36.0)
MCV: 96.8 fL (ref 78.0–100.0)
MONO ABS: 1.2 10*3/uL — AB (ref 0.1–1.0)
Monocytes Relative: 14 %
Neutro Abs: 4 10*3/uL (ref 1.7–7.7)
Neutrophils Relative %: 47 %
PLATELETS: 204 10*3/uL (ref 150–400)
RBC: 3.13 MIL/uL — AB (ref 3.87–5.11)
RDW: 15.9 % — AB (ref 11.5–15.5)
WBC: 8.5 10*3/uL (ref 4.0–10.5)

## 2018-07-19 LAB — BASIC METABOLIC PANEL
ANION GAP: 6 (ref 5–15)
BUN: 30 mg/dL — AB (ref 8–23)
CALCIUM: 8 mg/dL — AB (ref 8.9–10.3)
CO2: 29 mmol/L (ref 22–32)
Chloride: 106 mmol/L (ref 98–111)
Creatinine, Ser: 1.22 mg/dL — ABNORMAL HIGH (ref 0.44–1.00)
GFR calc Af Amer: 44 mL/min — ABNORMAL LOW (ref >60)
GFR, EST NON AFRICAN AMERICAN: 38 mL/min — AB (ref >60)
GLUCOSE: 93 mg/dL (ref 70–99)
POTASSIUM: 4.1 mmol/L (ref 3.5–5.1)
SODIUM: 141 mmol/L (ref 135–145)

## 2018-07-19 MED ORDER — ACETAMINOPHEN 325 MG PO TABS
650.0000 mg | ORAL_TABLET | Freq: Four times a day (QID) | ORAL | Status: DC | PRN
Start: 2018-07-19 — End: 2020-02-05

## 2018-07-19 MED ORDER — OMEPRAZOLE 40 MG PO CPDR: 40.0000 mg | DELAYED_RELEASE_CAPSULE | Freq: Every day | ORAL | 0 refills | Status: AC

## 2018-07-19 MED ORDER — LEVOTHYROXINE SODIUM 137 MCG PO TABS: 137.0000 ug | ORAL_TABLET | Freq: Every day | ORAL | 0 refills | Status: DC

## 2018-07-19 MED ORDER — CALCIUM CITRATE-VITAMIN D 315-200 MG-UNIT PO TABS: 1.0000 | ORAL_TABLET | Freq: Every day | ORAL | 0 refills | Status: DC

## 2018-07-19 MED ORDER — TRAMADOL HCL 50 MG PO TABS: 50.0000 mg | ORAL_TABLET | Freq: Four times a day (QID) | ORAL | 0 refills | Status: DC | PRN

## 2018-07-19 MED ORDER — ALPRAZOLAM 0.25 MG PO TABS: 0.2500 mg | ORAL_TABLET | Freq: Every day | ORAL | 0 refills | Status: DC

## 2018-07-19 MED ORDER — ZINC OXIDE 40 % EX OINT: TOPICAL_OINTMENT | Freq: Two times a day (BID) | CUTANEOUS | 0 refills | Status: DC

## 2018-07-19 MED ORDER — ALBUTEROL SULFATE HFA 108 (90 BASE) MCG/ACT IN AERS: 2.0000 | INHALATION_SPRAY | Freq: Four times a day (QID) | RESPIRATORY_TRACT | 1 refills | Status: AC | PRN

## 2018-07-19 MED ORDER — SERTRALINE HCL 50 MG PO TABS: 50.0000 mg | ORAL_TABLET | Freq: Every day | ORAL | 0 refills | Status: DC

## 2018-07-19 MED ORDER — COLLAGENASE 250 UNIT/GM EX OINT: TOPICAL_OINTMENT | Freq: Two times a day (BID) | CUTANEOUS | 0 refills | Status: DC

## 2018-07-19 MED ORDER — ALLOPURINOL 100 MG PO TABS: 100.0000 mg | ORAL_TABLET | Freq: Every day | ORAL | 0 refills | Status: AC

## 2018-07-19 MED ORDER — FUROSEMIDE 40 MG PO TABS: 40.0000 mg | ORAL_TABLET | Freq: Every day | ORAL | 0 refills | Status: AC

## 2018-07-19 NOTE — Progress Notes (Signed)
Wound ostomy to come and do teaching/hands on with family.

## 2018-07-19 NOTE — Progress Notes (Signed)
Social Work  Discharge Note  The overall goal for the admission was met for:   Discharge location: Yes-HOME TO [facility]  Length of Stay: Yes-9 DAYS  Discharge activity level: Yes-SUPERVISION LEVEL  Home/community participation: Yes  Services provided included: MD, RD, PT, OT, RN, CM, Pharmacy, Neuropsych and SW  Financial Services: Private Insurance: [facility]  Follow-up services arranged: Home Health: [facility]-PT,OT,RN, DME: [place] and Patient/Family request agency HH: NO PREF, DME: [facility] PER FAMILY REQUEST TO [place], AND CAN GET IN SOONER-TOMORROW. ALL INFORMATION GIVEN TO FAMILY.  Comments (or additional information):FAMILY MEMBERS WERE HERE DAILY AND PARTICIPATED IN HER THERAPIES AND LEARNING HER WOUND CARE, CATH AND COLOSTOMY CARE. PLAN NOW TO [facility] TO [facility]) DAN-PA MADE REFERRAL.   Patient/Family verbalized understanding of follow-up arrangements: Yes  Individual responsible for coordination of the follow-up plan: CINDY AND BRENDA-DAUGHTERS  Confirmed correct DME delivered: Elease Hashimoto 07/19/2018    Elease Hashimoto

## 2018-07-19 NOTE — Discharge Instructions (Signed)
Inpatient Rehab Discharge Instructions  Rebecca KayGracie B Scheid Discharge date and time: No discharge date for patient encounter.   Activities/Precautions/ Functional Status: Activity: activity as tolerated Diet: regular diet Wound Care: keep wound clean and dry Functional status:  ___ No restrictions     ___ Walk up steps independently ___ 24/7 supervision/assistance   ___ Walk up steps with assistance ___ Intermittent supervision/assistance  ___ Bathe/dress independently ___ Walk with walker     _x__ Bathe/dress with assistance ___ Walk Independently    ___ Shower independently ___ Walk with assistance    ___ Shower with assistance ___ No alcohol     ___ Return to work/school ________  Special Instructions: Routine suprapubic care and colostomy care   COMMUNITY REFERRALS UPON DISCHARGE:    Home Health:   PT, OT, RN  Agency:[facility]   Phone:[phone] Date of last service:07/19/2018  Medical Equipment/Items Ordered:WHEELCHAIR, HOSPITAL BED, LOW AIR LOSS MATTRESS  Agency/Supplier:[facility]  [phone]-HOSPITAL BED AND LOW AIR LOSS MATTRESS  [facility]-WHEELCHAIR AND ROHO CUSHION  [facility] [phone]    Gently cleanse around suprapubic catheter site.  Pat dry.  Apply Desitin around tube site place a split gauze dressing around the site.  Change of dressing becomes soiled prior to scheduled changes.   Twice daily wet-to-dry dressing left buttocks change as needed using paper tape   My questions have been answered and I understand these instructions. I will adhere to these goals and the provided educational materials after my discharge from the hospital.  Patient/Caregiver Signature _______________________________ Date __________  Clinician Signature _______________________________________ Date __________  Please bring this form and your medication list with you to all your follow-up doctor's appointments.

## 2018-07-19 NOTE — Progress Notes (Signed)
Physical Therapy Wound Treatment Patient Details  Name: [name] MRN: [id number] Date of Birth: 11/24/1928  Today's Date: 07/19/2018 PT Individual Time: 1020-1059 PT Individual Time Calculation (min): 39 min   Subjective  Subjective: Pt pleasant and agreeable to hydrotherapy.  Patient and Family Stated Goals: Discharge today Date of Onset: (unsure) Prior Treatments: Surgical debridement 06/27/18 and 06/29/18. On 8/6 pt underwent diverting colostomy and SP catheter placement.  Pain Score:  Pt tolerated well without reports of pain.  Wound Assessment  Pressure Injury 06/27/18 Unstageable - Full thickness tissue loss in which the base of the ulcer is covered by slough (yellow, tan, gray, green or brown) and/or eschar (tan, brown or black) in the wound bed. Three areas of black eschar surrounded by blan (Active)  Wound Image   07/16/2018 12:47 PM  Dressing Type ABD;Gauze (Comment);Barrier Film (skin prep);Moist to dry;Paper tape 07/19/2018 12:39 PM  Dressing Changed 07/19/2018 12:39 PM  Dressing Change Frequency Daily 07/19/2018 12:39 PM  State of Healing Early/partial granulation 07/19/2018 12:39 PM  Site / Wound Assessment Red;Yellow 07/19/2018 12:39 PM  % Wound base Red or Granulating 90% 07/19/2018 12:39 PM  % Wound base Yellow/Fibrinous Exudate 10% 07/19/2018 12:39 PM  % Wound base Black/Eschar 0% 07/19/2018 12:39 PM  % Wound base Other/Granulation Tissue (Comment) 0% 07/19/2018 12:39 PM  Peri-wound Assessment Intact;Erythema (blanchable) 07/19/2018 12:39 PM  Wound Length (cm) 16.2 cm 07/16/2018 10:31 AM  Wound Width (cm) 15.5 cm 07/16/2018 10:31 AM  Wound Depth (cm) 4.4 cm 07/16/2018 10:31 AM  Wound Surface Area (cm^2) 251.1 cm^2 07/16/2018 10:31 AM  Wound Volume (cm^3) 1104.84 cm^3 07/16/2018 10:31 AM  Tunneling (cm) 7.9 cm in center of wound; 6.6 cm tunnel at 11 o'clock 07/16/2018 12:47 PM  Undermining (cm) 1.7cm from 6-8 o'clock; 5.6cm 10-11o'clock; 3.9cm 11-12 o'clock 07/16/2018 12:47 PM    Margins Unattached edges (unapproximated) 07/19/2018 12:39 PM  Drainage Amount Moderate 07/19/2018 12:39 PM  Drainage Description Serosanguineous 07/19/2018 12:39 PM  Treatment Debridement (Selective);Hydrotherapy (Pulse lavage);Packing (Saline gauze) 07/19/2018 12:39 PM   Santyl applied to wound bed prior to applying dressing.     Hydrotherapy Pulsed lavage therapy - wound location: Buttock Pulsed Lavage with Suction (psi): 12 psi Pulsed Lavage with Suction - Normal Saline Used: 1000 mL Pulsed Lavage Tip: Tip with splash shield Selective Debridement Selective Debridement - Location: Buttock Selective Debridement - Tools Used: Forceps;Scissors Selective Debridement - Tissue Removed: Yellow slough where leading edge is present. Slough is very adherent.     Wound Assessment and Plan  Wound Therapy - Assess/Plan/Recommendations Wound Therapy - Clinical Statement: Updated daughter on preferred packing technique based on wound appearance today. Pt anticipates d/c today however if does not d/c pt will benefit from continued hydrotherapy to reduce bioburden and improve wound healing.   Wound Therapy - Functional Problem List: Decreased tolerance for OOB mobility/position changes Factors Delaying/Impairing Wound Healing: Immobility;Multiple medical problems Hydrotherapy Plan: Debridement;Dressing change;Patient/family education;Pulsatile lavage with suction Wound Therapy - Frequency: 6X / week Wound Therapy - Follow Up Recommendations: Home health RN Wound Plan: See above  Wound Therapy Goals- Improve the function of patient's integumentary system by progressing the wound(s) through the phases of wound healing (inflammation - proliferation - remodeling) by: Decrease Necrotic Tissue to: 0% Decrease Necrotic Tissue - Progress: Progressing toward goal Increase Granulation Tissue to: 100% Increase Granulation Tissue - Progress: Progressing toward goal Goals/treatment plan/discharge plan were made  with and agreed upon by patient/family: Yes Time For Goal Achievement: 7 days Wound Therapy -  Potential for Goals: Good  Goals will be updated until maximal potential achieved or discharge criteria met.  Discharge criteria: when goals achieved, discharge from hospital, MD decision/surgical intervention, no progress towards goals, refusal/missing three consecutive treatments without notification or medical reason.  GP     Thelma Comp 07/19/2018, 12:49 PM   Rolinda Roan, PT, DPT Acute Rehabilitation Services Pager: [phone]

## 2018-07-19 NOTE — Progress Notes (Signed)
Greater than 30 minutes spent with patient and multiple family members in counseling and coordination of care regarding Foley, wound management and venue DME in preparation for discharge. Please also see discharge summary.

## 2018-07-19 NOTE — Consult Note (Signed)
WOC Nurse ostomy follow up Stoma type/location: LLQ colostomy Pouch is intact and was changed last night.  Both daughters at bedside and verbalize confidence in wound care and ostomy care.  They state they understand emptying and there is a Designer, jewelleryregistered nurse and a Agricultural engineernursing assistant in the family that live next door.  Home with Home Health services.  5 pouch sets included.  Will not follow at this time.  Please re-consult if needed.  Maple HudsonKaren Rees Matura RN BSN CWON Pager [phone]

## 2018-07-19 NOTE — Progress Notes (Signed)
Ostomy teaching has been completed and daughters feel comfortable with care. Colostomy emptied by daughter at this time. Agency arrangements taken care and family made aware. Proper cushion received for w/c. Family verbalized understanding of pt care, medications, and follow up appts. D/C instructions provided to family by Richardson Chiquitoan Angiuli PA. Pt d/c with personal belongings.

## 2018-07-19 NOTE — Plan of Care (Signed)
Pt d/c home. Family verbalized understanding of d/c instructions, dressing changes, medications, and follow up care.

## 2018-07-19 NOTE — Progress Notes (Signed)
Discussed suprapubic catheter, cleaning, dressing changes. Family verbalizes understanding.  Hydrotherapy going over dressing change to buttocks, daughter able to verbalize how to do dressing changes and when needed.  Family also emptying colostomy bag and wound care going over dressing changes for home care. Family has enclosure for bag so that doesn't touch floor.  Will continue to monitor.

## 2018-07-20 ENCOUNTER — Encounter: Payer: Self-pay | Admitting: *Deleted

## 2018-07-20 ENCOUNTER — Ambulatory Visit (HOSPITAL_COMMUNITY): Payer: Medicare Other

## 2018-07-20 NOTE — Telephone Encounter (Signed)
Opened in error

## 2018-07-21 ENCOUNTER — Ambulatory Visit (HOSPITAL_COMMUNITY): Payer: Medicare Other

## 2018-07-23 ENCOUNTER — Ambulatory Visit (HOSPITAL_COMMUNITY): Payer: Medicare Other

## 2018-07-24 ENCOUNTER — Ambulatory Visit (HOSPITAL_COMMUNITY): Payer: Medicare Other

## 2018-07-24 ENCOUNTER — Encounter (HOSPITAL_BASED_OUTPATIENT_CLINIC_OR_DEPARTMENT_OTHER): Payer: Medicare Other | Attending: Internal Medicine

## 2018-07-24 DIAGNOSIS — Y838 Other surgical procedures as the cause of abnormal reaction of the patient, or of later complication, without mention of misadventure at the time of the procedure: Secondary | ICD-10-CM | POA: Insufficient documentation

## 2018-07-24 DIAGNOSIS — T8189XA Other complications of procedures, not elsewhere classified, initial encounter: Secondary | ICD-10-CM | POA: Diagnosis present

## 2018-07-24 DIAGNOSIS — Z933 Colostomy status: Secondary | ICD-10-CM | POA: Diagnosis not present

## 2018-07-24 DIAGNOSIS — I509 Heart failure, unspecified: Secondary | ICD-10-CM | POA: Diagnosis not present

## 2018-07-24 DIAGNOSIS — G4730 Sleep apnea, unspecified: Secondary | ICD-10-CM | POA: Insufficient documentation

## 2018-07-24 DIAGNOSIS — J449 Chronic obstructive pulmonary disease, unspecified: Secondary | ICD-10-CM | POA: Diagnosis not present

## 2018-07-24 DIAGNOSIS — Z87891 Personal history of nicotine dependence: Secondary | ICD-10-CM | POA: Diagnosis not present

## 2018-07-24 DIAGNOSIS — I110 Hypertensive heart disease with heart failure: Secondary | ICD-10-CM | POA: Insufficient documentation

## 2018-07-25 ENCOUNTER — Ambulatory Visit (HOSPITAL_COMMUNITY): Payer: Medicare Other

## 2018-07-26 ENCOUNTER — Ambulatory Visit (HOSPITAL_COMMUNITY): Payer: Medicare Other

## 2018-07-28 LAB — FUNGAL ORGANISM REFLEX

## 2018-07-28 LAB — FUNGUS CULTURE WITH STAIN

## 2018-07-28 LAB — FUNGUS CULTURE RESULT

## 2018-08-01 ENCOUNTER — Encounter: Payer: Medicare Other | Admitting: Physical Medicine & Rehabilitation

## 2018-08-08 ENCOUNTER — Encounter: Payer: Self-pay | Admitting: Physical Medicine & Rehabilitation

## 2018-08-08 ENCOUNTER — Encounter: Payer: Medicare Other | Attending: Physical Medicine & Rehabilitation | Admitting: Physical Medicine & Rehabilitation

## 2018-08-08 VITALS — BP 109/71 | HR 84 | Resp 14

## 2018-08-08 DIAGNOSIS — R5381 Other malaise: Secondary | ICD-10-CM | POA: Insufficient documentation

## 2018-08-08 DIAGNOSIS — R269 Unspecified abnormalities of gait and mobility: Secondary | ICD-10-CM

## 2018-08-08 DIAGNOSIS — F329 Major depressive disorder, single episode, unspecified: Secondary | ICD-10-CM | POA: Diagnosis not present

## 2018-08-08 DIAGNOSIS — Z933 Colostomy status: Secondary | ICD-10-CM | POA: Diagnosis not present

## 2018-08-08 DIAGNOSIS — D62 Acute posthemorrhagic anemia: Secondary | ICD-10-CM | POA: Diagnosis not present

## 2018-08-08 DIAGNOSIS — G8918 Other acute postprocedural pain: Secondary | ICD-10-CM | POA: Diagnosis not present

## 2018-08-08 DIAGNOSIS — K219 Gastro-esophageal reflux disease without esophagitis: Secondary | ICD-10-CM | POA: Diagnosis not present

## 2018-08-08 DIAGNOSIS — F418 Other specified anxiety disorders: Secondary | ICD-10-CM

## 2018-08-08 DIAGNOSIS — G4733 Obstructive sleep apnea (adult) (pediatric): Secondary | ICD-10-CM | POA: Insufficient documentation

## 2018-08-08 DIAGNOSIS — E079 Disorder of thyroid, unspecified: Secondary | ICD-10-CM | POA: Insufficient documentation

## 2018-08-08 DIAGNOSIS — I10 Essential (primary) hypertension: Secondary | ICD-10-CM | POA: Insufficient documentation

## 2018-08-08 DIAGNOSIS — R42 Dizziness and giddiness: Secondary | ICD-10-CM | POA: Insufficient documentation

## 2018-08-08 NOTE — Progress Notes (Addendum)
Subjective:    Patient ID: [name], female    DOB: December 16, 1927, 84 y.o.   MRN: [id number]  HPI 84 year old right-handed female with history of obstructive sleep apnea, morbid obesity presents for hospital follow up for debility.  DATE OF DISCHARGE:  07/19/2018 ADMIT DATE:  07/11/2018  Family provides majority of history. At discharge, pt was instructed to follow up with surgery, which she has done.  She sees Urology in October.  They are having to difficulty getting insurance to pay for dressing changes.  She is going to wound care.  Sleep is poor, "just can't sleep."  She had and was treated for a UTI.  Therapies: 2/week DME: Hospital bed Mobility: Walker at home, wheelchair in community  Pain Inventory Average Pain 8 Pain Right Now 9 My pain is burning and stabbing  In the last 24 hours, has pain interfered with the following? General activity 10 Relation with others 7 Enjoyment of life 7 What TIME of day is your pain at its worst? morning, evening, night Sleep (in general) Poor  Pain is worse with: bending and sitting Pain improves with: rest and medication Relief from Meds: 3  Mobility walk with assistance use a walker ability to climb steps?  yes do you drive?  no needs help with transfers transfers alone  Function not employed: date last employed . I need assistance with the following:  dressing and bathing  Neuro/Psych bladder control problems bowel control problems dizziness confusion depression anxiety loss of taste or smell  Prior Studies hospital f/u  Physicians involved in your care hospital f/u   Family History  Problem Relation Age of Onset  . Diabetes Mother   . Stroke Mother   . Cancer Brother   . Kidney disease Sister    Social History   Socioeconomic History  . Marital status: Widowed    Spouse name: Not on file  . Number of children: 4  . Years of education: Not on file  . Highest education level: Not on file    Occupational History  . Occupation: Retired  Engineer, production  . Financial resource strain: Not on file  . Food insecurity:    Worry: Not on file    Inability: Not on file  . Transportation needs:    Medical: Not on file    Non-medical: Not on file  Tobacco Use  . Smoking status: Former Games developer  . Smokeless tobacco: Never Used  Substance and Sexual Activity  . Alcohol use: No    Alcohol/week: 0.0 standard drinks  . Drug use: No  . Sexual activity: Not on file  Lifestyle  . Physical activity:    Days per week: Not on file    Minutes per session: Not on file  . Stress: Not on file  Relationships  . Social connections:    Talks on phone: Not on file    Gets together: Not on file    Attends religious service: Not on file    Active member of club or organization: Not on file    Attends meetings of clubs or organizations: Not on file    Relationship status: Not on file  Other Topics Concern  . Not on file  Social History Narrative  . Not on file   Past Surgical History:  Procedure Laterality Date  . APPENDECTOMY  1946  . CATARACT EXTRACTION     bilateral  . GALLBLADDER SURGERY    . INCISION AND DRAINAGE ABSCESS N/A 06/29/2018   Procedure:  REPEAT INCISION AND DRAINAGE  BUTTOCK ABSCESS;  Surgeon: Harriette Bouillon, MD;  Location: [facility] OR;  Service: General;  Laterality: N/A;  . INCISION AND DRAINAGE PERIRECTAL ABSCESS Left 06/27/2018   Procedure: IRRIGATION AND DEBRIDEMENT BUTTOCK ABSCESS;  Surgeon: Andria Meuse, MD;  Location: [facility] OR;  Service: General;  Laterality: Left;  . INSERTION OF SUPRAPUBIC CATHETER N/A 07/03/2018   Procedure: INSERTION OF SUPRAPUBIC CATHETER;  Surgeon: Crista Elliot, MD;  Location: [facility] OR;  Service: Urology;  Laterality: N/A;  . KNEE ARTHROPLASTY  2006   left   . KNEE ARTHROPLASTY  2006   right   . LAPAROSCOPIC DIVERTED COLOSTOMY N/A 07/03/2018   Procedure: LAPAROSCOPIC DIVERTED COLOSTOMY;  Surgeon: Axel Filler, MD;  Location: [facility] OR;  Service:  General;  Laterality: N/A;  . SHOULDER ARTHROSCOPY  2009   right  . TOTAL HIP ARTHROPLASTY  2002    left   Past Medical History:  Diagnosis Date  . Chest pain   . Depression   . GERD (gastroesophageal reflux disease)   . Hypertension   . Sleep apnea   . Thyroid disease    BP 109/71   Pulse 84   Resp 14   SpO2 94%   Opioid Risk Score:   Fall Risk Score:  `1  Depression screen PHQ 2/9  No flowsheet data found.  Review of Systems  Constitutional: Positive for appetite change.  HENT: Negative.   Eyes: Negative.   Respiratory: Negative.   Cardiovascular: Negative.   Gastrointestinal: Positive for abdominal pain.       Ostomy bag   Endocrine:       High blood sugar  Genitourinary: Positive for difficulty urinating.  Musculoskeletal: Positive for arthralgias, back pain, gait problem and myalgias.  Neurological: Positive for dizziness and weakness.  Psychiatric/Behavioral: Positive for confusion and dysphoric mood. The patient is nervous/anxious.        Objective:   Physical Exam Constitutional: No distress . Vital signs reviewed. HENT: Normocephalic.  Atraumatic. Eyes: EOMI. No discharge. Cardiovascular: RRR. No JVD. Respiratory: CTA bilaterally. Normal effort. GI: BS +. Non-distended. GU: suprapubic foley in place Musc: No edema or tenderness in extremities. Neuro:  Alert/Oriented Motor: Bilateral upper extremities: 4+/5 proximal distal Right lower extremity: Hip flexion 2+/5, knee extension 3-/5, ankle dorsiflexion 3-/5  Left lower extremity: Hip flexion: 4-/5, knee extension 4-/5, ankle dorsiflexion 4+/5  Skin:   Wound large stage 4 left sacral with dressing in place, not examined today Psych: Depressed    Assessment & Plan:  84 year old right-handed female with history of obstructive sleep apnea, morbid obesity presents for hospital follow up for debility.  1. Debility secondary to sepsis from Fournier gangrene.  Status post debridement of left gluteal  necrotizing soft tissue infection 06/27/2018 followed by debridement of large sacral perineal wound 06/29/2018, status post diverting colostomy placement and status post suprapubic placement 07/03/2018.  Cont therapies  Cont follow up at wound center - consider E-stim  2. Pain Management:   Ultram with side effects of dizziness  Encouraged trial OTC meds, including Lidoderm patch  3. Depression  Encouraged follow up with PCP with adjustment in medication  4.  Acute on chronic grade 2 diastolic dysfunction.    Encouraged monitoring weights  5.  ABLA   Per family, PCP ordered lab works, follow up  6. Gait abnormality  Cont walker/wheelchair for safety  Cont therapies  7. Morbid obesity  Encouraged weight loss  Meds reviewed Referrals reviewed All questions answered

## 2018-08-08 NOTE — Patient Instructions (Signed)
Please consider E-stim for wound healing/pain relief

## 2018-08-16 ENCOUNTER — Encounter (HOSPITAL_BASED_OUTPATIENT_CLINIC_OR_DEPARTMENT_OTHER): Payer: Medicare Other | Attending: Internal Medicine

## 2018-09-05 ENCOUNTER — Ambulatory Visit: Payer: Medicare Other | Admitting: Physical Medicine & Rehabilitation

## 2018-10-12 ENCOUNTER — Other Ambulatory Visit: Payer: Self-pay | Admitting: Student

## 2018-10-12 DIAGNOSIS — N7682 Fournier disease of vagina and vulva: Secondary | ICD-10-CM

## 2018-10-12 DIAGNOSIS — N7689 Other specified inflammation of vagina and vulva: Secondary | ICD-10-CM

## 2018-12-02 ENCOUNTER — Other Ambulatory Visit: Payer: Self-pay

## 2018-12-02 ENCOUNTER — Emergency Department (HOSPITAL_COMMUNITY)
Admission: EM | Admit: 2018-12-02 | Discharge: 2018-12-02 | Disposition: A | Discharge status: Home / Self Care | Payer: Medicare Other | Attending: Emergency Medicine | Admitting: Emergency Medicine

## 2018-12-02 ENCOUNTER — Encounter (HOSPITAL_COMMUNITY): Payer: Self-pay | Admitting: Emergency Medicine

## 2018-12-02 DIAGNOSIS — N311 Reflex neuropathic bladder, not elsewhere classified: Secondary | ICD-10-CM | POA: Diagnosis not present

## 2018-12-02 DIAGNOSIS — Z466 Encounter for fitting and adjustment of urinary device: Secondary | ICD-10-CM | POA: Diagnosis present

## 2018-12-02 DIAGNOSIS — T83028A Displacement of other indwelling urethral catheter, initial encounter: Secondary | ICD-10-CM

## 2018-12-02 DIAGNOSIS — I10 Essential (primary) hypertension: Secondary | ICD-10-CM | POA: Insufficient documentation

## 2018-12-02 DIAGNOSIS — Z87891 Personal history of nicotine dependence: Secondary | ICD-10-CM | POA: Diagnosis not present

## 2018-12-02 DIAGNOSIS — R338 Other retention of urine: Secondary | ICD-10-CM

## 2018-12-02 DIAGNOSIS — T83010A Breakdown (mechanical) of cystostomy catheter, initial encounter: Secondary | ICD-10-CM

## 2018-12-02 DIAGNOSIS — Z79899 Other long term (current) drug therapy: Secondary | ICD-10-CM | POA: Insufficient documentation

## 2018-12-02 NOTE — Consult Note (Signed)
Reason for Consult: Displaced Suprapubic Tube  Referring Physician: Burgess Amor PA  [name] is an 85 y.o. female.   HPI:   1 - Posterior Perineal Necrotizing Fascitis - s/p staged surgical debridement of posterior perineal fornier's by general surgery 06/2018. Also diverting colostomy 07/03/18. Essentially resolved by exam 11/2018.   2 - Displaced SP Tube - SPT placed 07/03/18 by Dr. Alvester Morin to help divert urine away from perineal wound that has now healed. She has been receiving Qmonthly SPT changes at [facility] office in [place] since 06/2018. Early this AM SPT became inadvertently displaced at home 12/02/2018. Several noble attempts by ER staff to replace unsuccessful. Several attempts at bedside canulation with wire also unsuccesful. Her perineal wound is now resolved, therefore traditional per urethra catheter placed.    Today "Kyrstin" is seen to evaluate displaced SPT.   Past Medical History:  Diagnosis Date  . Chest pain   . Depression   . GERD (gastroesophageal reflux disease)   . Hypertension   . Sleep apnea   . Thyroid disease     Past Surgical History:  Procedure Laterality Date  . APPENDECTOMY  1946  . CATARACT EXTRACTION     bilateral  . GALLBLADDER SURGERY    . INCISION AND DRAINAGE ABSCESS N/A 06/29/2018   Procedure: REPEAT INCISION AND DRAINAGE  BUTTOCK ABSCESS;  Surgeon: Harriette Bouillon, MD;  Location: [facility] OR;  Service: General;  Laterality: N/A;  . INCISION AND DRAINAGE PERIRECTAL ABSCESS Left 06/27/2018   Procedure: IRRIGATION AND DEBRIDEMENT BUTTOCK ABSCESS;  Surgeon: Andria Meuse, MD;  Location: [facility] OR;  Service: General;  Laterality: Left;  . INSERTION OF SUPRAPUBIC CATHETER N/A 07/03/2018   Procedure: INSERTION OF SUPRAPUBIC CATHETER;  Surgeon: Crista Elliot, MD;  Location: [facility] OR;  Service: Urology;  Laterality: N/A;  . KNEE ARTHROPLASTY  2006   left   . KNEE ARTHROPLASTY  2006   right   . LAPAROSCOPIC DIVERTED COLOSTOMY N/A 07/03/2018   Procedure:  LAPAROSCOPIC DIVERTED COLOSTOMY;  Surgeon: Axel Filler, MD;  Location: [facility] OR;  Service: General;  Laterality: N/A;  . SHOULDER ARTHROSCOPY  2009   right  . TOTAL HIP ARTHROPLASTY  2002    left    Family History  Problem Relation Age of Onset  . Diabetes Mother   . Stroke Mother   . Cancer Brother   . Kidney disease Sister     Social History:  reports that she has quit smoking. Her smoking use included cigarettes. She has never used smokeless tobacco. She reports that she does not drink alcohol or use drugs.  Allergies:  Allergies  Allergen Reactions  . Morphine And Related Hives and Itching  . Gabapentin Other (See Comments)    Alters mood  . Lipitor [Atorvastatin] Itching and Rash  . Lyrica [Pregabalin] Other (See Comments)    Lethargic, mood changes  . Sulfa Antibiotics Rash    Medications: I have reviewed the patient's current medications.  No results found for this or any previous visit (from the past 48 hour(s)).  No results found.  Review of Systems  Constitutional: Negative.  Negative for chills and fever.  HENT: Negative.   Eyes: Negative.   Respiratory: Negative.   Cardiovascular: Negative.   Gastrointestinal: Negative.   Genitourinary: Negative.   Musculoskeletal: Negative.   Skin: Negative.   Neurological: Negative.   Endo/Heme/Allergies: Negative.   Psychiatric/Behavioral: Negative.    Blood pressure (!) 115/39, pulse 71, temperature 97.7 F (36.5 C), temperature source Temporal,  resp. rate 16, height 5\' 5"  (1.651 m), weight 97.5 kg, SpO2 94 %. Physical Exam  Constitutional: She is oriented to person, place, and time. She appears well-developed.  Daughter with her at ER bedside.   HENT:  Head: Normocephalic.  Eyes: Pupils are equal, round, and reactive to light.  Neck: Normal range of motion.  Cardiovascular: Normal rate.  Respiratory: Effort normal.  GI:  Moderate obesity.   Genitourinary:    Genitourinary Comments: SPT site with blood  and no urine noted despite pt reported sensation of bladder fullness. Prior nec-fasc resolved with excellent perineal healing.    Neurological: She is alert and oriented to person, place, and time.  Skin: Skin is warm.  Psychiatric: She has a normal mood and affect.   Using aseptic technique, several attempts made to cannulate true SPT tract with sensor wire. One canulation with suspect continuity and single trial of passage of 51F council over the wire UNsuccesfull. Given risk / benefit balance, decision made to place urethral foley which is done with 51F using aseptic technique with immediate efflux of 500mL non-foul urine.   Assessment/Plan:  SPT tract completely closed / false passed. AS her indication for SPT in the first place (perineal wound) has now resolved, safest near term managemtn is per urethral foley cahnge Q month. Also consider eventual trial of void. Consider formal repeat SPT if she does not tolerate either. Pt's daughter facile with catheter care. They have pending Gu appt for tomorrow but will call to reschedule to foley change in 1 month.   Sebastian Acheheodore Jamire Shabazz 12/02/2018, 3:37 PM

## 2018-12-02 NOTE — ED Notes (Signed)
Awaiting urology

## 2018-12-02 NOTE — Discharge Instructions (Addendum)
Follow up with your urologist as discussed with Dr. Berneice Heinrich today.

## 2018-12-02 NOTE — ED Notes (Signed)
Daughter cleaning ostomy sites and suprapubic sites and accidentally cut pt's sup. Catheter  Call to [facility] for Uro cart

## 2018-12-02 NOTE — ED Notes (Signed)
Incontinent of urine   Cleansed    Dr Demetrius Charity has called urologist who is enroute

## 2018-12-02 NOTE — ED Notes (Signed)
JI in to assess 

## 2018-12-02 NOTE — ED Provider Notes (Signed)
[facility] EMERGENCY DEPARTMENT Provider Note   CSN: [id number] Arrival date & time: 12/02/18  1157     History   Chief Complaint Chief Complaint  Patient presents with  . Urinary Retention    HPI [name] is a 85 y.o. female with a past medical history as outlined below, most significant for cellulitis and abscess to her buttock region causing severe necrosis requiring hospitalization this past summer.  Prior to this event patient had urinary incontinence and it was felt that a suprapubic catheter would be the best way of keeping her infected skin protected.  She had this catheter placed in August.  Her daughter was doing her catheter care this morning when she accidentally cut the catheter tubing so she removed the catheter.  This occurred around 11 AM.  She is here today for replacement.  She notes that she is scheduled to have the catheter replaced tomorrow as a routine procedure at [facility] urology in [place].  The history is provided by the patient and a relative.    Past Medical History:  Diagnosis Date  . Chest pain   . Depression   . GERD (gastroesophageal reflux disease)   . Hypertension   . Sleep apnea   . Thyroid disease     Patient Active Problem List   Diagnosis Date Noted  . Skin ulcer of sacrum (HCC)   . Acute gout of left hand   . Acute blood loss anemia   . Acute on chronic diastolic (congestive) heart failure (HCC)   . Sepsis (HCC) 07/11/2018  . Debility   . OSA (obstructive sleep apnea)   . Diastolic dysfunction   . AKI (acute kidney injury) (HCC)   . Post-operative pain   . Tachypnea   . Steroid-induced hyperglycemia   . Leukocytosis   . Pressure injury of skin 06/27/2018  . Cellulitis and abscess of buttock   . Gastrointestinal hemorrhage   . Hypotension 06/26/2018  . ARF (acute renal failure) (HCC) 06/26/2018  . Black stool 06/26/2018  . Anemia 06/26/2018  . Bacteremia 03/07/2016  . Atypical chest pain 03/07/2016  .  Acute-on-chronic kidney injury (HCC) 03/07/2016  . Essential hypertension 03/07/2016  . Chronic diastolic congestive heart failure (HCC) 03/07/2016  . Hypothyroidism 03/07/2016  . Depression with anxiety 03/07/2016  . UTI (lower urinary tract infection)   . Near syncope 08/20/2015  . Morbid obesity (HCC) 08/20/2015  . Pain in the chest 07/06/2015  . Awareness alteration, transient 05/07/2015  . History of stroke 05/07/2015    Past Surgical History:  Procedure Laterality Date  . APPENDECTOMY  1946  . CATARACT EXTRACTION     bilateral  . GALLBLADDER SURGERY    . INCISION AND DRAINAGE ABSCESS N/A 06/29/2018   Procedure: REPEAT INCISION AND DRAINAGE  BUTTOCK ABSCESS;  Surgeon: Harriette Bouillon, MD;  Location: [facility] OR;  Service: General;  Laterality: N/A;  . INCISION AND DRAINAGE PERIRECTAL ABSCESS Left 06/27/2018   Procedure: IRRIGATION AND DEBRIDEMENT BUTTOCK ABSCESS;  Surgeon: Andria Meuse, MD;  Location: [facility] OR;  Service: General;  Laterality: Left;  . INSERTION OF SUPRAPUBIC CATHETER N/A 07/03/2018   Procedure: INSERTION OF SUPRAPUBIC CATHETER;  Surgeon: Crista Elliot, MD;  Location: [facility] OR;  Service: Urology;  Laterality: N/A;  . KNEE ARTHROPLASTY  2006   left   . KNEE ARTHROPLASTY  2006   right   . LAPAROSCOPIC DIVERTED COLOSTOMY N/A 07/03/2018   Procedure: LAPAROSCOPIC DIVERTED COLOSTOMY;  Surgeon: Axel Filler, MD;  Location: [facility]  OR;  Service: General;  Laterality: N/A;  . SHOULDER ARTHROSCOPY  2009   right  . TOTAL HIP ARTHROPLASTY  2002    left     OB History   No obstetric history on file.      Home Medications    Prior to Admission medications   Medication Sig Start Date End Date Taking? Authorizing Provider  acetaminophen (TYLENOL) 325 MG tablet Take 2 tablets (650 mg total) by mouth every 6 (six) hours as needed for mild pain (or Fever >/= 101). 07/19/18   Angiulli, Mcarthur Rossetti, PA-C  albuterol (PROVENTIL HFA;VENTOLIN HFA) 108 (90 Base) MCG/ACT inhaler Inhale  2 puffs into the lungs every 6 (six) hours as needed for wheezing or shortness of breath. 07/19/18   Angiulli, Mcarthur Rossetti, PA-C  allopurinol (ZYLOPRIM) 100 MG tablet Take 1 tablet (100 mg total) by mouth daily. 07/19/18   Angiulli, Mcarthur Rossetti, PA-C  ALPRAZolam Prudy Feeler) 0.25 MG tablet Take 1 tablet (0.25 mg total) by mouth at bedtime. 07/19/18   Angiulli, Mcarthur Rossetti, PA-C  calcium citrate-vitamin D (CITRACAL+D) 315-200 MG-UNIT tablet Take 1 tablet by mouth daily. 07/19/18   Angiulli, Mcarthur Rossetti, PA-C  collagenase (SANTYL) ointment Apply topically 2 (two) times daily. Apply to affected areas 07/19/18   Angiulli, Mcarthur Rossetti, PA-C  furosemide (LASIX) 40 MG tablet Take 1 tablet (40 mg total) by mouth daily. 07/19/18   Angiulli, Mcarthur Rossetti, PA-C  guaiFENesin (MUCINEX) 600 MG 12 hr tablet Take 1 tablet (600 mg total) by mouth 2 (two) times daily as needed for cough or to loosen phlegm. 03/09/16   Alison Murray, MD  levothyroxine (SYNTHROID, LEVOTHROID) 137 MCG tablet Take 1 tablet (137 mcg total) by mouth daily before breakfast. 07/19/18   Angiulli, Mcarthur Rossetti, PA-C  liver oil-zinc oxide (DESITIN) 40 % ointment Apply topically 2 (two) times daily. Apply to affected areas 07/19/18   Angiulli, Mcarthur Rossetti, PA-C  meclizine (ANTIVERT) 12.5 MG tablet Take 12.5 mg by mouth 3 (three) times daily as needed for dizziness.    [provider]  Misc Natural Products (GLUCOSAMINE CHONDROITIN VIT D3) CAPS Take 1 capsule by mouth daily.     [provider]  Multiple Vitamin (MULTIVITAMIN WITH MINERALS) TABS tablet Take 1 tablet by mouth daily.    [provider]  omeprazole (PRILOSEC) 40 MG capsule Take 1 capsule (40 mg total) by mouth daily. 07/19/18   Angiulli, Mcarthur Rossetti, PA-C  sertraline (ZOLOFT) 50 MG tablet Take 1 tablet (50 mg total) by mouth daily. 07/19/18   Angiulli, Mcarthur Rossetti, PA-C  traMADol (ULTRAM) 50 MG tablet Take 1 tablet (50 mg total) by mouth every 6 (six) hours as needed for moderate pain (or Headache  unrelieved by tylenol). Patient not taking: Reported on 08/08/2018 07/19/18   Angiulli, Mcarthur Rossetti, PA-C    Family History Family History  Problem Relation Age of Onset  . Diabetes Mother   . Stroke Mother   . Cancer Brother   . Kidney disease Sister     Social History Social History   Tobacco Use  . Smoking status: Former Smoker    Types: Cigarettes  . Smokeless tobacco: Never Used  Substance Use Topics  . Alcohol use: No    Alcohol/week: 0.0 standard drinks  . Drug use: No     Allergies   Morphine and related; Gabapentin; Lipitor [atorvastatin]; Lyrica [pregabalin]; and Sulfa antibiotics   Review of Systems Review of Systems  Constitutional: Negative for fever.  HENT: Negative for  congestion and sore throat.   Eyes: Negative.   Respiratory: Negative for chest tightness and shortness of breath.   Cardiovascular: Negative for chest pain.  Gastrointestinal: Negative for abdominal pain and nausea.  Genitourinary: Negative for flank pain and pelvic pain.       Per HPI.  Musculoskeletal: Negative for arthralgias, joint swelling and neck pain.  Skin: Negative.  Negative for rash and wound.  Neurological: Negative for dizziness, weakness, light-headedness, numbness and headaches.  Psychiatric/Behavioral: Negative.      Physical Exam Updated Vital Signs BP (!) 115/39 (BP Location: Right Arm)   Pulse 71   Temp 97.7 F (36.5 C) (Temporal)   Resp 16   Ht 5\' 5"  (1.651 m)   Wt 97.5 kg   SpO2 94%   BMI 35.78 kg/m   Physical Exam Vitals signs and nursing note reviewed.  Constitutional:      Appearance: She is well-developed.  HENT:     Head: Normocephalic and atraumatic.  Eyes:     Conjunctiva/sclera: Conjunctivae normal.  Neck:     Musculoskeletal: Normal range of motion.  Cardiovascular:     Rate and Rhythm: Normal rate and regular rhythm.     Heart sounds: Normal heart sounds.  Pulmonary:     Effort: Pulmonary effort is normal.     Breath sounds: Normal  breath sounds. No wheezing.  Abdominal:     General: Bowel sounds are normal.     Palpations: Abdomen is soft.     Tenderness: There is no abdominal tenderness.     Comments: Patient has an apparent full well-healed suprapubic ostomy site.  There is also a colostomy in the right upper quadrant.  Abdomen is soft and nontender.  Musculoskeletal: Normal range of motion.  Skin:    General: Skin is warm and dry.  Neurological:     Mental Status: She is alert.      ED Treatments / Results  Labs (all labs ordered are listed, but only abnormal results are displayed) Labs Reviewed - No data to display  EKG None  Radiology No results found.  Procedures SUPRAPUBIC TUBE PLACEMENT Date/Time: 12/02/2018 2:03 PM Performed by: Burgess AmorIdol, Elvenia Godden, PA-C Authorized by: Burgess AmorIdol, Nyelah Emmerich, PA-C   Consent:    Consent obtained:  Verbal   Consent given by:  Patient   Risks discussed:  Bleeding and pain   Alternatives discussed:  No treatment Procedure details:    Complexity:  Simple   Catheter type:  Foley   Catheter size:  16 Fr   Ultrasound guidance: no     Number of attempts:  3   Aspirate characteristics: No urine obtained. Comments:     Multiple attempts to place the [address] Foley was unsuccessful.  It would be inserted approximately 3 cm before meeting resistance.   (including critical care time)  Medications Ordered in ED Medications - No data to display   Initial Impression / Assessment and Plan / ED Course  I have reviewed the triage vital signs and the nursing notes.  Pertinent labs & imaging results that were available during my care of the patient were reviewed by me and considered in my medical decision making (see chart for details).     Discussed with Dr. Rubin PayorPickering who also attempted to place a Foley, also move down to a [address] which also was not successful.  We discussed patient with Dr. Berneice HeinrichManny from [facility].  He will plan to come [facility] to [facility] this patient.  He has asked she be n.p.o.  Patient was seen by Dr. Berneice HeinrichManny who confirmed that her suprapubic catheter site was completely closed.  He inserted a urethral catheter as her perineal area was healed.  He arranged for office follow-up with patient and family.  Final Clinical Impressions(s) / ED Diagnoses   Final diagnoses:  Suprapubic catheter dysfunction, initial encounter [facility](HCC)    ED Discharge Orders    None       Victoriano Laindol, Cleola Perryman, PA-C 12/02/18 1601    Benjiman CorePickering, Nathan, MD 12/05/18 1451

## 2018-12-02 NOTE — ED Triage Notes (Signed)
Pt family member accidentally cut pts suprapubi catheter. Pt was due to have changed tomorrow at urologist office

## 2018-12-02 NOTE — ED Notes (Signed)
Dr Rubin PayorPickering in to attempt cath insertion

## 2019-01-09 ENCOUNTER — Other Ambulatory Visit: Payer: Self-pay | Admitting: Urology

## 2019-01-30 ENCOUNTER — Ambulatory Visit (INDEPENDENT_AMBULATORY_CARE_PROVIDER_SITE_OTHER): Payer: Medicare Other | Admitting: Urology

## 2019-01-30 DIAGNOSIS — R338 Other retention of urine: Secondary | ICD-10-CM | POA: Diagnosis not present

## 2019-02-06 ENCOUNTER — Encounter (HOSPITAL_COMMUNITY): Payer: Self-pay

## 2019-02-06 ENCOUNTER — Encounter (HOSPITAL_COMMUNITY)
Admission: RE | Admit: 2019-02-06 | Discharge: 2019-02-06 | Disposition: A | Discharge status: Home / Self Care | Payer: Medicare Other | Source: Ambulatory Visit | Attending: Urology | Admitting: Urology

## 2019-02-06 ENCOUNTER — Other Ambulatory Visit: Payer: Self-pay

## 2019-02-06 DIAGNOSIS — Z01812 Encounter for preprocedural laboratory examination: Secondary | ICD-10-CM | POA: Diagnosis present

## 2019-02-06 LAB — BASIC METABOLIC PANEL
Anion gap: 7 (ref 5–15)
BUN: 36 mg/dL — ABNORMAL HIGH (ref 8–23)
CO2: 26 mmol/L (ref 22–32)
Calcium: 8.9 mg/dL (ref 8.9–10.3)
Chloride: 105 mmol/L (ref 98–111)
Creatinine, Ser: 1.57 mg/dL — ABNORMAL HIGH (ref 0.44–1.00)
GFR calc Af Amer: 34 mL/min — ABNORMAL LOW (ref >60)
GFR calc non Af Amer: 29 mL/min — ABNORMAL LOW (ref >60)
Glucose, Bld: 99 mg/dL (ref 70–99)
Potassium: 3.8 mmol/L (ref 3.5–5.1)
Sodium: 138 mmol/L (ref 135–145)

## 2019-02-06 NOTE — Patient Instructions (Signed)
Your procedure is scheduled on: 02/11/2019  [facility] to [facility] at   7:30  AM.  Call this number if you have problems the morning of surgery: [phone]   Remember:   Do not Eat or Drink after midnight   :  Take these medicines the morning of surgery with A SIP OF WATER: Levothyroxine, antivert, Prilosec, and Zoloft   Do not wear jewelry, make-up or nail polish.  Do not wear lotions, powders, or perfumes. You may wear deodorant.  Do not shave 48 hours prior to surgery. Men may shave face and neck.  Do not bring valuables to the hospital.  Contacts, dentures or bridgework may not be worn into surgery.  Leave suitcase in the car. After surgery it may be brought to your room.  For patients admitted to the hospital, checkout time is 11:00 AM the day of discharge.   Patients discharged the day of surgery will not be allowed to drive home.    Special Instructions: Shower using CHG night before surgery and shower the day of surgery use CHG.  Use special wash - you have one bottle of CHG for all showers.  You should use approximately 1/2 of the bottle for each shower.  Suprapubic Catheter Home Guide A suprapubic catheter is a rubber tube used to drain urine from the bladder into a collection bag. The catheter is inserted into the bladder through a small opening in the in the lower abdomen, near the center of the body, above the pubic bone (suprapubic area). There is a tiny balloon filled with germ-free (sterile) water on the end of the catheter that is in the bladder. The balloon helps to keep the catheter in place. Your suprapubic catheter may need to be replaced every 4-6 weeks, or as often as recommended by your health care provider. The collection bag must be emptied every day and cleaned every 2-3 days. The collection bag can be put beside your bed at night and attached to your leg during the day. You may have a large collection bag to use at night and a smaller one to use during the  day. What are the risks?  Urine flow can become blocked. This can happen if the catheter is not working correctly, or if you have a blood clot in your bladder or in the catheter.  Tissue near the catheter may can become irritated and bleed.  Bacteria may get into your bladder and cause a urinary tract infection. How do I change the catheter? Supplies needed  Two pairs of sterile gloves.  Catheter.  Two syringes.  Sterile water.  Sterile cleaning solution.  Lubricant.  Collection bags. Changing the catheter To replace your catheter, take the following steps: 1. Drink plenty of fluids during the hours before you plan to change the catheter. 2. Wash your hands with soap and water. If soap and water are not available, use hand sanitizer. 3. Lie on your back and put on sterile gloves. 4. Clean the skin around the catheter opening using the sterile cleaning solution. 5. Remove the water from the balloon using a syringe. 6. Slowly remove the catheter. ? Do not pull on the catheter if it seems stuck. ? Call your health care provider immediately if you have difficulty removing the catheter. 7. Take off the used gloves, and put on a new pair. 8. Put lubricant on the end of the new catheter that will go into your bladder. 9. Gently slide the catheter through the opening in  your abdomen and into your bladder. 10. Wait for some urine to start flowing through the catheter. When urine starts to flow through the catheter, use a new syringe to fill the balloon with sterile water. 11. Attach the collection bag to the end of the catheter. Make sure the connection is tight. 12. Remove the gloves and wash your hands with soap and water. How do I care for my skin around the catheter? Use a clean washcloth and soapy water to clean the skin around your catheter every day. Pat the area dry with a clean towel.  Do not pull on the catheter.  Do not use ointment or lotion on this area unless told by  your health care provider.  Check your skin around the catheter every day for signs of infection. Check for: ? Redness, swelling, or pain. ? Fluid or blood. ? Warmth. ? Pus or a bad smell. How do I clean and empty the collection bag? Clean the collection bag every 2-3 days, or as often as told by your health care provider. To do this, take the following steps:  Wash your hands with soap and water. If soap and water are not available, use hand sanitizer.  Disconnect the bag from the catheter and immediately attach a new bag to the catheter.  Empty the used bag completely.  Clean the used bag using one of the following methods: ? Rinse the used bag with warm water and soap. ? Fill the bag with water and add 1 tsp of vinegar. Let it sit for about 30 minutes, then empty the bag.  Let the bag dry completely, and put it in a clean plastic bag before storing it. Empty the large collection bag every 8 hours. Empty the small collection bag when it is about ? full. To empty your large or small collection bag, take the following steps:  Always keep the bag below the level of the catheter. This keeps urine from flowing backwards into the catheter.  Hold the bag over the toilet or another container. Turn the valve (spigot) at the bottom of the bag to empty the urine. ? Do not touch the opening of the spigot. ? Do not let the opening touch the toilet or container.  Close the spigot tightly when the bag is empty. What are some general tips?   Always wash your hands before and after caring for your catheter and collection bag. Use a mild, fragrance-free soap. If soap and water are not available, use hand sanitizer.  Clean the catheter with soap and water as often as told by your health care provider.  Always make sure there are no twists or curls (kinks) in the catheter tube.  Always make sure there are no leaks in the catheter or collection bag.  Drink enough fluid to keep your urine clear  or pale yellow.  Do not take baths, swim, or use a hot tub. When should I seek medical care? Seek medical care if:  You leak urine.  You have redness, swelling, or pain around your catheter opening.  You have fluid or blood coming from your catheter opening.  Your catheter opening feels warm to the touch.  You have pus or a bad smell coming from your catheter opening.  You have a fever or chills.  Your urine flow slows down.  Your urine becomes cloudy or smelly. When should I seek immediate medical care? Seek immediate medical care if your catheter comes out, or if you have:  Nausea.  Back pain.  Difficulty changing your catheter.  Blood in your urine.  No urine flow for 1 hour. This information is not intended to replace advice given to you by your health care provider. Make sure you discuss any questions you have with your health care provider. Document Released: 08/02/2011 Document Revised: 07/13/2016 Document Reviewed: 07/28/2015 Elsevier Interactive Patient Education  2019 [employer]  Monitored Anesthesia Care, Care After These instructions provide you with information about caring for yourself after your procedure. Your health care provider may also give you more specific instructions. Your treatment has been planned according to current medical practices, but problems sometimes occur. Call your health care provider if you have any problems or questions after your procedure. What can I expect after the procedure? After your procedure, you may:  Feel sleepy for several hours.  Feel clumsy and have poor balance for several hours.  Feel forgetful about what happened after the procedure.  Have poor judgment for several hours.  Feel nauseous or vomit.  Have a sore throat if you had a breathing tube during the procedure. Follow these instructions at home: For at least 24 hours after the procedure:      Have a responsible adult stay with you. It is  important to have someone help care for you until you are awake and alert.  Rest as needed.  Do not: ? Participate in activities in which you could fall or become injured. ? Drive. ? Use heavy machinery. ? Drink alcohol. ? Take sleeping pills or medicines that cause drowsiness. ? Make important decisions or sign legal documents. ? Take care of children on your own. Eating and drinking  Follow the diet that is recommended by your health care provider.  If you vomit, drink water, juice, or soup when you can drink without vomiting.  Make sure you have little or no nausea before eating solid foods. General instructions  Take over-the-counter and prescription medicines only as told by your health care provider.  If you have sleep apnea, surgery and certain medicines can increase your risk for breathing problems. Follow instructions from your health care provider about wearing your sleep device: ? Anytime you are sleeping, including during daytime naps. ? While taking prescription pain medicines, sleeping medicines, or medicines that make you drowsy.  If you smoke, do not smoke without supervision.  Keep all follow-up visits as told by your health care provider. This is important. Contact a health care provider if:  You keep feeling nauseous or you keep vomiting.  You feel light-headed.  You develop a rash.  You have a fever. Get help right away if:  You have trouble breathing. Summary  For several hours after your procedure, you may feel sleepy and have poor judgment.  Have a responsible adult stay with you for at least 24 hours or until you are awake and alert. This information is not intended to replace advice given to you by your health care provider. Make sure you discuss any questions you have with your health care provider. Document Released: 03/06/2016 Document Revised: 06/30/2017 Document Reviewed: 03/06/2016 Elsevier Interactive Patient Education  2019 [employer].

## 2019-02-11 ENCOUNTER — Ambulatory Visit (HOSPITAL_COMMUNITY): Payer: Medicare Other | Admitting: Anesthesiology

## 2019-02-11 ENCOUNTER — Ambulatory Visit (HOSPITAL_COMMUNITY)
Admission: RE | Admit: 2019-02-11 | Discharge: 2019-02-11 | Disposition: A | Discharge status: Home / Self Care | Payer: Medicare Other | Attending: Urology | Admitting: Urology

## 2019-02-11 ENCOUNTER — Encounter (HOSPITAL_COMMUNITY)
Admission: RE | Disposition: A | Discharge status: Home / Self Care | Payer: Self-pay | Source: Home / Self Care | Attending: Urology

## 2019-02-11 ENCOUNTER — Other Ambulatory Visit: Payer: Self-pay

## 2019-02-11 ENCOUNTER — Encounter (HOSPITAL_COMMUNITY): Payer: Self-pay

## 2019-02-11 DIAGNOSIS — R339 Retention of urine, unspecified: Secondary | ICD-10-CM | POA: Diagnosis present

## 2019-02-11 DIAGNOSIS — Z833 Family history of diabetes mellitus: Secondary | ICD-10-CM | POA: Diagnosis not present

## 2019-02-11 DIAGNOSIS — F329 Major depressive disorder, single episode, unspecified: Secondary | ICD-10-CM | POA: Insufficient documentation

## 2019-02-11 DIAGNOSIS — Z9842 Cataract extraction status, left eye: Secondary | ICD-10-CM | POA: Insufficient documentation

## 2019-02-11 DIAGNOSIS — Z888 Allergy status to other drugs, medicaments and biological substances status: Secondary | ICD-10-CM | POA: Insufficient documentation

## 2019-02-11 DIAGNOSIS — Z885 Allergy status to narcotic agent status: Secondary | ICD-10-CM | POA: Diagnosis not present

## 2019-02-11 DIAGNOSIS — G4730 Sleep apnea, unspecified: Secondary | ICD-10-CM | POA: Insufficient documentation

## 2019-02-11 DIAGNOSIS — Z9841 Cataract extraction status, right eye: Secondary | ICD-10-CM | POA: Diagnosis not present

## 2019-02-11 DIAGNOSIS — Z886 Allergy status to analgesic agent status: Secondary | ICD-10-CM | POA: Insufficient documentation

## 2019-02-11 DIAGNOSIS — Z841 Family history of disorders of kidney and ureter: Secondary | ICD-10-CM | POA: Insufficient documentation

## 2019-02-11 DIAGNOSIS — Z87891 Personal history of nicotine dependence: Secondary | ICD-10-CM | POA: Insufficient documentation

## 2019-02-11 DIAGNOSIS — Z882 Allergy status to sulfonamides status: Secondary | ICD-10-CM | POA: Insufficient documentation

## 2019-02-11 DIAGNOSIS — Z809 Family history of malignant neoplasm, unspecified: Secondary | ICD-10-CM | POA: Diagnosis not present

## 2019-02-11 DIAGNOSIS — K219 Gastro-esophageal reflux disease without esophagitis: Secondary | ICD-10-CM | POA: Insufficient documentation

## 2019-02-11 DIAGNOSIS — E079 Disorder of thyroid, unspecified: Secondary | ICD-10-CM | POA: Diagnosis not present

## 2019-02-11 DIAGNOSIS — Z8673 Personal history of transient ischemic attack (TIA), and cerebral infarction without residual deficits: Secondary | ICD-10-CM

## 2019-02-11 DIAGNOSIS — I10 Essential (primary) hypertension: Secondary | ICD-10-CM | POA: Insufficient documentation

## 2019-02-11 DIAGNOSIS — Z96642 Presence of left artificial hip joint: Secondary | ICD-10-CM | POA: Diagnosis not present

## 2019-02-11 DIAGNOSIS — Z823 Family history of stroke: Secondary | ICD-10-CM | POA: Insufficient documentation

## 2019-02-11 DIAGNOSIS — R338 Other retention of urine: Secondary | ICD-10-CM | POA: Diagnosis not present

## 2019-02-11 HISTORY — PX: INSERTION OF SUPRAPUBIC CATHETER: SHX5870

## 2019-02-11 HISTORY — PX: CYSTOSCOPY: SHX5120

## 2019-02-11 SURGERY — INSERTION, SUPRAPUBIC CATHETER
Anesthesia: General

## 2019-02-11 MED ORDER — CEFAZOLIN SODIUM-DEXTROSE 2-4 GM/100ML-% IV SOLN
INTRAVENOUS | Status: AC
  Filled 2019-02-11: qty 100

## 2019-02-11 MED ORDER — CEFAZOLIN SODIUM-DEXTROSE 2-4 GM/100ML-% IV SOLN
2.0000 g | INTRAVENOUS | Status: AC
  Administered 2019-02-11: 2 g via INTRAVENOUS

## 2019-02-11 MED ORDER — LIDOCAINE HCL (PF) 1 % IJ SOLN
INTRAMUSCULAR | Status: AC
  Filled 2019-02-11: qty 30

## 2019-02-11 MED ORDER — PROPOFOL 10 MG/ML IV BOLUS
INTRAVENOUS | Status: AC
  Filled 2019-02-11: qty 40

## 2019-02-11 MED ORDER — FENTANYL CITRATE (PF) 100 MCG/2ML IJ SOLN
INTRAMUSCULAR | Status: DC | PRN
  Administered 2019-02-11: 25 ug via INTRAVENOUS

## 2019-02-11 MED ORDER — WATER FOR IRRIGATION, STERILE IR SOLN
Status: DC | PRN
  Administered 2019-02-11: 3000 mL via SURGICAL_CAVITY

## 2019-02-11 MED ORDER — PROPOFOL 10 MG/ML IV BOLUS
INTRAVENOUS | Status: AC
  Filled 2019-02-11: qty 20

## 2019-02-11 MED ORDER — PROPOFOL 10 MG/ML IV BOLUS
INTRAVENOUS | Status: DC | PRN
  Administered 2019-02-11: 20 mg via INTRAVENOUS
  Administered 2019-02-11: 150 mg via INTRAVENOUS
  Administered 2019-02-11: 10 mg via INTRAVENOUS

## 2019-02-11 MED ORDER — FENTANYL CITRATE (PF) 100 MCG/2ML IJ SOLN
INTRAMUSCULAR | Status: AC
  Filled 2019-02-11: qty 2

## 2019-02-11 MED ORDER — MIDAZOLAM HCL 2 MG/2ML IJ SOLN: 0.5000 mg | Freq: Once | INTRAMUSCULAR | Status: DC | PRN

## 2019-02-11 MED ORDER — PROMETHAZINE HCL 25 MG/ML IJ SOLN: 6.2500 mg | INTRAMUSCULAR | Status: DC | PRN

## 2019-02-11 MED ORDER — BACITRACIN 500 UNIT/GM EX OINT
TOPICAL_OINTMENT | Freq: Two times a day (BID) | CUTANEOUS | Status: DC
  Filled 2019-02-11: qty 28.35

## 2019-02-11 MED ORDER — STERILE WATER FOR IRRIGATION IR SOLN
Status: DC | PRN
  Administered 2019-02-11: 1000 mL

## 2019-02-11 MED ORDER — LACTATED RINGERS IV SOLN
INTRAVENOUS | Status: DC
  Administered 2019-02-11: 50 mL/h via INTRAVENOUS

## 2019-02-11 SURGICAL SUPPLY — 32 items
BAG URINE DRAINAGE (UROLOGICAL SUPPLIES) ×6 IMPLANT
BLADE SURG 15 STRL LF DISP TIS (BLADE) ×1 IMPLANT
BLADE SURG 15 STRL SS (BLADE) ×3
CATH FOLEY 2WAY SLVR  5CC 18FR (CATHETERS) ×2
CATH FOLEY 2WAY SLVR 5CC 18FR (CATHETERS) ×1 IMPLANT
CLOTH BEACON ORANGE TIMEOUT ST (SAFETY) ×3 IMPLANT
COVER LIGHT HANDLE STERIS (MISCELLANEOUS) ×3 IMPLANT
DECANTER SPIKE VIAL GLASS SM (MISCELLANEOUS) ×3 IMPLANT
ELECT REM PT RETURN 9FT ADLT (ELECTROSURGICAL) ×3
ELECTRODE REM PT RTRN 9FT ADLT (ELECTROSURGICAL) ×1 IMPLANT
GAUZE 4X4 16PLY RFD (DISPOSABLE) ×2 IMPLANT
GLOVE BIO SURGEON STRL SZ8 (GLOVE) ×3 IMPLANT
GLOVE BIOGEL PI IND STRL 7.0 (GLOVE) ×2 IMPLANT
GLOVE BIOGEL PI INDICATOR 7.0 (GLOVE) ×4
GOWN STRL REUS W/ TWL XL LVL3 (GOWN DISPOSABLE) ×1 IMPLANT
GOWN STRL REUS W/TWL LRG LVL3 (GOWN DISPOSABLE) ×3 IMPLANT
GOWN STRL REUS W/TWL XL LVL3 (GOWN DISPOSABLE) ×3
KIT BLADEGUARD II DBL (SET/KITS/TRAYS/PACK) ×3 IMPLANT
KIT SURGICAL DEVON (SET/KITS/TRAYS/PACK) ×3 IMPLANT
KIT TURNOVER CYSTO (KITS) ×3 IMPLANT
MANIFOLD NEPTUNE II (INSTRUMENTS) ×3 IMPLANT
NDL HYPO 25X1 1.5 SAFETY (NEEDLE) ×1 IMPLANT
NEEDLE HYPO 25X1 1.5 SAFETY (NEEDLE) ×3 IMPLANT
PACK CYSTO (CUSTOM PROCEDURE TRAY) ×3 IMPLANT
PAD ARMBOARD 7.5X6 YLW CONV (MISCELLANEOUS) ×3 IMPLANT
SPONGE DRAIN TRACH 4X4 STRL 2S (GAUZE/BANDAGES/DRESSINGS) ×3 IMPLANT
SUT SILK 0 FSL (SUTURE) ×5 IMPLANT
SYR CONTROL 10ML LL (SYRINGE) ×3 IMPLANT
TAPE CLOTH SURG 4X10 WHT LF (GAUZE/BANDAGES/DRESSINGS) ×2 IMPLANT
TOWEL OR 17X26 4PK STRL BLUE (TOWEL DISPOSABLE) ×3 IMPLANT
WATER STERILE IRR 3000ML UROMA (IV SOLUTION) ×3 IMPLANT
WATER STERILE IRR 500ML POUR (IV SOLUTION) ×3 IMPLANT

## 2019-02-11 NOTE — Anesthesia Procedure Notes (Signed)
Procedure Name: LMA Insertion Date/Time: 02/11/2019 9:41 AM Performed by: Franco Nones, CRNA Pre-anesthesia Checklist: Patient identified, Patient being monitored, Emergency Drugs available, Timeout performed and Suction available Patient Re-evaluated:Patient Re-evaluated prior to induction Oxygen Delivery Method: Circle System Utilized Preoxygenation: Pre-oxygenation with 100% oxygen Induction Type: IV induction Ventilation: Mask ventilation without difficulty LMA: LMA inserted LMA Size: 4.0 Number of attempts: 1 Placement Confirmation: positive ETCO2 and breath sounds checked- equal and bilateral Tube secured with: Tape Dental Injury: Teeth and Oropharynx as per pre-operative assessment

## 2019-02-11 NOTE — Anesthesia Postprocedure Evaluation (Signed)
Anesthesia Post Note  Patient: [name]  Procedure(s) Performed: INSERTION OF SUPRAPUBIC CATHETER (N/A ) CYSTOSCOPY (N/A )  Patient location during evaluation: PACU Anesthesia Type: General Level of consciousness: awake and alert and patient cooperative Pain management: satisfactory to patient Vital Signs Assessment: post-procedure vital signs reviewed and stable Respiratory status: spontaneous breathing Cardiovascular status: stable Postop Assessment: no apparent nausea or vomiting Anesthetic complications: no     Last Vitals:  Vitals:   02/11/19 1030 02/11/19 1045  BP: (!) 116/48 135/61  Pulse: (!) 57 68  Resp: (!) 24 (!) 9  Temp:    SpO2: 93% 92%    Last Pain:  Vitals:   02/11/19 1045  TempSrc:   PainSc: 0-No pain                 Parthenia Tellefsen

## 2019-02-11 NOTE — Anesthesia Preprocedure Evaluation (Signed)
Anesthesia Evaluation  Patient identified by MRN, date of birth, ID band Patient awake    Reviewed: Allergy & Precautions, NPO status , Patient's Chart, lab work & pertinent test results  Airway Mallampati: II  TM Distance: >3 FB Neck ROM: Full    Dental no notable dental hx. (+) Edentulous Upper, Edentulous Lower   Pulmonary sleep apnea and Continuous Positive Airway Pressure Ventilation , former smoker,    Pulmonary exam normal breath sounds clear to auscultation       Cardiovascular Exercise Tolerance: Poor hypertension, Pt. on medications +CHF  Normal cardiovascular examII Rhythm:Regular Rate:Normal     Neuro/Psych Anxiety Depression negative neurological ROS  negative psych ROS   GI/Hepatic Neg liver ROS, GERD  Medicated and Controlled,  Endo/Other  negative endocrine ROS  Renal/GU Renal disease  negative genitourinary   Musculoskeletal  (+) Arthritis , Osteoarthritis,    Abdominal   Peds negative pediatric ROS (+)  Hematology negative hematology ROS (+) anemia ,   Anesthesia Other Findings   Reproductive/Obstetrics negative OB ROS                             Anesthesia Physical Anesthesia Plan  ASA: III  Anesthesia Plan: General   Post-op Pain Management:    Induction: Intravenous  PONV Risk Score and Plan:   Airway Management Planned: LMA  Additional Equipment:   Intra-op Plan:   Post-operative Plan: Extubation in OR  Informed Consent: I have reviewed the patients History and Physical, chart, labs and discussed the procedure including the risks, benefits and alternatives for the proposed anesthesia with the patient or authorized representative who has indicated his/her understanding and acceptance.     Dental advisory given  Plan Discussed with: CRNA  Anesthesia Plan Comments:         Anesthesia Quick Evaluation

## 2019-02-11 NOTE — Transfer of Care (Signed)
Immediate Anesthesia Transfer of Care Note  Patient: [name]  Procedure(s) Performed: INSERTION OF SUPRAPUBIC CATHETER (N/A ) CYSTOSCOPY (N/A )  Patient Location: PACU  Anesthesia Type:General  Level of Consciousness: awake and patient cooperative  Airway & Oxygen Therapy: Patient Spontanous Breathing and non-rebreather face mask  Post-op Assessment: Report given to RN and Post -op Vital signs reviewed and stable  Post vital signs: Reviewed and stable  Last Vitals:  Vitals Value Taken Time  BP    Temp    Pulse 82 02/11/2019 10:22 AM  Resp    SpO2 92 % 02/11/2019 10:22 AM  Vitals shown include unvalidated device data.  Last Pain:  Vitals:   02/11/19 0755  TempSrc: Oral         Complications: No apparent anesthesia complications

## 2019-02-11 NOTE — Discharge Instructions (Signed)
Suprapubic Catheter Home Guide A suprapubic catheter is a rubber tube used to drain urine from the bladder into a collection bag. The catheter is inserted into the bladder through a small opening in the in the lower abdomen, near the center of the body, above the pubic bone (suprapubic area). There is a tiny balloon filled with germ-free (sterile) water on the end of the catheter that is in the bladder. The balloon helps to keep the catheter in place. Your suprapubic catheter may need to be replaced every 4-6 weeks, or as often as recommended by your health care provider. The collection bag must be emptied every day and cleaned every 2-3 days. The collection bag can be put beside your bed at night and attached to your leg during the day. You may have a large collection bag to use at night and a smaller one to use during the day. What are the risks?  Urine flow can become blocked. This can happen if the catheter is not working correctly, or if you have a blood clot in your bladder or in the catheter.  Tissue near the catheter may can become irritated and bleed.  Bacteria may get into your bladder and cause a urinary tract infection. How do I change the catheter? Supplies needed  Two pairs of sterile gloves.  Catheter.  Two syringes.  Sterile water.  Sterile cleaning solution.  Lubricant.  Collection bags. Changing the catheter To replace your catheter, take the following steps: 1. Drink plenty of fluids during the hours before you plan to change the catheter. 2. Wash your hands with soap and water. If soap and water are not available, use hand sanitizer. 3. Lie on your back and put on sterile gloves. 4. Clean the skin around the catheter opening using the sterile cleaning solution. 5. Remove the water from the balloon using a syringe. 6. Slowly remove the catheter. ? Do not pull on the catheter if it seems stuck. ? Call your health care provider immediately if you have difficulty  removing the catheter. 7. Take off the used gloves, and put on a new pair. 8. Put lubricant on the end of the new catheter that will go into your bladder. 9. Gently slide the catheter through the opening in your abdomen and into your bladder. 10. Wait for some urine to start flowing through the catheter. When urine starts to flow through the catheter, use a new syringe to fill the balloon with sterile water. 11. Attach the collection bag to the end of the catheter. Make sure the connection is tight. 12. Remove the gloves and wash your hands with soap and water. How do I care for my skin around the catheter? Use a clean washcloth and soapy water to clean the skin around your catheter every day. Pat the area dry with a clean towel.  Do not pull on the catheter.  Do not use ointment or lotion on this area unless told by your health care provider.  Check your skin around the catheter every day for signs of infection. Check for: ? Redness, swelling, or pain. ? Fluid or blood. ? Warmth. ? Pus or a bad smell. How do I clean and empty the collection bag? Clean the collection bag every 2-3 days, or as often as told by your health care provider. To do this, take the following steps:  Wash your hands with soap and water. If soap and water are not available, use hand sanitizer.  Disconnect the bag from the  catheter and immediately attach a new bag to the catheter.  Empty the used bag completely.  Clean the used bag using one of the following methods: ? Rinse the used bag with warm water and soap. ? Fill the bag with water and add 1 tsp of vinegar. Let it sit for about 30 minutes, then empty the bag.  Let the bag dry completely, and put it in a clean plastic bag before storing it. Empty the large collection bag every 8 hours. Empty the small collection bag when it is about ? full. To empty your large or small collection bag, take the following steps:  Always keep the bag below the level of the  catheter. This keeps urine from flowing backwards into the catheter.  Hold the bag over the toilet or another container. Turn the valve (spigot) at the bottom of the bag to empty the urine. ? Do not touch the opening of the spigot. ? Do not let the opening touch the toilet or container.  Close the spigot tightly when the bag is empty. What are some general tips?   Always wash your hands before and after caring for your catheter and collection bag. Use a mild, fragrance-free soap. If soap and water are not available, use hand sanitizer.  Clean the catheter with soap and water as often as told by your health care provider.  Always make sure there are no twists or curls (kinks) in the catheter tube.  Always make sure there are no leaks in the catheter or collection bag.  Drink enough fluid to keep your urine clear or pale yellow.  Do not take baths, swim, or use a hot tub. When should I seek medical care? Seek medical care if:  You leak urine.  You have redness, swelling, or pain around your catheter opening.  You have fluid or blood coming from your catheter opening.  Your catheter opening feels warm to the touch.  You have pus or a bad smell coming from your catheter opening.  You have a fever or chills.  Your urine flow slows down.  Your urine becomes cloudy or smelly. When should I seek immediate medical care? Seek immediate medical care if your catheter comes out, or if you have:  Nausea.  Back pain.  Difficulty changing your catheter.  Blood in your urine.  No urine flow for 1 hour. This information is not intended to replace advice given to you by your health care provider. Make sure you discuss any questions you have with your health care provider. Document Released: 08/02/2011 Document Revised: 07/13/2016 Document Reviewed: 07/28/2015 Elsevier Interactive Patient Education  2019 [employer]  General Anesthesia, Adult, Care After This sheet gives you  information about how to care for yourself after your procedure. Your health care provider may also give you more specific instructions. If you have problems or questions, contact your health care provider. What can I expect after the procedure? After the procedure, the following side effects are common:  Pain or discomfort at the IV site.  Nausea.  Vomiting.  Sore throat.  Trouble concentrating.  Feeling cold or chills.  Weak or tired.  Sleepiness and fatigue.  Soreness and body aches. These side effects can affect parts of the body that were not involved in surgery. Follow these instructions at home:  For at least 24 hours after the procedure:  Have a responsible adult stay with you. It is important to have someone help care for you until you are awake  and alert.  Rest as needed.  Do not: ? Participate in activities in which you could fall or become injured. ? Drive. ? Use heavy machinery. ? Drink alcohol. ? Take sleeping pills or medicines that cause drowsiness. ? Make important decisions or sign legal documents. ? Take care of children on your own. Eating and drinking  Follow any instructions from your health care provider about eating or drinking restrictions.  When you feel hungry, start by eating small amounts of foods that are soft and easy to digest (bland), such as toast. Gradually return to your regular diet.  Drink enough fluid to keep your urine pale yellow.  If you vomit, rehydrate by drinking water, juice, or clear broth. General instructions  If you have sleep apnea, surgery and certain medicines can increase your risk for breathing problems. Follow instructions from your health care provider about wearing your sleep device: ? Anytime you are sleeping, including during daytime naps. ? While taking prescription pain medicines, sleeping medicines, or medicines that make you drowsy.  Return to your normal activities as told by your health care  provider. Ask your health care provider what activities are safe for you.  Take over-the-counter and prescription medicines only as told by your health care provider.  If you smoke, do not smoke without supervision.  Keep all follow-up visits as told by your health care provider. This is important. Contact a health care provider if:  You have nausea or vomiting that does not get better with medicine.  You cannot eat or drink without vomiting.  You have pain that does not get better with medicine.  You are unable to pass urine.  You develop a skin rash.  You have a fever.  You have redness around your IV site that gets worse. Get help right away if:  You have difficulty breathing.  You have chest pain.  You have blood in your urine or stool, or you vomit blood. Summary  After the procedure, it is common to have a sore throat or nausea. It is also common to feel tired.  Have a responsible adult stay with you for the first 24 hours after general anesthesia. It is important to have someone help care for you until you are awake and alert.  When you feel hungry, start by eating small amounts of foods that are soft and easy to digest (bland), such as toast. Gradually return to your regular diet.  Drink enough fluid to keep your urine pale yellow.  Return to your normal activities as told by your health care provider. Ask your health care provider what activities are safe for you. This information is not intended to replace advice given to you by your health care provider. Make sure you discuss any questions you have with your health care provider. Document Released: 02/20/2001 Document Revised: 06/30/2017 Document Reviewed: 06/30/2017 Elsevier Interactive Patient Education  2019 [employer].

## 2019-02-11 NOTE — Op Note (Signed)
Preoperative diagnosis:  urinary retention  Postop diagnosis: Same  Procedure: 1. Cystoscopy 2. Suprapubic tube placement  Attending: [name]  Anesthesia: General  Estimated blood loss: 5 cc  Drains: 1. 16 French Foley catheter 2. 22 French SP tube  Specimens: none  Antibiotics: ancef  Findings:  mild bladder trabeculations. No masses/lesions in the bladder.  Indications: Patient is a 85 year old with a history of urinary retention requiring chronic foley catheter. The patient wishes to proceed with SP tube placement.  After discussing treatment options patient decided to proceed with SP tube placement.  Procedure in detail: Prior to procedure consetn was obtained. Patient was brought to the operating room and briefing was done sure correct patient, correct procedure, correct site. General anesthesia was in administered patient was placed in the dorsal lithotomy position. The rigid 22 French cystoscope was passed urethra and bladder. Bladder was inspected masses or lesions and none were found. We then filled the bladder to approximately 500cc until it was easily palpable on the abdominal wall. We then made a 1cm incision 2cm above the pubic symphysis. Using the Lousely suprapubic tube introducer through the urethra we brought the introducer through the suprapubic incision. We placed a 22 french foley through the introducer and into the bladder. We then filled the balloon with 20cc of water. The SP tube was secured with a 0 silk to the skin. bladder was then drained and a 16 French Foley catheter was placed. This concluded the procedure which resulted by the patient.  Complications: None  Condition: Stable, extubated, transferred to PACU.  Plan: Patient is to be discharged home. She is to followup in 2-5 days for foley catheter removal

## 2019-02-11 NOTE — H&P (Signed)
Urology Admission H&P  Chief Complaint: urinary retention  History of Present Illness: [name] is a 85yo with a history of urinary retention managed with an indwelling SP tube which became dislodged 2 months ago. She is now managed with an indwelling foley.  Past Medical History:  Diagnosis Date  . Chest pain   . Depression   . GERD (gastroesophageal reflux disease)   . Hypertension   . Sleep apnea   . Thyroid disease    Past Surgical History:  Procedure Laterality Date  . APPENDECTOMY  1946  . CATARACT EXTRACTION     bilateral  . GALLBLADDER SURGERY    . INCISION AND DRAINAGE ABSCESS N/A 06/29/2018   Procedure: REPEAT INCISION AND DRAINAGE  BUTTOCK ABSCESS;  Surgeon: Harriette Bouillon, MD;  Location: [facility] OR;  Service: General;  Laterality: N/A;  . INCISION AND DRAINAGE PERIRECTAL ABSCESS Left 06/27/2018   Procedure: IRRIGATION AND DEBRIDEMENT BUTTOCK ABSCESS;  Surgeon: Andria Meuse, MD;  Location: [facility] OR;  Service: General;  Laterality: Left;  . INSERTION OF SUPRAPUBIC CATHETER N/A 07/03/2018   Procedure: INSERTION OF SUPRAPUBIC CATHETER;  Surgeon: Crista Elliot, MD;  Location: [facility] OR;  Service: Urology;  Laterality: N/A;  . KNEE ARTHROPLASTY  2006   left   . KNEE ARTHROPLASTY  2006   right   . LAPAROSCOPIC DIVERTED COLOSTOMY N/A 07/03/2018   Procedure: LAPAROSCOPIC DIVERTED COLOSTOMY;  Surgeon: Axel Filler, MD;  Location: [facility] OR;  Service: General;  Laterality: N/A;  . SHOULDER ARTHROSCOPY  2009   right  . TOOTH EXTRACTION    . TOTAL HIP ARTHROPLASTY  2002    left    Home Medications:  Current Facility-Administered Medications  Medication Dose Route Frequency Provider Last Rate Last Dose  . ceFAZolin (ANCEF) IVPB 2g/100 mL premix  2 g Intravenous 30 min Pre-Op Anyeli Hockenbury, Mardene Celeste, MD       Allergies:  Allergies  Allergen Reactions  . Morphine And Related Hives and Itching  . Tramadol Other (See Comments)    vertigo  . Codeine Anxiety    Very agitated then  unable to arouse  . Gabapentin Other (See Comments)    Alters mood  . Lipitor [Atorvastatin] Itching and Rash  . Lyrica [Pregabalin] Other (See Comments)    Lethargic, mood changes  . Motrin [Ibuprofen] Anxiety    Agitated   . Sulfa Antibiotics Rash  . Tylenol [Acetaminophen]     Family History  Problem Relation Age of Onset  . Diabetes Mother   . Stroke Mother   . Cancer Brother   . Kidney disease Sister    Social History:  reports that she has quit smoking. Her smoking use included cigarettes. She has never used smokeless tobacco. She reports that she does not drink alcohol or use drugs.  Review of Systems  All other systems reviewed and are negative.   Physical Exam:  Vital signs in last 24 hours: Temp:  [97.9 F (36.6 C)] 97.9 F (36.6 C) (03/16 0755) Pulse Rate:  [73] 73 (03/16 0755) Resp:  [19] 19 (03/16 0755) BP: (136)/(63) 136/63 (03/16 0818) SpO2:  [90 %] 90 % (03/16 0755) Physical Exam  Constitutional: She is oriented to person, place, and time. She appears well-developed and well-nourished.  HENT:  Head: Normocephalic and atraumatic.  Eyes: Pupils are equal, round, and reactive to light. EOM are normal.  Neck: Normal range of motion. No thyromegaly present.  Cardiovascular: Normal rate and regular rhythm.  Respiratory: Effort normal. No respiratory  distress.  GI: Soft. She exhibits no distension.  Musculoskeletal: Normal range of motion.        General: No edema.  Neurological: She is alert and oriented to person, place, and time.  Skin: Skin is warm and dry.  Psychiatric: She has a normal mood and affect. Her behavior is normal. Judgment and thought content normal.    Laboratory Data:  No results found for this or any previous visit (from the past 24 hour(s)). No results found for this or any previous visit (from the past 240 hour(s)). Creatinine: Recent Labs    02/06/19 1247  CREATININE 1.57*   Baseline Creatinine:  unknown  Impression/Assessment:  85yo with urinary retention  Plan:  The risks/benefits/alternatives to Sp tube placement was explained to the patient and she understands and wishes to proceed with surgery  [name] 02/11/2019, 9:13 AM

## 2019-02-12 ENCOUNTER — Encounter (HOSPITAL_COMMUNITY): Payer: Self-pay | Admitting: Urology

## 2019-02-13 ENCOUNTER — Ambulatory Visit (INDEPENDENT_AMBULATORY_CARE_PROVIDER_SITE_OTHER): Payer: Medicare Other | Admitting: Urology

## 2019-02-13 DIAGNOSIS — R338 Other retention of urine: Secondary | ICD-10-CM

## 2019-03-20 ENCOUNTER — Ambulatory Visit (INDEPENDENT_AMBULATORY_CARE_PROVIDER_SITE_OTHER): Payer: Medicare Other | Admitting: Urology

## 2019-03-20 DIAGNOSIS — R338 Other retention of urine: Secondary | ICD-10-CM | POA: Diagnosis not present

## 2019-04-24 ENCOUNTER — Ambulatory Visit (INDEPENDENT_AMBULATORY_CARE_PROVIDER_SITE_OTHER): Payer: Medicare Other | Admitting: Urology

## 2019-04-24 DIAGNOSIS — R338 Other retention of urine: Secondary | ICD-10-CM

## 2019-05-13 ENCOUNTER — Telehealth: Payer: Self-pay | Admitting: *Deleted

## 2019-05-13 NOTE — Telephone Encounter (Signed)
Called daughter, Hassan Rowan and advised her that Due to current COVID 19 pandemic, our office is severely reducing in person visits in order to minimize the risk to our patients and healthcare providers. We recommend to convert her mother's  appointment to a video visit. Hassan Rowan stated patient is living with daughter, [name] and she advised I call her. I called [name] and advised Due to current COVID 19 pandemic, our office is severely reducing in person visits in order to minimize the risk to our patients and healthcare providers. We recommend to convert  appointment to a video visit. She stated they do not have internet or capability. I advised her we need to reschedule; we rescheduled. I explained in office check in procedure. She verbalized understanding, appreciation.

## 2019-05-15 ENCOUNTER — Ambulatory Visit: Payer: Medicare Other | Admitting: Diagnostic Neuroimaging

## 2019-05-21 ENCOUNTER — Ambulatory Visit (INDEPENDENT_AMBULATORY_CARE_PROVIDER_SITE_OTHER): Payer: Medicare Other | Admitting: Urology

## 2019-05-21 DIAGNOSIS — R338 Other retention of urine: Secondary | ICD-10-CM

## 2019-05-28 ENCOUNTER — Encounter: Payer: Self-pay | Admitting: Diagnostic Neuroimaging

## 2019-05-28 ENCOUNTER — Ambulatory Visit: Payer: Medicare Other | Admitting: Diagnostic Neuroimaging

## 2019-05-28 ENCOUNTER — Encounter

## 2019-05-28 ENCOUNTER — Other Ambulatory Visit: Payer: Self-pay

## 2019-05-28 VITALS — BP 160/75 | HR 80 | Temp 98.6°F

## 2019-05-28 DIAGNOSIS — G500 Trigeminal neuralgia: Secondary | ICD-10-CM

## 2019-05-28 MED ORDER — CARBAMAZEPINE 100 MG PO CHEW: 100.0000 mg | CHEWABLE_TABLET | Freq: Two times a day (BID) | ORAL | 12 refills | Status: DC

## 2019-05-28 NOTE — Patient Instructions (Signed)
-   check MRI brain  - start carbamazepine 100mg  at bedtime; increase to 100mg  twice a day after 2-3 days

## 2019-05-28 NOTE — Progress Notes (Signed)
[facility]  PATIENT: Rebecca KayGracie B Hynek DOB: 10/14/1928  REFERRING CLINICIAN: S Best HISTORY FROM: patient and daughter  REASON FOR VISIT: new consult    HISTORICAL  CHIEF COMPLAINT:  Chief Complaint  Patient presents with  . Pain    rm 6 New pt, dgtr- Cindy    HISTORY OF PRESENT ILLNESS:   85 year old female here for evaluation of right jaw / mouth pain.  Summer 2019 patient had pain in her right lower alveolar ridge, near her partial denture.  Patient thought she had pain in her tooth.  She went to her dentist who did a #29 extraction in the lower right mandible on 10/17/2018.  Unfortunately pain did not improve and in fact worsened.  Now patient having intermittent lightening shocklike pain like a nail driving through her gum.  This happens randomly and without provocation.  Walking and chewing exacerbates.  Patient also has daily baseline pain.  Patient tried Tylenol with codeine but could not take this because of her side effect profile.  She also reports side effects to tramadol, gabapentin, Lyrica.  She has allergic reaction with hives with morphine.  Tramadol causes vertigo.   REVIEW OF SYSTEMS: Full 14 system review of systems performed and negative with exception of: Swelling in legs hearing loss joint pain depression incontinence chest pain.  ALLERGIES: Allergies  Allergen Reactions  . Morphine And Related Hives and Itching  . Tramadol Other (See Comments)    vertigo  . Codeine Anxiety    Very agitated then unable to arouse  . Gabapentin Other (See Comments)    Alters mood  . Lipitor [Atorvastatin] Itching and Rash  . Lyrica [Pregabalin] Other (See Comments)    Lethargic, mood changes  . Motrin [Ibuprofen] Anxiety    Agitated   . Sulfa Antibiotics Rash  . Tylenol [Acetaminophen]     HOME MEDICATIONS: Outpatient Medications Prior to Visit  Medication Sig Dispense Refill  . acetaminophen (TYLENOL) 325 MG tablet Take 2 tablets (650 mg  total) by mouth every 6 (six) hours as needed for mild pain (or Fever >/= 101).    Marland Kitchen. albuterol (PROVENTIL HFA;VENTOLIN HFA) 108 (90 Base) MCG/ACT inhaler Inhale 2 puffs into the lungs every 6 (six) hours as needed for wheezing or shortness of breath. 1 Inhaler 1  . allopurinol (ZYLOPRIM) 100 MG tablet Take 1 tablet (100 mg total) by mouth daily. (Patient taking differently: Take 100 mg by mouth 2 (two) times daily. ) 30 tablet 0  . ALPRAZolam (XANAX) 0.5 MG tablet Take 0.5 mg by mouth at bedtime.    Marland Kitchen. aspirin EC 81 MG tablet Take 81 mg by mouth daily.    . calcium citrate-vitamin D (CITRACAL+D) 315-200 MG-UNIT tablet Take 1 tablet by mouth daily. 30 tablet 0  . furosemide (LASIX) 40 MG tablet Take 1 tablet (40 mg total) by mouth daily. (Patient taking differently: Take 40 mg by mouth 2 (two) times daily. ) 30 tablet 0  . guaiFENesin (MUCINEX) 600 MG 12 hr tablet Take 1 tablet (600 mg total) by mouth 2 (two) times daily as needed for cough or to loosen phlegm. 45 tablet 0  . levothyroxine (SYNTHROID, LEVOTHROID) 137 MCG tablet Take 1 tablet (137 mcg total) by mouth daily before breakfast. 30 tablet 0  . Multiple Vitamin (MULTIVITAMIN WITH MINERALS) TABS tablet Take 1 tablet by mouth daily.    Marland Kitchen. omeprazole (PRILOSEC) 40 MG capsule Take 1 capsule (40 mg total) by mouth daily. (Patient taking differently: Take 40 mg  by mouth every other day. ) 30 capsule 0  . sertraline (ZOLOFT) 50 MG tablet Take 1 tablet (50 mg total) by mouth daily. 30 tablet 0  . traZODone (DESYREL) 50 MG tablet Take 50 mg by mouth at bedtime.    . meclizine (ANTIVERT) 12.5 MG tablet Take 12.5 mg by mouth 3 (three) times daily as needed for dizziness.    . nitroGLYCERIN (NITROSTAT) 0.4 MG SL tablet     . ALPRAZolam (XANAX) 0.25 MG tablet Take 1 tablet (0.25 mg total) by mouth at bedtime. 30 tablet 0   No facility-administered medications prior to visit.     PAST MEDICAL HISTORY: Past Medical History:  Diagnosis Date  . Chest  pain   . Depression   . GERD (gastroesophageal reflux disease)   . Hypertension   . Sleep apnea   . Thyroid disease     PAST SURGICAL HISTORY: Past Surgical History:  Procedure Laterality Date  . APPENDECTOMY  1946  . CATARACT EXTRACTION     bilateral  . CYSTOSCOPY N/A 02/11/2019   Procedure: CYSTOSCOPY;  Surgeon: Malen GauzeMcKenzie, Patrick L, MD;  Location: [facility];  Service: Urology;  Laterality: N/A;  . GALLBLADDER SURGERY    . INCISION AND DRAINAGE ABSCESS N/A 06/29/2018   Procedure: REPEAT INCISION AND DRAINAGE  BUTTOCK ABSCESS;  Surgeon: Harriette Bouillonornett, Thomas, MD;  Location: [facility];  Service: General;  Laterality: N/A;  . INCISION AND DRAINAGE PERIRECTAL ABSCESS Left 06/27/2018   Procedure: IRRIGATION AND DEBRIDEMENT BUTTOCK ABSCESS;  Surgeon: Andria MeuseWhite, Christopher M, MD;  Location: [facility];  Service: General;  Laterality: Left;  . INSERTION OF SUPRAPUBIC CATHETER N/A 07/03/2018   Procedure: INSERTION OF SUPRAPUBIC CATHETER;  Surgeon: Crista ElliotBell, Eugene D III, MD;  [facility];  Service: Urology;  Laterality: N/A;  . INSERTION OF SUPRAPUBIC CATHETER N/A 02/11/2019   Procedure: INSERTION OF SUPRAPUBIC CATHETER;  Surgeon: Malen GauzeMcKenzie, Patrick L, MD;  Location: [facility];  Service: Urology;  Laterality: N/A;  30 MINS  . KNEE ARTHROPLASTY  2006   left   . KNEE ARTHROPLASTY  2006   right   . LAPAROSCOPIC DIVERTED COLOSTOMY N/A 07/03/2018   Procedure: LAPAROSCOPIC DIVERTED COLOSTOMY;  Surgeon: Axel Filleramirez, Armando, MD;  [facility];  Service: General;  Laterality: N/A;  . SHOULDER ARTHROSCOPY  2009   right  . TOOTH EXTRACTION    . TOTAL HIP ARTHROPLASTY  2002    left    FAMILY HISTORY: Family History  Problem Relation Age of Onset  . Diabetes Mother   . Stroke Mother   . Cancer Brother   . Kidney disease Sister     SOCIAL HISTORY: Social History   Socioeconomic History  . Marital status: Widowed    Spouse name: Not on file  . Number of children: 4  . Years of education: Not on file  . Highest  education level: 8th grade  Occupational History  . Occupation: Retired  Engineer, productionocial Needs  . Financial resource strain: Not on file  . Food insecurity    Worry: Not on file    Inability: Not on file  . Transportation needs    Medical: Not on file    Non-medical: Not on file  Tobacco Use  . Smoking status: Former Smoker    Types: Cigarettes  . Smokeless tobacco: Never Used  Substance and Sexual Activity  . Alcohol use: No    Alcohol/week: 0.0 standard drinks  . Drug use: No  . Sexual activity: Not on file  Lifestyle  .  Physical activity    Days per week: Not on file    Minutes per session: Not on file  . Stress: Not on file  Relationships  . Social Herbalist on phone: Not on file    Gets together: Not on file    Attends religious service: Not on file    Active member of club or organization: Not on file    Attends meetings of clubs or organizations: Not on file    Relationship status: Not on file  . Intimate partner violence    Fear of current or ex partner: Not on file    Emotionally abused: Not on file    Physically abused: Not on file    Forced sexual activity: Not on file  Other Topics Concern  . Not on file  Social History Narrative   Lives w/daughter, Asencion Partridge   Caffeine- 2 cups     PHYSICAL EXAM  GENERAL EXAM/CONSTITUTIONAL: Vitals:  Vitals:   05/28/19 1531  BP: (!) 160/75  Pulse: 80  Temp: 98.6 F (37 C)     There is no height or weight on file to calculate BMI. Wt Readings from Last 3 Encounters:  02/06/19 216 lb (98 kg)  12/02/18 215 lb (97.5 kg)  07/19/18 215 lb 9.8 oz (97.8 kg)     PATIENT IS UNCOMFORTABLE APPEARING; HOLDING HER RIGHT JAW  EDENTULOUS IN LOWER MOUTH  well developed, nourished and groomed; neck is supple  CARDIOVASCULAR:  Examination of carotid arteries is normal; no carotid bruits  Regular rate and rhythm, no murmurs  Examination of peripheral vascular system by observation and palpation is normal  EYES:   Ophthalmoscopic exam of optic discs and posterior segments is normal; no papilledema or hemorrhages  No exam data present  MUSCULOSKELETAL:  Gait, strength, tone, movements noted in Neurologic exam below  NEUROLOGIC: MENTAL STATUS:  No flowsheet data found.  awake, alert, oriented to person, place and time  recent and remote memory intact  normal attention and concentration  language fluent, comprehension intact, naming intact  fund of knowledge appropriate  CRANIAL NERVE:   2nd - no papilledema on fundoscopic exam  2nd, 3rd, 4th, 6th - pupils equal and reactive to light, visual fields full to confrontation, extraocular muscles intact, no nystagmus  5th - facial sensation symmetric  7th - facial strength symmetric  8th - hearing intact  9th - palate elevates symmetrically, uvula midline  11th - shoulder shrug symmetric  12th - tongue protrusion midline  MOTOR:   normal bulk and tone, full strength in the BUE, BLE  SENSORY:   normal and symmetric to light touch  COORDINATION:   finger-nose-finger, fine finger movements normal  REFLEXES:   deep tendon reflexes TRACE and symmetric  GAIT/STATION:   IN WHEELCHAIR     DIAGNOSTIC DATA (LABS, IMAGING, TESTING) - I reviewed patient records, labs, notes, testing and imaging myself where available.  Lab Results  Component Value Date   WBC 8.5 07/19/2018   HGB 9.1 (L) 07/19/2018   HCT 30.3 (L) 07/19/2018   MCV 96.8 07/19/2018   PLT 204 07/19/2018      Component Value Date/Time   NA 138 02/06/2019 1247   K 3.8 02/06/2019 1247   CL 105 02/06/2019 1247   CO2 26 02/06/2019 1247   GLUCOSE 99 02/06/2019 1247   BUN 36 (H) 02/06/2019 1247   CREATININE 1.57 (H) 02/06/2019 1247   CALCIUM 8.9 02/06/2019 1247   PROT 4.9 (L) 07/12/2018 7124  ALBUMIN 1.6 (L) 07/12/2018 0447   AST 49 (H) 07/12/2018 0447   ALT 57 (H) 07/12/2018 0447   ALKPHOS 140 (H) 07/12/2018 0447   BILITOT 0.6 07/12/2018 0447    GFRNONAA 29 (L) 02/06/2019 1247   GFRAA 34 (L) 02/06/2019 1247   No results found for: CHOL, HDL, LDLCALC, LDLDIRECT, TRIG, CHOLHDL No results found for: ZOXW9UHGBA1C No results found for: VITAMINB12 Lab Results  Component Value Date   TSH 0.372 03/06/2016       ASSESSMENT AND PLAN  85 y.o. year old female here with new onset right lower jaw pain since summer 2019, status post #29 extraction, with increasing pain.  No specific dental pathology found.  Could represent idiopathic trigeminal neuralgia.  Will check MRI studies to rule out other secondary causes.  We will try empiric trial of low-dose carbamazepine for symptom control.  Dx:  1. Right trigeminal neuralgia     PLAN:  Orders Placed This Encounter  Procedures  . MR BRAIN W WO CONTRAST  . MR FACE/TRIGEMINAL WO/W CM    Meds ordered this encounter  Medications  . carbamazepine (TEGRETOL) 100 MG chewable tablet    Sig: Chew 1 tablet (100 mg total) by mouth 2 (two) times daily.    Dispense:  60 tablet    Refill:  12   Return in about 3 months (around 08/28/2019).    Suanne MarkerVIKRAM R. PENUMALLI, MD 05/28/2019, 4:38 PM Certified in Neurology, Neurophysiology and Neuroimaging  [facility] [address], [address] [place], [place] [address] [phone]

## 2019-05-29 ENCOUNTER — Telehealth: Payer: Self-pay | Admitting: Diagnostic Neuroimaging

## 2019-05-29 NOTE — Telephone Encounter (Signed)
[employer] medicare order sent to [facility]. No auth they will reach out to the patient to schedule.  

## 2019-06-25 ENCOUNTER — Ambulatory Visit (INDEPENDENT_AMBULATORY_CARE_PROVIDER_SITE_OTHER): Payer: Medicare Other | Admitting: Urology

## 2019-06-25 DIAGNOSIS — R338 Other retention of urine: Secondary | ICD-10-CM

## 2019-06-27 ENCOUNTER — Other Ambulatory Visit: Payer: Self-pay

## 2019-06-27 ENCOUNTER — Other Ambulatory Visit: Payer: Self-pay | Admitting: Diagnostic Neuroimaging

## 2019-06-27 ENCOUNTER — Ambulatory Visit
Admission: RE | Admit: 2019-06-27 | Discharge: 2019-06-27 | Disposition: A | Discharge status: Home / Self Care | Payer: Medicare Other | Source: Ambulatory Visit | Attending: Diagnostic Neuroimaging | Admitting: Diagnostic Neuroimaging

## 2019-06-27 ENCOUNTER — Telehealth: Payer: Self-pay | Admitting: Diagnostic Neuroimaging

## 2019-06-27 DIAGNOSIS — G500 Trigeminal neuralgia: Secondary | ICD-10-CM

## 2019-06-27 MED ORDER — CARBAMAZEPINE 100 MG PO CHEW: 100.0000 mg | CHEWABLE_TABLET | Freq: Two times a day (BID) | ORAL | 3 refills | Status: DC

## 2019-06-27 NOTE — Telephone Encounter (Signed)
Pt is needing a refill on her carbamazepine (TEGRETOL) 100 MG chewable tablet but they are wanting a 90 day supply instead of a 30 day supply so that the pt gets it for free. Pt is out of the medication. Pt needs it sent to the [facility] in [place]. Please advise.

## 2019-06-27 NOTE — Telephone Encounter (Signed)
Rx for 90 days supply with 3 refills sent to [facility] as requested. Patient did have refills on file.

## 2019-07-09 ENCOUNTER — Telehealth: Payer: Self-pay | Admitting: *Deleted

## 2019-07-09 NOTE — Telephone Encounter (Signed)
Called and spoke with daughter, [name] and informed her mother's MR of face/trigeminal nerve came back unremarkable. She is to continue medication and has FU in Sept. Cindy stated the medication is helping her except for occasional pains. She  verbalized understanding, appreciation.

## 2019-07-23 ENCOUNTER — Ambulatory Visit (INDEPENDENT_AMBULATORY_CARE_PROVIDER_SITE_OTHER): Payer: Medicare Other | Admitting: Urology

## 2019-07-23 DIAGNOSIS — R338 Other retention of urine: Secondary | ICD-10-CM

## 2019-08-20 ENCOUNTER — Ambulatory Visit (INDEPENDENT_AMBULATORY_CARE_PROVIDER_SITE_OTHER): Payer: Medicare Other | Admitting: Urology

## 2019-08-20 DIAGNOSIS — R338 Other retention of urine: Secondary | ICD-10-CM

## 2019-08-28 ENCOUNTER — Ambulatory Visit (INDEPENDENT_AMBULATORY_CARE_PROVIDER_SITE_OTHER): Payer: Medicare Other | Admitting: Diagnostic Neuroimaging

## 2019-08-28 ENCOUNTER — Encounter: Payer: Self-pay | Admitting: Diagnostic Neuroimaging

## 2019-08-28 ENCOUNTER — Other Ambulatory Visit: Payer: Self-pay

## 2019-08-28 VITALS — BP 120/60 | HR 58 | Temp 97.3°F

## 2019-08-28 DIAGNOSIS — G500 Trigeminal neuralgia: Secondary | ICD-10-CM | POA: Diagnosis not present

## 2019-08-28 MED ORDER — CARBAMAZEPINE 200 MG PO TABS: 200.0000 mg | ORAL_TABLET | Freq: Two times a day (BID) | ORAL | 4 refills | Status: DC

## 2019-08-28 NOTE — Patient Instructions (Signed)
-   increase carbamazepine to 200mg  twice a day

## 2019-08-28 NOTE — Progress Notes (Signed)
[facility]  PATIENT: [name] DOB: [birth year]  REFERRING CLINICIAN: S Best HISTORY FROM: patient and daughter  REASON FOR VISIT: new consult    HISTORICAL  CHIEF COMPLAINT:  Chief Complaint  Patient presents with  . Trigeminal neuralgia    rm 7, 3 mon FU, dgtr- Cindy, "the medicine works some but still a lot of mouth pain, esp when drinking cold beverages"    HISTORY OF PRESENT ILLNESS:   UPDATE (08/28/19, VRP): Since last visit, doing slightly better with CBZ, but still daily pain, and intermittent flare ups. Chewing, talking, drinking touch still aggravate. No side effects from CBZ.   PRIOR HPI (05/28/19): 85 year old female here for evaluation of right jaw / mouth pain.  Summer 2019 patient had pain in her right lower alveolar ridge, near her partial denture.  Patient thought she had pain in her tooth.  She went to her dentist who did a #29 extraction in the lower right mandible on 10/17/2018.  Unfortunately pain did not improve and in fact worsened.  Now patient having intermittent lightening shocklike pain like a nail driving through her gum.  This happens randomly and without provocation.  Walking and chewing exacerbates.  Patient also has daily baseline pain.  Patient tried Tylenol with codeine but could not take this because of her side effect profile.  She also reports side effects to tramadol, gabapentin, Lyrica.  She has allergic reaction with hives with morphine.  Tramadol causes vertigo.   REVIEW OF SYSTEMS: Full 14 system review of systems performed and negative with exception of: as per HPI.   ALLERGIES: Allergies  Allergen Reactions  . Morphine And Related Hives and Itching  . Tramadol Other (See Comments)    vertigo  . Codeine Anxiety    Very agitated then unable to arouse  . Gabapentin Other (See Comments)    Alters mood  . Lipitor [Atorvastatin] Itching and Rash  . Lyrica [Pregabalin] Other (See Comments)    Lethargic, mood  changes  . Motrin [Ibuprofen] Anxiety    Agitated   . Sulfa Antibiotics Rash  . Tylenol [Acetaminophen]     HOME MEDICATIONS: Outpatient Medications Prior to Visit  Medication Sig Dispense Refill  . acetaminophen (TYLENOL) 325 MG tablet Take 2 tablets (650 mg total) by mouth every 6 (six) hours as needed for mild pain (or Fever >/= 101).    Marland Kitchen albuterol (PROVENTIL HFA;VENTOLIN HFA) 108 (90 Base) MCG/ACT inhaler Inhale 2 puffs into the lungs every 6 (six) hours as needed for wheezing or shortness of breath. 1 Inhaler 1  . allopurinol (ZYLOPRIM) 100 MG tablet Take 1 tablet (100 mg total) by mouth daily. (Patient taking differently: Take 100 mg by mouth 2 (two) times daily. ) 30 tablet 0  . ALPRAZolam (XANAX) 0.5 MG tablet Take 0.5 mg by mouth at bedtime.    Marland Kitchen aspirin EC 81 MG tablet Take 81 mg by mouth daily.    . calcium citrate-vitamin D (CITRACAL+D) 315-200 MG-UNIT tablet Take 1 tablet by mouth daily. 30 tablet 0  . carbamazepine (TEGRETOL) 100 MG chewable tablet Chew 1 tablet (100 mg total) by mouth 2 (two) times daily. 180 tablet 3  . furosemide (LASIX) 40 MG tablet Take 1 tablet (40 mg total) by mouth daily. (Patient taking differently: Take 40 mg by mouth 2 (two) times daily. ) 30 tablet 0  . guaiFENesin (MUCINEX) 600 MG 12 hr tablet Take 1 tablet (600 mg total) by mouth 2 (two) times daily as needed for  cough or to loosen phlegm. 45 tablet 0  . levothyroxine (SYNTHROID, LEVOTHROID) 137 MCG tablet Take 1 tablet (137 mcg total) by mouth daily before breakfast. 30 tablet 0  . meclizine (ANTIVERT) 12.5 MG tablet Take 12.5 mg by mouth 3 (three) times daily as needed for dizziness.    . Multiple Vitamin (MULTIVITAMIN WITH MINERALS) TABS tablet Take 1 tablet by mouth daily.    . nitroGLYCERIN (NITROSTAT) 0.4 MG SL tablet     . omeprazole (PRILOSEC) 40 MG capsule Take 1 capsule (40 mg total) by mouth daily. (Patient taking differently: Take 40 mg by mouth every other day. ) 30 capsule 0  .  sertraline (ZOLOFT) 50 MG tablet Take 1 tablet (50 mg total) by mouth daily. 30 tablet 0  . traZODone (DESYREL) 50 MG tablet Take 50 mg by mouth at bedtime.     No facility-administered medications prior to visit.     PAST MEDICAL HISTORY: Past Medical History:  Diagnosis Date  . Chest pain   . Depression   . GERD (gastroesophageal reflux disease)   . Hypertension   . Sleep apnea   . Thyroid disease   . Trigeminal neuralgia     PAST SURGICAL HISTORY: Past Surgical History:  Procedure Laterality Date  . APPENDECTOMY  1946  . CATARACT EXTRACTION     bilateral  . CYSTOSCOPY N/A 02/11/2019   Procedure: CYSTOSCOPY;  Surgeon: Malen Gauze, MD;  Location: [facility];  Service: Urology;  Laterality: N/A;  . GALLBLADDER SURGERY    . INCISION AND DRAINAGE ABSCESS N/A 06/29/2018   Procedure: REPEAT INCISION AND DRAINAGE  BUTTOCK ABSCESS;  Surgeon: Harriette Bouillon, MD;  Location: [facility];  Service: General;  Laterality: N/A;  . INCISION AND DRAINAGE PERIRECTAL ABSCESS Left 06/27/2018   Procedure: IRRIGATION AND DEBRIDEMENT BUTTOCK ABSCESS;  Surgeon: Andria Meuse, MD;  Location: [facility];  Service: General;  Laterality: Left;  . INSERTION OF SUPRAPUBIC CATHETER N/A 07/03/2018   Procedure: INSERTION OF SUPRAPUBIC CATHETER;  Surgeon: Crista Elliot, MD;  Location: [facility] OR;  Service: Urology;  Laterality: N/A;  . INSERTION OF SUPRAPUBIC CATHETER N/A 02/11/2019   Procedure: INSERTION OF SUPRAPUBIC CATHETER;  Surgeon: Malen Gauze, MD;  Location: [facility];  Service: Urology;  Laterality: N/A;  30 MINS  . KNEE ARTHROPLASTY  2006   left   . KNEE ARTHROPLASTY  2006   right   . LAPAROSCOPIC DIVERTED COLOSTOMY N/A 07/03/2018   Procedure: LAPAROSCOPIC DIVERTED COLOSTOMY;  Surgeon: Axel Filler, MD;  Location: [facility] OR;  Service: General;  Laterality: N/A;  . SHOULDER ARTHROSCOPY  2009   right  . TOOTH EXTRACTION    . TOTAL HIP ARTHROPLASTY  2002    left    FAMILY HISTORY: Family History   Problem Relation Age of Onset  . Diabetes Mother   . Stroke Mother   . Cancer Brother   . Kidney disease Sister     SOCIAL HISTORY: Social History   Socioeconomic History  . Marital status: Widowed    Spouse name: Not on file  . Number of children: 4  . Years of education: Not on file  . Highest education level: 8th grade  Occupational History  . Occupation: Retired  Engineer, production  . Financial resource strain: Not on file  . Food insecurity    Worry: Not on file    Inability: Not on file  . Transportation needs    Medical: Not on file    Non-medical: Not on  file  Tobacco Use  . Smoking status: Former Smoker    Types: Cigarettes  . Smokeless tobacco: Never Used  Substance and Sexual Activity  . Alcohol use: No    Alcohol/week: 0.0 standard drinks  . Drug use: No  . Sexual activity: Not on file  Lifestyle  . Physical activity    Days per week: Not on file    Minutes per session: Not on file  . Stress: Not on file  Relationships  . Social Musicianconnections    Talks on phone: Not on file    Gets together: Not on file    Attends religious service: Not on file    Active member of club or organization: Not on file    Attends meetings of clubs or organizations: Not on file    Relationship status: Not on file  . Intimate partner violence    Fear of current or ex partner: Not on file    Emotionally abused: Not on file    Physically abused: Not on file    Forced sexual activity: Not on file  Other Topics Concern  . Not on file  Social History Narrative   Lives w/daughter, Porfirio Mylararmen   Caffeine- 2 cups     PHYSICAL EXAM  GENERAL EXAM/CONSTITUTIONAL: Vitals:  Vitals:   08/28/19 1343  BP: 120/60  Pulse: (!) 58  Temp: (!) 97.3 F (36.3 C)   There is no height or weight on file to calculate BMI. Wt Readings from Last 3 Encounters:  02/06/19 216 lb (98 kg)  12/02/18 215 lb (97.5 kg)  07/19/18 215 lb 9.8 oz (97.8 kg)    PATIENT IS SLIGHTLY UNCOMFORTABLE APPEARING;  HOLDING HER RIGHT JAW  EDENTULOUS IN LOWER MOUTH  well developed, nourished and groomed; neck is supple  CARDIOVASCULAR:  Examination of carotid arteries is normal; no carotid bruits  Regular rate and rhythm, no murmurs  Examination of peripheral vascular system by observation and palpation is normal  EYES:  Ophthalmoscopic exam of optic discs and posterior segments is normal; no papilledema or hemorrhages No exam data present  MUSCULOSKELETAL:  Gait, strength, tone, movements noted in Neurologic exam below  NEUROLOGIC: MENTAL STATUS:  No flowsheet data found.  awake, alert, oriented to person, place and time  recent and remote memory intact  normal attention and concentration  language fluent, comprehension intact, naming intact  fund of knowledge appropriate  CRANIAL NERVE:   2nd - no papilledema on fundoscopic exam  2nd, 3rd, 4th, 6th - pupils equal and reactive to light, visual fields full to confrontation, extraocular muscles intact, no nystagmus  5th - facial sensation symmetric  7th - facial strength symmetric  8th - hearing intact  9th - palate elevates symmetrically, uvula midline  11th - shoulder shrug symmetric  12th - tongue protrusion midline  MOTOR:   normal bulk and tone, full strength in the BUE, BLE  SENSORY:   normal and symmetric to light touch  COORDINATION:   finger-nose-finger, fine finger movements normal  REFLEXES:   deep tendon reflexes TRACE and symmetric  GAIT/STATION:   IN WHEELCHAIR     DIAGNOSTIC DATA (LABS, IMAGING, TESTING) - I reviewed patient records, labs, notes, testing and imaging myself where available.  Lab Results  Component Value Date   WBC 8.5 07/19/2018   HGB 9.1 (L) 07/19/2018   HCT 30.3 (L) 07/19/2018   MCV 96.8 07/19/2018   PLT 204 07/19/2018      Component Value Date/Time   NA 138 02/06/2019 1247  K 3.8 02/06/2019 1247   CL 105 02/06/2019 1247   CO2 26 02/06/2019 1247    GLUCOSE 99 02/06/2019 1247   BUN 36 (H) 02/06/2019 1247   CREATININE 1.57 (H) 02/06/2019 1247   CALCIUM 8.9 02/06/2019 1247   PROT 4.9 (L) 07/12/2018 0447   ALBUMIN 1.6 (L) 07/12/2018 0447   AST 49 (H) 07/12/2018 0447   ALT 57 (H) 07/12/2018 0447   ALKPHOS 140 (H) 07/12/2018 0447   BILITOT 0.6 07/12/2018 0447   GFRNONAA 29 (L) 02/06/2019 1247   GFRAA 34 (L) 02/06/2019 1247   No results found for: CHOL, HDL, LDLCALC, LDLDIRECT, TRIG, CHOLHDL No results found for: HGBA1C No results found for: VITAMINB12 Lab Results  Component Value Date   TSH 0.372 03/06/2016    06/27/19 MR FACE / TRIGEMINAL  - unremarkable    ASSESSMENT AND PLAN  85 y.o. year old female here with new onset right lower jaw pain since summer 2019, status post #29 extraction, with increasing pain.  No specific dental pathology found.  Could represent idiopathic trigeminal neuralgia.    Dx:  1. Right trigeminal neuralgia     PLAN:  TRIGEMINAL NEURALGIA  - increase CBZ to 200mg  twice a day; increase over time as tolerated; check CBC, CMP every 6-12 months - may consider gamma knife treatment in future  Meds ordered this encounter  Medications  . carbamazepine (TEGRETOL) 200 MG tablet    Sig: Take 1 tablet (200 mg total) by mouth 2 (two) times daily.    Dispense:  180 tablet    Refill:  4   Return in about 6 months (around 02/25/2020).    Penni Bombard, MD 03/29/7740, 2:87 PM Certified in Neurology, Neurophysiology and Neuroimaging  [facility] [address] [place], [place] [address] [phone]

## 2019-09-05 ENCOUNTER — Telehealth: Payer: Self-pay | Admitting: *Deleted

## 2019-09-05 NOTE — Telephone Encounter (Signed)
Agree with plan. May need urgent care follow up for eval. -VRP

## 2019-09-05 NOTE — Telephone Encounter (Signed)
Received call from daughter, [name] stating her mother had UTI on Sat. She's been nauseated, has watery diarrhea (has colostomy), is tired, washed out. She wanted to know if these symptoms are caused by increasing carbamazepine dose from 100 mg two x day to 200 mg two x day. She stated that when patient c/o UTI symptoms she gave her AZO and "it went away".  She stated the increased dose is working well for her pain, but [name] is unsure if she's having side effects of increased carbamazepine. Patient has appt with urologist next Wed. I advised Condy to call urologist as mother may still have UTI, may need to have UA, antibiotics. I advised her the nausea may be from carbamazepine, but other symptoms probably not. I advised Cindy to reduce carbamazepine to 100 mg in am, 200 mg at night to see if symptoms resolve. [name] will call urologist and agreed to decrease carbamazepine dose to see if nausea subsides. [name] will let us know how mother is doing. I advised will let Dr Roslyn Smiling know of phone call and call her back if he has further recommendations, instructions. Cindy  verbalized understanding, appreciation.

## 2019-09-05 NOTE — Telephone Encounter (Signed)
Called [facility] and advised Dr Leta Baptist agreed with plan and stated she may need urgent care eval if she gets worse. Cindy stated she'll see urologist tomorrow 9 am.  [name] will cal next week with update,  verbalized understanding, appreciation.

## 2019-09-06 ENCOUNTER — Other Ambulatory Visit: Payer: Self-pay

## 2019-09-06 ENCOUNTER — Ambulatory Visit (INDEPENDENT_AMBULATORY_CARE_PROVIDER_SITE_OTHER): Payer: Medicare Other | Admitting: Urology

## 2019-09-06 ENCOUNTER — Other Ambulatory Visit (HOSPITAL_COMMUNITY)
Admission: RE | Admit: 2019-09-06 | Discharge: 2019-09-06 | Disposition: A | Discharge status: Home / Self Care | Payer: Medicare Other | Source: Other Acute Inpatient Hospital | Attending: Urology | Admitting: Urology

## 2019-09-06 DIAGNOSIS — R338 Other retention of urine: Secondary | ICD-10-CM

## 2019-09-08 LAB — URINE CULTURE: Culture: >=100000 — AB

## 2019-09-11 NOTE — Telephone Encounter (Signed)
LVM for [name] advising that if her mother's nausea has subsided she may try going back to previous dose of carbamazepine. If not, she can stay at current dose. Left number for questions.

## 2019-09-11 NOTE — Telephone Encounter (Signed)
[name] called in and stated pt did have an infection , she wants to know if she needs to keeps meds as is or go back to the [employer]

## 2019-09-18 ENCOUNTER — Ambulatory Visit (INDEPENDENT_AMBULATORY_CARE_PROVIDER_SITE_OTHER): Payer: Medicare Other | Admitting: Urology

## 2019-09-18 DIAGNOSIS — R338 Other retention of urine: Secondary | ICD-10-CM

## 2019-10-23 ENCOUNTER — Ambulatory Visit (INDEPENDENT_AMBULATORY_CARE_PROVIDER_SITE_OTHER): Payer: Medicare Other | Admitting: Urology

## 2019-10-23 DIAGNOSIS — R338 Other retention of urine: Secondary | ICD-10-CM

## 2019-10-25 ENCOUNTER — Other Ambulatory Visit: Payer: Self-pay

## 2019-10-25 ENCOUNTER — Emergency Department (HOSPITAL_COMMUNITY)
Admission: EM | Admit: 2019-10-25 | Discharge: 2019-10-25 | Disposition: A | Discharge status: Home / Self Care | Payer: Medicare Other | Attending: Emergency Medicine | Admitting: Emergency Medicine

## 2019-10-25 ENCOUNTER — Encounter (HOSPITAL_COMMUNITY): Payer: Self-pay

## 2019-10-25 DIAGNOSIS — N3289 Other specified disorders of bladder: Secondary | ICD-10-CM | POA: Diagnosis not present

## 2019-10-25 DIAGNOSIS — I5032 Chronic diastolic (congestive) heart failure: Secondary | ICD-10-CM | POA: Diagnosis not present

## 2019-10-25 DIAGNOSIS — Z79899 Other long term (current) drug therapy: Secondary | ICD-10-CM | POA: Diagnosis not present

## 2019-10-25 DIAGNOSIS — I110 Hypertensive heart disease with heart failure: Secondary | ICD-10-CM | POA: Insufficient documentation

## 2019-10-25 DIAGNOSIS — Y732 Prosthetic and other implants, materials and accessory gastroenterology and urology devices associated with adverse incidents: Secondary | ICD-10-CM | POA: Diagnosis not present

## 2019-10-25 DIAGNOSIS — Z8673 Personal history of transient ischemic attack (TIA), and cerebral infarction without residual deficits: Secondary | ICD-10-CM | POA: Insufficient documentation

## 2019-10-25 DIAGNOSIS — N390 Urinary tract infection, site not specified: Secondary | ICD-10-CM | POA: Insufficient documentation

## 2019-10-25 DIAGNOSIS — Z7982 Long term (current) use of aspirin: Secondary | ICD-10-CM | POA: Insufficient documentation

## 2019-10-25 DIAGNOSIS — T83010A Breakdown (mechanical) of cystostomy catheter, initial encounter: Secondary | ICD-10-CM | POA: Insufficient documentation

## 2019-10-25 DIAGNOSIS — Z87891 Personal history of nicotine dependence: Secondary | ICD-10-CM | POA: Insufficient documentation

## 2019-10-25 LAB — URINALYSIS, ROUTINE W REFLEX MICROSCOPIC
Bilirubin Urine: NEGATIVE
Glucose, UA: NEGATIVE mg/dL
Ketones, ur: NEGATIVE mg/dL
Nitrite: NEGATIVE
Protein, ur: 30 mg/dL — AB
RBC / HPF: >50 RBC/hpf — ABNORMAL HIGH (ref 0–5)
Specific Gravity, Urine: 1.01 (ref 1.005–1.030)
pH: 6 (ref 5.0–8.0)

## 2019-10-25 MED ORDER — CEPHALEXIN 250 MG PO CAPS
250.0000 mg | ORAL_CAPSULE | Freq: Once | ORAL | Status: AC
  Administered 2019-10-25: 16:00:00 250 mg via ORAL
  Filled 2019-10-25: qty 1

## 2019-10-25 MED ORDER — ONDANSETRON 8 MG PO TBDP
8.0000 mg | ORAL_TABLET | Freq: Once | ORAL | Status: AC
  Administered 2019-10-25: 8 mg via ORAL
  Filled 2019-10-25: qty 1

## 2019-10-25 MED ORDER — OXYBUTYNIN CHLORIDE 5 MG PO TABS: 2.5000 mg | ORAL_TABLET | Freq: Three times a day (TID) | ORAL | 0 refills | Status: DC | PRN

## 2019-10-25 MED ORDER — CEPHALEXIN 250 MG PO CAPS: 250.0000 mg | ORAL_CAPSULE | Freq: Four times a day (QID) | ORAL | 0 refills | Status: DC

## 2019-10-25 MED ORDER — OXYBUTYNIN CHLORIDE 5 MG PO TABS
5.0000 mg | ORAL_TABLET | Freq: Three times a day (TID) | ORAL | Status: DC
  Administered 2019-10-25: 5 mg via ORAL
  Filled 2019-10-25 (×9): qty 1

## 2019-10-25 NOTE — ED Notes (Addendum)
Attempted to irrigate suprapubic catheter with 122ml of sterile water per Dr. Beatriz Chancellor order. Approximately 20-88ml was injected and RN stopped due to c/o pain and resistance. Slight resistance was felt during injection and pt immediately started c/o suprapubic pain. Attempted to draw back on the syringe with no return of fluid x multiple attempts. Clear drainage is now draining around catheter exit site that wasn't earlier.

## 2019-10-25 NOTE — Discharge Instructions (Addendum)
The testing today indicates that she may have a urinary tract infection, and bladder spasm, causing the leakage of urine from the urethra.  Start the prescription sent to her pharmacy, tomorrow.  Continue usual catheter care at home.  Follow-up with the urologist in a week or 2 for a checkup.

## 2019-10-25 NOTE — ED Notes (Signed)
Patient has 75 ml of urine per bladder scanner.  Patient's daughter stated that patient's bedding and pads were wet this morning as if the urine was "leaking around catheter".

## 2019-10-25 NOTE — ED Provider Notes (Signed)
[name] Provider Note   CSN: [id number] Arrival date & time: 10/25/19  0935     History   Chief Complaint Chief Complaint  Patient presents with  . suprapubic catheter issues  . Dysuria    HPI [name] is a 85 y.o. female.     HPI  Past Medical History:  Diagnosis Date  . Chest pain   . Depression   . GERD (gastroesophageal reflux disease)   . Hypertension   . Sleep apnea   . Thyroid disease   . Trigeminal neuralgia     Patient Active Problem List   Diagnosis Date Noted  . Skin ulcer of sacrum (HCC)   . Acute gout of left hand   . Acute blood loss anemia   . Acute on chronic diastolic (congestive) heart failure (HCC)   . Sepsis (HCC) 07/11/2018  . Debility   . OSA (obstructive sleep apnea)   . Diastolic dysfunction   . AKI (acute kidney injury) (HCC)   . Post-operative pain   . Tachypnea   . Steroid-induced hyperglycemia   . Leukocytosis   . Pressure injury of skin 06/27/2018  . Cellulitis and abscess of buttock   . Gastrointestinal hemorrhage   . Hypotension 06/26/2018  . ARF (acute renal failure) (HCC) 06/26/2018  . Black stool 06/26/2018  . Anemia 06/26/2018  . Bacteremia 03/07/2016  . Atypical chest pain 03/07/2016  . Acute-on-chronic kidney injury (HCC) 03/07/2016  . Essential hypertension 03/07/2016  . Chronic diastolic congestive heart failure (HCC) 03/07/2016  . Hypothyroidism 03/07/2016  . Depression with anxiety 03/07/2016  . UTI (lower urinary tract infection)   . Near syncope 08/20/2015  . Morbid obesity (HCC) 08/20/2015  . Pain in the chest 07/06/2015  . Awareness alteration, transient 05/07/2015  . History of stroke 05/07/2015    Past Surgical History:  Procedure Laterality Date  . APPENDECTOMY  1946  . CATARACT EXTRACTION     bilateral  . CYSTOSCOPY N/A 02/11/2019   Procedure: CYSTOSCOPY;  Surgeon: Malen GauzeMcKenzie, Patrick L, MD;  Location: [facility];  Service: Urology;  Laterality: N/A;  . GALLBLADDER  SURGERY    . INCISION AND DRAINAGE ABSCESS N/A 06/29/2018   Procedure: REPEAT INCISION AND DRAINAGE  BUTTOCK ABSCESS;  Surgeon: Harriette Bouillonornett, Thomas, MD;  Location: [facility] OR;  Service: General;  Laterality: N/A;  . INCISION AND DRAINAGE PERIRECTAL ABSCESS Left 06/27/2018   Procedure: IRRIGATION AND DEBRIDEMENT BUTTOCK ABSCESS;  Surgeon: Andria MeuseWhite, Christopher M, MD;  Location: [facility] OR;  Service: General;  Laterality: Left;  . INSERTION OF SUPRAPUBIC CATHETER N/A 07/03/2018   Procedure: INSERTION OF SUPRAPUBIC CATHETER;  Surgeon: Crista ElliotBell, Eugene D III, MD;  [facility];  Service: Urology;  Laterality: N/A;  . INSERTION OF SUPRAPUBIC CATHETER N/A 02/11/2019   Procedure: INSERTION OF SUPRAPUBIC CATHETER;  Surgeon: Malen GauzeMcKenzie, Patrick L, MD;  Location: [facility];  Service: Urology;  Laterality: N/A;  30 MINS  . KNEE ARTHROPLASTY  2006   left   . KNEE ARTHROPLASTY  2006   right   . LAPAROSCOPIC DIVERTED COLOSTOMY N/A 07/03/2018   Procedure: LAPAROSCOPIC DIVERTED COLOSTOMY;  Surgeon: Axel Filleramirez, Armando, MD;  [facility];  Service: General;  Laterality: N/A;  . SHOULDER ARTHROSCOPY  2009   right  . TOOTH EXTRACTION    . TOTAL HIP ARTHROPLASTY  2002    left     OB History   No obstetric history on file.      Home Medications    Prior to Admission  medications   Medication Sig Start Date End Date Taking? Authorizing Provider  acetaminophen (TYLENOL) 325 MG tablet Take 2 tablets (650 mg total) by mouth every 6 (six) hours as needed for mild pain (or Fever >/= 101). 07/19/18   Angiulli, Mcarthur Rossetti, PA-C  albuterol (PROVENTIL HFA;VENTOLIN HFA) 108 (90 Base) MCG/ACT inhaler Inhale 2 puffs into the lungs every 6 (six) hours as needed for wheezing or shortness of breath. 07/19/18   Angiulli, Mcarthur Rossetti, PA-C  allopurinol (ZYLOPRIM) 100 MG tablet Take 1 tablet (100 mg total) by mouth daily. Patient taking differently: Take 100 mg by mouth 2 (two) times daily.  07/19/18   Angiulli, Mcarthur Rossetti, PA-C  ALPRAZolam Prudy Feeler) 0.5 MG  tablet Take 0.5 mg by mouth at bedtime. 01/21/19   [provider]  aspirin EC 81 MG tablet Take 81 mg by mouth daily.    [provider]  calcium citrate-vitamin D (CITRACAL+D) 315-200 MG-UNIT tablet Take 1 tablet by mouth daily. 07/19/18   Angiulli, Mcarthur Rossetti, PA-C  carbamazepine (TEGRETOL) 100 MG chewable tablet Chew 1 tablet (100 mg total) by mouth 2 (two) times daily. 06/27/19   Penumalli, Glenford Bayley, MD  carbamazepine (TEGRETOL) 200 MG tablet Take 1 tablet (200 mg total) by mouth 2 (two) times daily. 08/28/19   Penumalli, Glenford Bayley, MD  cephALEXin (KEFLEX) 250 MG capsule Take 1 capsule (250 mg total) by mouth 4 (four) times daily. 10/25/19   Mancel Bale, MD  furosemide (LASIX) 40 MG tablet Take 1 tablet (40 mg total) by mouth daily. Patient taking differently: Take 40 mg by mouth 2 (two) times daily.  07/19/18   Angiulli, Mcarthur Rossetti, PA-C  guaiFENesin (MUCINEX) 600 MG 12 hr tablet Take 1 tablet (600 mg total) by mouth 2 (two) times daily as needed for cough or to loosen phlegm. 03/09/16   Alison Murray, MD  levothyroxine (SYNTHROID, LEVOTHROID) 137 MCG tablet Take 1 tablet (137 mcg total) by mouth daily before breakfast. 07/19/18   Angiulli, Mcarthur Rossetti, PA-C  meclizine (ANTIVERT) 12.5 MG tablet Take 12.5 mg by mouth 3 (three) times daily as needed for dizziness.    [provider]  Multiple Vitamin (MULTIVITAMIN WITH MINERALS) TABS tablet Take 1 tablet by mouth daily.    [provider]  nitroGLYCERIN (NITROSTAT) 0.4 MG SL tablet  03/07/19   [provider]  omeprazole (PRILOSEC) 40 MG capsule Take 1 capsule (40 mg total) by mouth daily. Patient taking differently: Take 40 mg by mouth every other day.  07/19/18   Angiulli, Mcarthur Rossetti, PA-C  oxybutynin (DITROPAN) 5 MG tablet Take 0.5 tablets (2.5 mg total) by mouth every 8 (eight) hours as needed for bladder spasms. 10/25/19   Mancel Bale, MD  sertraline (ZOLOFT) 50 MG tablet Take 1 tablet (50 mg total) by mouth  daily. 07/19/18   Angiulli, Mcarthur Rossetti, PA-C  traZODone (DESYREL) 50 MG tablet Take 50 mg by mouth at bedtime. 01/21/19   [provider]    Family History Family History  Problem Relation Age of Onset  . Diabetes Mother   . Stroke Mother   . Cancer Brother   . Kidney disease Sister     Social History Social History   Tobacco Use  . Smoking status: Former Smoker    Types: Cigarettes  . Smokeless tobacco: Never Used  Substance Use Topics  . Alcohol use: No    Alcohol/week: 0.0 standard drinks  . Drug use: No     Allergies   Morphine  and related, Tramadol, Codeine, Gabapentin, Lipitor [atorvastatin], Lyrica [pregabalin], Motrin [ibuprofen], Sulfa antibiotics, and Tylenol [acetaminophen]   Review of Systems Review of Systems   Physical Exam Updated Vital Signs BP (!) 125/51   Pulse 81   Temp 97.8 F (36.6 C) (Oral)   Resp 18   Ht  (1.626 m)   Wt 95.3 kg   SpO2 96%   BMI 36.05 kg/m   Physical Exam   ED Treatments / Results  Labs (all labs ordered are listed, but only abnormal results are displayed) Labs Reviewed  URINALYSIS, ROUTINE W REFLEX MICROSCOPIC - Abnormal; Notable for the following components:      Result Value   APPearance HAZY (*)    Hgb urine dipstick LARGE (*)    Protein, ur 30 (*)    Leukocytes,Ua LARGE (*)    RBC / HPF >50 (*)    Bacteria, UA RARE (*)    All other components within normal limits  URINE CULTURE    EKG None  Radiology No results found.  Procedures SUPRAPUBIC TUBE PLACEMENT  Date/Time: 10/25/2019 2:03 PM Performed by: Mancel Bale, MD Authorized by: Mancel Bale, MD   Consent:    Consent obtained:  Verbal   Consent given by:  Patient   Risks discussed:  Bleeding, infection and pain   Alternatives discussed:  No treatment Anesthesia (see MAR for exact dosages):    Anesthesia method:  None Procedure details:    Complexity:  Simple   Catheter type:  Foley   Catheter size:  22 Fr   Ultrasound  guidance: no     Number of attempts:  1   Aspirate characteristics: No urine initially. Post-procedure details:    Patient tolerance of procedure:  Tolerated well, no immediate complications Comments:     Catheter balloon, instilled with 5 cc of saline.  Catheter mobile after placement, but did not pull out of the stoma indicating functional balloon.  .Critical Care Performed by: Mancel Bale, MD Authorized by: Mancel Bale, MD   Critical care provider statement:    Critical care time (minutes):  35   Critical care start time:  10/25/2019 2:00 PM   Critical care end time:  10/25/2019 4:01 PM   Critical care time was exclusive of:  Separately billable procedures and treating other patients   Critical care was necessary to treat or prevent imminent or life-threatening deterioration of the following conditions: Suprapubic catheter malfunction.   Critical care was time spent personally by me on the following activities:  Blood draw for specimens, development of treatment plan with patient or surrogate, discussions with consultants, evaluation of patient's response to treatment, examination of patient, obtaining history from patient or surrogate, ordering and performing treatments and interventions, ordering and review of laboratory studies, pulse oximetry, re-evaluation of patient's condition, review of old charts and ordering and review of radiographic studies   (including critical care time)  Medications Ordered in ED Medications  oxybutynin (DITROPAN) tablet 5 mg (has no administration in time range)  cephALEXin (KEFLEX) capsule 250 mg (has no administration in time range)  ondansetron (ZOFRAN-ODT) disintegrating tablet 8 mg (8 mg Oral Given 10/25/19 1415)     Initial Impression / Assessment and Plan / ED Course  I have reviewed the triage vital signs and the nursing notes.  Pertinent labs & imaging results that were available during my care of the patient were reviewed by me and  considered in my medical decision making (see chart for details).  Patient Vitals for the past 24 hrs:  BP Temp Temp src Pulse Resp SpO2 Height Weight  10/25/19 1601 - 97.8 F (36.6 C) Oral - 18 - - -  10/25/19 1600 (!) 125/51 - - 81 - 96 % - -  10/25/19 1530 127/63 - - 79 - 96 % - -  10/25/19 1515 132/78 - - 70 - 90 % - -  10/25/19 1130 (!) 135/99 - - (!) 51 - 96 % - -  10/25/19 1100 129/85 - - - - 95 % - -  10/25/19 1015 - - - - - - 5\' 4"  (1.626 m) 95.3 kg  10/25/19 1011 131/72 99.3 F (37.4 C) Oral 65 - 95 % - -    3:59 PM Reevaluation with update and discussion. After initial assessment and treatment, an updated evaluation reveals she is comfortable at this time and he is draining yellow urine into her catheter bag.  Findings discussed with patient and daughter, all questions were answered. Daleen Bo   Medical Decision Making: Suprapubic catheter dysfunction, balloon likely inflated into stomal tract.  Catheter was replaced with good function afterwards.  She continued to leak some urine from her urethra, therefore likely has bladder spasm and possible UTI causing this.  Patient is hemodynamically stable without evidence for metabolic instability.  She is stable for discharge with outpatient management.    CRITICAL CARE-yes Performed by: Daleen Bo   Nursing Notes Reviewed/ Care Coordinated Applicable Imaging Reviewed Interpretation of Laboratory Data incorporated into ED treatment  The patient appears reasonably screened and/or stabilized for discharge and I doubt any other medical condition or other [facility] requiring further screening, evaluation, or treatment in the ED at this time prior to discharge.  Plan: Home Medications-continue current medication; Home Treatments-routine catheter care; return here if the recommended treatment, does not improve the symptoms; Recommended follow up-urology follow-up 1 to 2 weeks and as needed.     Final Clinical  Impressions(s) / ED Diagnoses   Final diagnoses:  Lower urinary tract infectious disease  Bladder spasm  Suprapubic catheter dysfunction, initial encounter [facility])    ED Discharge Orders         Ordered    cephALEXin (KEFLEX) 250 MG capsule  4 times daily     10/25/19 1604    oxybutynin (DITROPAN) 5 MG tablet  Every 8 hours PRN     10/25/19 1604           Daleen Bo, MD 10/25/19 1606

## 2019-10-25 NOTE — ED Notes (Signed)
Pt has had 3 incontinent episodes so far with urine coming through the urethra.

## 2019-10-25 NOTE — ED Provider Notes (Signed)
[facility] EMERGENCY DEPARTMENT Provider Note   CSN: [id number] Arrival date & time: 10/25/19  0935     History   Chief Complaint Chief Complaint  Patient presents with   suprapubic catheter issues   Dysuria    HPI [name] is a 85 y.o. female.     HPI   She is here for evaluation of difficulty with her suprapubic catheter.  It was replaced, 2 days ago, at the urology office.  Since that time she has had discomfort at the site of the catheter.  Also, she has had decreased output from the catheter in the last 24 hours, and is now leaking urine from her urethra.  She has a chronic suprapubic catheter following placement when she had a buttocks infection.  At that time she also had a colostomy placed.  Patient not having fever, vomiting or anorexia at this time.  She is not dizzy.  She is here with her family member, daughter.  There are no other known modifying factors.  Past Medical History:  Diagnosis Date   Chest pain    Depression    GERD (gastroesophageal reflux disease)    Hypertension    Sleep apnea    Thyroid disease    Trigeminal neuralgia     Patient Active Problem List   Diagnosis Date Noted   Skin ulcer of sacrum (HCC)    Acute gout of left hand    Acute blood loss anemia    Acute on chronic diastolic (congestive) heart failure (HCC)    Sepsis (HCC) 07/11/2018   Debility    OSA (obstructive sleep apnea)    Diastolic dysfunction    AKI (acute kidney injury) (HCC)    Post-operative pain    Tachypnea    Steroid-induced hyperglycemia    Leukocytosis    Pressure injury of skin 06/27/2018   Cellulitis and abscess of buttock    Gastrointestinal hemorrhage    Hypotension 06/26/2018   ARF (acute renal failure) (HCC) 06/26/2018   Black stool 06/26/2018   Anemia 06/26/2018   Bacteremia 03/07/2016   Atypical chest pain 03/07/2016   Acute-on-chronic kidney injury (HCC) 03/07/2016   Essential hypertension 03/07/2016    Chronic diastolic congestive heart failure (HCC) 03/07/2016   Hypothyroidism 03/07/2016   Depression with anxiety 03/07/2016   UTI (lower urinary tract infection)    Near syncope 08/20/2015   Morbid obesity (HCC) 08/20/2015   Pain in the chest 07/06/2015   Awareness alteration, transient 05/07/2015   History of stroke 05/07/2015    Past Surgical History:  Procedure Laterality Date   APPENDECTOMY  1946   CATARACT EXTRACTION     bilateral   CYSTOSCOPY N/A 02/11/2019   Procedure: CYSTOSCOPY;  Surgeon: Malen Gauze, MD;  Location: [facility];  Service: Urology;  Laterality: N/A;   GALLBLADDER SURGERY     INCISION AND DRAINAGE ABSCESS N/A 06/29/2018   Procedure: REPEAT INCISION AND DRAINAGE  BUTTOCK ABSCESS;  Surgeon: Harriette Bouillon, MD;  Location: [facility] OR;  Service: General;  Laterality: N/A;   INCISION AND DRAINAGE PERIRECTAL ABSCESS Left 06/27/2018   Procedure: IRRIGATION AND DEBRIDEMENT BUTTOCK ABSCESS;  Surgeon: Andria Meuse, MD;  Location: [facility] OR;  Service: General;  Laterality: Left;   INSERTION OF SUPRAPUBIC CATHETER N/A 07/03/2018   Procedure: INSERTION OF SUPRAPUBIC CATHETER;  Surgeon: Crista Elliot, MD;  Location[facility];  Service: Urology;  Laterality: N/A;   INSERTION OF SUPRAPUBIC CATHETER N/A 02/11/2019   Procedure: INSERTION OF SUPRAPUBIC CATHETER;  Surgeon: Malen Gauze, MD;  Location: [facility];  Service: Urology;  Laterality: N/A;  30 MINS   KNEE ARTHROPLASTY  2006   left    KNEE ARTHROPLASTY  2006   right    LAPAROSCOPIC DIVERTED COLOSTOMY N/A 07/03/2018   Procedure: LAPAROSCOPIC DIVERTED COLOSTOMY;  Surgeon: Axel Filler, MD;  Location: [facility] OR;  Service: General;  Laterality: N/A;   SHOULDER ARTHROSCOPY  2009   right   TOOTH EXTRACTION     TOTAL HIP ARTHROPLASTY  2002    left     OB History   No obstetric history on file.      Home Medications    Prior to Admission medications   Medication Sig Start Date End Date  Taking? Authorizing Provider  acetaminophen (TYLENOL) 325 MG tablet Take 2 tablets (650 mg total) by mouth every 6 (six) hours as needed for mild pain (or Fever >/= 101). 07/19/18   Angiulli, Mcarthur Rossetti, PA-C  albuterol (PROVENTIL HFA;VENTOLIN HFA) 108 (90 Base) MCG/ACT inhaler Inhale 2 puffs into the lungs every 6 (six) hours as needed for wheezing or shortness of breath. 07/19/18   Angiulli, Mcarthur Rossetti, PA-C  allopurinol (ZYLOPRIM) 100 MG tablet Take 1 tablet (100 mg total) by mouth daily. Patient taking differently: Take 100 mg by mouth 2 (two) times daily.  07/19/18   Angiulli, Mcarthur Rossetti, PA-C  ALPRAZolam Prudy Feeler) 0.5 MG tablet Take 0.5 mg by mouth at bedtime. 01/21/19   [provider]  aspirin EC 81 MG tablet Take 81 mg by mouth daily.    [provider]  calcium citrate-vitamin D (CITRACAL+D) 315-200 MG-UNIT tablet Take 1 tablet by mouth daily. 07/19/18   Angiulli, Mcarthur Rossetti, PA-C  carbamazepine (TEGRETOL) 100 MG chewable tablet Chew 1 tablet (100 mg total) by mouth 2 (two) times daily. 06/27/19   Penumalli, Glenford Bayley, MD  carbamazepine (TEGRETOL) 200 MG tablet Take 1 tablet (200 mg total) by mouth 2 (two) times daily. 08/28/19   Penumalli, Glenford Bayley, MD  cephALEXin (KEFLEX) 250 MG capsule Take 1 capsule (250 mg total) by mouth 4 (four) times daily. 10/25/19   Mancel Bale, MD  furosemide (LASIX) 40 MG tablet Take 1 tablet (40 mg total) by mouth daily. Patient taking differently: Take 40 mg by mouth 2 (two) times daily.  07/19/18   Angiulli, Mcarthur Rossetti, PA-C  guaiFENesin (MUCINEX) 600 MG 12 hr tablet Take 1 tablet (600 mg total) by mouth 2 (two) times daily as needed for cough or to loosen phlegm. 03/09/16   Alison Murray, MD  levothyroxine (SYNTHROID, LEVOTHROID) 137 MCG tablet Take 1 tablet (137 mcg total) by mouth daily before breakfast. 07/19/18   Angiulli, Mcarthur Rossetti, PA-C  meclizine (ANTIVERT) 12.5 MG tablet Take 12.5 mg by mouth 3 (three) times daily as needed for dizziness.    [provider]  Multiple Vitamin (MULTIVITAMIN WITH MINERALS) TABS tablet Take 1 tablet by mouth daily.    [provider]  nitroGLYCERIN (NITROSTAT) 0.4 MG SL tablet  03/07/19   [provider]  omeprazole (PRILOSEC) 40 MG capsule Take 1 capsule (40 mg total) by mouth daily. Patient taking differently: Take 40 mg by mouth every other day.  07/19/18   Angiulli, Mcarthur Rossetti, PA-C  oxybutynin (DITROPAN) 5 MG tablet Take 0.5 tablets (2.5 mg total) by mouth every 8 (eight) hours as needed for bladder spasms. 10/25/19   Mancel Bale, MD  sertraline (ZOLOFT) 50 MG tablet Take 1 tablet (50 mg total) by mouth  daily. 07/19/18   Angiulli, Mcarthur Rossettianiel J, PA-C  traZODone (DESYREL) 50 MG tablet Take 50 mg by mouth at bedtime. 01/21/19   [provider]    Family History Family History  Problem Relation Age of Onset   Diabetes Mother    Stroke Mother    Cancer Brother    Kidney disease Sister     Social History Social History   Tobacco Use   Smoking status: Former Smoker    Types: Cigarettes   Smokeless tobacco: Never Used  Substance Use Topics   Alcohol use: No    Alcohol/week: 0.0 standard drinks   Drug use: No     Allergies   Morphine and related, Tramadol, Codeine, Gabapentin, Lipitor [atorvastatin], Lyrica [pregabalin], Motrin [ibuprofen], Sulfa antibiotics, and Tylenol [acetaminophen]   Review of Systems Review of Systems  All other systems reviewed and are negative.    Physical Exam Updated Vital Signs BP (!) 125/51    Pulse 81    Temp 97.8 F (36.6 C) (Oral)    Resp 18    Ht 5\' 4"  (1.626 m)    Wt 95.3 kg    SpO2 96%    BMI 36.05 kg/m   Physical Exam Vitals signs and nursing note reviewed.  Constitutional:      General: She is not in acute distress.    Appearance: She is well-developed. She is obese. She is toxic-appearing. She is not ill-appearing or diaphoretic.  HENT:     Head: Normocephalic and atraumatic.     Right Ear: External ear  normal.     Left Ear: External ear normal.  Eyes:     Conjunctiva/sclera: Conjunctivae normal.     Pupils: Pupils are equal, round, and reactive to light.  Neck:     Musculoskeletal: Normal range of motion and neck supple.     Trachea: Phonation normal.  Cardiovascular:     Rate and Rhythm: Normal rate.  Pulmonary:     Effort: Pulmonary effort is normal.  Abdominal:     Palpations: Abdomen is soft.     Tenderness: There is abdominal tenderness (Mild around suprapubic catheter.).     Comments: Midline suprapubic catheter, very minimal surrounding erythema, without drainage from the stoma.  Foley catheter.  The stoma appears to be normal, but with attempts at mobilizing it it does not move.  Musculoskeletal: Normal range of motion.  Skin:    General: Skin is warm and dry.  Neurological:     Mental Status: She is alert and oriented to person, place, and time.     Cranial Nerves: No cranial nerve deficit.     Sensory: No sensory deficit.     Motor: No abnormal muscle tone.     Coordination: Coordination normal.  Psychiatric:        Behavior: Behavior normal.        Thought Content: Thought content normal.        Judgment: Judgment normal.      ED Treatments / Results  Labs (all labs ordered are listed, but only abnormal results are displayed) Labs Reviewed  URINALYSIS, ROUTINE W REFLEX MICROSCOPIC - Abnormal; Notable for the following components:      Result Value   APPearance HAZY (*)    Hgb urine dipstick LARGE (*)    Protein, ur 30 (*)    Leukocytes,Ua LARGE (*)    RBC / HPF >50 (*)    Bacteria, UA RARE (*)    All other components within normal limits  URINE CULTURE    EKG None  Radiology No results found.  Procedures SUPRAPUBIC TUBE PLACEMENT  Date/Time: 10/26/2019 10:11 AM Performed by: Daleen Bo, MD Authorized by: Daleen Bo, MD   Consent:    Consent obtained:  Verbal   Consent given by:  Patient   Risks discussed:  Bleeding, infection and  pain   Alternatives discussed:  No treatment Anesthesia (see MAR for exact dosages):    Anesthesia method:  None Procedure details:    Catheter type:  Foley   Catheter size:  22 Fr   Number of attempts:  1   Urine characteristics:  Clear Post-procedure details:    Patient tolerance of procedure:  Tolerated well, no immediate complications   (including critical care time)  Medications Ordered in ED Medications  ondansetron (ZOFRAN-ODT) disintegrating tablet 8 mg (8 mg Oral Given 10/25/19 1415)  cephALEXin (KEFLEX) capsule 250 mg (250 mg Oral Given 10/25/19 1607)     Initial Impression / Assessment and Plan / ED Course  I have reviewed the triage vital signs and the nursing notes.  Pertinent labs & imaging results that were available during my care of the patient were reviewed by me and considered in my medical decision making (see chart for details).        Patient Vitals for the past 24 hrs:  BP Temp Temp src Pulse Resp SpO2 Height Weight  10/25/19 1601 -- 97.8 F (36.6 C) Oral -- 18 -- -- --  10/25/19 1600 (!) 125/51 -- -- 81 -- 96 % -- --  10/25/19 1530 127/63 -- -- 79 -- 96 % -- --  10/25/19 1515 132/78 -- -- 70 -- 90 % -- --  10/25/19 1130 (!) 135/99 -- -- (!) 51 -- 96 % -- --  10/25/19 1100 129/85 -- -- -- -- 95 % -- --  10/25/19 1015 -- -- -- -- -- -- 5\' 4"  (1.626 m) 95.3 kg    At D/C- Reevaluation with update and discussion. After initial assessment and treatment, an updated evaluation reveals she is comfortable, and currently draining yellow urine in the Foley drainage bag.  Findings discussed and questions answered. Daleen Bo   Medical Decision Making: Suprapubic catheter dysfunction, initially not completely in urinary bladder.  Suprapubic catheter changed for possible catheter dysfunction.  After catheter change, she continued to leak some urine out of the urethra therefore suspected to have bladder spasm.  Patient is nontoxic.  Doubt serious bacterial  infection.  Medication started in ED, urine culture sent.  Anticipate follow-up with urology.  Matthias Hughs was evaluated in Emergency Department on 10/26/2019 for the symptoms described in the history of present illness. She was evaluated in the context of the global COVID-19 pandemic, which necessitated consideration that the patient might be at risk for infection with the SARS-CoV-2 virus that causes COVID-19. Institutional protocols and algorithms that pertain to the evaluation of patients at risk for COVID-19 are in a state of rapid change based on information released by regulatory bodies including the CDC and federal and state organizations. These policies and algorithms were followed during the patient's care in the ED.   CRITICAL CARE-no Performed by: Daleen Bo  Nursing Notes Reviewed/ Care Coordinated Applicable Imaging Reviewed Interpretation of Laboratory Data incorporated into ED treatment  The patient appears reasonably screened and/or stabilized for discharge and I doubt any other medical condition or other [facility] requiring further screening, evaluation, or treatment in the ED at this time prior to discharge.  Plan: Home  Medications-continue usual; Home Treatments-, fluids, catheter care; return here if the recommended treatment, does not improve the symptoms; Recommended follow up-data urology follow-up 1 week.\   Final Clinical Impressions(s) / ED Diagnoses   Final diagnoses:  Lower urinary tract infectious disease  Bladder spasm  Suprapubic catheter dysfunction, initial encounter [facility])    ED Discharge Orders         Ordered    cephALEXin (KEFLEX) 250 MG capsule  4 times daily     10/25/19 1604    oxybutynin (DITROPAN) 5 MG tablet  Every 8 hours PRN     10/25/19 1604           Mancel Bale, MD 10/26/19 1011

## 2019-10-25 NOTE — ED Triage Notes (Signed)
Pt has a suprapubic catheter [facility] at [facility] on Wednesday. Nurse has difficulty inserting but able to get urine return in bag. Some leakage from urethra. Daughter reports increase urinary drainage from urethra and decrease drainage in bag. Now has burning with urination

## 2019-10-28 LAB — URINE CULTURE: Culture: >=100000 — AB

## 2019-10-29 ENCOUNTER — Ambulatory Visit (INDEPENDENT_AMBULATORY_CARE_PROVIDER_SITE_OTHER): Payer: Medicare Other | Admitting: Urology

## 2019-10-29 ENCOUNTER — Telehealth: Payer: Self-pay | Admitting: Emergency Medicine

## 2019-10-29 DIAGNOSIS — R339 Retention of urine, unspecified: Secondary | ICD-10-CM | POA: Diagnosis not present

## 2019-10-29 NOTE — Telephone Encounter (Signed)
Post ED Visit - Positive Culture Follow-up  Culture report reviewed by antimicrobial stewardship pharmacist: [place] Team [x]  Elenor Quinones, Pharm.D. []  Heide Guile, Pharm.D., BCPS AQ-ID []  Parks Neptune, Pharm.D., BCPS []  Alycia Rossetti, Pharm.D., BCPS []  Fonda, [place].D., BCPS, AAHIVP []  Legrand Como, Pharm.D., BCPS, AAHIVP []  Salome Arnt, PharmD, BCPS []  Johnnette Gourd, PharmD, BCPS []  Hughes Better, PharmD, BCPS []  Leeroy Cha, PharmD []  Laqueta Linden, PharmD, BCPS []  Albertina Parr, PharmD  [place] Team []  Leodis Sias, PharmD []  Lindell Spar, PharmD []  Royetta Asal, PharmD []  Graylin Shiver, Rph []  Rema Fendt) Glennon Mac, PharmD []  Arlyn Dunning, PharmD []  Netta Cedars, PharmD []  Dia Sitter, PharmD []  Leone Haven, PharmD []  Gretta Arab, PharmD []  Theodis Shove, PharmD []  Peggyann Juba, PharmD []  Reuel Boom, PharmD   Positive urine culture Treated with cephalexin, organism sensitive to the same and no further patient follow-up is required at this time.  Hazle Nordmann 10/29/2019, 11:30 AM

## 2019-12-04 ENCOUNTER — Other Ambulatory Visit: Payer: Self-pay

## 2019-12-04 ENCOUNTER — Encounter: Payer: Self-pay | Admitting: Urology

## 2019-12-04 ENCOUNTER — Ambulatory Visit (INDEPENDENT_AMBULATORY_CARE_PROVIDER_SITE_OTHER): Payer: Medicare Other | Admitting: Urology

## 2019-12-04 VITALS — BP 141/82 | HR 69 | Temp 97.5°F | Ht 63.5 in | Wt 210.0 lb

## 2019-12-04 DIAGNOSIS — R339 Retention of urine, unspecified: Secondary | ICD-10-CM | POA: Diagnosis not present

## 2019-12-04 DIAGNOSIS — Z435 Encounter for attention to cystostomy: Secondary | ICD-10-CM | POA: Diagnosis not present

## 2019-12-04 NOTE — Progress Notes (Signed)
Simple Catheter Placement  Due to urinary retention patient is present today for a foley cath placement.  Patient was cleaned and prepped in a sterile fashion with betadine and lidocaine jelly 2% was instilled into the urethra.  A 16 FR foley catheter was inserted, urine return was noted  36ml, urine was cloudy in color.  The balloon was filled with 10cc of sterile water.  A bedside bag was attached for drainage. Patient was given instruction on proper catheter care.  Patient tolerated well, no complications were noted   Performed by: Pankaj Haack LPN

## 2019-12-04 NOTE — Progress Notes (Signed)
12/04/2019 10:18 AM   [name] [birth year] [id number]  Referring provider: Kirby Funk, MD [address] [place],  [place] [address]  Suprapubic tube dysfunction  HPI: [name] is a 86yo with a hx of urinary retention managed with SP tube. SP tube became dislodged and bladder is now managed with indwelling foley. No pelvic pain. No hematuria. No LUTS. No UTIs   PMH: Past Medical History:  Diagnosis Date  . Chest pain   . Depression   . GERD (gastroesophageal reflux disease)   . Hypertension   . Sleep apnea   . Thyroid disease   . Trigeminal neuralgia     Surgical History: Past Surgical History:  Procedure Laterality Date  . APPENDECTOMY  1946  . CATARACT EXTRACTION     bilateral  . CYSTOSCOPY N/A 02/11/2019   Procedure: CYSTOSCOPY;  Surgeon: Malen Gauze, MD;  [facility];  Service: Urology;  Laterality: N/A;  . GALLBLADDER SURGERY    . INCISION AND DRAINAGE ABSCESS N/A 06/29/2018   Procedure: REPEAT INCISION AND DRAINAGE  BUTTOCK ABSCESS;  Surgeon: Harriette Bouillon, MD;  [facility];  Service: General;  Laterality: N/A;  . INCISION AND DRAINAGE PERIRECTAL ABSCESS Left 06/27/2018   Procedure: IRRIGATION AND DEBRIDEMENT BUTTOCK ABSCESS;  Surgeon: Andria Meuse, MD;  [facility];  Service: General;  Laterality: Left;  . INSERTION OF SUPRAPUBIC CATHETER N/A 07/03/2018   Procedure: INSERTION OF SUPRAPUBIC CATHETER;  Surgeon: Crista Elliot, MD;  Location: [facility] OR;  Service: Urology;  Laterality: N/A;  . INSERTION OF SUPRAPUBIC CATHETER N/A 02/11/2019   Procedure: INSERTION OF SUPRAPUBIC CATHETER;  Surgeon: Malen Gauze, MD;  [facility];  Service: Urology;  Laterality: N/A;  30 MINS  . KNEE ARTHROPLASTY  2006   left   . KNEE ARTHROPLASTY  2006   right   . LAPAROSCOPIC DIVERTED COLOSTOMY N/A 07/03/2018   Procedure: LAPAROSCOPIC DIVERTED COLOSTOMY;  Surgeon: Axel Filler, MD;  Location: [facility] OR;  Service: General;   Laterality: N/A;  . SHOULDER ARTHROSCOPY  2009   right  . TOOTH EXTRACTION    . TOTAL HIP ARTHROPLASTY  2002    left    Home Medications:  Allergies as of 12/04/2019      Reactions   Morphine And Related Hives, Itching   Tramadol Other (See Comments)   vertigo   Codeine Anxiety   Very agitated then unable to arouse   Gabapentin Other (See Comments)   Alters mood   Lipitor [atorvastatin] Itching, Rash   Lyrica [pregabalin] Other (See Comments)   Lethargic, mood changes   Motrin [ibuprofen] Anxiety   Agitated    Sulfa Antibiotics Rash   Tylenol [acetaminophen]       Medication List       Accurate as of December 04, 2019 10:18 AM. If you have any questions, ask your nurse or doctor.        acetaminophen 325 MG tablet Commonly known as: TYLENOL Take 2 tablets (650 mg total) by mouth every 6 (six) hours as needed for mild pain (or Fever >/= 101).   albuterol 108 (90 Base) MCG/ACT inhaler Commonly known as: VENTOLIN HFA Inhale 2 puffs into the lungs every 6 (six) hours as needed for wheezing or shortness of breath.   allopurinol 100 MG tablet Commonly known as: ZYLOPRIM Take 1 tablet (100 mg total) by mouth daily. What changed: when to take this   ALPRAZolam 0.5 MG tablet Commonly known as:  XANAX Take 0.5 mg by mouth at bedtime.   aspirin EC 81 MG tablet Take 81 mg by mouth daily.   calcium citrate-vitamin D 315-200 MG-UNIT tablet Commonly known as: CITRACAL+D Take 1 tablet by mouth daily.   carbamazepine 100 MG chewable tablet Commonly known as: TEGRETOL Chew 1 tablet (100 mg total) by mouth 2 (two) times daily.   carbamazepine 200 MG tablet Commonly known as: TEGRETOL Take 1 tablet (200 mg total) by mouth 2 (two) times daily.   cephALEXin 250 MG capsule Commonly known as: KEFLEX Take 1 capsule (250 mg total) by mouth 4 (four) times daily.   furosemide 40 MG tablet Commonly known as: LASIX Take 1 tablet (40 mg total) by mouth daily. What changed: when  to take this   guaiFENesin 600 MG 12 hr tablet Commonly known as: MUCINEX Take 1 tablet (600 mg total) by mouth 2 (two) times daily as needed for cough or to loosen phlegm.   levothyroxine 137 MCG tablet Commonly known as: SYNTHROID Take 1 tablet (137 mcg total) by mouth daily before breakfast.   meclizine 12.5 MG tablet Commonly known as: ANTIVERT Take 12.5 mg by mouth 3 (three) times daily as needed for dizziness.   multivitamin with minerals Tabs tablet Take 1 tablet by mouth daily.   nitroGLYCERIN 0.4 MG SL tablet Commonly known as: NITROSTAT   omeprazole 40 MG capsule Commonly known as: PRILOSEC Take 1 capsule (40 mg total) by mouth daily. What changed: when to take this   oxybutynin 5 MG tablet Commonly known as: DITROPAN Take 0.5 tablets (2.5 mg total) by mouth every 8 (eight) hours as needed for bladder spasms.   sertraline 50 MG tablet Commonly known as: ZOLOFT Take 1 tablet (50 mg total) by mouth daily.   traZODone 50 MG tablet Commonly known as: DESYREL Take 50 mg by mouth at bedtime.       Allergies:  Allergies  Allergen Reactions  . Morphine And Related Hives and Itching  . Tramadol Other (See Comments)    vertigo  . Codeine Anxiety    Very agitated then unable to arouse  . Gabapentin Other (See Comments)    Alters mood  . Lipitor [Atorvastatin] Itching and Rash  . Lyrica [Pregabalin] Other (See Comments)    Lethargic, mood changes  . Motrin [Ibuprofen] Anxiety    Agitated   . Sulfa Antibiotics Rash  . Tylenol [Acetaminophen]     Family History: Family History  Problem Relation Age of Onset  . Diabetes Mother   . Stroke Mother   . Cancer Brother   . Kidney disease Sister     Social History:  reports that she has quit smoking. Her smoking use included cigarettes. She has never used smokeless tobacco. She reports that she does not drink alcohol or use drugs.  ROS:                                         Physical Exam: BP (!) 141/82   Pulse 69   Temp (!) 97.5 F (36.4 C)   Ht 5' 3.5" (1.613 m)   Wt 210 lb (95.3 kg)   BMI 36.62 kg/m   Constitutional:  Alert and oriented, No acute distress. HEENT: [place] AT, moist mucus membranes.  Trachea midline, no masses. Cardiovascular: No clubbing, cyanosis, or edema. Respiratory: Normal respiratory effort, no increased work of breathing. GI: Abdomen is soft,  nontender, nondistended, no abdominal masses GU: No CVA tenderness Lymph: No cervical or inguinal lymphadenopathy. Skin: No rashes, bruises or suspicious lesions. Neurologic: Grossly intact, no focal deficits, moving all 4 extremities. Psychiatric: Normal mood and affect.  Laboratory Data: Lab Results  Component Value Date   WBC 8.5 07/19/2018   HGB 9.1 (L) 07/19/2018   HCT 30.3 (L) 07/19/2018   MCV 96.8 07/19/2018   PLT 204 07/19/2018    Lab Results  Component Value Date   CREATININE 1.57 (H) 02/06/2019    No results found for: PSA  No results found for: TESTOSTERONE  No results found for: HGBA1C  Urinalysis    Component Value Date/Time   COLORURINE YELLOW 10/25/2019 1018   APPEARANCEUR HAZY (A) 10/25/2019 1018   LABSPEC 1.010 10/25/2019 1018   PHURINE 6.0 10/25/2019 1018   GLUCOSEU NEGATIVE 10/25/2019 1018   HGBUR LARGE (A) 10/25/2019 1018   BILIRUBINUR NEGATIVE 10/25/2019 1018   KETONESUR NEGATIVE 10/25/2019 1018   PROTEINUR 30 (A) 10/25/2019 1018   NITRITE NEGATIVE 10/25/2019 1018   LEUKOCYTESUR LARGE (A) 10/25/2019 1018    Lab Results  Component Value Date   BACTERIA RARE (A) 10/25/2019    Pertinent Imaging:  No results found for this or any previous visit. No results found for this or any previous visit. No results found for this or any previous visit. No results found for this or any previous visit. No results found for this or any previous visit. No results found for this or any previous visit. No results found for this or any previous visit. No  results found for this or any previous visit.  Assessment & Plan:    1. Urinary retention -foley catheter changed today -patient to be scheduled fro IR placement of SP tube  2. SP tube dysfunction: -Schedule for IR placement  No follow-ups on file.  Nicolette Bang, MD  [facility] [place]

## 2019-12-04 NOTE — Patient Instructions (Signed)
Acute Urinary Retention, Female  Acute urinary retention means that you cannot pee (urinate) at all, or that you pee too little and your bladder is not emptied completely. If it is not treated, it can lead to kidney damage or other serious problems. Follow these instructions at home:  Take over-the-counter and prescription medicines only as told by your doctor. Ask your doctor what medicines you should stay away from. Do not take any medicine unless your doctor says it is okay to do so.  If you were sent home with a tube that drains pee from the bladder (catheter), take care of it as told by your doctor.  Drink enough fluid to keep your pee clear or pale yellow.  If you were given an antibiotic, take it as told by your doctor. Do not stop taking the antibiotic even if you start to feel better.  Do not use any products that contain nicotine or tobacco, such as cigarettes and e-cigarettes. If you need help quitting, ask your doctor.  Watch for changes in your symptoms. Tell your doctor about them.  If told, keep track of any changes in your blood pressure at home. Tell your doctor about them.  Keep all follow-up visits as told by your doctor. This is important. Contact a doctor if:  You have spasms or you leak pee when you have spasms. Get help right away if:  You have chills or a fever.  You have blood in your pee.  You have a tube that drains the bladder and: ? The tube stops draining pee. ? The tube falls out. Summary  Acute urinary retention means that you cannot pee at all, or that you pee too little and your bladder is not emptied completely. If it is not treated, it can result in kidney damage or other serious problems.  If you were sent home with a tube that drains pee from the bladder, take care of it as told by your doctor.  Pay attention to any changes in your symptoms. Tell your doctor about them. This information is not intended to replace advice given to you by your  health care provider. Make sure you discuss any questions you have with your health care provider. Document Revised: 10/27/2017 Document Reviewed: 12/16/2016 Elsevier Patient Education  2020 Elsevier Inc.  

## 2019-12-11 NOTE — Addendum Note (Signed)
Addended by: Malen Gauze on: 12/11/2019 10:31 AM   Modules accepted: Orders

## 2019-12-13 ENCOUNTER — Other Ambulatory Visit: Payer: Self-pay

## 2019-12-13 ENCOUNTER — Encounter (HOSPITAL_COMMUNITY): Payer: Self-pay | Admitting: Radiology

## 2019-12-13 DIAGNOSIS — Z435 Encounter for attention to cystostomy: Secondary | ICD-10-CM

## 2019-12-13 DIAGNOSIS — R339 Retention of urine, unspecified: Secondary | ICD-10-CM

## 2019-12-13 NOTE — Progress Notes (Signed)
Phyillis B. Iwan Female, 86 y.o., 11/11/1928 MRN:  [id number] Phone:  [phone] Judie Petit) PCP:  Kirby Funk, MD Coverage:  [place] [employer] Medicare/[employer] Medicare Next Appt With Neurology 02/26/2020 at 1:30 PM  RE: CT Guided Drainage Received: Today Message Contents  Malachy Moan, MD  Henry Russel D  Approved for CT guided placement of suprapubic catheter. Please schedule at [facility] .   HKM       Previous Messages   ----- Message -----  From: Henry Russel D  Sent: 12/13/2019  3:48 PM EST  To: [facility] Procedure Requests  Subject: CT Guided Drainage                Procedure:  CT IMAGE GUIDED DRAINAGE BY PERCUTANEOUS CATHETER   Reason:  Urinary retention, Encounter for care or replacement of suprapubic tube, suprapubic tube placement   History: no prior imaging   Provider: Malen Gauze   [phone]

## 2019-12-17 ENCOUNTER — Other Ambulatory Visit (HOSPITAL_COMMUNITY)
Admission: RE | Admit: 2019-12-17 | Discharge: 2019-12-17 | Disposition: A | Discharge status: Home / Self Care | Payer: Medicare Other | Source: Ambulatory Visit | Attending: Urology | Admitting: Urology

## 2019-12-17 DIAGNOSIS — Z20822 Contact with and (suspected) exposure to covid-19: Secondary | ICD-10-CM | POA: Diagnosis not present

## 2019-12-17 DIAGNOSIS — Z01812 Encounter for preprocedural laboratory examination: Secondary | ICD-10-CM | POA: Diagnosis present

## 2019-12-18 LAB — NOVEL CORONAVIRUS, NAA (HOSP ORDER, SEND-OUT TO REF LAB; TAT 18-24 HRS): SARS-CoV-2, NAA: NOT DETECTED

## 2019-12-19 ENCOUNTER — Other Ambulatory Visit: Payer: Self-pay | Admitting: Radiology

## 2019-12-20 ENCOUNTER — Other Ambulatory Visit: Payer: Self-pay

## 2019-12-20 ENCOUNTER — Other Ambulatory Visit (HOSPITAL_COMMUNITY): Payer: Self-pay | Admitting: Interventional Radiology

## 2019-12-20 ENCOUNTER — Ambulatory Visit (HOSPITAL_COMMUNITY)
Admission: RE | Admit: 2019-12-20 | Discharge: 2019-12-20 | Disposition: A | Discharge status: Home / Self Care | Payer: Medicare Other | Source: Ambulatory Visit | Attending: Urology | Admitting: Urology

## 2019-12-20 ENCOUNTER — Encounter (HOSPITAL_COMMUNITY): Payer: Self-pay

## 2019-12-20 DIAGNOSIS — R339 Retention of urine, unspecified: Secondary | ICD-10-CM | POA: Insufficient documentation

## 2019-12-20 DIAGNOSIS — K219 Gastro-esophageal reflux disease without esophagitis: Secondary | ICD-10-CM | POA: Diagnosis not present

## 2019-12-20 DIAGNOSIS — G500 Trigeminal neuralgia: Secondary | ICD-10-CM | POA: Insufficient documentation

## 2019-12-20 DIAGNOSIS — G4730 Sleep apnea, unspecified: Secondary | ICD-10-CM | POA: Insufficient documentation

## 2019-12-20 DIAGNOSIS — Z87891 Personal history of nicotine dependence: Secondary | ICD-10-CM | POA: Diagnosis not present

## 2019-12-20 DIAGNOSIS — Z79890 Hormone replacement therapy (postmenopausal): Secondary | ICD-10-CM | POA: Insufficient documentation

## 2019-12-20 DIAGNOSIS — I10 Essential (primary) hypertension: Secondary | ICD-10-CM | POA: Insufficient documentation

## 2019-12-20 DIAGNOSIS — E079 Disorder of thyroid, unspecified: Secondary | ICD-10-CM | POA: Diagnosis not present

## 2019-12-20 DIAGNOSIS — Z435 Encounter for attention to cystostomy: Secondary | ICD-10-CM

## 2019-12-20 DIAGNOSIS — Z79899 Other long term (current) drug therapy: Secondary | ICD-10-CM | POA: Insufficient documentation

## 2019-12-20 DIAGNOSIS — Z7982 Long term (current) use of aspirin: Secondary | ICD-10-CM | POA: Diagnosis not present

## 2019-12-20 DIAGNOSIS — F329 Major depressive disorder, single episode, unspecified: Secondary | ICD-10-CM | POA: Diagnosis not present

## 2019-12-20 LAB — BASIC METABOLIC PANEL
Anion gap: 10 (ref 5–15)
BUN: 36 mg/dL — ABNORMAL HIGH (ref 8–23)
CO2: 27 mmol/L (ref 22–32)
Calcium: 9.3 mg/dL (ref 8.9–10.3)
Chloride: 100 mmol/L (ref 98–111)
Creatinine, Ser: 1.22 mg/dL — ABNORMAL HIGH (ref 0.44–1.00)
GFR calc Af Amer: 45 mL/min — ABNORMAL LOW (ref >60)
GFR calc non Af Amer: 39 mL/min — ABNORMAL LOW (ref >60)
Glucose, Bld: 120 mg/dL — ABNORMAL HIGH (ref 70–99)
Potassium: 4 mmol/L (ref 3.5–5.1)
Sodium: 137 mmol/L (ref 135–145)

## 2019-12-20 LAB — CBC WITH DIFFERENTIAL/PLATELET
Abs Immature Granulocytes: 0.06 K/uL (ref 0.00–0.07)
Basophils Absolute: 0.1 K/uL (ref 0.0–0.1)
Basophils Relative: 1 %
Eosinophils Absolute: 0.3 K/uL (ref 0.0–0.5)
Eosinophils Relative: 3 %
HCT: 40.7 % (ref 36.0–46.0)
Hemoglobin: 13.5 g/dL (ref 12.0–15.0)
Immature Granulocytes: 1 %
Lymphocytes Relative: 25 %
Lymphs Abs: 2.4 K/uL (ref 0.7–4.0)
MCH: 31.9 pg (ref 26.0–34.0)
MCHC: 33.2 g/dL (ref 30.0–36.0)
MCV: 96.2 fL (ref 80.0–100.0)
Monocytes Absolute: 0.9 K/uL (ref 0.1–1.0)
Monocytes Relative: 10 %
Neutro Abs: 5.8 K/uL (ref 1.7–7.7)
Neutrophils Relative %: 60 %
Platelets: 147 K/uL — ABNORMAL LOW (ref 150–400)
RBC: 4.23 MIL/uL (ref 3.87–5.11)
RDW: 14.6 % (ref 11.5–15.5)
WBC: 9.6 K/uL (ref 4.0–10.5)
nRBC: 0 % (ref 0.0–0.2)

## 2019-12-20 LAB — PROTIME-INR
INR: 0.9 (ref 0.8–1.2)
Prothrombin Time: 12 s (ref 11.4–15.2)

## 2019-12-20 MED ORDER — DIPHENHYDRAMINE HCL 50 MG/ML IJ SOLN: 25.0000 mg | INTRAMUSCULAR | Status: DC

## 2019-12-20 MED ORDER — MIDAZOLAM HCL 2 MG/2ML IJ SOLN
INTRAMUSCULAR | Status: AC | PRN
  Administered 2019-12-20 (×2): 1 mg via INTRAVENOUS

## 2019-12-20 MED ORDER — FENTANYL CITRATE (PF) 100 MCG/2ML IJ SOLN
INTRAMUSCULAR | Status: AC | PRN
  Administered 2019-12-20 (×2): 50 ug via INTRAVENOUS

## 2019-12-20 MED ORDER — SODIUM CHLORIDE 0.9 % IV SOLN
INTRAVENOUS | Status: AC
  Filled 2019-12-20: qty 250

## 2019-12-20 MED ORDER — CIPROFLOXACIN IN D5W 400 MG/200ML IV SOLN: 400.0000 mg | INTRAVENOUS | Status: AC

## 2019-12-20 MED ORDER — DIPHENHYDRAMINE HCL 50 MG/ML IJ SOLN
INTRAMUSCULAR | Status: AC
  Filled 2019-12-20: qty 1

## 2019-12-20 MED ORDER — FENTANYL CITRATE (PF) 100 MCG/2ML IJ SOLN
INTRAMUSCULAR | Status: AC
  Filled 2019-12-20: qty 2

## 2019-12-20 MED ORDER — MIDAZOLAM HCL 2 MG/2ML IJ SOLN
INTRAMUSCULAR | Status: AC
  Filled 2019-12-20: qty 4

## 2019-12-20 MED ORDER — SODIUM CHLORIDE 0.9 % IV SOLN: INTRAVENOUS | Status: DC

## 2019-12-20 MED ORDER — CIPROFLOXACIN IN D5W 400 MG/200ML IV SOLN
INTRAVENOUS | Status: AC
  Administered 2019-12-20: 400 mg via INTRAVENOUS
  Filled 2019-12-20: qty 200

## 2019-12-20 NOTE — Discharge Instructions (Signed)
Suprapubic Catheter Home Guide °A suprapubic catheter is a flexible tube that is used to drain urine from the bladder into a collection bag outside the body. The catheter is inserted into the bladder through a small opening in the lower abdomen, above the pubic bone (suprapubic area) and a few inches below your belly button (navel). A tiny balloon filled with germ-free (sterile) water helps to keep the catheter in place. °The collection bag must be emptied at least once a day and cleaned at least every other day. The collection bag can be put beside your bed at night and attached to your leg during the day. You may have a large collection bag to use at night and a smaller one to use during the day. °Your suprapubic catheter may need to be changed every 4-6 weeks, or as often as recommended by your health care provider. Healing of the tract where the catheter is placed can take 6 weeks to 6 months. During that time, your health care provider may change your catheter. Once the tract is well healed, you or a caregiver will change your suprapubic catheter at home. °What are the risks? °This catheter is safe to use. However, problems can occur, including: °· Blocked urine flow. This can occur if the catheter stops working, or if you have a blood clot in your bladder or in the catheter. °· Irritation of the skin around the catheter. °· Infection. This can happen if bacteria gets into your bladder. °Supplies needed: °· Two pairs of sterile gloves. °· Paper towels. °· Catheter. °· Two syringes. °· Sterile water. °· Sterile cleaning solution. °· Lubricant. °· Collection bags. °How to change the catheter ° °1. Drink plenty of fluids during the hours before you change the catheter. °2. Wash your hands with soap and water. If soap and water are not available, use hand sanitizer. °3. Draw up sterile water into a syringe to have ready to fill the new catheter balloon. The amount will depend on the size of the balloon. °4. Have  all of your supplies ready and close to you on a paper towel. °5. Lie on your back, sitting slightly upright so that you can see the catheter and opening. °6. Put on sterile gloves. °7. Clean the skin around the catheter opening using the sterile cleaning solution. °8. Remove the water from the balloon in the catheter using a syringe. °9. Slowly remove the catheter. If the catheter seems stuck, or if you have difficulty removing it: °? Do not pull on it. °? Call your health care provider right away. °10. Place the old catheter on a paper towel to discard later. °11. Take off the used gloves, and put on a new pair. °12. Put lubricant on the end of the new catheter that will go into your bladder. °13. Clean the skin around the catheter opening using the sterile cleaning solution. °14. Gently slide the catheter through the opening in your abdomen and into the tract that leads to your bladder. °15. Wait for some urine to start flowing through the catheter. °16. When urine starts to flow through the catheter, attach the collection bag to the end of the catheter. Make sure the connection is tight. °17. Use a syringe to fill the catheter balloon with sterile water. Fill to the amount directed by your health care provider. °18. Remove the gloves and wash your hands with soap and water. °How to care for the skin around the catheter °Follow your health care provider's instructions on   caring for your skin. °· Use a clean washcloth and soapy water to clean the skin around your catheter every day. Pat the area dry with a clean paper towel. °· Do not pull on the catheter. °· Do not use ointment or lotion on this area, unless told by your health care provider. °· Check the skin around the catheter every day for signs of infection. Check for: °? Redness, swelling, or pain. °? Fluid or blood. °? Warmth. °? Pus or a bad smell. °How to empty and clean the collection bag °Empty the large collection bag every 8 hours. Empty the small  collection bag when it is about ? full. Clean the collection bag every 2-3 days, or as often as told by your health care provider. To do this: °1. Wash your hands with soap and water. If soap and water are not available, use hand sanitizer. °2. Disconnect the bag from the catheter and immediately attach a new bag to the catheter. °3. Hold the used bag over the toilet or another container. °4. Turn the valve (spigot) at the bottom of the bag to empty the urine. Empty the used bag completely. °? Do not touch the opening of the spigot. °? Do not let the opening touch the toilet or container. °5. Close the spigot tightly when the bag is empty. °6. Clean the used bag in one of the following methods: °? According to the manufacturer's instructions. °? As told by your health care provider. °7. Let the bag dry completely. Put it in a clean plastic bag before storing it. °General tips ° °· Always wash your hands before and after caring for your catheter and collection bag. Use a mild, fragrance-free soap. If soap and water are not available, use hand sanitizer. °· Clean the outside of the catheter with soap and water as often as told by your health care provider. °· Always make sure there are no twists or kinks in the catheter tube. °· Always make sure there are no leaks in the catheter or collection bag. °· Always wear the leg bag below your knee. °· Make sure the overnight drainage bag is always lower than the level of your bladder, but do not let it touch the floor. Before you go to sleep, hang the bag inside a wastebasket that is covered by a clean plastic bag. °· Drink enough fluid to keep your urine pale yellow. °· Do not take baths, swim, or use a hot tub until your health care provider approves. Ask your health care provider if you may take showers. °Contact a heath care provider if: °· You leak urine. °· You have redness, swelling, or pain around your catheter. °· You have fluid or blood coming from your catheter  opening. °· Your catheter opening feels warm to the touch. °· You have pus or a bad smell coming from your catheter opening. °· You have a fever or chills. °· Your urine flow slows down. °· Your urine becomes cloudy or smelly. °Get help right away if: °· Your catheter comes out. °· You have: °? Nausea. °? Back pain. °? Difficulty changing your catheter. °? Blood in your urine. °? No urine flow for 1 hour. °Summary °· A suprapubic catheter is a flexible tube that is used to drain urine from the bladder into a collection bag outside the body. °· Your suprapubic catheter may need to be changed every 4-6 weeks, or as recommended by your health care provider. °· Follow instructions on how to   change the catheter and how to empty and clean the collection bag. °· Always wash your hands before and after caring for your catheter and collection bag. Drink enough fluid to keep your urine pale yellow. °· Get help right away if you have difficulty changing your catheter or if there is blood in your urine. °This information is not intended to replace advice given to you by your health care provider. Make sure you discuss any questions you have with your health care provider. °Document Revised: 03/07/2019 Document Reviewed: 12/19/2018 °Elsevier Patient Education © 2020 Elsevier Inc. °Moderate Conscious Sedation, Adult, Care After °These instructions provide you with information about caring for yourself after your procedure. Your health care provider may also give you more specific instructions. Your treatment has been planned according to current medical practices, but problems sometimes occur. Call your health care provider if you have any problems or questions after your procedure. °What can I expect after the procedure? °After your procedure, it is common: °· To feel sleepy for several hours. °· To feel clumsy and have poor balance for several hours. °· To have poor judgment for several hours. °· To vomit if you eat too  soon. °Follow these instructions at home: °For at least 24 hours after the procedure: ° °· Do not: °? Participate in activities where you could fall or become injured. °? Drive. °? Use heavy machinery. °? Drink alcohol. °? Take sleeping pills or medicines that cause drowsiness. °? Make important decisions or sign legal documents. °? Take care of children on your own. °· Rest. °Eating and drinking °· Follow the diet recommended by your health care provider. °· If you vomit: °? Drink water, juice, or soup when you can drink without vomiting. °? Make sure you have little or no nausea before eating solid foods. °General instructions °· Have a responsible adult stay with you until you are awake and alert. °· Take over-the-counter and prescription medicines only as told by your health care provider. °· If you smoke, do not smoke without supervision. °· Keep all follow-up visits as told by your health care provider. This is important. °Contact a health care provider if: °· You keep feeling nauseous or you keep vomiting. °· You feel light-headed. °· You develop a rash. °· You have a fever. °Get help right away if: °· You have trouble breathing. °This information is not intended to replace advice given to you by your health care provider. Make sure you discuss any questions you have with your health care provider. °Document Revised: 10/27/2017 Document Reviewed: 03/05/2016 °Elsevier Patient Education © 2020 Elsevier Inc. ° °

## 2019-12-20 NOTE — Procedures (Signed)
Pre procedural Dx: Urinary retention Post procedural Dx: Same  Technically successful CT guided placed of a 14 Fr drainage catheter placement into the urinary bladder.   EBL: None Complications: None immediate  Katherina Right, MD Pager #: [phone]

## 2019-12-20 NOTE — Consult Note (Signed)
Chief Complaint: Patient was seen in consultation today for suprapubic catheter placement  Referring Physician(s): McKenzie,Patrick L   Supervising Physician: Simonne Come  Patient Status: [facility] - Out-pt  History of Present Illness: [name] is a 86 y.o. female with history of urinary retention which was managed with suprapubic tube ,however tube has become dislodged and patient now has indwelling Foley catheter.  She presents today for new suprapubic catheter placement.  Additional medical history as below.  Past Medical History:  Diagnosis Date  . Chest pain   . Depression   . GERD (gastroesophageal reflux disease)   . Hypertension   . Sleep apnea   . Thyroid disease   . Trigeminal neuralgia     Past Surgical History:  Procedure Laterality Date  . APPENDECTOMY  1946  . CATARACT EXTRACTION     bilateral  . CYSTOSCOPY N/A 02/11/2019   Procedure: CYSTOSCOPY;  Surgeon: Malen Gauze, MD;  Location: [facility] ORS;  Service: Urology;  Laterality: N/A;  . GALLBLADDER SURGERY    . INCISION AND DRAINAGE ABSCESS N/A 06/29/2018   Procedure: REPEAT INCISION AND DRAINAGE  BUTTOCK ABSCESS;  Surgeon: Harriette Bouillon, MD;  Location: [facility] OR;  Service: General;  Laterality: N/A;  . INCISION AND DRAINAGE PERIRECTAL ABSCESS Left 06/27/2018   Procedure: IRRIGATION AND DEBRIDEMENT BUTTOCK ABSCESS;  Surgeon: Andria Meuse, MD;  Location: [facility] OR;  Service: General;  Laterality: Left;  . INSERTION OF SUPRAPUBIC CATHETER N/A 07/03/2018   Procedure: INSERTION OF SUPRAPUBIC CATHETER;  Surgeon: Crista Elliot, MD;  Location: [facility] OR;  Service: Urology;  Laterality: N/A;  . INSERTION OF SUPRAPUBIC CATHETER N/A 02/11/2019   Procedure: INSERTION OF SUPRAPUBIC CATHETER;  Surgeon: Malen Gauze, MD;  Location: [facility] ORS;  Service: Urology;  Laterality: N/A;  30 MINS  . KNEE ARTHROPLASTY  2006   left   . KNEE ARTHROPLASTY  2006   right   . LAPAROSCOPIC DIVERTED COLOSTOMY N/A 07/03/2018   Procedure: LAPAROSCOPIC DIVERTED COLOSTOMY;  Surgeon: Axel Filler, MD;  Location: [facility] OR;  Service: General;  Laterality: N/A;  . SHOULDER ARTHROSCOPY  2009   right  . TOOTH EXTRACTION    . TOTAL HIP ARTHROPLASTY  2002    left    Allergies: Morphine and related, Tramadol, Codeine, Gabapentin, Lipitor [atorvastatin], Lyrica [pregabalin], Motrin [ibuprofen], Sulfa antibiotics, and Tylenol [acetaminophen]  Medications: Prior to Admission medications   Medication Sig Start Date End Date Taking? Authorizing Provider  albuterol (PROVENTIL HFA;VENTOLIN HFA) 108 (90 Base) MCG/ACT inhaler Inhale 2 puffs into the lungs every 6 (six) hours as needed for wheezing or shortness of breath. 07/19/18  Yes Angiulli, Mcarthur Rossetti, PA-C  allopurinol (ZYLOPRIM) 100 MG tablet Take 1 tablet (100 mg total) by mouth daily. Patient taking differently: Take 100 mg by mouth 2 (two) times daily.  07/19/18  Yes Angiulli, Mcarthur Rossetti, PA-C  ALPRAZolam Prudy Feeler) 0.5 MG tablet Take 0.5 mg by mouth at bedtime. 01/21/19  Yes [provider]  aspirin EC 81 MG tablet Take 81 mg by mouth daily.   Yes [provider]  carbamazepine (TEGRETOL) 200 MG tablet Take 1 tablet (200 mg total) by mouth 2 (two) times daily. 08/28/19  Yes Penumalli, Glenford Bayley, MD  furosemide (LASIX) 40 MG tablet Take 1 tablet (40 mg total) by mouth daily. Patient taking differently: Take 40 mg by mouth 2 (two) times daily.  07/19/18  Yes Angiulli, Mcarthur Rossetti, PA-C  guaiFENesin (MUCINEX) 600 MG 12 hr tablet Take 1 tablet (  600 mg total) by mouth 2 (two) times daily as needed for cough or to loosen phlegm. 03/09/16  Yes Robbie Lis, MD  levothyroxine (SYNTHROID, LEVOTHROID) 137 MCG tablet Take 1 tablet (137 mcg total) by mouth daily before breakfast. 07/19/18  Yes Angiulli, Lavon Paganini, PA-C  Multiple Vitamin (MULTIVITAMIN WITH MINERALS) TABS tablet Take 1 tablet by mouth daily.   Yes [provider]  nitroGLYCERIN (NITROSTAT) 0.4 MG SL tablet   03/07/19  Yes [provider]  omeprazole (PRILOSEC) 40 MG capsule Take 1 capsule (40 mg total) by mouth daily. Patient taking differently: Take 40 mg by mouth every other day.  07/19/18  Yes Angiulli, Lavon Paganini, PA-C  sertraline (ZOLOFT) 50 MG tablet Take 1 tablet (50 mg total) by mouth daily. 07/19/18  Yes Angiulli, Lavon Paganini, PA-C  traZODone (DESYREL) 50 MG tablet Take 50 mg by mouth at bedtime. 01/21/19  Yes [provider]  acetaminophen (TYLENOL) 325 MG tablet Take 2 tablets (650 mg total) by mouth every 6 (six) hours as needed for mild pain (or Fever >/= 101). Patient not taking: Reported on 12/04/2019 07/19/18   Cathlyn Parsons, PA-C  calcium citrate-vitamin D (CITRACAL+D) 315-200 MG-UNIT tablet Take 1 tablet by mouth daily. Patient not taking: Reported on 12/04/2019 07/19/18   Angiulli, Lavon Paganini, PA-C  carbamazepine (TEGRETOL) 100 MG chewable tablet Chew 1 tablet (100 mg total) by mouth 2 (two) times daily. Patient not taking: Reported on 12/04/2019 06/27/19   Penumalli, Earlean Polka, MD  cephALEXin (KEFLEX) 250 MG capsule Take 1 capsule (250 mg total) by mouth 4 (four) times daily. Patient not taking: Reported on 12/04/2019 10/25/19   Daleen Bo, MD  meclizine (ANTIVERT) 12.5 MG tablet Take 12.5 mg by mouth 3 (three) times daily as needed for dizziness.    [provider]  oxybutynin (DITROPAN) 5 MG tablet Take 0.5 tablets (2.5 mg total) by mouth every 8 (eight) hours as needed for bladder spasms. 10/25/19   Daleen Bo, MD     Family History  Problem Relation Age of Onset  . Diabetes Mother   . Stroke Mother   . Cancer Brother   . Kidney disease Sister     Social History   Socioeconomic History  . Marital status: Widowed    Spouse name: Not on file  . Number of children: 4  . Years of education: Not on file  . Highest education level: 8th grade  Occupational History  . Occupation: Retired  Tobacco Use  . Smoking status: Former Smoker    Types: Cigarettes   . Smokeless tobacco: Never Used  Substance and Sexual Activity  . Alcohol use: No    Alcohol/week: 0.0 standard drinks  . Drug use: No  . Sexual activity: Not on file  Other Topics Concern  . Not on file  Social History Narrative   Lives w/daughter, Carmen   Caffeine- 2 cups   Social Determinants of Health   Financial Resource Strain:   . Difficulty of Paying Living Expenses: Not on file  Food Insecurity:   . Worried About Charity fundraiser in the Last Year: Not on file  . Ran Out of Food in the Last Year: Not on file  Transportation Needs:   . Lack of Transportation (Medical): Not on file  . Lack of Transportation (Non-Medical): Not on file  Physical Activity:   . Days of Exercise per Week: Not on file  . Minutes of Exercise per Session: Not on file  Stress:   .  Feeling of Stress : Not on file  Social Connections:   . Frequency of Communication with Friends and Family: Not on file  . Frequency of Social Gatherings with Friends and Family: Not on file  . Attends Religious Services: Not on file  . Active Member of Clubs or Organizations: Not on file  . Attends Banker Meetings: Not on file  . Marital Status: Not on file      Review of Systems currently denies fever, headache, chest pain, dyspnea, abdominal/back pain, nausea, vomiting or bleeding.  She does have a chronic cough.  Vital Signs: BP (!) 145/65   Pulse 63   Temp 98.3 F (36.8 C) (Oral)   Resp 18   Ht 5' 3.5" (1.613 m)   Wt 205 lb (93 kg)   SpO2 97%   BMI 35.74 kg/m   Physical Exam awake, alert.  Chest with slightly diminished breath sounds bases.  Heart with normal rate, some ectopy noted.  Abdomen soft, midline colostomy in place, positive bowel sounds, nontender.  Foley catheter in place draining yellow urine.  Bilateral pretibial edema noted.  Imaging: No results found.  Labs:  CBC: Recent Labs    12/20/19 0730  WBC 9.6  HGB 13.5  HCT 40.7  PLT 147*    COAGS: Recent  Labs    12/20/19 0730  INR 0.9    BMP: Recent Labs    02/06/19 1247 12/20/19 0730  NA 138 137  K 3.8 4.0  CL 105 100  CO2 26 27  GLUCOSE 99 120*  BUN 36* 36*  CALCIUM 8.9 9.3  CREATININE 1.57* 1.22*  GFRNONAA 29* 39*  GFRAA 34* 45*    LIVER FUNCTION TESTS: No results for input(s): BILITOT, AST, ALT, ALKPHOS, PROT, ALBUMIN in the last 8760 hours.  TUMOR MARKERS: No results for input(s): AFPTM, CEA, CA199, CHROMGRNA in the last 8760 hours.  Assessment and Plan: 86 y.o. female with history of urinary retention which was managed with suprapubic tube ,however tube has become dislodged and patient now has indwelling Foley catheter.  She presents today for new suprapubic catheter placement.  Details/risks of procedure, including but not limited to, internal bleeding, infection, injury to adjacent structures discussed with patient with her understanding and consent.   Thank you for this interesting consult.  I greatly enjoyed meeting KURSTIN DIMARZO and look forward to participating in their care.  A copy of this report was sent to the requesting provider on this date.  Electronically Signed: D. Jeananne Rama, PA-C 12/20/2019, 8:20 AM   I spent a total of 25 minutes   in face to face in clinical consultation, greater than 50% of which was counseling/coordinating care for suprapubic catheter placement

## 2019-12-26 ENCOUNTER — Telehealth: Payer: Self-pay | Admitting: Diagnostic Neuroimaging

## 2019-12-26 NOTE — Telephone Encounter (Addendum)
[employer], dgtr and asked what dose mom is taking of carbamazepine. She is taking 200 mg two times daily. She had to skip one dose last Fri due to procedure to replace suprapubic catheter. Arline Asp stated her mom is having breakthrough pain that sometimes causes her to cry.  Arline Asp would like to know if dose can be increased or if another medication can be prescribed. Patient has FU end of March. Offered to schedule sooner, but due to patient's age, recent and upcoming dr visits she prefers not to do that at this time. I advised will discuss with Dr Marjory Lies and call her back. She  verbalized understanding, appreciation.

## 2019-12-26 NOTE — Telephone Encounter (Signed)
May go up to carbamazepine 400mg  twice a day. -VRP

## 2019-12-26 NOTE — Telephone Encounter (Signed)
Barnes,Cindy(daughter on DPR)has called to inform RN that the pain meds are no longer working for pt.  Daughter is asking for a call from RN to discuss what can be done re: pt's pain medication

## 2019-12-26 NOTE — Telephone Encounter (Addendum)
[facility] and advised of Dr [employer] advice. Advised she may increase one dose at a time and will touch base to see if she tolerates.  She verbalized understanding, appreciation.

## 2020-01-02 MED ORDER — CARBAMAZEPINE ER 300 MG PO CP12: 300.0000 mg | ORAL_CAPSULE | Freq: Two times a day (BID) | ORAL | 12 refills | Status: DC

## 2020-01-02 NOTE — Addendum Note (Signed)
Addended by: Joycelyn Schmid R on: 01/02/2020 02:14 PM   Modules accepted: Orders

## 2020-01-02 NOTE — Telephone Encounter (Signed)
Patients daughter Arline Asp called in and states she has been taking 300mg  in the morning and 300mg  at night and tylenol mid day with relief .

## 2020-01-02 NOTE — Telephone Encounter (Signed)
Called [place] who stated she needs to call in for refill today but won't until she hears back. She'd like to continue patient on 300 mg twice daily. She's currently breaking a 200 mg tab to give 300 mg twice daily. I advised will let Dr Marjory Lies know a new Rx is needed and call her back. Cindy verbalized understanding, appreciation.

## 2020-01-02 NOTE — Telephone Encounter (Signed)
[facility] and informed her of new Rx sent in. She verbalized understanding, appreciation.

## 2020-01-02 NOTE — Telephone Encounter (Signed)
Meds ordered this encounter  Medications  . Carbamazepine (EQUETRO) 300 MG CP12    Sig: Take 1 capsule (300 mg total) by mouth 2 (two) times daily.    Dispense:  60 capsule    Refill:  12    Suanne Marker, MD 01/02/2020, 2:14 PM Certified in Neurology, Neurophysiology and Neuroimaging  [facility] [address], [address] [place], [place] [address] [phone]

## 2020-01-04 ENCOUNTER — Emergency Department (HOSPITAL_COMMUNITY)
Admission: EM | Admit: 2020-01-04 | Discharge: 2020-01-04 | Disposition: A | Discharge status: Home / Self Care | Payer: Medicare Other | Attending: Emergency Medicine | Admitting: Emergency Medicine

## 2020-01-04 ENCOUNTER — Encounter (HOSPITAL_COMMUNITY): Payer: Self-pay | Admitting: Emergency Medicine

## 2020-01-04 ENCOUNTER — Other Ambulatory Visit: Payer: Self-pay

## 2020-01-04 DIAGNOSIS — Z96642 Presence of left artificial hip joint: Secondary | ICD-10-CM | POA: Insufficient documentation

## 2020-01-04 DIAGNOSIS — E039 Hypothyroidism, unspecified: Secondary | ICD-10-CM | POA: Diagnosis not present

## 2020-01-04 DIAGNOSIS — Z79899 Other long term (current) drug therapy: Secondary | ICD-10-CM | POA: Insufficient documentation

## 2020-01-04 DIAGNOSIS — Z87891 Personal history of nicotine dependence: Secondary | ICD-10-CM | POA: Diagnosis not present

## 2020-01-04 DIAGNOSIS — T83090A Other mechanical complication of cystostomy catheter, initial encounter: Secondary | ICD-10-CM

## 2020-01-04 DIAGNOSIS — I10 Essential (primary) hypertension: Secondary | ICD-10-CM | POA: Insufficient documentation

## 2020-01-04 DIAGNOSIS — Z96653 Presence of artificial knee joint, bilateral: Secondary | ICD-10-CM | POA: Diagnosis not present

## 2020-01-04 DIAGNOSIS — Y732 Prosthetic and other implants, materials and accessory gastroenterology and urology devices associated with adverse incidents: Secondary | ICD-10-CM | POA: Insufficient documentation

## 2020-01-04 DIAGNOSIS — R339 Retention of urine, unspecified: Secondary | ICD-10-CM | POA: Insufficient documentation

## 2020-01-04 DIAGNOSIS — Z7982 Long term (current) use of aspirin: Secondary | ICD-10-CM | POA: Insufficient documentation

## 2020-01-04 LAB — URINALYSIS, ROUTINE W REFLEX MICROSCOPIC
Bacteria, UA: NONE SEEN
Bilirubin Urine: NEGATIVE
Glucose, UA: NEGATIVE mg/dL
Ketones, ur: NEGATIVE mg/dL
Nitrite: NEGATIVE
Protein, ur: NEGATIVE mg/dL
Specific Gravity, Urine: 1.004 — ABNORMAL LOW (ref 1.005–1.030)
pH: 7 (ref 5.0–8.0)

## 2020-01-04 LAB — BASIC METABOLIC PANEL
Anion gap: 8 (ref 5–15)
BUN: 30 mg/dL — ABNORMAL HIGH (ref 8–23)
CO2: 30 mmol/L (ref 22–32)
Calcium: 9 mg/dL (ref 8.9–10.3)
Chloride: 92 mmol/L — ABNORMAL LOW (ref 98–111)
Creatinine, Ser: 1.21 mg/dL — ABNORMAL HIGH (ref 0.44–1.00)
GFR calc Af Amer: 46 mL/min — ABNORMAL LOW (ref >60)
GFR calc non Af Amer: 39 mL/min — ABNORMAL LOW (ref >60)
Glucose, Bld: 122 mg/dL — ABNORMAL HIGH (ref 70–99)
Potassium: 4.4 mmol/L (ref 3.5–5.1)
Sodium: 130 mmol/L — ABNORMAL LOW (ref 135–145)

## 2020-01-04 LAB — CBC
HCT: 37 % (ref 36.0–46.0)
Hemoglobin: 12.3 g/dL (ref 12.0–15.0)
MCH: 31.3 pg (ref 26.0–34.0)
MCHC: 33.2 g/dL (ref 30.0–36.0)
MCV: 94.1 fL (ref 80.0–100.0)
Platelets: 147 10*3/uL — ABNORMAL LOW (ref 150–400)
RBC: 3.93 MIL/uL (ref 3.87–5.11)
RDW: 13.9 % (ref 11.5–15.5)
WBC: 10 10*3/uL (ref 4.0–10.5)
nRBC: 0 % (ref 0.0–0.2)

## 2020-01-04 NOTE — ED Provider Notes (Addendum)
[facility] EMERGENCY DEPARTMENT Provider Note   CSN: [id number] Arrival date & time: 01/04/20  0941     History Chief Complaint  Patient presents with  . catheter problems    [name] is a 86 y.o. female.  Patient presenting today along with her daughter for suprapubic catheter not draining properly.  Patient's had a suprapubic catheter in place since August.  But recently had this 1 placed by interventional radiology on January 22.  The previous one on was not working will properly and urology wanted a more permanent catheter.  Prior to the placement of this she had a temporary urethral Foley catheter in place.  Her urologist is Dr. Alyson Ingles.  Patient daughter states that there has been overflow urine coming out of the urethra.  They just have not wanted her to urinate this way because she has had problems with infection and even got skin breakdown and needed fair amount of debridement.        Past Medical History:  Diagnosis Date  . Chest pain   . Depression   . GERD (gastroesophageal reflux disease)   . Hypertension   . Sleep apnea   . Thyroid disease   . Trigeminal neuralgia     Patient Active Problem List   Diagnosis Date Noted  . Urinary retention 12/04/2019  . Encounter for care or replacement of suprapubic tube ([place]) 12/04/2019  . Skin ulcer of sacrum ([place])   . Acute gout of left hand   . Acute blood loss anemia   . Acute on chronic diastolic (congestive) heart failure ([place])   . Sepsis ([place]) 07/11/2018  . Debility   . OSA (obstructive sleep apnea)   . Diastolic dysfunction   . AKI (acute kidney injury) ([place])   . Post-operative pain   . Tachypnea   . Steroid-induced hyperglycemia   . Leukocytosis   . Pressure injury of skin 06/27/2018  . Cellulitis and abscess of buttock   . Gastrointestinal hemorrhage   . Hypotension 06/26/2018  . ARF (acute renal failure) ([place]) 06/26/2018  . Black stool 06/26/2018  . Anemia 06/26/2018  . Bacteremia 03/07/2016   . Atypical chest pain 03/07/2016  . Acute-on-chronic kidney injury ([place]) 03/07/2016  . Essential hypertension 03/07/2016  . Chronic diastolic congestive heart failure ([place]) 03/07/2016  . Hypothyroidism 03/07/2016  . Depression with anxiety 03/07/2016  . UTI (lower urinary tract infection)   . Near syncope 08/20/2015  . Morbid obesity ([place]) 08/20/2015  . Pain in the chest 07/06/2015  . Awareness alteration, transient 05/07/2015  . History of stroke 05/07/2015    Past Surgical History:  Procedure Laterality Date  . APPENDECTOMY  1946  . CATARACT EXTRACTION     bilateral  . CYSTOSCOPY N/A 02/11/2019   Procedure: CYSTOSCOPY;  Surgeon: Cleon Gustin, MD;  Location: [facility];  Service: Urology;  Laterality: N/A;  . GALLBLADDER SURGERY    . INCISION AND DRAINAGE ABSCESS N/A 06/29/2018   Procedure: REPEAT INCISION AND DRAINAGE  BUTTOCK ABSCESS;  Surgeon: Erroll Luna, MD;  Location: [place];  Service: General;  Laterality: N/A;  . INCISION AND DRAINAGE PERIRECTAL ABSCESS Left 06/27/2018   Procedure: IRRIGATION AND DEBRIDEMENT BUTTOCK ABSCESS;  Surgeon: Ileana Roup, MD;  Location: [place];  Service: General;  Laterality: Left;  . INSERTION OF SUPRAPUBIC CATHETER N/A 07/03/2018   Procedure: INSERTION OF SUPRAPUBIC CATHETER;  Surgeon: Lucas Mallow, MD;  Location: [place];  Service: Urology;  Laterality: N/A;  . INSERTION OF SUPRAPUBIC  CATHETER N/A 02/11/2019   Procedure: INSERTION OF SUPRAPUBIC CATHETER;  Surgeon: Malen Gauze, MD;  Location: [facility];  Service: Urology;  Laterality: N/A;  30 MINS  . KNEE ARTHROPLASTY  2006   left   . KNEE ARTHROPLASTY  2006   right   . LAPAROSCOPIC DIVERTED COLOSTOMY N/A 07/03/2018   Procedure: LAPAROSCOPIC DIVERTED COLOSTOMY;  Surgeon: Axel Filler, MD;  Location: [facility] OR;  Service: General;  Laterality: N/A;  . SHOULDER ARTHROSCOPY  2009   right  . TOOTH EXTRACTION    . TOTAL HIP ARTHROPLASTY  2002    left     OB History   No  obstetric history on file.     Family History  Problem Relation Age of Onset  . Diabetes Mother   . Stroke Mother   . Cancer Brother   . Kidney disease Sister     Social History   Tobacco Use  . Smoking status: Former Smoker    Types: Cigarettes  . Smokeless tobacco: Never Used  Substance Use Topics  . Alcohol use: No    Alcohol/week: 0.0 standard drinks  . Drug use: No    Home Medications Prior to Admission medications   Medication Sig Start Date End Date Taking? Authorizing Provider  acetaminophen (TYLENOL) 325 MG tablet Take 2 tablets (650 mg total) by mouth every 6 (six) hours as needed for mild pain (or Fever >/= 101). Patient not taking: Reported on 12/04/2019 07/19/18   Charlton Amor, PA-C  albuterol (PROVENTIL HFA;VENTOLIN HFA) 108 [phone] Base) MCG/ACT inhaler Inhale 2 puffs into the lungs every 6 (six) hours as needed for wheezing or shortness of breath. 07/19/18   Angiulli, Mcarthur Rossetti, PA-C  allopurinol (ZYLOPRIM) 100 MG tablet Take 1 tablet (100 mg total) by mouth daily. Patient taking differently: Take 100 mg by mouth 2 (two) times daily.  07/19/18   Angiulli, Mcarthur Rossetti, PA-C  ALPRAZolam Prudy Feeler) 0.5 MG tablet Take 0.5 mg by mouth at bedtime. 01/21/19   [provider]  aspirin EC 81 MG tablet Take 81 mg by mouth daily.    [provider]  calcium citrate-vitamin D (CITRACAL+D) 315-200 MG-UNIT tablet Take 1 tablet by mouth daily. Patient not taking: Reported on 12/04/2019 07/19/18   Angiulli, Mcarthur Rossetti, PA-C  Carbamazepine (EQUETRO) 300 MG CP12 Take 1 capsule (300 mg total) by mouth 2 (two) times daily. 01/02/20   Penumalli, Glenford Bayley, MD  cephALEXin (KEFLEX) 250 MG capsule Take 1 capsule (250 mg total) by mouth 4 (four) times daily. Patient not taking: Reported on 12/04/2019 10/25/19   Mancel Bale, MD  EUTHYROX 150 MCG tablet Take 1 tablet by mouth daily. 12/31/19   [provider]  furosemide (LASIX) 40 MG tablet Take 1 tablet (40 mg total) by mouth  daily. Patient taking differently: Take 40 mg by mouth 2 (two) times daily.  07/19/18   Angiulli, Mcarthur Rossetti, PA-C  guaiFENesin (MUCINEX) 600 MG 12 hr tablet Take 1 tablet (600 mg total) by mouth 2 (two) times daily as needed for cough or to loosen phlegm. 03/09/16   Alison Murray, MD  meclizine (ANTIVERT) 12.5 MG tablet Take 12.5 mg by mouth 3 (three) times daily as needed for dizziness.    [provider]  Multiple Vitamin (MULTIVITAMIN WITH MINERALS) TABS tablet Take 1 tablet by mouth daily.    [provider]  nitroGLYCERIN (NITROSTAT) 0.4 MG SL tablet  03/07/19   [provider]  omeprazole (PRILOSEC) 40 MG capsule Take  1 capsule (40 mg total) by mouth daily. Patient taking differently: Take 40 mg by mouth every other day.  07/19/18   Angiulli, Mcarthur Rossetti, PA-C  oxybutynin (DITROPAN) 5 MG tablet Take 0.5 tablets (2.5 mg total) by mouth every 8 (eight) hours as needed for bladder spasms. 10/25/19   Mancel Bale, MD  sertraline (ZOLOFT) 50 MG tablet Take 1 tablet (50 mg total) by mouth daily. 07/19/18   Angiulli, Mcarthur Rossetti, PA-C  traZODone (DESYREL) 50 MG tablet Take 50 mg by mouth at bedtime. 01/21/19   [provider]    Allergies    Morphine and related, Tramadol, Ciprofloxacin, Codeine, Gabapentin, Lipitor [atorvastatin], Lyrica [pregabalin], Motrin [ibuprofen], Sulfa antibiotics, and Tylenol [acetaminophen]  Review of Systems   Review of Systems  Unable to perform ROS: Mental status change  Constitutional: Negative for chills and fever.  HENT: Negative for congestion, rhinorrhea and sore throat.   Eyes: Negative for visual disturbance.  Respiratory: Negative for cough and shortness of breath.   Cardiovascular: Negative for chest pain and leg swelling.  Gastrointestinal: Negative for abdominal pain, diarrhea, nausea and vomiting.  Genitourinary: Positive for difficulty urinating. Negative for dysuria and hematuria.  Musculoskeletal: Negative for back pain  and neck pain.  Skin: Negative for rash.  Neurological: Negative for dizziness, light-headedness and headaches.  Hematological: Does not bruise/bleed easily.  Psychiatric/Behavioral: Negative for confusion.    Physical Exam Updated Vital Signs BP 138/69   Pulse 69   Temp 97.9 F (36.6 C) (Oral)   Resp 14   Ht 1.6 m (5\' 3" )   Wt 99 kg   SpO2 99%   BMI 38.67 kg/m   Physical Exam Vitals and nursing note reviewed.  Constitutional:      General: She is not in acute distress.    Appearance: Normal appearance. She is well-developed.  HENT:     Head: Normocephalic and atraumatic.  Eyes:     Conjunctiva/sclera: Conjunctivae normal.  Cardiovascular:     Rate and Rhythm: Normal rate and regular rhythm.     Heart sounds: No murmur.  Pulmonary:     Effort: Pulmonary effort is normal. No respiratory distress.     Breath sounds: Normal breath sounds.  Abdominal:     Palpations: Abdomen is soft.     Tenderness: There is no abdominal tenderness.     Comments: Colostomy bag left upper quadrant of the abdomen functioning well.  Suprapubic catheter sutured in place no signs of infection but no urine drainage seems to be a lot of thick stuff in the tube.  Some urine is leaking around the urethra vaginal area.  Genitourinary:    Comments: Overflow urine coming out of the urethra. Musculoskeletal:     Cervical back: Neck supple.  Skin:    General: Skin is warm and dry.  Neurological:     Mental Status: She is alert. Mental status is at baseline.     ED Results / Procedures / Treatments   Labs (all labs ordered are listed, but only abnormal results are displayed) Labs Reviewed  URINALYSIS, ROUTINE W REFLEX MICROSCOPIC - Abnormal; Notable for the following components:      Result Value   Color, Urine STRAW (*)    APPearance HAZY (*)    Specific Gravity, Urine 1.004 (*)    Hgb urine dipstick SMALL (*)    Leukocytes,Ua MODERATE (*)    All other components within normal limits   BASIC METABOLIC PANEL - Abnormal; Notable for the following components:  Sodium 130 (*)    Chloride 92 (*)    Glucose, Bld 122 (*)    BUN 30 (*)    Creatinine, Ser 1.21 (*)    GFR calc non Af Amer 39 (*)    GFR calc Af Amer 46 (*)    All other components within normal limits  CBC - Abnormal; Notable for the following components:   Platelets 147 (*)    All other components within normal limits  URINE CULTURE    EKG None  Radiology No results found.  Procedures Procedures (including critical care time)  Medications Ordered in ED Medications - No data to display  ED Course  I have reviewed the triage vital signs and the nursing notes.  Pertinent labs & imaging results that were available during my care of the patient were reviewed by me and considered in my medical decision making (see chart for details).    MDM Rules/Calculators/A&P                      We were unable to get the suprapubic catheter to function properly.  There is no good way to irrigate it.  Placed Foley catheter and strangely enough after Foley catheter was placed actually started to get good urine output of both catheters.  May be a dislodged something on the tip of the suprapubic catheter.  We will keep him both in place.  Patient's labs without significant abnormality urinalysis not very remarkable for somebody with a suprapubic catheter.  But sent for culture.  If it continues to flow well patient's daughter will contact Dr. Ronne Binning from urology.  If not I contacted Dr. Ruthy Dick from interventional radiology and they will be contacting her the first part of the week to set up for replacement of the catheter suprapubic catheter if necessary.    Final Clinical Impression(s) / ED Diagnoses Final diagnoses:  Blocked suprapubic catheter, initial encounter [facility])    Rx / DC Orders ED Discharge Orders    None       Vanetta Mulders, MD 01/04/20 1402    Vanetta Mulders, MD 01/04/20 [phone]

## 2020-01-04 NOTE — ED Notes (Signed)
Pt voided large amount of urine from urethra

## 2020-01-04 NOTE — ED Triage Notes (Signed)
Pt's daughter states that she had her superpubic catheter changed on the Jan 22 this morning it is not working.

## 2020-01-04 NOTE — Discharge Instructions (Addendum)
Expect a call from interventional radiology spoke with Dr. Ruthy Dick today.  Interesting that now that we placed the Foley catheter in the urethra were now getting good flow of urine out of the suprapubic.  It may have allowed some unclogging at the tip of the suprapubic catheter tube.  If it continues to flow well.  Could probably follow-up with Dr. Ronne Binning from urology to have the Foley catheter removed.  If not have interventional radiology evaluate the suprapubic catheter.  Today's labs without any significant abnormalities urine sent for culture.

## 2020-01-04 NOTE — ED Notes (Signed)
Once foley cath inserted. Suprapubic cath drained 600 ml of cloudy urine

## 2020-01-07 LAB — URINE CULTURE: Culture: 40000 — AB

## 2020-01-08 ENCOUNTER — Telehealth: Payer: Self-pay | Admitting: *Deleted

## 2020-01-08 NOTE — Telephone Encounter (Signed)
Post ED Visit - Positive Culture Follow-up  Culture report reviewed by antimicrobial stewardship pharmacist: [facility] []  , Pharm.D. []  Enzo Bi, Pharm.D., BCPS AQ-ID []  , Pharm.D., BCPS []  Celedonio Miyamoto, Pharm.D., BCPS []  [place], Garvin Fila.D., BCPS, AAHIVP []  , Pharm.D., BCPS, AAHIVP [x]  Georgina Pillion, PharmD, BCPS []  , PharmD, BCPS []  [place], PharmD, BCPS []  [address], PharmD []  , PharmD, BCPS []  Estella Husk, PharmD  Pharmacy Team []  Lysle Pearl, PharmD []  , PharmD []  Phillips Climes, PharmD []  , Rph []  Agapito Games) , PharmD []  Verlan Friends, PharmD []  , PharmD []  Mervyn Gay, PharmD []  , PharmD []  Vinnie Level, PharmD []  [facility], PharmD []  , PharmD []  Len Childs, PharmD   Positive urine culture Treated with Augmentin, organism sensitive to the same and no further patient follow-up is required at this time.  [facility] 01/08/2020, 9:32 AM

## 2020-01-09 ENCOUNTER — Other Ambulatory Visit (HOSPITAL_COMMUNITY): Payer: Self-pay | Admitting: Interventional Radiology

## 2020-01-09 ENCOUNTER — Other Ambulatory Visit: Payer: Self-pay

## 2020-01-09 ENCOUNTER — Ambulatory Visit (HOSPITAL_COMMUNITY)
Admission: RE | Admit: 2020-01-09 | Discharge: 2020-01-09 | Disposition: A | Discharge status: Home / Self Care | Payer: Medicare Other | Source: Ambulatory Visit | Attending: Interventional Radiology | Admitting: Interventional Radiology

## 2020-01-09 DIAGNOSIS — Z466 Encounter for fitting and adjustment of urinary device: Secondary | ICD-10-CM | POA: Insufficient documentation

## 2020-01-09 DIAGNOSIS — R339 Retention of urine, unspecified: Secondary | ICD-10-CM | POA: Diagnosis not present

## 2020-01-09 DIAGNOSIS — Z435 Encounter for attention to cystostomy: Secondary | ICD-10-CM

## 2020-01-09 HISTORY — PX: IR CATHETER TUBE CHANGE: IMG717

## 2020-01-09 MED ORDER — LIDOCAINE HCL 1 % IJ SOLN
INTRAMUSCULAR | Status: DC | PRN
  Administered 2020-01-09: 5 mL

## 2020-01-09 MED ORDER — LIDOCAINE HCL 1 % IJ SOLN
INTRAMUSCULAR | Status: AC
  Filled 2020-01-09: qty 20

## 2020-01-09 MED ORDER — LIDOCAINE VISCOUS HCL 2 % MT SOLN
OROMUCOSAL | Status: AC
  Filled 2020-01-09: qty 15

## 2020-01-09 MED ORDER — IOHEXOL 300 MG/ML  SOLN
50.0000 mL | Freq: Once | INTRAMUSCULAR | Status: AC | PRN
  Administered 2020-01-09: 12:00:00 7 mL

## 2020-01-09 NOTE — Procedures (Signed)
Interventional Radiology Procedure Note  Procedure: Exchange and upsize to a new 92F g-tube style suprapubic bladder catheter.   Complications: None  Estimated Blood Loss: None  Recommendations: - Return in 4 weeks for final exchange to an 92F Foley catheter - Pt's f/u with Urology is on 03/04/20.  Future exchanges to be carried out in their office.   Signed,  Sterling Big, MD

## 2020-01-10 ENCOUNTER — Other Ambulatory Visit (HOSPITAL_COMMUNITY): Payer: Medicare Other

## 2020-01-27 ENCOUNTER — Other Ambulatory Visit (HOSPITAL_COMMUNITY): Payer: Medicare Other

## 2020-01-28 ENCOUNTER — Telehealth: Payer: Self-pay | Admitting: Urology

## 2020-01-28 NOTE — Telephone Encounter (Signed)
Taken care of

## 2020-02-05 ENCOUNTER — Encounter: Payer: Self-pay | Admitting: Urology

## 2020-02-05 ENCOUNTER — Ambulatory Visit (INDEPENDENT_AMBULATORY_CARE_PROVIDER_SITE_OTHER): Payer: Medicare Other | Admitting: Urology

## 2020-02-05 ENCOUNTER — Other Ambulatory Visit: Payer: Self-pay

## 2020-02-05 VITALS — BP 123/71 | HR 97 | Temp 97.4°F | Ht 63.0 in | Wt 218.0 lb

## 2020-02-05 DIAGNOSIS — R339 Retention of urine, unspecified: Secondary | ICD-10-CM | POA: Diagnosis not present

## 2020-02-05 NOTE — Progress Notes (Signed)
02/05/2020 10:19 AM   [name] 01-Feb-1928 [id number]  Referring provider: Kirby Funk, MD [address] [place],  [place] [address]  urinary retention  HPI: [name] is a 86yo with a  Hx of urinary retention managed with an SP tube here for SP tube change. She initially had it placed with IR and currentyl the patient has a 46french Sp tube. No pelvic pain. Urine clear. No hx of UTI.   PMH: Past Medical History:  Diagnosis Date  . Chest pain   . Depression   . GERD (gastroesophageal reflux disease)   . Hypertension   . Sleep apnea   . Thyroid disease   . Trigeminal neuralgia     Surgical History: Past Surgical History:  Procedure Laterality Date  . APPENDECTOMY  1946  . CATARACT EXTRACTION     bilateral  . CYSTOSCOPY N/A 02/11/2019   Procedure: CYSTOSCOPY;  Surgeon: Malen Gauze, MD;  Location: [facility];  Service: Urology;  Laterality: N/A;  . GALLBLADDER SURGERY    . INCISION AND DRAINAGE ABSCESS N/A 06/29/2018   Procedure: REPEAT INCISION AND DRAINAGE  BUTTOCK ABSCESS;  Surgeon: Harriette Bouillon, MD;  Location: [facility];  Service: General;  Laterality: N/A;  . INCISION AND DRAINAGE PERIRECTAL ABSCESS Left 06/27/2018   Procedure: IRRIGATION AND DEBRIDEMENT BUTTOCK ABSCESS;  Surgeon: Andria Meuse, MD;  Location: [facility];  Service: General;  Laterality: Left;  . INSERTION OF SUPRAPUBIC CATHETER N/A 07/03/2018   Procedure: INSERTION OF SUPRAPUBIC CATHETER;  Surgeon: Crista Elliot, MD;  [facility];  Service: Urology;  Laterality: N/A;  . INSERTION OF SUPRAPUBIC CATHETER N/A 02/11/2019   Procedure: INSERTION OF SUPRAPUBIC CATHETER;  Surgeon: Malen Gauze, MD;  Location: [facility];  Service: Urology;  Laterality: N/A;  30 MINS  . IR CATHETER TUBE CHANGE  01/09/2020  . KNEE ARTHROPLASTY  2006   left   . KNEE ARTHROPLASTY  2006   right   . LAPAROSCOPIC DIVERTED COLOSTOMY N/A 07/03/2018   Procedure: LAPAROSCOPIC DIVERTED COLOSTOMY;  Surgeon:  Axel Filler, MD;  Location: [facility] OR;  Service: General;  Laterality: N/A;  . SHOULDER ARTHROSCOPY  2009   right  . TOOTH EXTRACTION    . TOTAL HIP ARTHROPLASTY  2002    left    Home Medications:  Allergies as of 02/05/2020      Reactions   Morphine And Related Hives, Itching   Tramadol Other (See Comments)   vertigo   Ciprofloxacin Itching, Rash   Codeine Anxiety   Very agitated then unable to arouse   Gabapentin Other (See Comments)   Alters mood   Lipitor [atorvastatin] Itching, Rash   Lyrica [pregabalin] Other (See Comments)   Lethargic, mood changes   Motrin [ibuprofen] Anxiety   Agitated    Sulfa Antibiotics Rash   Tylenol [acetaminophen]       Medication List       Accurate as of February 05, 2020 10:19 AM. If you have any questions, ask your nurse or doctor.        STOP taking these medications   acetaminophen 325 MG tablet Commonly known as: TYLENOL Stopped by: [name], MD   calcium citrate-vitamin D 315-200 MG-UNIT tablet Commonly known as: CITRACAL+D Stopped by: [name], MD   Carbamazepine 300 MG Cp12 Commonly known as: Equetro Stopped by: [name], MD   cephALEXin 250 MG capsule Commonly known as: KEFLEX Stopped by: [name], MD   multivitamin with minerals  Tabs tablet Stopped by: [name], MD   oxybutynin 5 MG tablet Commonly known as: DITROPAN Stopped by: [name], MD     TAKE these medications   albuterol 108 (90 Base) MCG/ACT inhaler Commonly known as: VENTOLIN HFA Inhale 2 puffs into the lungs every 6 (six) hours as needed for wheezing or shortness of breath.   allopurinol 100 MG tablet Commonly known as: ZYLOPRIM Take 1 tablet (100 mg total) by mouth daily. What changed: when to take this   ALPRAZolam 0.5 MG tablet Commonly known as: XANAX Take 0.5 mg by mouth at bedtime.   aspirin EC 81 MG tablet Take 81 mg by mouth daily.   BENEFIBER DRINK MIX PO Take by mouth.     carbamazepine 200 MG tablet Commonly known as: TEGRETOL Take 200 mg by mouth 2 (two) times daily.   carbamazepine 300 MG 12 hr capsule Commonly known as: CARBATROL Take 300 mg by mouth 2 (two) times daily.   furosemide 40 MG tablet Commonly known as: LASIX Take 1 tablet (40 mg total) by mouth daily. What changed: when to take this   guaiFENesin 600 MG 12 hr tablet Commonly known as: MUCINEX Take by mouth 2 (two) times daily. What changed: Another medication with the same name was removed. Continue taking this medication, and follow the directions you see here. Changed by: [name], MD   levothyroxine 137 MCG tablet Commonly known as: SYNTHROID Take 137 mcg by mouth daily. What changed: Another medication with the same name was removed. Continue taking this medication, and follow the directions you see here. Changed by: [name], MD   meclizine 12.5 MG tablet Commonly known as: ANTIVERT Take 12.5 mg by mouth 3 (three) times daily as needed for dizziness.   multivitamin tablet Take 1 tablet by mouth daily.   nitroGLYCERIN 0.4 MG SL tablet Commonly known as: NITROSTAT   omeprazole 40 MG capsule Commonly known as: PRILOSEC Take 1 capsule (40 mg total) by mouth daily. What changed: when to take this   sertraline 50 MG tablet Commonly known as: ZOLOFT Take 1 tablet (50 mg total) by mouth daily.   traZODone 50 MG tablet Commonly known as: DESYREL Take 50 mg by mouth at bedtime.       Allergies:  Allergies  Allergen Reactions  . Morphine And Related Hives and Itching  . Tramadol Other (See Comments)    vertigo  . Ciprofloxacin Itching and Rash  . Codeine Anxiety    Very agitated then unable to arouse  . Gabapentin Other (See Comments)    Alters mood  . Lipitor [Atorvastatin] Itching and Rash  . Lyrica [Pregabalin] Other (See Comments)    Lethargic, mood changes  . Motrin [Ibuprofen] Anxiety    Agitated   . Sulfa Antibiotics Rash  . Tylenol  [Acetaminophen]     Family History: Family History  Problem Relation Age of Onset  . Diabetes Mother   . Stroke Mother   . Cancer Brother   . Kidney disease Sister     Social History:  reports that she has quit smoking. Her smoking use included cigarettes. She has never used smokeless tobacco. She reports that she does not drink alcohol or use drugs.  ROS: All other review of systems were reviewed and are negative except what is noted above in HPI  Physical Exam: BP 123/71   Pulse 97   Temp (!) 97.4 F (36.3 C)   Ht 5\' 3"  (1.6 m)   Wt 218 lb (  98.9 kg)   BMI 38.62 kg/m   Constitutional:  Alert and oriented, No acute distress. HEENT: [place] AT, moist mucus membranes.  Trachea midline, no masses. Cardiovascular: No clubbing, cyanosis, or edema. Respiratory: Normal respiratory effort, no increased work of breathing. GI: Abdomen is soft, nontender, nondistended, no abdominal masses GU: No CVA tenderness Lymph: No cervical or inguinal lymphadenopathy. Skin: No rashes, bruises or suspicious lesions. Neurologic: Grossly intact, no focal deficits, moving all 4 extremities. Psychiatric: Normal mood and affect.  Laboratory Data: Lab Results  Component Value Date   WBC 10.0 01/04/2020   HGB 12.3 01/04/2020   HCT 37.0 01/04/2020   MCV 94.1 01/04/2020   PLT 147 (L) 01/04/2020    Lab Results  Component Value Date   CREATININE 1.21 (H) 01/04/2020    No results found for: PSA  No results found for: TESTOSTERONE  No results found for: HGBA1C  Urinalysis    Component Value Date/Time   COLORURINE STRAW (A) 01/04/2020 1224   APPEARANCEUR HAZY (A) 01/04/2020 1224   LABSPEC 1.004 (L) 01/04/2020 1224   PHURINE 7.0 01/04/2020 1224   GLUCOSEU NEGATIVE 01/04/2020 1224   HGBUR SMALL (A) 01/04/2020 1224   BILIRUBINUR NEGATIVE 01/04/2020 1224   KETONESUR NEGATIVE 01/04/2020 1224   PROTEINUR NEGATIVE 01/04/2020 1224   NITRITE NEGATIVE 01/04/2020 1224   LEUKOCYTESUR MODERATE (A)  01/04/2020 1224    Lab Results  Component Value Date   BACTERIA NONE SEEN 01/04/2020    Pertinent Imaging:  No results found for this or any previous visit. No results found for this or any previous visit. No results found for this or any previous visit. No results found for this or any previous visit. No results found for this or any previous visit. No results found for this or any previous visit. No results found for this or any previous visit. No results found for this or any previous visit.  Assessment & Plan:    1. Urinary retention -SP tube changed today with 16 french foley. We will continuie monthly SP tube changes and she will see me for followup in 6 months   Return in about 6 months (around 08/07/2020), or 86month with MD, for one month NV cath change.  Nicolette Bang, MD  [facility] [place]

## 2020-02-05 NOTE — Patient Instructions (Signed)
Suprapubic Catheter Home Guide °A suprapubic catheter is a flexible tube that is used to drain urine from the bladder into a collection bag outside the body. The catheter is inserted into the bladder through a small opening in the lower abdomen, above the pubic bone (suprapubic area) and a few inches below your belly button (navel). A tiny balloon filled with germ-free (sterile) water helps to keep the catheter in place. °The collection bag must be emptied at least once a day and cleaned at least every other day. The collection bag can be put beside your bed at night and attached to your leg during the day. You may have a large collection bag to use at night and a smaller one to use during the day. °Your suprapubic catheter may need to be changed every 4-6 weeks, or as often as recommended by your health care provider. Healing of the tract where the catheter is placed can take 6 weeks to 6 months. During that time, your health care provider may change your catheter. Once the tract is well healed, you or a caregiver will change your suprapubic catheter at home. °What are the risks? °This catheter is safe to use. However, problems can occur, including: °· Blocked urine flow. This can occur if the catheter stops working, or if you have a blood clot in your bladder or in the catheter. °· Irritation of the skin around the catheter. °· Infection. This can happen if bacteria gets into your bladder. °Supplies needed: °· Two pairs of sterile gloves. °· Paper towels. °· Catheter. °· Two syringes. °· Sterile water. °· Sterile cleaning solution. °· Lubricant. °· Collection bags. °How to change the catheter ° °1. Drink plenty of fluids during the hours before you change the catheter. °2. Wash your hands with soap and water. If soap and water are not available, use hand sanitizer. °3. Draw up sterile water into a syringe to have ready to fill the new catheter balloon. The amount will depend on the size of the balloon. °4. Have  all of your supplies ready and close to you on a paper towel. °5. Lie on your back, sitting slightly upright so that you can see the catheter and opening. °6. Put on sterile gloves. °7. Clean the skin around the catheter opening using the sterile cleaning solution. °8. Remove the water from the balloon in the catheter using a syringe. °9. Slowly remove the catheter. If the catheter seems stuck, or if you have difficulty removing it: °? Do not pull on it. °? Call your health care provider right away. °10. Place the old catheter on a paper towel to discard later. °11. Take off the used gloves, and put on a new pair. °12. Put lubricant on the end of the new catheter that will go into your bladder. °13. Clean the skin around the catheter opening using the sterile cleaning solution. °14. Gently slide the catheter through the opening in your abdomen and into the tract that leads to your bladder. °15. Wait for some urine to start flowing through the catheter. °16. When urine starts to flow through the catheter, attach the collection bag to the end of the catheter. Make sure the connection is tight. °17. Use a syringe to fill the catheter balloon with sterile water. Fill to the amount directed by your health care provider. °18. Remove the gloves and wash your hands with soap and water. °How to care for the skin around the catheter °Follow your health care provider's instructions on   caring for your skin. °· Use a clean washcloth and soapy water to clean the skin around your catheter every day. Pat the area dry with a clean paper towel. °· Do not pull on the catheter. °· Do not use ointment or lotion on this area, unless told by your health care provider. °· Check the skin around the catheter every day for signs of infection. Check for: °? Redness, swelling, or pain. °? Fluid or blood. °? Warmth. °? Pus or a bad smell. °How to empty and clean the collection bag °Empty the large collection bag every 8 hours. Empty the small  collection bag when it is about ? full. Clean the collection bag every 2-3 days, or as often as told by your health care provider. To do this: °1. Wash your hands with soap and water. If soap and water are not available, use hand sanitizer. °2. Disconnect the bag from the catheter and immediately attach a new bag to the catheter. °3. Hold the used bag over the toilet or another container. °4. Turn the valve (spigot) at the bottom of the bag to empty the urine. Empty the used bag completely. °? Do not touch the opening of the spigot. °? Do not let the opening touch the toilet or container. °5. Close the spigot tightly when the bag is empty. °6. Clean the used bag in one of the following methods: °? According to the manufacturer's instructions. °? As told by your health care provider. °7. Let the bag dry completely. Put it in a clean plastic bag before storing it. °General tips ° °· Always wash your hands before and after caring for your catheter and collection bag. Use a mild, fragrance-free soap. If soap and water are not available, use hand sanitizer. °· Clean the outside of the catheter with soap and water as often as told by your health care provider. °· Always make sure there are no twists or kinks in the catheter tube. °· Always make sure there are no leaks in the catheter or collection bag. °· Always wear the leg bag below your knee. °· Make sure the overnight drainage bag is always lower than the level of your bladder, but do not let it touch the floor. Before you go to sleep, hang the bag inside a wastebasket that is covered by a clean plastic bag. °· Drink enough fluid to keep your urine pale yellow. °· Do not take baths, swim, or use a hot tub until your health care provider approves. Ask your health care provider if you may take showers. °Contact a heath care provider if: °· You leak urine. °· You have redness, swelling, or pain around your catheter. °· You have fluid or blood coming from your catheter  opening. °· Your catheter opening feels warm to the touch. °· You have pus or a bad smell coming from your catheter opening. °· You have a fever or chills. °· Your urine flow slows down. °· Your urine becomes cloudy or smelly. °Get help right away if: °· Your catheter comes out. °· You have: °? Nausea. °? Back pain. °? Difficulty changing your catheter. °? Blood in your urine. °? No urine flow for 1 hour. °Summary °· A suprapubic catheter is a flexible tube that is used to drain urine from the bladder into a collection bag outside the body. °· Your suprapubic catheter may need to be changed every 4-6 weeks, or as recommended by your health care provider. °· Follow instructions on how to   change the catheter and how to empty and clean the collection bag. °· Always wash your hands before and after caring for your catheter and collection bag. Drink enough fluid to keep your urine pale yellow. °· Get help right away if you have difficulty changing your catheter or if there is blood in your urine. °This information is not intended to replace advice given to you by your health care provider. Make sure you discuss any questions you have with your health care provider. °Document Revised: 03/07/2019 Document Reviewed: 12/19/2018 °Elsevier Patient Education © 2020 [employer] ° °

## 2020-02-05 NOTE — Progress Notes (Signed)

## 2020-02-06 ENCOUNTER — Other Ambulatory Visit (HOSPITAL_COMMUNITY): Payer: Medicare Other

## 2020-02-26 ENCOUNTER — Encounter: Payer: Self-pay | Admitting: Diagnostic Neuroimaging

## 2020-02-26 ENCOUNTER — Ambulatory Visit: Payer: Medicare Other | Admitting: Diagnostic Neuroimaging

## 2020-02-26 ENCOUNTER — Ambulatory Visit: Payer: Medicare Other | Admitting: Urology

## 2020-02-26 ENCOUNTER — Other Ambulatory Visit: Payer: Self-pay

## 2020-02-26 VITALS — BP 135/66 | HR 64 | Temp 97.0°F | Wt 218.0 lb

## 2020-02-26 DIAGNOSIS — G500 Trigeminal neuralgia: Secondary | ICD-10-CM | POA: Diagnosis not present

## 2020-02-26 MED ORDER — CARBAMAZEPINE ER 300 MG PO CP12: 300.0000 mg | ORAL_CAPSULE | Freq: Two times a day (BID) | ORAL | 4 refills | Status: AC

## 2020-02-26 NOTE — Progress Notes (Signed)
[facility]  PATIENT: [name] DOB: Sep 25, 1928  REFERRING CLINICIAN: S Best HISTORY FROM: patient and daughter  REASON FOR VISIT: follow up    HISTORICAL  CHIEF COMPLAINT:  Chief Complaint  Patient presents with  . Trigeminal neuralgia    rm 6, 6 month FU  dgtr- [facility] Asp "my face hasn't bothered me as bad in last 3 weeks; tooth brushing aggravates it"    HISTORY OF PRESENT ILLNESS:   UPDATE (02/26/20, [facility]): Since last visit, doing well. Pain much improved. Only a slight twinge of pain with brushing teeth a few weeks ago. Misses having her dentures (not fitting well anymore). Tolerating CBZ 300mg  twice a day.  UPDATE (08/28/19, [facility]): Since last visit, doing slightly better with CBZ, but still daily pain, and intermittent flare ups. Chewing, talking, drinking touch still aggravate. No side effects from CBZ.   PRIOR HPI (05/28/19): 86 year old female here for evaluation of right jaw / mouth pain.  Summer 2019 patient had pain in her right lower alveolar ridge, near her partial denture.  Patient thought she had pain in her tooth.  She went to her dentist who did a #29 extraction in the lower right mandible on 10/17/2018.  Unfortunately pain did not improve and in fact worsened.  Now patient having intermittent lightening shocklike pain like a nail driving through her gum.  This happens randomly and without provocation.  Walking and chewing exacerbates.  Patient also has daily baseline pain.  Patient tried Tylenol with codeine but could not take this because of her side effect profile.  She also reports side effects to tramadol, gabapentin, Lyrica.  She has allergic reaction with hives with morphine.  Tramadol causes vertigo.   REVIEW OF SYSTEMS: Full 14 system review of systems performed and negative with exception of: as per HPI.   ALLERGIES: Allergies  Allergen Reactions  . Morphine And Related Hives and Itching  . Tramadol Other (See Comments)    vertigo  .  Ciprofloxacin Itching and Rash  . Codeine Anxiety    Very agitated then unable to arouse  . Gabapentin Other (See Comments)    Alters mood  . Lipitor [Atorvastatin] Itching and Rash  . Lyrica [Pregabalin] Other (See Comments)    Lethargic, mood changes  . Motrin [Ibuprofen] Anxiety    Agitated   . Sulfa Antibiotics Rash  . Tylenol [Acetaminophen]     HOME MEDICATIONS: Outpatient Medications Prior to Visit  Medication Sig Dispense Refill  . albuterol (PROVENTIL HFA;VENTOLIN HFA) 108 (90 Base) MCG/ACT inhaler Inhale 2 puffs into the lungs every 6 (six) hours as needed for wheezing or shortness of breath. 1 Inhaler 1  . allopurinol (ZYLOPRIM) 100 MG tablet Take 1 tablet (100 mg total) by mouth daily. (Patient taking differently: Take 100 mg by mouth 2 (two) times daily. ) 30 tablet 0  . ALPRAZolam (XANAX) 0.5 MG tablet Take 0.5 mg by mouth at bedtime.    10/19/2018 aspirin EC 81 MG tablet Take 81 mg by mouth daily.    . carbamazepine (CARBATROL) 300 MG 12 hr capsule Take 300 mg by mouth 2 (two) times daily.    . furosemide (LASIX) 40 MG tablet Take 1 tablet (40 mg total) by mouth daily. (Patient taking differently: Take 40 mg by mouth 2 (two) times daily. ) 30 tablet 0  . guaiFENesin (MUCINEX) 600 MG 12 hr tablet Take by mouth 2 (two) times daily.    Marland Kitchen levothyroxine (SYNTHROID) 137 MCG tablet Take 137 mcg by mouth  daily.    . meclizine (ANTIVERT) 12.5 MG tablet Take 12.5 mg by mouth 3 (three) times daily as needed for dizziness.    . Multiple Vitamin (MULTIVITAMIN) tablet Take 1 tablet by mouth daily.    . nitroGLYCERIN (NITROSTAT) 0.4 MG SL tablet     . omeprazole (PRILOSEC) 40 MG capsule Take 1 capsule (40 mg total) by mouth daily. (Patient taking differently: Take 40 mg by mouth every other day. ) 30 capsule 0  . sertraline (ZOLOFT) 50 MG tablet Take 1 tablet (50 mg total) by mouth daily. 30 tablet 0  . traZODone (DESYREL) 50 MG tablet Take 50 mg by mouth at bedtime.    . Wheat Dextrin  (BENEFIBER DRINK MIX PO) Take by mouth.    . carbamazepine (TEGRETOL) 200 MG tablet Take 200 mg by mouth 2 (two) times daily.     No facility-administered medications prior to visit.    PAST MEDICAL HISTORY: Past Medical History:  Diagnosis Date  . Chest pain   . Depression   . GERD (gastroesophageal reflux disease)   . Hypertension   . Sleep apnea   . Thyroid disease   . Trigeminal neuralgia     PAST SURGICAL HISTORY: Past Surgical History:  Procedure Laterality Date  . APPENDECTOMY  1946  . CATARACT EXTRACTION     bilateral  . CYSTOSCOPY N/A 02/11/2019   Procedure: CYSTOSCOPY;  Surgeon: Cleon Gustin, MD;  Location: [facility];  Service: Urology;  Laterality: N/A;  . GALLBLADDER SURGERY    . INCISION AND DRAINAGE ABSCESS N/A 06/29/2018   Procedure: REPEAT INCISION AND DRAINAGE  BUTTOCK ABSCESS;  Surgeon: Erroll Luna, MD;  Location: [place];  Service: General;  Laterality: N/A;  . INCISION AND DRAINAGE PERIRECTAL ABSCESS Left 06/27/2018   Procedure: IRRIGATION AND DEBRIDEMENT BUTTOCK ABSCESS;  Surgeon: Ileana Roup, MD;  Location: [place];  Service: General;  Laterality: Left;  . INSERTION OF SUPRAPUBIC CATHETER N/A 07/03/2018   Procedure: INSERTION OF SUPRAPUBIC CATHETER;  Surgeon: Lucas Mallow, MD;  Location: [place];  Service: Urology;  Laterality: N/A;  . INSERTION OF SUPRAPUBIC CATHETER N/A 02/11/2019   Procedure: INSERTION OF SUPRAPUBIC CATHETER;  Surgeon: Cleon Gustin, MD;  Location: [facility];  Service: Urology;  Laterality: N/A;  30 MINS  . IR CATHETER TUBE CHANGE  01/09/2020  . KNEE ARTHROPLASTY  2006   left   . KNEE ARTHROPLASTY  2006   right   . LAPAROSCOPIC DIVERTED COLOSTOMY N/A 07/03/2018   Procedure: LAPAROSCOPIC DIVERTED COLOSTOMY;  Surgeon: Ralene Ok, MD;  Location: [place];  Service: General;  Laterality: N/A;  . SHOULDER ARTHROSCOPY  2009   right  . TOOTH EXTRACTION    . TOTAL HIP ARTHROPLASTY  2002    left    FAMILY HISTORY: Family  History  Problem Relation Age of Onset  . Diabetes Mother   . Stroke Mother   . Cancer Brother   . Kidney disease Sister     SOCIAL HISTORY: Social History   Socioeconomic History  . Marital status: Widowed    Spouse name: Not on file  . Number of children: 4  . Years of education: Not on file  . Highest education level: 8th grade  Occupational History  . Occupation: Retired  Tobacco Use  . Smoking status: Former Smoker    Types: Cigarettes  . Smokeless tobacco: Never Used  Substance and Sexual Activity  . Alcohol use: No    Alcohol/week: 0.0 standard drinks  .  Drug use: No  . Sexual activity: Not on file  Other Topics Concern  . Not on file  Social History Narrative   Lives w/daughter, Carmen   Caffeine- 2 cups   Social Determinants of Health   Financial Resource Strain:   . Difficulty of Paying Living Expenses:   Food Insecurity:   . Worried About Programme researcher, broadcasting/film/video in the Last Year:   . Barista in the Last Year:   Transportation Needs:   . Freight forwarder (Medical):   Marland Kitchen Lack of Transportation (Non-Medical):   Physical Activity:   . Days of Exercise per Week:   . Minutes of Exercise per Session:   Stress:   . Feeling of Stress :   Social Connections:   . Frequency of Communication with Friends and Family:   . Frequency of Social Gatherings with Friends and Family:   . Attends Religious Services:   . Active Member of Clubs or Organizations:   . Attends Banker Meetings:   Marland Kitchen Marital Status:   Intimate Partner Violence:   . Fear of Current or Ex-Partner:   . Emotionally Abused:   Marland Kitchen Physically Abused:   . Sexually Abused:      PHYSICAL EXAM  GENERAL EXAM/CONSTITUTIONAL: Vitals:  Vitals:   02/26/20 1315  BP: 135/66  Pulse: 64  Temp: (!) 97 F (36.1 C)  Weight: 218 lb (98.9 kg)   Body mass index is 38.62 kg/m. Wt Readings from Last 3 Encounters:  02/26/20 218 lb (98.9 kg)  02/05/20 218 lb (98.9 kg)  01/04/20 218  lb 4.8 oz (99 kg)     well developed, nourished and groomed; neck is supple    CARDIOVASCULAR:  Examination of carotid arteries is normal; no carotid bruits  Regular rate and rhythm, no murmurs  Examination of peripheral vascular system by observation and palpation is normal  EYES:  Ophthalmoscopic exam of optic discs and posterior segments is normal; no papilledema or hemorrhages No exam data present  MUSCULOSKELETAL:  Gait, strength, tone, movements noted in Neurologic exam below  NEUROLOGIC: MENTAL STATUS:  No flowsheet data found.  awake, alert, oriented to person, place and time  recent and remote memory intact  normal attention and concentration  language fluent, comprehension intact, naming intact  fund of knowledge appropriate  CRANIAL NERVE:   2nd - no papilledema on fundoscopic exam  2nd, 3rd, 4th, 6th - pupils equal and reactive to light, visual fields full to confrontation, extraocular muscles intact, no nystagmus  5th - facial sensation symmetric  7th - facial strength symmetric  8th - hearing intact  9th - palate elevates symmetrically, uvula midline  11th - shoulder shrug symmetric  12th - tongue protrusion midline  MOTOR:   normal bulk and tone, full strength in the BUE, BLE  SENSORY:   normal and symmetric to light touch  COORDINATION:   finger-nose-finger, fine finger movements normal  REFLEXES:   deep tendon reflexes TRACE and symmetric  GAIT/STATION:   IN WHEELCHAIR     DIAGNOSTIC DATA (LABS, IMAGING, TESTING) - I reviewed patient records, labs, notes, testing and imaging myself where available.  Lab Results  Component Value Date   WBC 10.0 01/04/2020   HGB 12.3 01/04/2020   HCT 37.0 01/04/2020   MCV 94.1 01/04/2020   PLT 147 (L) 01/04/2020      Component Value Date/Time   NA 130 (L) 01/04/2020 1154   K 4.4 01/04/2020 1154   CL 92 (L)  01/04/2020 1154   CO2 30 01/04/2020 1154   GLUCOSE 122 (H)  01/04/2020 1154   BUN 30 (H) 01/04/2020 1154   CREATININE 1.21 (H) 01/04/2020 1154   CALCIUM 9.0 01/04/2020 1154   PROT 4.9 (L) 07/12/2018 0447   ALBUMIN 1.6 (L) 07/12/2018 0447   AST 49 (H) 07/12/2018 0447   ALT 57 (H) 07/12/2018 0447   ALKPHOS 140 (H) 07/12/2018 0447   BILITOT 0.6 07/12/2018 0447   GFRNONAA 39 (L) 01/04/2020 1154   GFRAA 46 (L) 01/04/2020 1154   No results found for: CHOL, HDL, LDLCALC, LDLDIRECT, TRIG, CHOLHDL No results found for: TSVX7L No results found for: VITAMINB12 Lab Results  Component Value Date   TSH 0.372 03/06/2016    06/27/19 MR FACE / TRIGEMINAL  - unremarkable    ASSESSMENT AND PLAN  86 y.o. year old female here with new onset right lower jaw pain since summer 2019, status post #29 extraction, with increasing pain.  No specific dental pathology found.  Could represent idiopathic trigeminal neuralgia.    Dx:  1. Right trigeminal neuralgia     PLAN:  TRIGEMINAL NEURALGIA  - continue CBZ 300mg  twice a day; may increase over time as tolerated; check CBC, CMP every 6-12 months - may consider gamma knife treatment in future  Meds ordered this encounter  Medications  . carbamazepine (CARBATROL) 300 MG 12 hr capsule    Sig: Take 1 capsule (300 mg total) by mouth 2 (two) times daily.    Dispense:  180 capsule    Refill:  4   Return in about 1 year (around 02/25/2021).    02/27/2021, MD 02/26/2020, 1:48 PM Certified in Neurology, Neurophysiology and Neuroimaging  [facility] [address] [place], [place] [phone]

## 2020-03-04 ENCOUNTER — Other Ambulatory Visit: Payer: Self-pay

## 2020-03-04 ENCOUNTER — Ambulatory Visit (INDEPENDENT_AMBULATORY_CARE_PROVIDER_SITE_OTHER): Payer: Medicare Other

## 2020-03-04 DIAGNOSIS — R339 Retention of urine, unspecified: Secondary | ICD-10-CM

## 2020-03-04 NOTE — Progress Notes (Signed)
Cath Change/ Replacement  Patient is present today for a catheter change due to urinary retention.  70ml of water was removed from the balloon, a 16FR foley cath was removed with out difficulty.  Patient was cleaned and prepped in a sterile fashion with betadine. A 16 FR foley cath was replaced into the bladder no complications were noted Urine return was noted 52ml and urine was yellow w/blood in color. The balloon was filled with 25ml of sterile water. A bedside bag was attached for drainage. Patient was given proper instruction on catheter care.    Performed by: H. CobbLPN  Follow up: 1 month cath change

## 2020-03-14 ENCOUNTER — Encounter (HOSPITAL_COMMUNITY): Payer: Self-pay

## 2020-03-14 ENCOUNTER — Emergency Department (HOSPITAL_COMMUNITY): Payer: Medicare Other

## 2020-03-14 ENCOUNTER — Observation Stay (HOSPITAL_COMMUNITY)
Admission: EM | Admit: 2020-03-14 | Discharge: 2020-03-15 | Disposition: A | Discharge status: Home / Self Care | Payer: Medicare Other | Attending: Internal Medicine | Admitting: Internal Medicine

## 2020-03-14 ENCOUNTER — Other Ambulatory Visit: Payer: Self-pay

## 2020-03-14 DIAGNOSIS — R9431 Abnormal electrocardiogram [ECG] [EKG]: Secondary | ICD-10-CM | POA: Diagnosis not present

## 2020-03-14 DIAGNOSIS — I10 Essential (primary) hypertension: Secondary | ICD-10-CM

## 2020-03-14 DIAGNOSIS — R059 Cough, unspecified: Secondary | ICD-10-CM

## 2020-03-14 DIAGNOSIS — Z8673 Personal history of transient ischemic attack (TIA), and cerebral infarction without residual deficits: Secondary | ICD-10-CM | POA: Diagnosis not present

## 2020-03-14 DIAGNOSIS — N390 Urinary tract infection, site not specified: Secondary | ICD-10-CM

## 2020-03-14 DIAGNOSIS — Z87891 Personal history of nicotine dependence: Secondary | ICD-10-CM | POA: Diagnosis not present

## 2020-03-14 DIAGNOSIS — R5381 Other malaise: Secondary | ICD-10-CM | POA: Diagnosis present

## 2020-03-14 DIAGNOSIS — I5033 Acute on chronic diastolic (congestive) heart failure: Secondary | ICD-10-CM | POA: Insufficient documentation

## 2020-03-14 DIAGNOSIS — Z20822 Contact with and (suspected) exposure to covid-19: Secondary | ICD-10-CM | POA: Insufficient documentation

## 2020-03-14 DIAGNOSIS — T83511A Infection and inflammatory reaction due to indwelling urethral catheter, initial encounter: Principal | ICD-10-CM | POA: Insufficient documentation

## 2020-03-14 DIAGNOSIS — G9340 Encephalopathy, unspecified: Secondary | ICD-10-CM | POA: Diagnosis not present

## 2020-03-14 DIAGNOSIS — N1832 Chronic kidney disease, stage 3b: Secondary | ICD-10-CM | POA: Diagnosis not present

## 2020-03-14 DIAGNOSIS — Z7982 Long term (current) use of aspirin: Secondary | ICD-10-CM | POA: Diagnosis not present

## 2020-03-14 DIAGNOSIS — Z96642 Presence of left artificial hip joint: Secondary | ICD-10-CM | POA: Diagnosis not present

## 2020-03-14 DIAGNOSIS — R05 Cough: Secondary | ICD-10-CM

## 2020-03-14 DIAGNOSIS — G4733 Obstructive sleep apnea (adult) (pediatric): Secondary | ICD-10-CM | POA: Diagnosis present

## 2020-03-14 DIAGNOSIS — D649 Anemia, unspecified: Secondary | ICD-10-CM | POA: Diagnosis not present

## 2020-03-14 DIAGNOSIS — I130 Hypertensive heart and chronic kidney disease with heart failure and stage 1 through stage 4 chronic kidney disease, or unspecified chronic kidney disease: Secondary | ICD-10-CM | POA: Diagnosis not present

## 2020-03-14 DIAGNOSIS — Z96653 Presence of artificial knee joint, bilateral: Secondary | ICD-10-CM | POA: Insufficient documentation

## 2020-03-14 DIAGNOSIS — I5032 Chronic diastolic (congestive) heart failure: Secondary | ICD-10-CM

## 2020-03-14 LAB — CBC WITH DIFFERENTIAL/PLATELET
Abs Immature Granulocytes: 0.09 10*3/uL — ABNORMAL HIGH (ref 0.00–0.07)
Basophils Absolute: 0 10*3/uL (ref 0.0–0.1)
Basophils Relative: 0 %
Eosinophils Absolute: 0.1 10*3/uL (ref 0.0–0.5)
Eosinophils Relative: 1 %
HCT: 39.1 % (ref 36.0–46.0)
Hemoglobin: 12.8 g/dL (ref 12.0–15.0)
Immature Granulocytes: 1 %
Lymphocytes Relative: 26 %
Lymphs Abs: 2.9 10*3/uL (ref 0.7–4.0)
MCH: 32.3 pg (ref 26.0–34.0)
MCHC: 32.7 g/dL (ref 30.0–36.0)
MCV: 98.7 fL (ref 80.0–100.0)
Monocytes Absolute: 0.9 10*3/uL (ref 0.1–1.0)
Monocytes Relative: 9 %
Neutro Abs: 7 10*3/uL (ref 1.7–7.7)
Neutrophils Relative %: 63 %
Platelets: 148 10*3/uL — ABNORMAL LOW (ref 150–400)
RBC: 3.96 MIL/uL (ref 3.87–5.11)
RDW: 14.9 % (ref 11.5–15.5)
WBC: 11 10*3/uL — ABNORMAL HIGH (ref 4.0–10.5)
nRBC: 0 % (ref 0.0–0.2)

## 2020-03-14 LAB — URINALYSIS, ROUTINE W REFLEX MICROSCOPIC
Bilirubin Urine: NEGATIVE
Glucose, UA: NEGATIVE mg/dL
Ketones, ur: NEGATIVE mg/dL
Nitrite: NEGATIVE
Protein, ur: NEGATIVE mg/dL
Specific Gravity, Urine: 1.008 (ref 1.005–1.030)
pH: 9 — ABNORMAL HIGH (ref 5.0–8.0)

## 2020-03-14 LAB — COMPREHENSIVE METABOLIC PANEL
ALT: 13 U/L (ref 0–44)
AST: 16 U/L (ref 15–41)
Albumin: 3.8 g/dL (ref 3.5–5.0)
Alkaline Phosphatase: 111 U/L (ref 38–126)
Anion gap: 11 (ref 5–15)
BUN: 48 mg/dL — ABNORMAL HIGH (ref 8–23)
CO2: 26 mmol/L (ref 22–32)
Calcium: 8.9 mg/dL (ref 8.9–10.3)
Chloride: 101 mmol/L (ref 98–111)
Creatinine, Ser: 1.39 mg/dL — ABNORMAL HIGH (ref 0.44–1.00)
GFR calc Af Amer: 39 mL/min — ABNORMAL LOW (ref >60)
GFR calc non Af Amer: 33 mL/min — ABNORMAL LOW (ref >60)
Glucose, Bld: 118 mg/dL — ABNORMAL HIGH (ref 70–99)
Potassium: 4.1 mmol/L (ref 3.5–5.1)
Sodium: 138 mmol/L (ref 135–145)
Total Bilirubin: 0.6 mg/dL (ref 0.3–1.2)
Total Protein: 6.6 g/dL (ref 6.5–8.1)

## 2020-03-14 LAB — RESPIRATORY PANEL BY RT PCR (FLU A&B, COVID)
Influenza A by PCR: NEGATIVE
Influenza B by PCR: NEGATIVE
SARS Coronavirus 2 by RT PCR: NEGATIVE

## 2020-03-14 LAB — TSH: TSH: 2.498 u[IU]/mL (ref 0.350–4.500)

## 2020-03-14 LAB — TROPONIN I (HIGH SENSITIVITY)
Troponin I (High Sensitivity): 197 ng/L (ref <18)
Troponin I (High Sensitivity): 206 ng/L (ref <18)

## 2020-03-14 LAB — PROTIME-INR
INR: 0.9 (ref 0.8–1.2)
Prothrombin Time: 11.5 seconds (ref 11.4–15.2)

## 2020-03-14 LAB — AMMONIA: Ammonia: 12 umol/L (ref 9–35)

## 2020-03-14 LAB — MAGNESIUM: Magnesium: 2.2 mg/dL (ref 1.7–2.4)

## 2020-03-14 LAB — LIPASE, BLOOD: Lipase: 34 U/L (ref 11–51)

## 2020-03-14 MED ORDER — LEVOTHYROXINE SODIUM 75 MCG PO TABS
150.0000 ug | ORAL_TABLET | Freq: Every day | ORAL | Status: DC
  Administered 2020-03-15: 06:00:00 150 ug via ORAL
  Filled 2020-03-14: qty 2

## 2020-03-14 MED ORDER — FUROSEMIDE 40 MG PO TABS
40.0000 mg | ORAL_TABLET | Freq: Every day | ORAL | Status: DC
  Administered 2020-03-14 – 2020-03-15 (×2): 40 mg via ORAL
  Filled 2020-03-14 (×2): qty 1

## 2020-03-14 MED ORDER — MAGNESIUM SULFATE IN D5W 1-5 GM/100ML-% IV SOLN
1.0000 g | Freq: Once | INTRAVENOUS | Status: AC
  Administered 2020-03-14: 20:00:00 1 g via INTRAVENOUS
  Filled 2020-03-14 (×2): qty 100

## 2020-03-14 MED ORDER — TRAZODONE HCL 50 MG PO TABS: 50.0000 mg | ORAL_TABLET | Freq: Every day | ORAL | Status: DC

## 2020-03-14 MED ORDER — SERTRALINE HCL 50 MG PO TABS: 50.0000 mg | ORAL_TABLET | Freq: Every day | ORAL | Status: DC

## 2020-03-14 MED ORDER — PANTOPRAZOLE SODIUM 40 MG PO TBEC
40.0000 mg | DELAYED_RELEASE_TABLET | Freq: Every day | ORAL | Status: DC
  Administered 2020-03-14 – 2020-03-15 (×2): 40 mg via ORAL
  Filled 2020-03-14 (×2): qty 1

## 2020-03-14 MED ORDER — ALBUTEROL SULFATE (2.5 MG/3ML) 0.083% IN NEBU: 3.0000 mL | INHALATION_SOLUTION | Freq: Four times a day (QID) | RESPIRATORY_TRACT | Status: DC | PRN

## 2020-03-14 MED ORDER — CARBAMAZEPINE ER 100 MG PO TB12
300.0000 mg | ORAL_TABLET | Freq: Two times a day (BID) | ORAL | Status: DC
  Administered 2020-03-14 – 2020-03-15 (×2): 300 mg via ORAL
  Filled 2020-03-14 (×2): qty 3

## 2020-03-14 MED ORDER — ASPIRIN EC 81 MG PO TBEC
81.0000 mg | DELAYED_RELEASE_TABLET | Freq: Every day | ORAL | Status: DC
  Administered 2020-03-14 – 2020-03-15 (×2): 81 mg via ORAL
  Filled 2020-03-14 (×2): qty 1

## 2020-03-14 MED ORDER — HEPARIN SODIUM (PORCINE) 5000 UNIT/ML IJ SOLN
5000.0000 [IU] | Freq: Three times a day (TID) | INTRAMUSCULAR | Status: DC
  Administered 2020-03-15: 5000 [IU] via SUBCUTANEOUS
  Filled 2020-03-14: qty 1

## 2020-03-14 MED ORDER — SODIUM CHLORIDE 0.9 % IV SOLN
1.0000 g | INTRAVENOUS | Status: DC
  Administered 2020-03-15: 15:00:00 1 g via INTRAVENOUS
  Filled 2020-03-14: qty 10

## 2020-03-14 MED ORDER — NITROGLYCERIN 0.4 MG SL SUBL: 0.4000 mg | SUBLINGUAL_TABLET | SUBLINGUAL | Status: DC | PRN

## 2020-03-14 MED ORDER — ALLOPURINOL 100 MG PO TABS
100.0000 mg | ORAL_TABLET | Freq: Every day | ORAL | Status: DC
  Administered 2020-03-14 – 2020-03-15 (×2): 100 mg via ORAL
  Filled 2020-03-14 (×2): qty 1

## 2020-03-14 MED ORDER — ALPRAZOLAM 0.5 MG PO TABS
0.5000 mg | ORAL_TABLET | Freq: Every day | ORAL | Status: DC
  Administered 2020-03-14: 0.5 mg via ORAL
  Filled 2020-03-14: qty 1

## 2020-03-14 MED ORDER — ADULT MULTIVITAMIN W/MINERALS CH
1.0000 | ORAL_TABLET | Freq: Every day | ORAL | Status: DC
  Administered 2020-03-15: 10:00:00 1 via ORAL
  Filled 2020-03-14: qty 1

## 2020-03-14 MED ORDER — HYDRALAZINE HCL 20 MG/ML IJ SOLN: 5.0000 mg | Freq: Four times a day (QID) | INTRAMUSCULAR | Status: DC | PRN

## 2020-03-14 MED ORDER — SODIUM CHLORIDE 0.9 % IV SOLN
1.0000 g | Freq: Once | INTRAVENOUS | Status: AC
  Administered 2020-03-14: 16:00:00 1 g via INTRAVENOUS
  Filled 2020-03-14: qty 10

## 2020-03-14 NOTE — ED Notes (Signed)
Date and time results received: 03/14/20 1641 (use smartphrase ".now" to insert current time)  Test: Troponin  Critical Value: 206  Name of Provider Notified: Dr. Randol Kern   Orders Received? Or Actions Taken?: None yet

## 2020-03-14 NOTE — ED Provider Notes (Signed)
Emergency Department Provider Note   I have reviewed the triage vital signs and the nursing notes.   HISTORY  Chief Complaint Altered Mental Status   HPI COLLEN HOSTLER is a 86 y.o. female with PMH of HTN, GERD, suprapubic catheter and colostomy presents to the ED by EMS from home with AMS noted this AM by family.  When the family saw the patient this morning he felt like she was not her normal self.  She seems slightly more confused and they noticed some involuntary head movements and tremor type movements.  Patient did not lose consciousness or have obvious seizure.  There is no report of fever or chills.  Patient is able to tell me she is not having any pain in her chest or abdomen.  She describes some cough without shortness of breath. She is not sure about new medications at home and tells me that her daughter takes care of that for her. Denies recent falls.    Past Medical History:  Diagnosis Date  . Chest pain   . Depression   . GERD (gastroesophageal reflux disease)   . Hypertension   . Sleep apnea   . Thyroid disease   . Trigeminal neuralgia     Patient Active Problem List   Diagnosis Date Noted  . Urinary retention 12/04/2019  . Encounter for care or replacement of suprapubic tube (HCC) 12/04/2019  . Skin ulcer of sacrum (HCC)   . Acute gout of left hand   . Acute blood loss anemia   . Acute on chronic diastolic (congestive) heart failure (HCC)   . Sepsis (HCC) 07/11/2018  . Debility   . OSA (obstructive sleep apnea)   . Diastolic dysfunction   . AKI (acute kidney injury) (HCC)   . Post-operative pain   . Tachypnea   . Steroid-induced hyperglycemia   . Leukocytosis   . Pressure injury of skin 06/27/2018  . Cellulitis and abscess of buttock   . Gastrointestinal hemorrhage   . Hypotension 06/26/2018  . ARF (acute renal failure) (HCC) 06/26/2018  . Black stool 06/26/2018  . Anemia 06/26/2018  . Bacteremia 03/07/2016  . Atypical chest pain 03/07/2016  .  Acute-on-chronic kidney injury (HCC) 03/07/2016  . Essential hypertension 03/07/2016  . Chronic diastolic congestive heart failure (HCC) 03/07/2016  . Hypothyroidism 03/07/2016  . Depression with anxiety 03/07/2016  . Lower urinary tract infectious disease   . Near syncope 08/20/2015  . Morbid obesity (HCC) 08/20/2015  . Pain in the chest 07/06/2015  . Awareness alteration, transient 05/07/2015  . History of stroke 05/07/2015    Past Surgical History:  Procedure Laterality Date  . APPENDECTOMY  1946  . CATARACT EXTRACTION     bilateral  . CYSTOSCOPY N/A 02/11/2019   Procedure: CYSTOSCOPY;  Surgeon: Malen Gauze, MD;  Location: [facility];  Service: Urology;  Laterality: N/A;  . GALLBLADDER SURGERY    . INCISION AND DRAINAGE ABSCESS N/A 06/29/2018   Procedure: REPEAT INCISION AND DRAINAGE  BUTTOCK ABSCESS;  Surgeon: Harriette Bouillon, MD;  Location: [facility] OR;  Service: General;  Laterality: N/A;  . INCISION AND DRAINAGE PERIRECTAL ABSCESS Left 06/27/2018   Procedure: IRRIGATION AND DEBRIDEMENT BUTTOCK ABSCESS;  Surgeon: Andria Meuse, MD;  Location: [facility] OR;  Service: General;  Laterality: Left;  . INSERTION OF SUPRAPUBIC CATHETER N/A 07/03/2018   Procedure: INSERTION OF SUPRAPUBIC CATHETER;  Surgeon: Crista Elliot, MD;  Location: [facility];  Service: Urology;  Laterality: N/A;  . INSERTION OF SUPRAPUBIC  CATHETER N/A 02/11/2019   Procedure: INSERTION OF SUPRAPUBIC CATHETER;  Surgeon: Malen Gauze, MD;  Location: [facility];  Service: Urology;  Laterality: N/A;  30 MINS  . IR CATHETER TUBE CHANGE  01/09/2020  . KNEE ARTHROPLASTY  2006   left   . KNEE ARTHROPLASTY  2006   right   . LAPAROSCOPIC DIVERTED COLOSTOMY N/A 07/03/2018   Procedure: LAPAROSCOPIC DIVERTED COLOSTOMY;  Surgeon: Axel Filler, MD;  Location: [facility] OR;  Service: General;  Laterality: N/A;  . SHOULDER ARTHROSCOPY  2009   right  . TOOTH EXTRACTION    . TOTAL HIP ARTHROPLASTY  2002    left    Allergies Morphine  and related, Tramadol, Ciprofloxacin, Codeine, Gabapentin, Lipitor [atorvastatin], Lyrica [pregabalin], Motrin [ibuprofen], Sulfa antibiotics, and Tylenol [acetaminophen]  Family History  Problem Relation Age of Onset  . Diabetes Mother   . Stroke Mother   . Cancer Brother   . Kidney disease Sister     Social History Social History   Tobacco Use  . Smoking status: Former Smoker    Types: Cigarettes  . Smokeless tobacco: Never Used  Substance Use Topics  . Alcohol use: No    Alcohol/week: 0.0 standard drinks  . Drug use: No    Review of Systems  Constitutional: No fever/chills. Positive confusion that AM.  Eyes: No visual changes. ENT: No sore throat. Cardiovascular: Denies chest pain. Respiratory: Denies shortness of breath. Gastrointestinal: No abdominal pain.  No nausea, no vomiting.  No diarrhea.  No constipation. Musculoskeletal: Negative for back pain. Skin: Negative for rash. Neurological: Negative for headaches, focal weakness or numbness. Twitching movements of the head.   10-point ROS otherwise negative.  ____________________________________________   PHYSICAL EXAM:  VITAL SIGNS: ED Triage Vitals  Enc Vitals Group     BP 03/14/20 1256 (!) 153/67     Pulse Rate 03/14/20 1256 67     Resp 03/14/20 1256 20     Temp 03/14/20 1256 97.6 F (36.4 C)     Temp Source 03/14/20 1256 Oral     SpO2 03/14/20 1256 98 %     Weight 03/14/20 1253 218 lb (98.9 kg)     Height 03/14/20 1253 5\' 4"  (1.626 m)   Constitutional: Alert with mild confusion but able to tell me her name, location and DOB. Well appearing and in no acute distress. Eyes: Conjunctivae are normal.  Head: Atraumatic. Nose: No congestion/rhinnorhea. Mouth/Throat: Mucous membranes are moist.  Neck: No stridor.  Cardiovascular: Normal rate, regular rhythm. Good peripheral circulation. Grossly normal heart sounds.   Respiratory: Normal respiratory effort.  No retractions. Lungs CTAB. Gastrointestinal:  Soft and nontender. No distention.  Musculoskeletal: No gross deformities of extremities. Neurologic:  Normal speech and language. Intermittent myoclonic type jerking of the head. Positive asterixis. No pronator drift. No facial asymmetry.  Skin:  Skin is warm, dry and intact. No rash noted.  ____________________________________________   LABS (all labs ordered are listed, but only abnormal results are displayed)  Labs Reviewed  COMPREHENSIVE METABOLIC PANEL - Abnormal; Notable for the following components:      Result Value   Glucose, Bld 118 (*)    BUN 48 (*)    Creatinine, Ser 1.39 (*)    GFR calc non Af Amer 33 (*)    GFR calc Af Amer 39 (*)    All other components within normal limits  CBC WITH DIFFERENTIAL/PLATELET - Abnormal; Notable for the following components:   WBC 11.0 (*)    Platelets 148 (*)  Abs Immature Granulocytes 0.09 (*)    All other components within normal limits  URINALYSIS, ROUTINE W REFLEX MICROSCOPIC - Abnormal; Notable for the following components:   APPearance HAZY (*)    pH 9.0 (*)    Hgb urine dipstick SMALL (*)    Leukocytes,Ua LARGE (*)    Bacteria, UA RARE (*)    All other components within normal limits  TROPONIN I (HIGH SENSITIVITY) - Abnormal; Notable for the following components:   Troponin I (High Sensitivity) 197 (*)    All other components within normal limits  RESPIRATORY PANEL BY RT PCR (FLU A&B, COVID)  URINE CULTURE  LIPASE, BLOOD  PROTIME-INR  AMMONIA  TSH  TROPONIN I (HIGH SENSITIVITY)   ____________________________________________  EKG   EKG Interpretation  Date/Time:  Saturday March 14 2020 13:04:03 EDT Ventricular Rate:  104 PR Interval:    QRS Duration: 107 QT Interval:  439 QTC Calculation: 578 R Axis:   17 Text Interpretation: Atrial fibrillation Ventricular premature complex Low voltage, extremity leads Nonspecific T abnormalities, anterior leads No STEMI Confirmed by Alona Bene [phone]) on 03/14/2020  1:09:51 PM       ____________________________________________  RADIOLOGY  DG Chest 1 View  Result Date: 03/14/2020 CLINICAL DATA:  Cough EXAM: CHEST  1 VIEW COMPARISON:  06/29/2018 FINDINGS: Cardiomegaly with indistinct pulmonary vasculature favoring pulmonary venous hypertension. No overt edema. No blunting of the costophrenic angles. Atherosclerotic calcification of the aortic arch. Prominent degenerative glenohumeral arthropathy bilaterally. Thoracic spondylosis. Airway thickening is present, suggesting bronchitis or reactive airways disease. IMPRESSION: 1. Airway thickening is present, suggesting bronchitis or reactive airways disease. 2. Cardiomegaly with pulmonary venous hypertension. No overt edema. 3. Prominent degenerative glenohumeral arthropathy bilaterally. 4. Thoracic spondylosis. Electronically Signed   By: Gaylyn Rong M.D.   On: 03/14/2020 14:06   CT Head Wo Contrast  Result Date: 03/14/2020 CLINICAL DATA:  Encephalopathy. EXAM: CT HEAD WITHOUT CONTRAST TECHNIQUE: Contiguous axial images were obtained from the base of the skull through the vertex without intravenous contrast. COMPARISON:  None. FINDINGS: Brain: Mild diffuse cortical atrophy is noted. Mild chronic ischemic white matter disease is noted. No mass effect or midline shift is noted. Ventricular size is within normal limits. There is no evidence of mass lesion, hemorrhage or acute infarction. Vascular: No hyperdense vessel or unexpected calcification. Skull: Normal. Negative for fracture or focal lesion. Sinuses/Orbits: No acute finding. Other: None. IMPRESSION: Mild diffuse cortical atrophy. Mild chronic ischemic white matter disease. No acute intracranial abnormality seen. Electronically Signed   By: Lupita Raider M.D.   On: 03/14/2020 13:47    ____________________________________________   PROCEDURES  Procedure(s) performed:   Procedures  None ____________________________________________   INITIAL  IMPRESSION / ASSESSMENT AND PLAN / ED COURSE  Pertinent labs & imaging results that were available during my care of the patient were reviewed by me and considered in my medical decision making (see chart for details).   Patient presents to the ED with AMS. Family in route to provide further history. Noting some myoclonic jerking of the head here with asterixis on exam.  Plan for screening lab work along with CT imaging of the head, chest x-ray with cough, and reassess. Suspect metabolic encephalopathy type presentation with no sign of partial seizure on exam.   Labs reviewed.  Patient's daughter and caregiver is not at bedside and is able to provide additional history.  She states that this event has occurred briefly in the past but resolved spontaneously typically.  In terms of  the medications she has changed the dose of the thyroid medication in the past week and had a visit with the primary care doctor for very high blood pressure.  This has improved but during the visit with her PCP last week she also was found to be in atrial fibrillation.  Because of her carbamazepine they were not able to start anticoagulation and have this discussion with the family.  The daughter also states that the patient was complaining of some sense of needing to urinate and having some discomfort.  She came AZO and symptoms seem to improve but that has been several days ago.   02:50 PM  Labs showing troponin of 197 of unclear significance.  EKG is unremarkable.  We will continue to trend this but will hold on heparin or cardiology discussion.  Patient is alert and oriented to the point where she can tell me she is not experiencing chest pain or other apparent ACS equivalents.  Back to more toxic/metabolic type presentation.  Given her dysuria earlier in the week and UA here will treat with antibiotics and sent for culture.  This very well could be bacterial colonization but given her altered mental status and symptoms  earlier in the week we will err on the side of treatment.   Discussed patient's case with [facility] to request admission. Patient and family (if present) updated with plan. Care transferred to [facility] service.  I reviewed all nursing notes, vitals, pertinent old records, EKGs, labs, imaging (as available).  ____________________________________________  FINAL CLINICAL IMPRESSION(S) / ED DIAGNOSES  Final diagnoses:  Encephalopathy acute  Prolonged Q-T interval on ECG    MEDICATIONS GIVEN DURING THIS VISIT:  Medications  cefTRIAXone (ROCEPHIN) 1 g in sodium chloride 0.9 % 100 mL IVPB (has no administration in time range)    Note:  This document was prepared using Dragon voice recognition software and may include unintentional dictation errors.  Nanda Quinton, MD, [facility] Emergency Medicine    Faustine Tates, [facility], MD 03/14/20 [phone]

## 2020-03-14 NOTE — ED Notes (Signed)
Pt to CT

## 2020-03-14 NOTE — ED Notes (Signed)
Date and time results received: 03/14/20 1415 (use smartphrase ".now" to insert current time)  Test: Trop 197 Critical Value: 197  Name of Provider Notified: Dr. Jacqulyn Bath   Orders Received? Or Actions Taken?: None yet

## 2020-03-14 NOTE — H&P (Signed)
TRH H&P   Patient Demographics:    [name], is a 86 y.o. female  MRN: [id number]   DOB - July 10, 1928  Admit Date - 03/14/2020  Outpatient Primary MD for the patient is Lavone Orn, MD  Referring MD/NP/PA:  Dr Laverta Baltimore  Patient coming from: Home  Chief Complaint  Patient presents with  . Altered Mental Status      HPI:    [name]  is a 86 y.o. female, with past medical history of hypertension, GERD, suprapubic catheter and colostomy secondary to chronic sacral pressure ulcer which healed, she presents to ED secondary to altered mental status, and neck twitching/involuntary movement noticed by her daughter this morning, patient with no loss of consciousness, no evidence of seizures, currently mentation back at baseline, no fever, no chills, she denies any chest pain, no dyspnea, he does report some cough, there is no new medication she has been taking, patient recently had her suprapubic catheter changed, as well she had IM steroids chart in her right hip ended to chronic arthritis, as well she was seen recently by her PCP where she was found to have new onset A. fib, and decision has been made not to start anticoagulation given she is chronically on carbamazepine. - In ED her work-up was significant for abnormal urine analysis, patient does report she feels some spasms and discomfort, she had mild leukocytosis at 11, CT head findings, chest x-ray with no evidence of infection, patient was started on Rocephin for UTI, mentation is back at baseline, I was called to admit for further evaluation.    Review of systems:    In addition to the HPI above,  No Fever-chills, her daughter had some mild confusion earlier today, but mentation back to baseline . No Headache, No changes with Vision or hearing, No problems swallowing food or Liquids, No Chest pain, Cough or Shortness of  Breath, No Abdominal pain, No Nausea or Vommitting, Bowel movements are regular, No Blood in stool or Urine, No dysuria, she has suprapubic indwelling catheter, patient reports some bladder spasms No new skin rashes or bruises, No new joints pains-aches,  No new weakness, tingling, numbness in any extremity, No recent weight gain or loss, No polyuria, polydypsia or polyphagia, No significant Mental Stressors.  A full 10 point Review of Systems was done, except as stated above, all other Review of Systems were negative.   With Past History of the following :    Past Medical History:  Diagnosis Date  . Chest pain   . Depression   . GERD (gastroesophageal reflux disease)   . Hypertension   . Sleep apnea   . Thyroid disease   . Trigeminal neuralgia       Past Surgical History:  Procedure Laterality Date  . APPENDECTOMY  1946  . CATARACT EXTRACTION     bilateral  . CYSTOSCOPY N/A 02/11/2019   Procedure:  CYSTOSCOPY;  Surgeon: Malen GauzeMcKenzie, Patrick L, MD;  Location: [facility];  Service: Urology;  Laterality: N/A;  . GALLBLADDER SURGERY    . INCISION AND DRAINAGE ABSCESS N/A 06/29/2018   Procedure: REPEAT INCISION AND DRAINAGE  BUTTOCK ABSCESS;  Surgeon: Harriette Bouillonornett, Thomas, MD;  Location: [facility];  Service: General;  Laterality: N/A;  . INCISION AND DRAINAGE PERIRECTAL ABSCESS Left 06/27/2018   Procedure: IRRIGATION AND DEBRIDEMENT BUTTOCK ABSCESS;  Surgeon: Andria MeuseWhite, Christopher M, MD;  Location: [facility];  Service: General;  Laterality: Left;  . INSERTION OF SUPRAPUBIC CATHETER N/A 07/03/2018   Procedure: INSERTION OF SUPRAPUBIC CATHETER;  Surgeon: Crista ElliotBell, Eugene D III, MD;  [facility];  Service: Urology;  Laterality: N/A;  . INSERTION OF SUPRAPUBIC CATHETER N/A 02/11/2019   Procedure: INSERTION OF SUPRAPUBIC CATHETER;  Surgeon: Malen GauzeMcKenzie, Patrick L, MD;  Location: [facility];  Service: Urology;  Laterality: N/A;  30 MINS  . IR CATHETER TUBE CHANGE  01/09/2020  . KNEE ARTHROPLASTY  2006   left   . KNEE  ARTHROPLASTY  2006   right   . LAPAROSCOPIC DIVERTED COLOSTOMY N/A 07/03/2018   Procedure: LAPAROSCOPIC DIVERTED COLOSTOMY;  Surgeon: Axel Filleramirez, Armando, MD;  [facility];  Service: General;  Laterality: N/A;  . SHOULDER ARTHROSCOPY  2009   right  . TOOTH EXTRACTION    . TOTAL HIP ARTHROPLASTY  2002    left      Social History:     Social History   Tobacco Use  . Smoking status: Former Smoker    Types: Cigarettes  . Smokeless tobacco: Never Used  Substance Use Topics  . Alcohol use: No    Alcohol/week: 0.0 standard drinks     Lives -at home with daughter  Mobility -wheelchair dependent    Family History :     Family History  Problem Relation Age of Onset  . Diabetes Mother   . Stroke Mother   . Cancer Brother   . Kidney disease Sister      Home Medications:   Prior to Admission medications   Medication Sig Start Date End Date Taking? Authorizing Provider  albuterol (PROVENTIL HFA;VENTOLIN HFA) 108 (90 Base) MCG/ACT inhaler Inhale 2 puffs into the lungs every 6 (six) hours as needed for wheezing or shortness of breath. 07/19/18  Yes Angiulli, Mcarthur Rossettianiel J, PA-C  allopurinol (ZYLOPRIM) 100 MG tablet Take 1 tablet (100 mg total) by mouth daily. Patient taking differently: Take 100 mg by mouth 2 (two) times daily.  07/19/18  Yes Angiulli, Mcarthur Rossettianiel J, PA-C  ALPRAZolam Prudy Feeler(XANAX) 0.5 MG tablet Take 0.5 mg by mouth at bedtime. 01/21/19  Yes [provider]  aspirin EC 81 MG tablet Take 81 mg by mouth daily.   Yes [provider]  carbamazepine (CARBATROL) 300 MG 12 hr capsule Take 1 capsule (300 mg total) by mouth 2 (two) times daily. 02/26/20  Yes Penumalli, Glenford BayleyVikram R, MD  furosemide (LASIX) 40 MG tablet Take 1 tablet (40 mg total) by mouth daily. Patient taking differently: Take 40 mg by mouth 2 (two) times daily.  07/19/18  Yes Angiulli, Mcarthur Rossettianiel J, PA-C  guaiFENesin (MUCINEX) 600 MG 12 hr tablet Take by mouth 2 (two) times daily.   Yes [provider]   levothyroxine (SYNTHROID) 150 MCG tablet Take 150 mcg by mouth daily. 03/04/20  Yes [provider]  Multiple Vitamin (MULTIVITAMIN) tablet Take 1 tablet by mouth daily.   Yes [provider]  omeprazole (PRILOSEC) 40 MG capsule Take 1 capsule (40 mg  total) by mouth daily. Patient taking differently: Take 40 mg by mouth every other day.  07/19/18  Yes Angiulli, Mcarthur Rossetti, PA-C  sertraline (ZOLOFT) 50 MG tablet Take 1 tablet (50 mg total) by mouth daily. 07/19/18  Yes Angiulli, Mcarthur Rossetti, PA-C  traZODone (DESYREL) 50 MG tablet Take 50 mg by mouth at bedtime. 01/21/19  Yes [provider]  Wheat Dextrin (BENEFIBER DRINK MIX PO) Take by mouth.   Yes [provider]  nitroGLYCERIN (NITROSTAT) 0.4 MG SL tablet  03/07/19   [provider]     Allergies:     Allergies  Allergen Reactions  . Morphine And Related Hives and Itching  . Tramadol Other (See Comments)    vertigo  . Ciprofloxacin Itching and Rash  . Codeine Anxiety    Very agitated then unable to arouse  . Gabapentin Other (See Comments)    Alters mood  . Lipitor [Atorvastatin] Itching and Rash  . Lyrica [Pregabalin] Other (See Comments)    Lethargic, mood changes  . Motrin [Ibuprofen] Anxiety    Agitated   . Sulfa Antibiotics Rash  . Tylenol [Acetaminophen]      Physical Exam:   Vitals  Blood pressure 137/69, pulse 75, temperature 97.6 F (36.4 C), temperature source Oral, resp. rate (!) 25, height 5\' 4"  (1.626 m), weight 98.9 kg, SpO2 99 %.   1. General frail, elderly female, well-developed laying in bed in no apparent distress.  2.  Mildly diminished affect and insight, but overall she has been interactive, answering questions appropriately, she is awake alert oriented x2 .  3. No F.N deficits, ALL C.Nerves Intact, Strength 5/5 all 4 extremities, Sensation intact all 4 extremities, Plantars down going.  But significant for some tremors, minimal asterixis and mild head  twitching.  4. Ears and Eyes appear Normal, Conjunctivae clear, PERRLA. Moist Oral Mucosa.  5. Supple Neck, No JVD, No cervical lymphadenopathy appriciated, No Carotid Bruits.  6. Symmetrical Chest wall movement, Good air movement bilaterally, CTAB.  7. RRR, No Gallops, Rubs or Murmurs, No Parasternal Heave.  8. Positive Bowel Sounds, Abdomen Soft, No tenderness, she has colostomy, and suprapubic indwelling catheter, no organomegaly appriciated,No rebound -guarding or rigidity.  9.  No Cyanosis, Normal Skin Turgor, No Skin Rash or Bruise.  10. Good muscle tone,  joints appear normal , no effusions, Normal ROM.  11. No Palpable Lymph Nodes in Neck or Axillae    Data Review:    CBC Recent Labs  Lab 03/14/20 1314  WBC 11.0*  HGB 12.8  HCT 39.1  PLT 148*  MCV 98.7  MCH 32.3  MCHC 32.7  RDW 14.9  LYMPHSABS 2.9  MONOABS 0.9  EOSABS 0.1  BASOSABS 0.0   ------------------------------------------------------------------------------------------------------------------  Chemistries  Recent Labs  Lab 03/14/20 1314  NA 138  K 4.1  CL 101  CO2 26  GLUCOSE 118*  BUN 48*  CREATININE 1.39*  CALCIUM 8.9  AST 16  ALT 13  ALKPHOS 111  BILITOT 0.6   ------------------------------------------------------------------------------------------------------------------ estimated creatinine clearance is 30.7 mL/min (A) (by C-G formula based on SCr of 1.39 mg/dL (H)). ------------------------------------------------------------------------------------------------------------------ Recent Labs    03/14/20 1314  TSH 2.498    Coagulation profile Recent Labs  Lab 03/14/20 1314  INR 0.9   ------------------------------------------------------------------------------------------------------------------- No results for input(s): DDIMER in the last 72  hours. -------------------------------------------------------------------------------------------------------------------  Cardiac Enzymes No results for input(s): CKMB, TROPONINI, MYOGLOBIN in the last 168 hours.  Invalid input(s): CK ------------------------------------------------------------------------------------------------------------------ No results found for: BNP   ---------------------------------------------------------------------------------------------------------------  Urinalysis    Component Value Date/Time   COLORURINE YELLOW 03/14/2020 1310   APPEARANCEUR HAZY (A) 03/14/2020 1310   LABSPEC 1.008 03/14/2020 1310   PHURINE 9.0 (H) 03/14/2020 1310   GLUCOSEU NEGATIVE 03/14/2020 1310   HGBUR SMALL (A) 03/14/2020 1310   BILIRUBINUR NEGATIVE 03/14/2020 1310   KETONESUR NEGATIVE 03/14/2020 1310   PROTEINUR NEGATIVE 03/14/2020 1310   NITRITE NEGATIVE 03/14/2020 1310   LEUKOCYTESUR LARGE (A) 03/14/2020 1310    ----------------------------------------------------------------------------------------------------------------   Imaging Results:    DG Chest 1 View  Result Date: 03/14/2020 CLINICAL DATA:  Cough EXAM: CHEST  1 VIEW COMPARISON:  06/29/2018 FINDINGS: Cardiomegaly with indistinct pulmonary vasculature favoring pulmonary venous hypertension. No overt edema. No blunting of the costophrenic angles. Atherosclerotic calcification of the aortic arch. Prominent degenerative glenohumeral arthropathy bilaterally. Thoracic spondylosis. Airway thickening is present, suggesting bronchitis or reactive airways disease. IMPRESSION: 1. Airway thickening is present, suggesting bronchitis or reactive airways disease. 2. Cardiomegaly with pulmonary venous hypertension. No overt edema. 3. Prominent degenerative glenohumeral arthropathy bilaterally. 4. Thoracic spondylosis. Electronically Signed   By: Gaylyn Rong M.D.   On: 03/14/2020 14:06   CT Head Wo Contrast  Result  Date: 03/14/2020 CLINICAL DATA:  Encephalopathy. EXAM: CT HEAD WITHOUT CONTRAST TECHNIQUE: Contiguous axial images were obtained from the base of the skull through the vertex without intravenous contrast. COMPARISON:  None. FINDINGS: Brain: Mild diffuse cortical atrophy is noted. Mild chronic ischemic white matter disease is noted. No mass effect or midline shift is noted. Ventricular size is within normal limits. There is no evidence of mass lesion, hemorrhage or acute infarction. Vascular: No hyperdense vessel or unexpected calcification. Skull: Normal. Negative for fracture or focal lesion. Sinuses/Orbits: No acute finding. Other: None. IMPRESSION: Mild diffuse cortical atrophy. Mild chronic ischemic white matter disease. No acute intracranial abnormality seen. Electronically Signed   By: Lupita Raider M.D.   On: 03/14/2020 13:47    My personal review of EKG: Rhythm A Fib ,QTc 578   Assessment & Plan:    Active Problems:   Morbid obesity (HCC)   Essential hypertension   Chronic diastolic congestive heart failure (HCC)   Lower urinary tract infectious disease   Anemia   Debility   OSA (obstructive sleep apnea)   UTI (urinary tract infection)   UTI -Continue with IV Rocephin, follow-up urine culture  Head Twitching/dystonia -I have discussed with neurology at [facility], none of the medication she is taking contribute to her dystonia, given its mild, and improving daughter, this can be followed as an outpatient, she is following with [facility]  neurology in [place]. - will consult PT and OT  Prolonged QTC -Potassium is 4.1, will check magnesium, will give 1 g of magnesium empirically meanwhile, monitor on telemetry, repeat EKG in a.m., I will hold trazodone and Zoloft as both can contribute to prolonged QTC.  Paroxysmal A. Fib -Patient appears with new diagnosis of A. fib, patient discussed at length with PCP who knows the patient very well for several years, patient was  not started on full anticoagulation given interaction with carbamazepine, and decision has been made for to hold on anticoagulation to continue with baby aspirin. -I have offered to start full anticoagulation during hospital stay, but patient daughter rather following this issue with patient's PCP, and to not make any changes during this hospital stay.  Chronic diastolic CHF -Appears to be a euvolemic, continue with home dose Lasix.  Hypothyroidism -New with home dose Synthroid  Hypertension -Patient is  not on any antihypertensive medication, overall blood pressure usually controlled.  Neuropathic pain -Continue with carbamazepine, daughter reported it was started a year ago after severe nerve pain after tooth extraction, and dose was increased couple months ago as pain was uncontrolled.   DVT Prophylaxis Heparin  AM Labs Ordered, also please review Full Orders  Family Communication: Admission, patients condition and plan of care including tests being ordered have been discussed with the patient and daughter who indicate understanding and agree with the plan and Code Status.  Code Status DNR confirmed per daughter  Likely DC to  Home  Condition GUARDED    Consults called: none  Admission status: Observation  Time spent in minutes : 60 minutes   Huey Bienenstock M.D on 03/14/2020 at 6:15 PM  Between 7am to 7pm - Pager - [phone]. After 7pm go to [email or web address] - password [email or web address]  [facility] - Office  [phone]

## 2020-03-14 NOTE — ED Triage Notes (Signed)
Pt brought in by [facility] due to AMS. Family felt pt wasn't normal this am and noticed involuntary head movement. Pt has colostomy and foley cath. Pt oriented to person, place and DOB

## 2020-03-15 DIAGNOSIS — I130 Hypertensive heart and chronic kidney disease with heart failure and stage 1 through stage 4 chronic kidney disease, or unspecified chronic kidney disease: Secondary | ICD-10-CM | POA: Diagnosis not present

## 2020-03-15 DIAGNOSIS — N390 Urinary tract infection, site not specified: Secondary | ICD-10-CM | POA: Diagnosis not present

## 2020-03-15 DIAGNOSIS — I5033 Acute on chronic diastolic (congestive) heart failure: Secondary | ICD-10-CM | POA: Diagnosis not present

## 2020-03-15 DIAGNOSIS — G4733 Obstructive sleep apnea (adult) (pediatric): Secondary | ICD-10-CM

## 2020-03-15 DIAGNOSIS — I5032 Chronic diastolic (congestive) heart failure: Secondary | ICD-10-CM | POA: Diagnosis not present

## 2020-03-15 DIAGNOSIS — G9340 Encephalopathy, unspecified: Secondary | ICD-10-CM

## 2020-03-15 DIAGNOSIS — I10 Essential (primary) hypertension: Secondary | ICD-10-CM | POA: Diagnosis not present

## 2020-03-15 DIAGNOSIS — R9431 Abnormal electrocardiogram [ECG] [EKG]: Secondary | ICD-10-CM

## 2020-03-15 DIAGNOSIS — T83511A Infection and inflammatory reaction due to indwelling urethral catheter, initial encounter: Secondary | ICD-10-CM | POA: Diagnosis not present

## 2020-03-15 DIAGNOSIS — N1832 Chronic kidney disease, stage 3b: Secondary | ICD-10-CM

## 2020-03-15 LAB — COMPREHENSIVE METABOLIC PANEL
ALT: 13 U/L (ref 0–44)
AST: 14 U/L — ABNORMAL LOW (ref 15–41)
Albumin: 3.1 g/dL — ABNORMAL LOW (ref 3.5–5.0)
Alkaline Phosphatase: 88 U/L (ref 38–126)
Anion gap: 11 (ref 5–15)
BUN: 41 mg/dL — ABNORMAL HIGH (ref 8–23)
CO2: 26 mmol/L (ref 22–32)
Calcium: 8.5 mg/dL — ABNORMAL LOW (ref 8.9–10.3)
Chloride: 102 mmol/L (ref 98–111)
Creatinine, Ser: 1.21 mg/dL — ABNORMAL HIGH (ref 0.44–1.00)
GFR calc Af Amer: 46 mL/min — ABNORMAL LOW (ref >60)
GFR calc non Af Amer: 39 mL/min — ABNORMAL LOW (ref >60)
Glucose, Bld: 106 mg/dL — ABNORMAL HIGH (ref 70–99)
Potassium: 3.8 mmol/L (ref 3.5–5.1)
Sodium: 139 mmol/L (ref 135–145)
Total Bilirubin: 0.5 mg/dL (ref 0.3–1.2)
Total Protein: 5.7 g/dL — ABNORMAL LOW (ref 6.5–8.1)

## 2020-03-15 LAB — CBC
HCT: 35.1 % — ABNORMAL LOW (ref 36.0–46.0)
Hemoglobin: 11.3 g/dL — ABNORMAL LOW (ref 12.0–15.0)
MCH: 31.7 pg (ref 26.0–34.0)
MCHC: 32.2 g/dL (ref 30.0–36.0)
MCV: 98.6 fL (ref 80.0–100.0)
Platelets: 138 10*3/uL — ABNORMAL LOW (ref 150–400)
RBC: 3.56 MIL/uL — ABNORMAL LOW (ref 3.87–5.11)
RDW: 14.6 % (ref 11.5–15.5)
WBC: 9.5 10*3/uL (ref 4.0–10.5)
nRBC: 0 % (ref 0.0–0.2)

## 2020-03-15 LAB — MAGNESIUM: Magnesium: 2.3 mg/dL (ref 1.7–2.4)

## 2020-03-15 MED ORDER — TRAZODONE HCL 50 MG PO TABS: 25.0000 mg | ORAL_TABLET | Freq: Every evening | ORAL | Status: AC | PRN

## 2020-03-15 MED ORDER — CEFDINIR 300 MG PO CAPS: 300.0000 mg | ORAL_CAPSULE | Freq: Two times a day (BID) | ORAL | 0 refills | Status: AC

## 2020-03-15 MED ORDER — SERTRALINE HCL 50 MG PO TABS: 25.0000 mg | ORAL_TABLET | Freq: Every day | ORAL | Status: AC

## 2020-03-15 NOTE — Progress Notes (Signed)
Nsg Discharge Note  Admit Date:  03/14/2020 Discharge date: 03/15/2020   [name] to be D/C'd Home per MD order.  AVS completed.  Copy for chart, and copy for patient signed, and dated. Patient/caregiver able to verbalize understanding.  Discharge Medication: Allergies as of 03/15/2020      Reactions   Morphine And Related Hives, Itching   Tramadol Other (See Comments)   vertigo   Ciprofloxacin Itching, Rash   Codeine Anxiety   Very agitated then unable to arouse   Gabapentin Other (See Comments)   Alters mood   Lipitor [atorvastatin] Itching, Rash   Lyrica [pregabalin] Other (See Comments)   Lethargic, mood changes   Motrin [ibuprofen] Anxiety   Agitated    Sulfa Antibiotics Rash   Tylenol [acetaminophen]       Medication List    TAKE these medications   albuterol 108 (90 Base) MCG/ACT inhaler Commonly known as: VENTOLIN HFA Inhale 2 puffs into the lungs every 6 (six) hours as needed for wheezing or shortness of breath.   allopurinol 100 MG tablet Commonly known as: ZYLOPRIM Take 1 tablet (100 mg total) by mouth daily. What changed: when to take this   ALPRAZolam 0.5 MG tablet Commonly known as: XANAX Take 0.5 mg by mouth at bedtime.   aspirin EC 81 MG tablet Take 81 mg by mouth daily.   BENEFIBER DRINK MIX PO Take by mouth.   carbamazepine 300 MG 12 hr capsule Commonly known as: CARBATROL Take 1 capsule (300 mg total) by mouth 2 (two) times daily.   cefdinir 300 MG capsule Commonly known as: OMNICEF Take 1 capsule (300 mg total) by mouth 2 (two) times daily for 3 days.   furosemide 40 MG tablet Commonly known as: LASIX Take 1 tablet (40 mg total) by mouth daily. What changed: when to take this   guaiFENesin 600 MG 12 hr tablet Commonly known as: MUCINEX Take by mouth 2 (two) times daily.   levothyroxine 150 MCG tablet Commonly known as: SYNTHROID Take 150 mcg by mouth daily.   multivitamin tablet Take 1 tablet by mouth daily.    nitroGLYCERIN 0.4 MG SL tablet Commonly known as: NITROSTAT   omeprazole 40 MG capsule Commonly known as: PRILOSEC Take 1 capsule (40 mg total) by mouth daily. What changed: when to take this   sertraline 50 MG tablet Commonly known as: ZOLOFT Take 0.5 tablets (25 mg total) by mouth daily. What changed: how much to take   traZODone 50 MG tablet Commonly known as: DESYREL Take 0.5 tablets (25 mg total) by mouth at bedtime as needed for sleep. What changed:   how much to take  when to take this  reasons to take this       Discharge Assessment: Vitals:   03/15/20 0259 03/15/20 0547  BP: 135/63 126/69  Pulse: 61 (!) 48  Resp: 16 16  Temp: 98.3 F (36.8 C) (!) 97.5 F (36.4 C)  SpO2: 94% 94%   Skin clean, dry and intact without evidence of skin break down, no evidence of skin tears noted. IV catheter discontinued intact. Site without signs and symptoms of complications - no redness or edema noted at insertion site, patient denies c/o pain - only slight tenderness at site.  Dressing with slight pressure applied.  D/c Instructions-Education: Discharge instructions given to patient/family with verbalized understanding. D/c education completed with patient/family including follow up instructions, medication list, d/c activities limitations if indicated, with other d/c instructions as indicated by MD -  patient able to verbalize understanding, all questions fully answered. Patient instructed to return to ED, call 911, or call MD for any changes in condition.  Patient escorted via [place], and D/C home via private auto.  Zenaida Deed, RN 03/15/2020 3:33 PM

## 2020-03-15 NOTE — Discharge Summary (Signed)
Physician Discharge Summary  [name] BZJ:[id number] DOB: 02-Oct-1928 DOA: 03/14/2020  PCP: Kirby Funk, MD  Admit date: 03/14/2020 Discharge date: 03/15/2020  Time spent: 35 minutes  Recommendations for Outpatient Follow-up:  1. Repeat basic metabolic panel to follow electrolytes and renal function 2. Outpatient follow-up with neurology service recommended.   Discharge Diagnoses:  Active Problems:   Morbid obesity (HCC)   Essential hypertension   Chronic diastolic congestive heart failure (HCC)   Lower urinary tract infectious disease   Anemia   Debility   OSA (obstructive sleep apnea)   UTI (urinary tract infection)   Encephalopathy acute   Prolonged Q-T interval on ECG   Discharge Condition: Stable and improved.  Patient discharged home with instruction to follow-up with PCP in 10 days; and to follow-up with neurology service as an outpatient.  CODE STATUS: DNR  Diet recommendation: Heart healthy diet.  Filed Weights   03/14/20 1253  Weight: 98.9 kg    History of present illness:  As per H&P written by Dr. Randol Kern on 03/14/2020 86 y.o. female, with past medical history of hypertension, GERD, suprapubic catheter and colostomy secondary to chronic sacral pressure ulcer which healed, she presents to ED secondary to altered mental status, and neck twitching/involuntary movement noticed by her daughter this morning, patient with no loss of consciousness, no evidence of seizures, currently mentation back at baseline, no fever, no chills, she denies any chest pain, no dyspnea, he does report some cough, there is no new medication she has been taking, patient recently had her suprapubic catheter changed, as well she had IM steroids chart in her right hip ended to chronic arthritis, as well she was seen recently by her PCP where she was found to have new onset A. fib, and decision has been made not to start anticoagulation given she is chronically on carbamazepine. - In ED her  work-up was significant for abnormal urine analysis, patient does report she feels some spasms and discomfort, she had mild leukocytosis at 11, CT head findings, chest x-ray with no evidence of infection, patient was started on Rocephin for UTI, mentation is back at baseline, I was called to admit for further evaluation.  Hospital Course:  1-acute indwelling catheter associated UTI -Abnormal UA and some dysuria reported on admission. -Treated with IV antibiotics and discharged home with instruction to complete 3 more days management. -Patient advised to maintain adequate hydration -Outpatient follow-up with PCP in 10 days. -Patient with prior history of recurrent infections in the setting of indwelling catheter usage.  2-head twitching/dystonia and jerking's -No seizure activity -Continue outpatient follow-up with neurology service -Continue adjusted dose of psychotropic agents.  3-postherpetic neuralgia -Continue the use of carbamazepine -Reports pain control at current dose. -Consider outpatient follow-up with neurology service.  4-chronic paroxysmal atrial fibrillation -Continue patient follow-up with PCP and cardiology service -Continue aspirin for secondary prevention  5-chronic diastolic CHF -Patient appears euvolemic and is stable -Resume home Lasix dosages -Advised to follow low-sodium diet and to check weight on daily basis.  6-hypothyroidism -Continue current home dose Synthroid.  7-essential hypertension -Overall stable and well-controlled -Continue to follow heart healthy/low-sodium diet.  8-prolonged QT  -Dose of medications that can prolong QT has been adjusted -No other normalities appreciated on telemetry.  9-chronic renal failure stage IIIb -Advised to maintain adequate hydration -Continue to be mindful and judicious about nephrotoxic agents -Repeat basic metabolic panel to follow renal function and stability.   Procedures:  See below for x-ray  reports.  Consultations:  None  Discharge Exam: Vitals:   03/15/20 0259 03/15/20 0547  BP: 135/63 126/69  Pulse: 61 (!) 48  Resp: 16 16  Temp: 98.3 F (36.8 C) (!) 97.5 F (36.4 C)  SpO2: 94% 94%    General: Afebrile, no chest pain, no nausea, no vomiting.  Feeling better and per family member at bedside with mentation back to baseline.  Patient reports feeling slightly weak, but in not acute distress and ready to go home. Cardiovascular: No rubs, no gallops, unable to properly assess JVD with body habitus.  Rate controlled. Respiratory: Clear to auscultation bilaterally; no using accessory muscles; good oxygen saturation on room air. Abdomen: Colostomy in place without signs of superimposed infection.  Positive bowel sounds bilaterally, chronic suprapubic indwelling catheter.  No guarding.  No tenderness. Extremities: No cyanosis, no clubbing, no edema.  Discharge Instructions   Discharge Instructions    (HEART FAILURE PATIENTS) Call MD:  Anytime you have any of the following symptoms: 1) 3 pound weight gain in 24 hours or 5 pounds in 1 week 2) shortness of breath, with or without a dry hacking cough 3) swelling in the hands, feet or stomach 4) if you have to sleep on extra pillows at night in order to breathe.   Complete by: As directed    Diet - low sodium heart healthy   Complete by: As directed    Discharge instructions   Complete by: As directed    Maintain adequate hydration (1/2 your weight in ounces of water daily) Take medication as prescribed Arrange follow-up with PCP in 10 days Follow heart healthy diet check your weight on daily basis.     Allergies as of 03/15/2020      Reactions   Morphine And Related Hives, Itching   Tramadol Other (See Comments)   vertigo   Ciprofloxacin Itching, Rash   Codeine Anxiety   Very agitated then unable to arouse   Gabapentin Other (See Comments)   Alters mood   Lipitor [atorvastatin] Itching, Rash   Lyrica [pregabalin]  Other (See Comments)   Lethargic, mood changes   Motrin [ibuprofen] Anxiety   Agitated    Sulfa Antibiotics Rash   Tylenol [acetaminophen]       Medication List    TAKE these medications   albuterol 108 (90 Base) MCG/ACT inhaler Commonly known as: VENTOLIN HFA Inhale 2 puffs into the lungs every 6 (six) hours as needed for wheezing or shortness of breath.   allopurinol 100 MG tablet Commonly known as: ZYLOPRIM Take 1 tablet (100 mg total) by mouth daily. What changed: when to take this   ALPRAZolam 0.5 MG tablet Commonly known as: XANAX Take 0.5 mg by mouth at bedtime.   aspirin EC 81 MG tablet Take 81 mg by mouth daily.   BENEFIBER DRINK MIX PO Take by mouth.   carbamazepine 300 MG 12 hr capsule Commonly known as: CARBATROL Take 1 capsule (300 mg total) by mouth 2 (two) times daily.   cefdinir 300 MG capsule Commonly known as: OMNICEF Take 1 capsule (300 mg total) by mouth 2 (two) times daily for 3 days.   furosemide 40 MG tablet Commonly known as: LASIX Take 1 tablet (40 mg total) by mouth daily. What changed: when to take this   guaiFENesin 600 MG 12 hr tablet Commonly known as: MUCINEX Take by mouth 2 (two) times daily.   levothyroxine 150 MCG tablet Commonly known as: SYNTHROID Take 150 mcg by mouth daily.   multivitamin tablet Take 1  tablet by mouth daily.   nitroGLYCERIN 0.4 MG SL tablet Commonly known as: NITROSTAT   omeprazole 40 MG capsule Commonly known as: PRILOSEC Take 1 capsule (40 mg total) by mouth daily. What changed: when to take this   sertraline 50 MG tablet Commonly known as: ZOLOFT Take 0.5 tablets (25 mg total) by mouth daily. What changed: how much to take   traZODone 50 MG tablet Commonly known as: DESYREL Take 0.5 tablets (25 mg total) by mouth at bedtime as needed for sleep. What changed:   how much to take  when to take this  reasons to take this      Allergies  Allergen Reactions  . Morphine And Related  Hives and Itching  . Tramadol Other (See Comments)    vertigo  . Ciprofloxacin Itching and Rash  . Codeine Anxiety    Very agitated then unable to arouse  . Gabapentin Other (See Comments)    Alters mood  . Lipitor [Atorvastatin] Itching and Rash  . Lyrica [Pregabalin] Other (See Comments)    Lethargic, mood changes  . Motrin [Ibuprofen] Anxiety    Agitated   . Sulfa Antibiotics Rash  . Tylenol [Acetaminophen]    Follow-up Information    Lavone Orn, MD. Schedule an appointment as soon as possible for a visit in 10 day(s).   Specialty: Internal Medicine Contact information: [address], [address] [place] [address] [phone]           The results of significant diagnostics from this hospitalization (including imaging, microbiology, ancillary and laboratory) are listed below for reference.    Significant Diagnostic Studies: DG Chest 1 View  Result Date: 03/14/2020 CLINICAL DATA:  Cough EXAM: CHEST  1 VIEW COMPARISON:  06/29/2018 FINDINGS: Cardiomegaly with indistinct pulmonary vasculature favoring pulmonary venous hypertension. No overt edema. No blunting of the costophrenic angles. Atherosclerotic calcification of the aortic arch. Prominent degenerative glenohumeral arthropathy bilaterally. Thoracic spondylosis. Airway thickening is present, suggesting bronchitis or reactive airways disease. IMPRESSION: 1. Airway thickening is present, suggesting bronchitis or reactive airways disease. 2. Cardiomegaly with pulmonary venous hypertension. No overt edema. 3. Prominent degenerative glenohumeral arthropathy bilaterally. 4. Thoracic spondylosis. Electronically Signed   By: Van Clines M.D.   On: 03/14/2020 14:06   CT Head Wo Contrast  Result Date: 03/14/2020 CLINICAL DATA:  Encephalopathy. EXAM: CT HEAD WITHOUT CONTRAST TECHNIQUE: Contiguous axial images were obtained from the base of the skull through the vertex without intravenous contrast. COMPARISON:   None. FINDINGS: Brain: Mild diffuse cortical atrophy is noted. Mild chronic ischemic white matter disease is noted. No mass effect or midline shift is noted. Ventricular size is within normal limits. There is no evidence of mass lesion, hemorrhage or acute infarction. Vascular: No hyperdense vessel or unexpected calcification. Skull: Normal. Negative for fracture or focal lesion. Sinuses/Orbits: No acute finding. Other: None. IMPRESSION: Mild diffuse cortical atrophy. Mild chronic ischemic white matter disease. No acute intracranial abnormality seen. Electronically Signed   By: Marijo Conception M.D.   On: 03/14/2020 13:47    Microbiology: Recent Results (from the past 240 hour(s))  Respiratory Panel by RT PCR (Flu A&B, Covid) - Nasopharyngeal Swab     Status: None   Collection Time: 03/14/20  1:11 PM   Specimen: Nasopharyngeal Swab  Result Value Ref Range Status   SARS Coronavirus 2 by RT PCR NEGATIVE NEGATIVE Final    Comment: (NOTE) SARS-CoV-2 target nucleic acids are NOT DETECTED. The SARS-CoV-2 RNA is generally detectable in upper respiratoy  specimens during the acute phase of infection. The lowest concentration of SARS-CoV-2 viral copies this assay can detect is 131 copies/mL. A negative result does not preclude SARS-Cov-2 infection and should not be used as the sole basis for treatment or other patient management decisions. A negative result may occur with  improper specimen collection/handling, submission of specimen other than nasopharyngeal swab, presence of viral mutation(s) within the areas targeted by this assay, and inadequate number of viral copies (<131 copies/mL). A negative result must be combined with clinical observations, patient history, and epidemiological information. The expected result is Negative. Fact Sheet for Patients:  https://www.moore.com/ Fact Sheet for Healthcare Providers:  https://www.young.biz/ This test is not yet  ap proved or cleared by the [place] FDA and  has been authorized for detection and/or diagnosis of SARS-CoV-2 by FDA under an Emergency Use Authorization (EUA). This EUA will remain  in effect (meaning this test can be used) for the duration of the COVID-19 declaration under Section 564(b)(1) of the Act, 21 U.S.C. section 360bbb-3(b)(1), unless the authorization is terminated or revoked sooner.    Influenza A by PCR NEGATIVE NEGATIVE Final   Influenza B by PCR NEGATIVE NEGATIVE Final    Comment: (NOTE) The Xpert Xpress SARS-CoV-2/FLU/RSV assay is intended as an aid in  the diagnosis of influenza from Nasopharyngeal swab specimens and  should not be used as a sole basis for treatment. Nasal washings and  aspirates are unacceptable for Xpert Xpress SARS-CoV-2/FLU/RSV  testing. Fact Sheet for Patients: https://www.moore.com/ Fact Sheet for Healthcare Providers: https://www.young.biz/ This test is not yet approved or cleared by the [place] FDA and  has been authorized for detection and/or diagnosis of SARS-CoV-2 by  FDA under an Emergency Use Authorization (EUA). This EUA will remain  in effect (meaning this test can be used) for the duration of the  Covid-19 declaration under Section 564(b)(1) of the Act, 21  U.S.C. section 360bbb-3(b)(1), unless the authorization is  terminated or revoked. Performed at [facility], [address], [place], [place] [address]      Labs: Basic Metabolic Panel: Recent Labs  Lab 03/14/20 1314 03/15/20 0808  NA 138 139  K 4.1 3.8  CL 101 102  CO2 26 26  GLUCOSE 118* 106*  BUN 48* 41*  CREATININE 1.39* 1.21*  CALCIUM 8.9 8.5*  MG 2.2 2.3   Liver Function Tests: Recent Labs  Lab 03/14/20 1314 03/15/20 0808  AST 16 14*  ALT 13 13  ALKPHOS 111 88  BILITOT 0.6 0.5  PROT 6.6 5.7*  ALBUMIN 3.8 3.1*   Recent Labs  Lab 03/14/20 1314  LIPASE 34   Recent Labs  Lab 03/14/20 1314  AMMONIA  12   CBC: Recent Labs  Lab 03/14/20 1314 03/15/20 0808  WBC 11.0* 9.5  NEUTROABS 7.0  --   HGB 12.8 11.3*  HCT 39.1 35.1*  MCV 98.7 98.6  PLT 148* 138*   Signed:  Vassie Loll MD.  [facility] 03/15/2020, 1:25 PM

## 2020-03-17 LAB — URINE CULTURE: Culture: >=100000 — AB

## 2020-04-03 ENCOUNTER — Other Ambulatory Visit: Payer: Self-pay

## 2020-04-03 ENCOUNTER — Ambulatory Visit (INDEPENDENT_AMBULATORY_CARE_PROVIDER_SITE_OTHER): Payer: Medicare Other

## 2020-04-03 DIAGNOSIS — R339 Retention of urine, unspecified: Secondary | ICD-10-CM

## 2020-04-03 NOTE — Progress Notes (Signed)
Suprapubic Cath Change  Patient is present today for a suprapubic catheter change due to urinary retention.  56ml of water was drained from the balloon, a 16FR foley cath was removed from the tract with out difficulty.  Site was cleaned and prepped in a sterile fashion with betadine.  A 16FR foley cath was replaced into the tract no complications were noted. Urine return was noted, 10 ml of sterile water was inflated into the balloon and a bedside bag was attached for drainage.  Patient tolerated well.  Preformed by: Jenin Birdsall LPN   Follow up: 1 month sp cath change

## 2020-05-01 ENCOUNTER — Other Ambulatory Visit: Payer: Self-pay

## 2020-05-01 ENCOUNTER — Ambulatory Visit (INDEPENDENT_AMBULATORY_CARE_PROVIDER_SITE_OTHER): Payer: Medicare Other

## 2020-05-01 DIAGNOSIS — R339 Retention of urine, unspecified: Secondary | ICD-10-CM | POA: Diagnosis not present

## 2020-05-01 NOTE — Progress Notes (Signed)
Suprapubic Cath Change  Patient is present today for a suprapubic catheter change due to urinary retention.  93ml of water was drained from the balloon, a 16FR foley cath was removed from the tract with out difficulty.  Site was cleaned and prepped in a sterile fashion with betadine.  A 16FR foley cath was replaced into the tract no complications were noted. Urine return was noted, 10 ml of sterile water was inflated into the balloon and a bedside bag was attached for drainage.  Patient tolerated well.  Preformed by: Jonquil Stubbe LPN  Follow up: 1 month cath change

## 2020-05-29 ENCOUNTER — Other Ambulatory Visit: Payer: Self-pay

## 2020-05-29 ENCOUNTER — Ambulatory Visit (INDEPENDENT_AMBULATORY_CARE_PROVIDER_SITE_OTHER): Payer: Medicare Other

## 2020-05-29 DIAGNOSIS — R339 Retention of urine, unspecified: Secondary | ICD-10-CM

## 2020-05-29 NOTE — Patient Instructions (Signed)

## 2020-05-29 NOTE — Progress Notes (Signed)
Suprapubic Cath Change  Patient is present today for a suprapubic catheter change due to urinary retention.  29ml of water was drained from the balloon, a 16FR foley cath was removed from the tract with out difficulty.  Site was cleaned and prepped in a sterile fashion with betadine.  A 16FR foley cath was replaced into the tract no complications were noted. Urine return was noted, 10 ml of sterile water was inflated into the balloon and a bedside bag was attached for drainage.  Patient tolerated well.    Preformed by: Milledge Gerding, LPN  Follow up: 1 month sp cath change

## 2020-06-26 ENCOUNTER — Other Ambulatory Visit: Payer: Self-pay

## 2020-06-26 ENCOUNTER — Ambulatory Visit (INDEPENDENT_AMBULATORY_CARE_PROVIDER_SITE_OTHER): Payer: Medicare Other

## 2020-06-26 DIAGNOSIS — R339 Retention of urine, unspecified: Secondary | ICD-10-CM

## 2020-06-26 NOTE — Progress Notes (Signed)
Suprapubic Cath Change  Patient is present today for a suprapubic catheter change due to urinary retention.  6ml of water was drained from the balloon, a 16FR foley cath was removed from the tract with out difficulty.  Site was cleaned and prepped in a sterile fashion with betadine.  A 16FR foley cath was replaced into the tract no complications were noted. Urine return was noted, 10 ml of sterile water was inflated into the balloon and a bed side bag was attached for drainage.  Patient tolerated well.  Performed by: Juletta Berhe,LPN  Follow up: 1 month SP cath change

## 2020-07-01 IMAGING — DX DG CHEST 1V PORT
1 series · 1 of 1 positions shown · non-contrast
Comparison: Radiograph 05/16/2016

CLINICAL DATA: Cough tonight.

EXAM:
PORTABLE CHEST 1 VIEW

[chest]
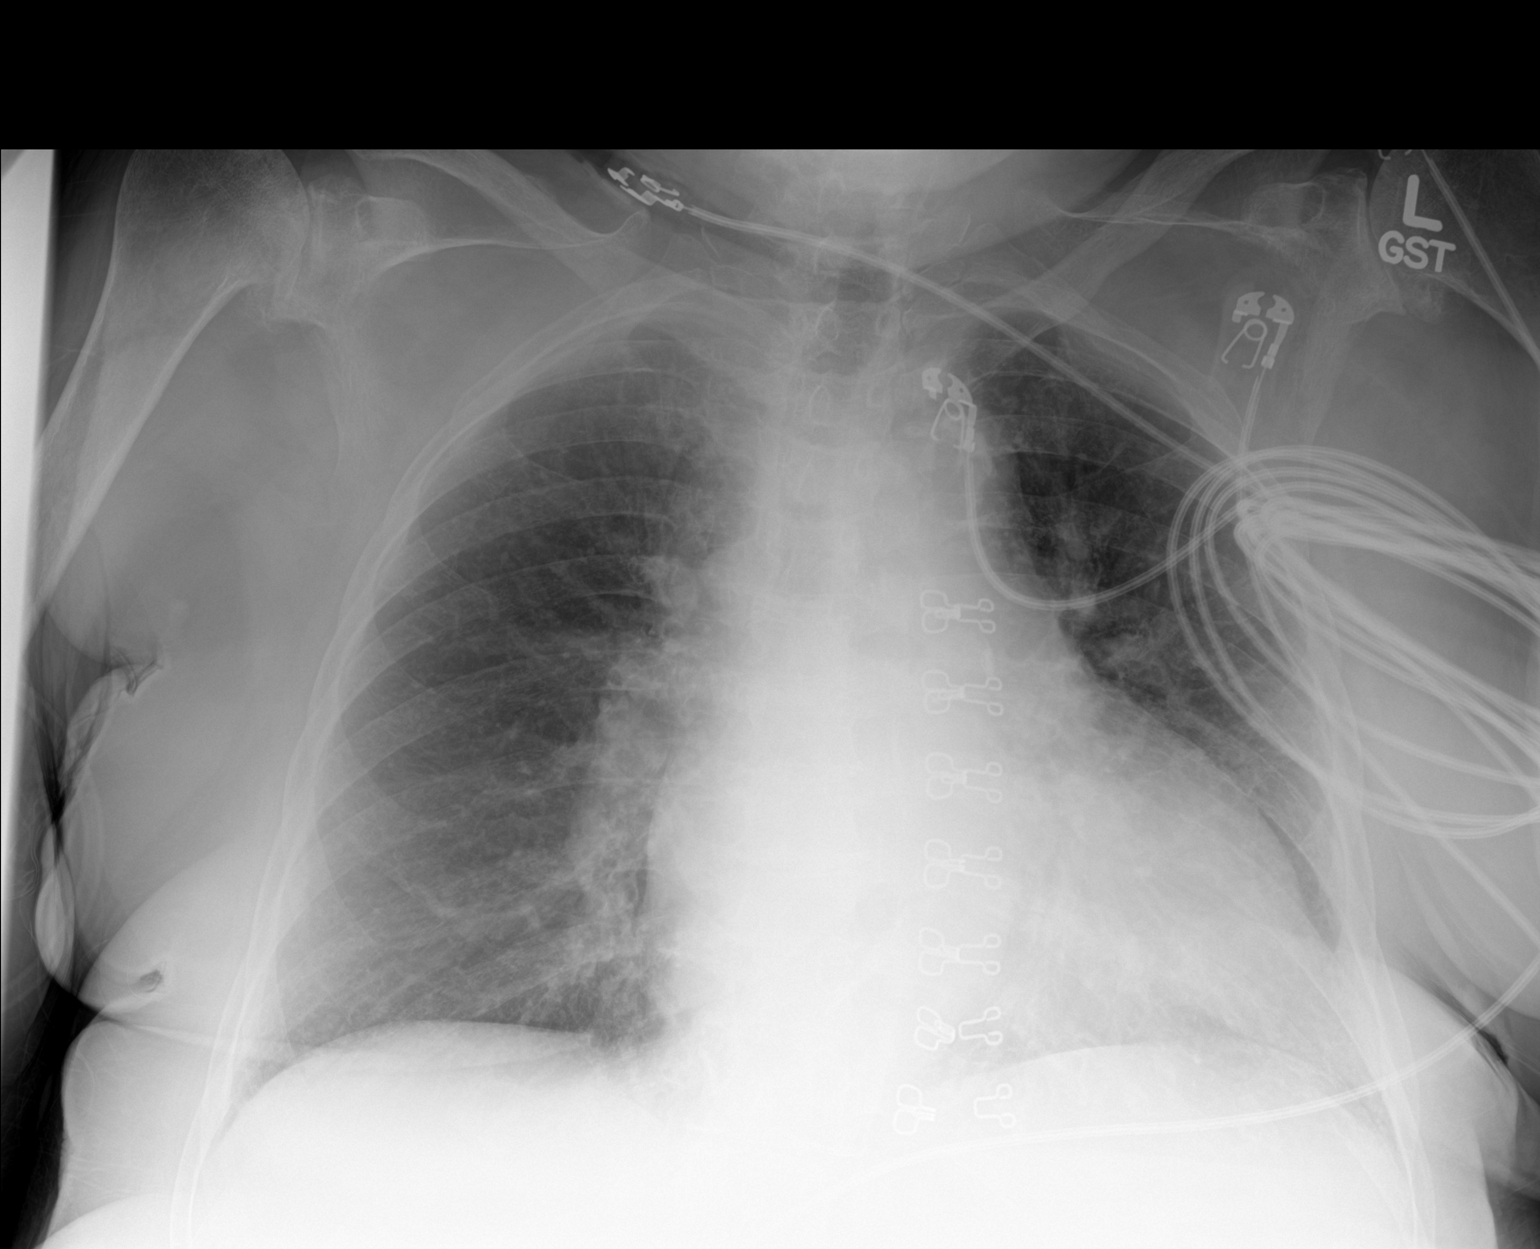

[1 of 1 positions shown; findings below may reference images not displayed]

FINDINGS: The heart is enlarged. Indistinct perihilar pulmonary vascularity.
No focal airspace disease. No pleural effusion or pneumothorax. No
acute osseous abnormalities are seen.
IMPRESSION: Cardiomegaly. Indistinct perihilar pulmonary vasculature suggesting
vascular congestion and or edema.

No pneumonia.

## 2020-07-03 IMAGING — DX DG CHEST 1V PORT
1 series · 1 of 1 positions shown · non-contrast
Comparison: One-view chest x-ray 06/27/2018.

CLINICAL DATA: Respiratory failure.

EXAM:
PORTABLE CHEST 1 VIEW

[chest]
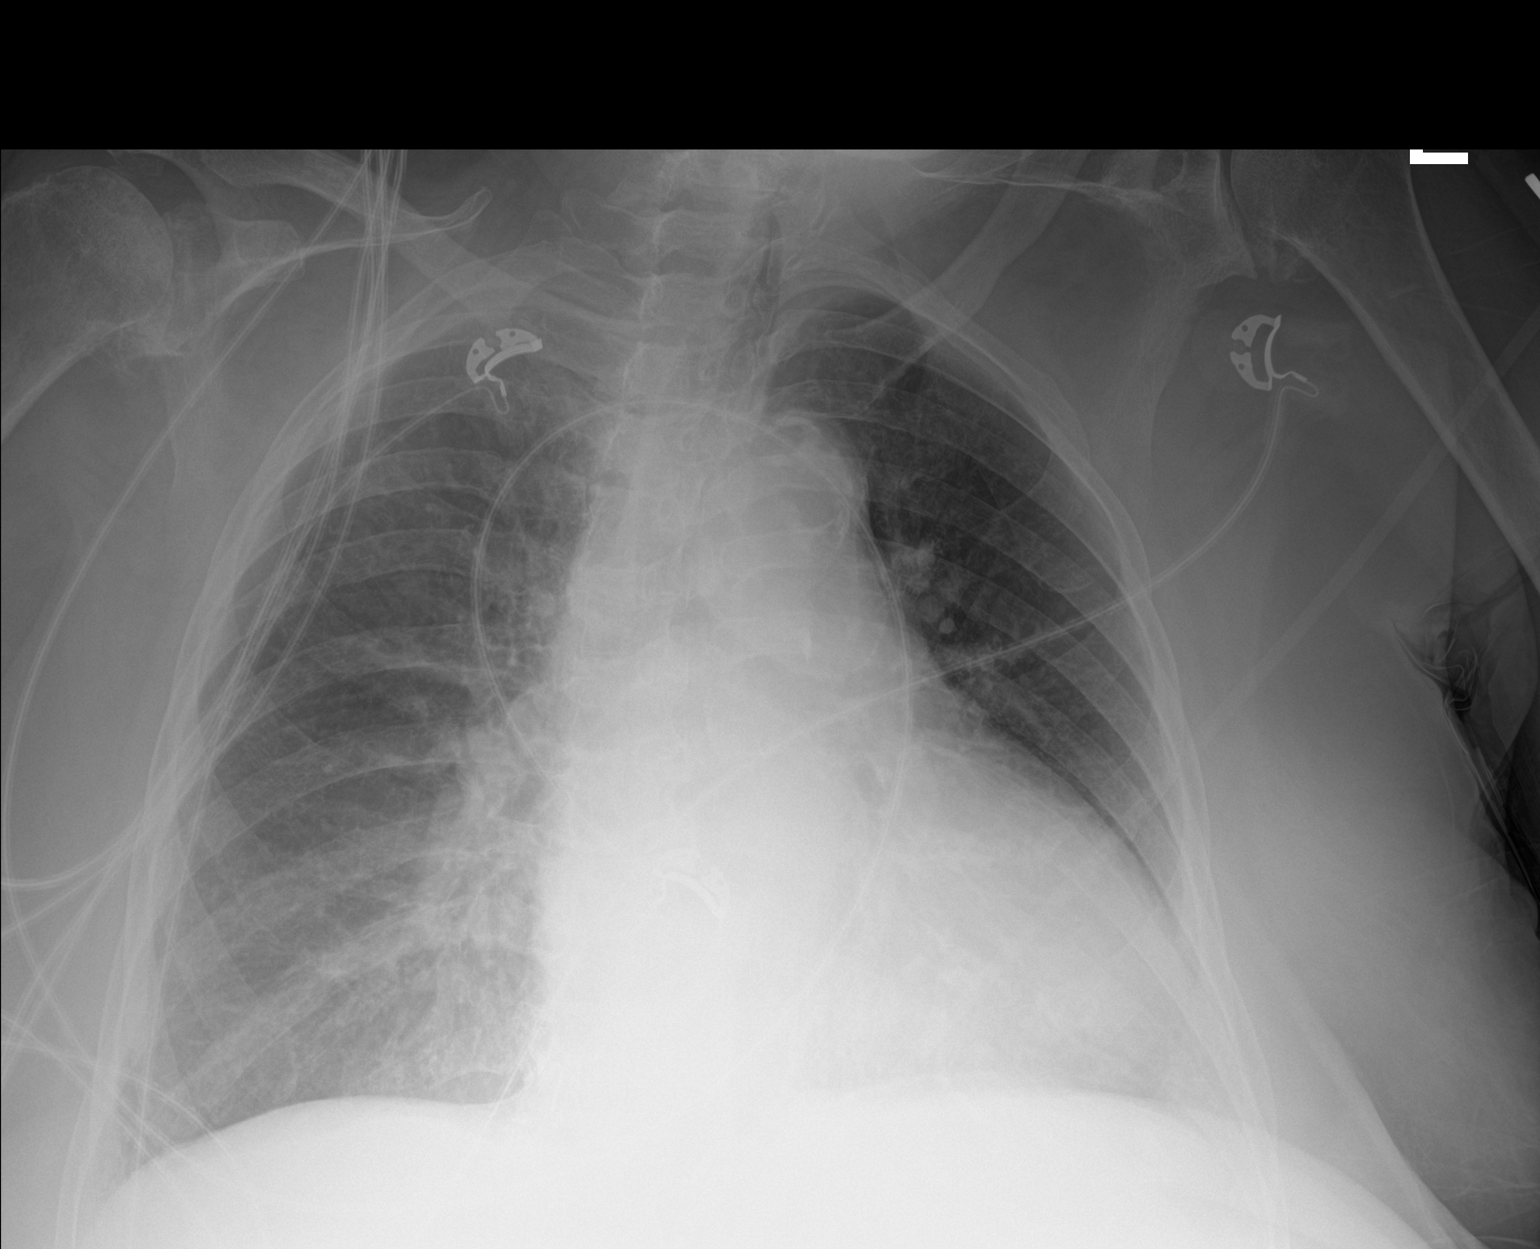

[1 of 1 positions shown; findings below may reference images not displayed]

FINDINGS: The heart is enlarged. Aortic atherosclerosis is present. There is
no edema or effusion. Pulmonary vascular congestion is stable. No
significant airspace consolidation is present.
IMPRESSION: 1. Stable cardiomegaly and mild pulmonary vascular congestion.
2. Aortic atherosclerosis.

## 2020-07-24 ENCOUNTER — Ambulatory Visit (INDEPENDENT_AMBULATORY_CARE_PROVIDER_SITE_OTHER): Payer: Medicare Other

## 2020-07-24 DIAGNOSIS — R339 Retention of urine, unspecified: Secondary | ICD-10-CM | POA: Diagnosis not present

## 2020-07-24 NOTE — Progress Notes (Signed)
Suprapubic Cath Change  Patient is present today for a suprapubic catheter change due to urinary retention.  52ml of water was drained from the balloon, a 16FR foley cath was removed from the tract with out difficulty.  Site was cleaned and prepped in a sterile fashion with betadine.  A 16FR foley cath was replaced into the tract no complications were noted. Urine return was noted, 10 ml of sterile water was inflated into the balloon and a bedside bag was attached for drainage.  Patient tolerated well.  Preformed by: Marshia Tropea,LPN  Follow up: 1 onth sp caht change

## 2020-08-05 ENCOUNTER — Ambulatory Visit: Payer: Medicare Other | Admitting: Urology

## 2020-08-21 ENCOUNTER — Ambulatory Visit: Payer: Medicare Other

## 2020-08-27 ENCOUNTER — Ambulatory Visit: Payer: Medicare Other | Admitting: Urology

## 2020-08-28 DEATH — deceased

## 2021-03-01 ENCOUNTER — Ambulatory Visit: Payer: Medicare Other | Admitting: Diagnostic Neuroimaging

## 2021-12-24 IMAGING — CT CT IMAGE GUIDED DRAINAGE BY PERCUTANEOUS CATHETER
2 of 3 series · 12 of 32 positions shown, 17 images · non-contrast
Comparison: CT abdomen pelvis-06/27/2018

INDICATION: History of urinary retention with previous suprapubic catheters,
most recently, dislodged. Patient is currently diverted with a
standard Foley catheter. Request made for placement of a new image
guided suprapubic catheter for urinary diversion purposes.

EXAM:
ULTRASOUND AND CT GUIDED SUPRAPUBIC CATHETER PLACEMENT

[Series 2: i-spiral 5.0 b31f · axial · 0.98mm/px · z∈[+1144,+1206]mm · 8 of 24 slices shown, 13 images (1 of 2)]
[im 3/24  soft-tissue]
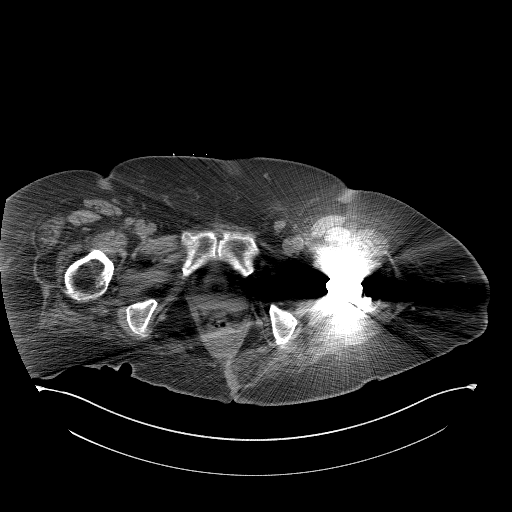
[im 3/24  bone]
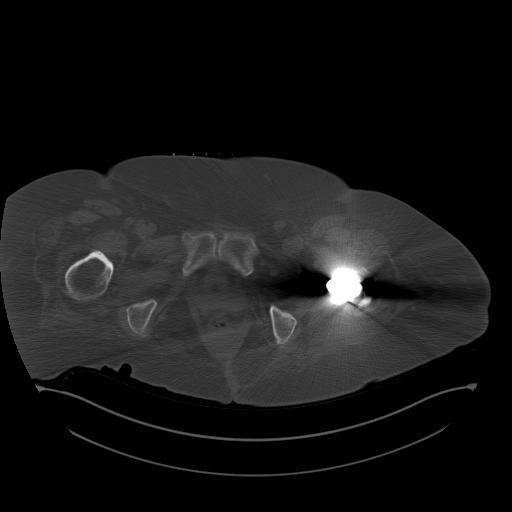
[im 6/24  soft-tissue]
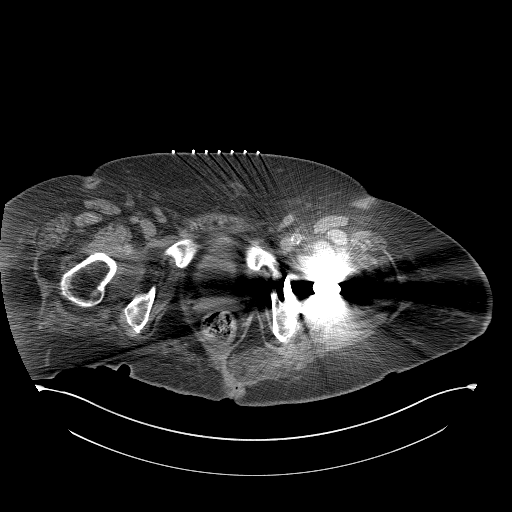
[im 8/24  soft-tissue]
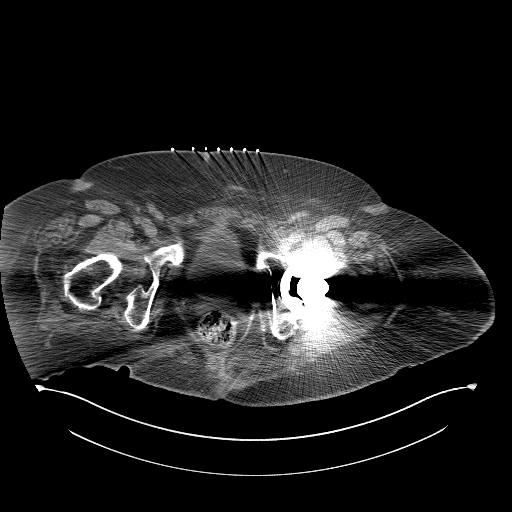
[im 11/24  soft-tissue]
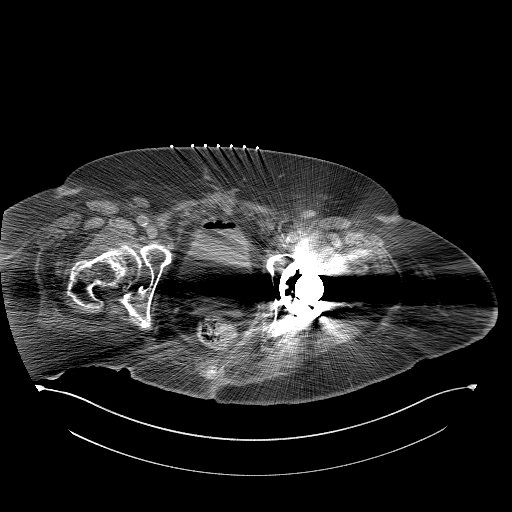
[im 13/24  soft-tissue]
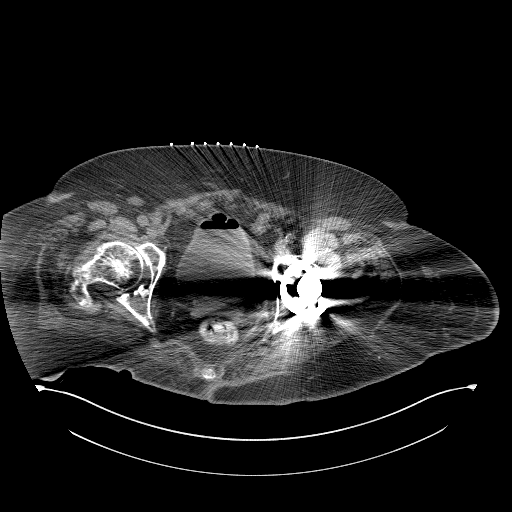
[im 13/24  lung]
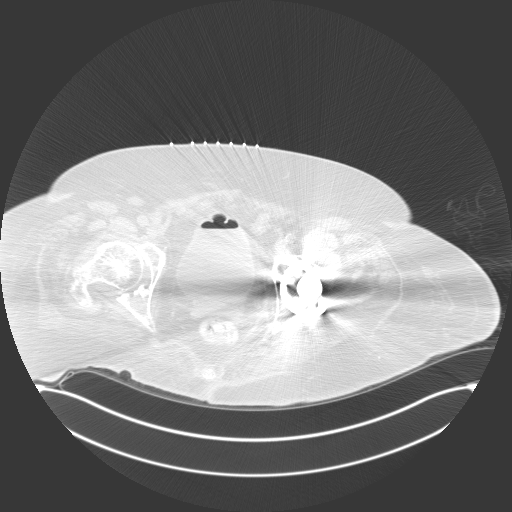
[im 16/24  soft-tissue]
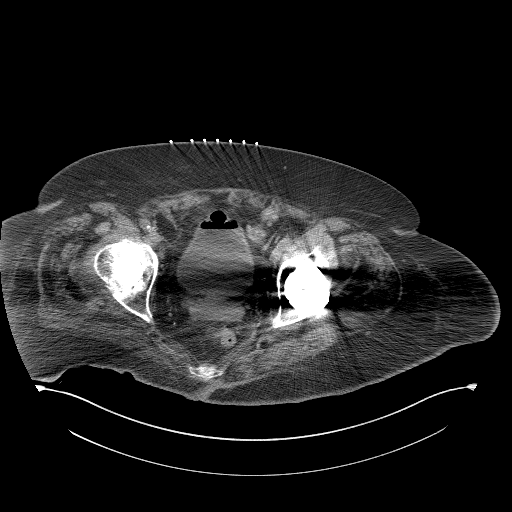
[im 16/24  lung]
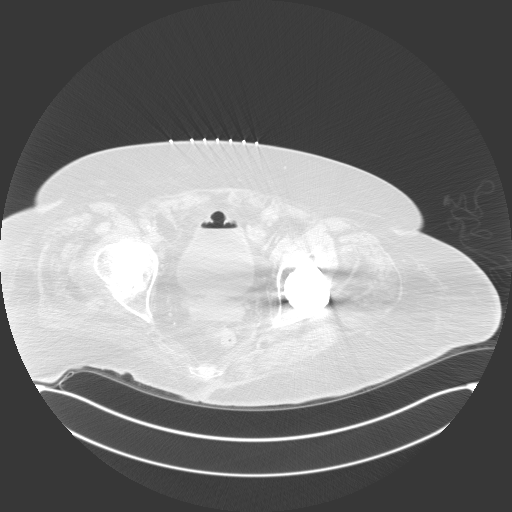
[im 18/24  soft-tissue]
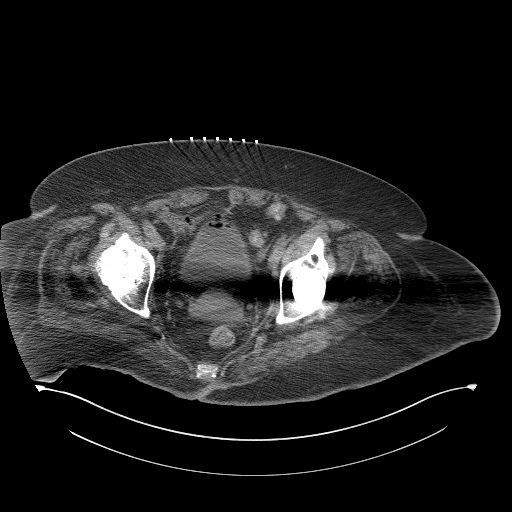
[im 18/24  lung]
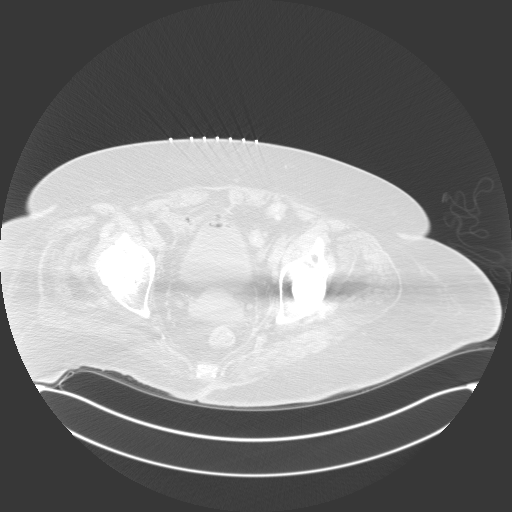
[im 21/24  soft-tissue]
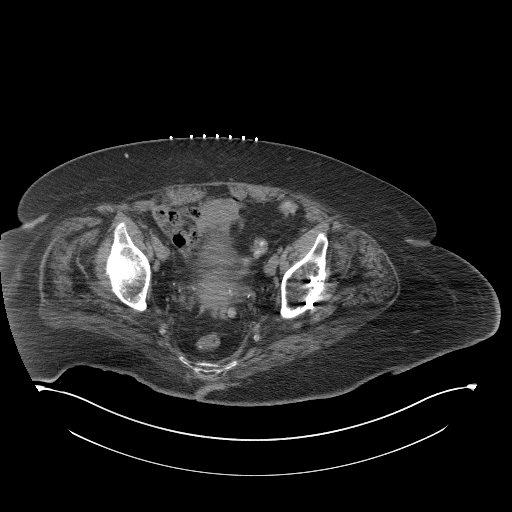
[im 21/24  lung]
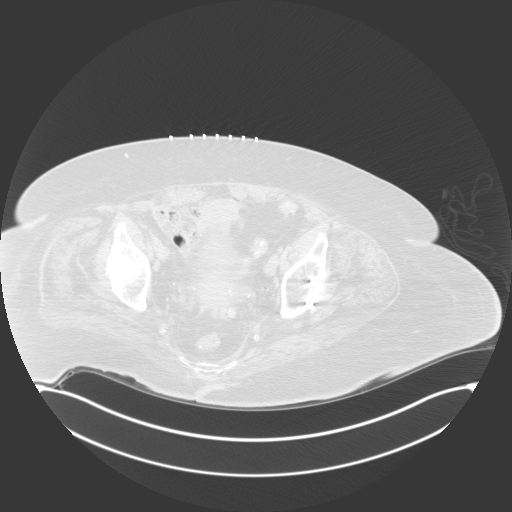

[Series 4: i-spiral 5.0 b31f · axial · 0.98mm/px · z∈[+1144,+1175]mm · 4 of 24 slices shown (2 of 2)]
[im 3/24  soft-tissue]
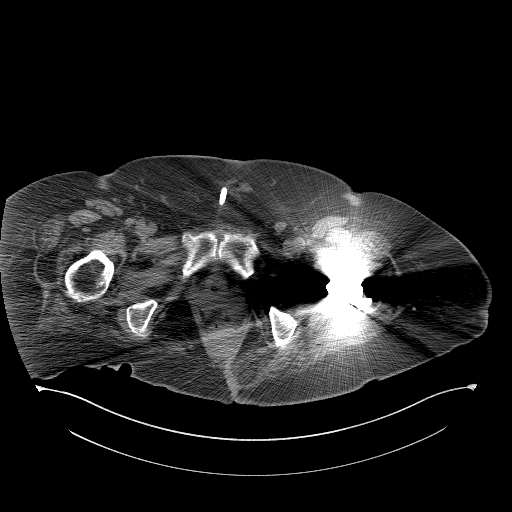
[im 5/24  soft-tissue]
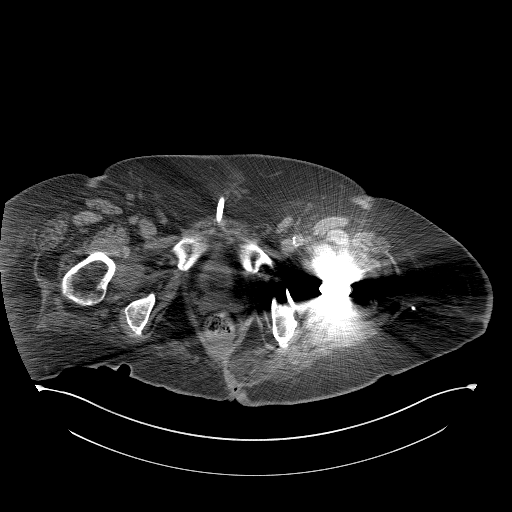
[im 7/24  soft-tissue]
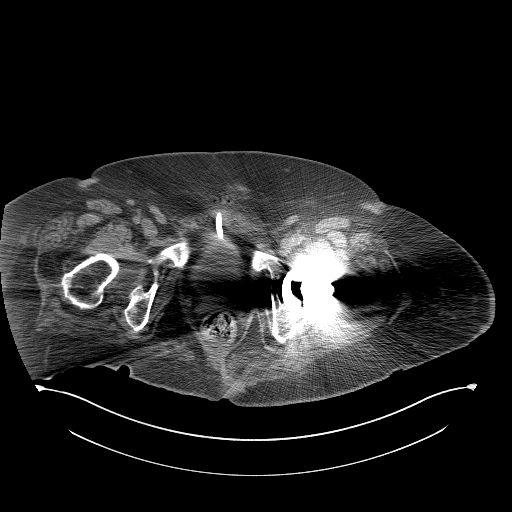
[im 12/24  soft-tissue]
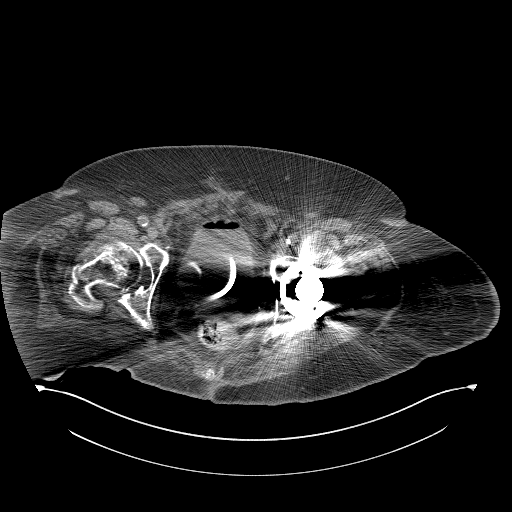

[12 of 32 positions shown; findings below may reference images not displayed]

MEDICATIONS:
Ciprofloxacin 400 mg IV; the antibiotic was administered with an
appropriate time frame prior to the initiation the procedure.

ANESTHESIA/SEDATION:
Moderate (conscious) sedation was employed during this procedure. A
total of Versed 2 mg and Fentanyl 100 mcg was administered
intravenously.

Moderate Sedation Time: 22 minutes. The patient's level of
consciousness and vital signs were monitored continuously by
radiology nursing throughout the procedure under my direct
supervision.

CONTRAST:  None

COMPLICATIONS:
None immediate.

PROCEDURE:
The procedure, risks, benefits, and alternatives were explained to
the patient. Questions regarding the procedure were encouraged and
answered. The patient understands and consents to the procedure. A
timeout was performed prior to the initiation of the procedure.

The patient was positioned supine on the CT gantry. Preprocedural
imaging was obtained of the lower pelvis. The skin overlying the
anterior aspect the lower pelvis was prepped and draped in usual
sterile fashion.

The patient's the urinary bladder was then distended with the
administration of approximately 250 cc of saline via the existing
Foley catheter.

Under direct ultrasound guidance, a 22 gauge needle was utilized for
the purposes of procedural planning after the overlying soft tissues
were anesthetized with 1% lidocaine. Appropriate trajectory was
confirmed with a limited lower abdominal/pelvic CT.

Next, an 18 gauge trocar needle was advanced into the urinary
bladder under direct ultrasound guidance. Ultrasound image was saved
for procedural documentation purposes. A short Amplatz wire was
coiled within the urinary bladder. Appropriate position was
confirmed with a limited pelvic CT.

The track was serially dilated ultimately allowing placement of a 14
French all-purpose drainage catheter with end coiled and locked
within the urinary bladder.

The catheter was connected to a gravity bag. The exit site of the
catheter was secured within interrupted suture. A dressing was
placed. The patient tolerated the procedure well without immediate
postprocedural complication.
FINDINGS: CT imaging demonstrates decompression of the urinary bladder via a
Foley catheter.

There is no bowel interposed between the anterior aspect of the
urinary bladder and the ventral lower abdominal/pelvic wall.

After distension of the urinary bladder with the administration of
saline via the Foley catheter, ultrasound and CT was utilized for
the placement of a 14 French percutaneous drainage catheter with end
coiled and locked within the urinary bladder.
IMPRESSION: Technically successful ultrasound and CT-guided placement of a 14
French suprapubic catheter.

PLAN:
Recommend fluoroscopic guided exchange and up sizing of this
suprapubic drainage catheter in approximately 6-8 weeks.
